# Patient Record
Sex: Female | Born: 1950 | Race: White | Hispanic: No | Smoking: Former smoker
Health system: Southern US, Community
[De-identification: ages and names within clinical notes are randomized; demographics above are authoritative.]

## PROBLEM LIST (undated history)

## (undated) DIAGNOSIS — IMO0001 Reserved for inherently not codable concepts without codable children: Secondary | ICD-10-CM

## (undated) DIAGNOSIS — N83201 Unspecified ovarian cyst, right side: Secondary | ICD-10-CM

## (undated) DIAGNOSIS — C7951 Secondary malignant neoplasm of bone: Secondary | ICD-10-CM

## (undated) DIAGNOSIS — S76019A Strain of muscle, fascia and tendon of unspecified hip, initial encounter: Secondary | ICD-10-CM

## (undated) DIAGNOSIS — K219 Gastro-esophageal reflux disease without esophagitis: Secondary | ICD-10-CM

## (undated) DIAGNOSIS — M1990 Unspecified osteoarthritis, unspecified site: Secondary | ICD-10-CM

## (undated) DIAGNOSIS — N83202 Unspecified ovarian cyst, left side: Secondary | ICD-10-CM

## (undated) DIAGNOSIS — C801 Malignant (primary) neoplasm, unspecified: Secondary | ICD-10-CM

## (undated) DIAGNOSIS — IMO0002 Reserved for concepts with insufficient information to code with codable children: Secondary | ICD-10-CM

## (undated) DIAGNOSIS — Z5111 Encounter for antineoplastic chemotherapy: Secondary | ICD-10-CM

## (undated) HISTORY — PX: TUBAL LIGATION: SHX77

## (undated) HISTORY — DX: Encounter for antineoplastic chemotherapy: Z51.11

## (undated) HISTORY — DX: Gastro-esophageal reflux disease without esophagitis: K21.9

## (undated) HISTORY — DX: Unspecified ovarian cyst, left side: N83.202

## (undated) HISTORY — DX: Unspecified ovarian cyst, right side: N83.201

## (undated) HISTORY — DX: Strain of muscle, fascia and tendon of unspecified hip, initial encounter: S76.019A

## (undated) HISTORY — DX: Unspecified osteoarthritis, unspecified site: M19.90

---

## 1998-11-27 ENCOUNTER — Other Ambulatory Visit: Admission: RE | Admit: 1998-11-27 | Discharge: 1998-11-27 | Payer: Self-pay | Admitting: Obstetrics & Gynecology

## 1999-09-19 ENCOUNTER — Other Ambulatory Visit: Admission: RE | Admit: 1999-09-19 | Discharge: 1999-09-19 | Payer: Self-pay | Admitting: Obstetrics & Gynecology

## 2000-09-24 ENCOUNTER — Other Ambulatory Visit: Admission: RE | Admit: 2000-09-24 | Discharge: 2000-09-24 | Payer: Self-pay | Admitting: Obstetrics & Gynecology

## 2001-09-29 ENCOUNTER — Other Ambulatory Visit: Admission: RE | Admit: 2001-09-29 | Discharge: 2001-09-29 | Payer: Self-pay | Admitting: Obstetrics & Gynecology

## 2002-03-23 ENCOUNTER — Encounter: Admission: RE | Admit: 2002-03-23 | Discharge: 2002-03-23 | Payer: Self-pay | Admitting: Rheumatology

## 2002-03-23 ENCOUNTER — Encounter: Payer: Self-pay | Admitting: Rheumatology

## 2002-04-15 HISTORY — PX: DILATION AND CURETTAGE OF UTERUS: SHX78

## 2002-11-02 ENCOUNTER — Other Ambulatory Visit: Admission: RE | Admit: 2002-11-02 | Discharge: 2002-11-02 | Payer: Self-pay | Admitting: Obstetrics and Gynecology

## 2002-11-09 ENCOUNTER — Encounter (INDEPENDENT_AMBULATORY_CARE_PROVIDER_SITE_OTHER): Payer: Self-pay | Admitting: *Deleted

## 2002-11-09 ENCOUNTER — Ambulatory Visit (HOSPITAL_COMMUNITY): Admission: RE | Admit: 2002-11-09 | Discharge: 2002-11-09 | Payer: Self-pay | Admitting: Obstetrics and Gynecology

## 2003-11-15 ENCOUNTER — Other Ambulatory Visit: Admission: RE | Admit: 2003-11-15 | Discharge: 2003-11-15 | Payer: Self-pay | Admitting: Obstetrics and Gynecology

## 2004-03-28 ENCOUNTER — Ambulatory Visit: Payer: Self-pay | Admitting: Gastroenterology

## 2004-04-05 ENCOUNTER — Ambulatory Visit: Payer: Self-pay | Admitting: Gastroenterology

## 2004-04-05 HISTORY — PX: COLONOSCOPY: SHX174

## 2005-02-19 ENCOUNTER — Other Ambulatory Visit: Admission: RE | Admit: 2005-02-19 | Discharge: 2005-02-19 | Payer: Self-pay | Admitting: Obstetrics and Gynecology

## 2006-03-18 ENCOUNTER — Other Ambulatory Visit: Admission: RE | Admit: 2006-03-18 | Discharge: 2006-03-18 | Payer: Self-pay | Admitting: Obstetrics & Gynecology

## 2007-03-27 ENCOUNTER — Other Ambulatory Visit: Admission: RE | Admit: 2007-03-27 | Discharge: 2007-03-27 | Payer: Self-pay | Admitting: Obstetrics and Gynecology

## 2007-06-09 ENCOUNTER — Encounter: Admission: RE | Admit: 2007-06-09 | Discharge: 2007-06-09 | Payer: Self-pay | Admitting: Family Medicine

## 2008-03-28 ENCOUNTER — Other Ambulatory Visit: Admission: RE | Admit: 2008-03-28 | Discharge: 2008-03-28 | Payer: Self-pay | Admitting: Obstetrics & Gynecology

## 2010-08-31 NOTE — Op Note (Signed)
   NAME:  Sherri Stafford, Sherri Stafford                             ACCOUNT NO.:  [id number]   MEDICAL RECORD NO.:  [id number]                   PATIENT TYPE:  AMB   LOCATION:  SDC                                  FACILITY:  [facility]   PHYSICIAN:  Cynthia P. Romine, M.D.             DATE OF BIRTH:  [birth year]   DATE OF PROCEDURE:  11/09/2002  DATE OF DISCHARGE:                                 OPERATIVE REPORT   PREOPERATIVE DIAGNOSIS:  Abnormal uterine bleeding with known endometrial  polyp.   POSTOPERATIVE DIAGNOSIS:  Abnormal uterine bleeding with known endometrial  polyp, pathology pending.   PROCEDURES:  1. Hysteroscopic resection of endometrial polyp.  2. Dilatation and curettage.   SURGEON:  Cynthia P. Romine, M.D.   ANESTHESIA:  General by mask.   ESTIMATED BLOOD LOSS:  Minimal.   SORBITOL DEFICIT:  90 mL.   COMPLICATIONS:  None.   PROCEDURE:  The patient was taken to the operating room and after the  induction of adequate general anesthesia by mask, she was placed in the  dorsal lithotomy position and was prepped and draped in the usual fashion.  The bladder was drained with a red rubber catheter.  The cervix was grasped  on the anterior lip with a single-tooth tenaculum.  A uterine sound was done  to 8 cm.  The cervix was dilated to a #25 Shawnie Pons.  The diagnostic scope was  introduced.  The endometrial polyp was seen and photographic documentation.  The diagnostic scope was removed, the cervix was dilated to a #31 Shawnie Pons, and  the operative scope was introduced.  Sorbitol was used as a distention  medium.  A single loop was used to remove the polyp with cautery.  It was  sent to pathology.  The scope was removed.  Sharp curettage was then carried  out and the specimen also sent to pathology.  The scope was reinserted.  The  endometrium appeared clean.  Photographic documentation was taken and the  procedure was terminated.  The instruments were removed from the vagina and  the patient was  taken in satisfactory condition to postanesthesia recovery.                                               Cynthia P. Romine, M.D.    CPR/MEDQ  D:  11/09/2002  T:  11/09/2002  Job:  [id number]

## 2010-09-11 ENCOUNTER — Encounter: Payer: Self-pay | Admitting: Gastroenterology

## 2010-10-08 ENCOUNTER — Telehealth: Payer: Self-pay | Admitting: Gastroenterology

## 2010-10-08 NOTE — Telephone Encounter (Signed)
Pt states that she had some abnormal liver functions on her labwork and that Dr. Tresa Res wanted her to be seen by Dr. Arlyce Dice. Have called Dr. Harlene Salts office and requested the labs. Waiting to hear back from their office.

## 2010-10-09 NOTE — Telephone Encounter (Signed)
Left message for pt to call back  °

## 2010-10-10 NOTE — Telephone Encounter (Signed)
Pt scheduled to see Dr. Arlyce Dice 11/05/10@10 :45am. Pt aware of appt date and time.

## 2010-11-05 ENCOUNTER — Encounter: Payer: Self-pay | Admitting: Gastroenterology

## 2010-11-05 ENCOUNTER — Ambulatory Visit (INDEPENDENT_AMBULATORY_CARE_PROVIDER_SITE_OTHER): Payer: BC Managed Care – PPO | Admitting: Gastroenterology

## 2010-11-05 DIAGNOSIS — R945 Abnormal results of liver function studies: Secondary | ICD-10-CM

## 2010-11-05 DIAGNOSIS — R109 Unspecified abdominal pain: Secondary | ICD-10-CM

## 2010-11-05 NOTE — Progress Notes (Signed)
History of Present Illness:  Sherri Stafford is a pleasant 60 year old white female referred at the request of Dr. Tresa Res for evaluation of abnormal liver tests. On routine testing in March and April, 2012 bilirubin was 1.5-2.0. In March the indirect was 1.2 with a total of 1.5. She has no history of liver disease including hepatitis or jaundice. She does not drink. Family history is negative for liver disease.    Review of Systems: Pertinent positive and negative review of systems were noted in the above HPI section. All other review of systems were otherwise negative.    Current Medications, Allergies, Past Medical History, Past Surgical History, Family History and Social History were reviewed in [employer] [facility] electronic medical record  Vital signs were reviewed in today's medical record. Physical Exam: General: Well developed , well nourished, no acute distress Head: Normocephalic and atraumatic Eyes:  sclerae anicteric, EOMI Ears: Normal auditory acuity Mouth: No deformity or lesions Lungs: Clear throughout to auscultation Heart: Regular rate and rhythm; no murmurs, rubs or bruits Abdomen: Soft, non tender and non distended. No masses, hepatosplenomegaly or hernias noted. Normal Bowel sounds Rectal:deferred Musculoskeletal: Symmetrical with no gross deformities  Pulses:  Normal pulses noted Extremities: No clubbing, cyanosis, edema or deformities noted Neurological: Alert oriented x 4, grossly nonfocal Psychological:  Alert and cooperative. Normal mood and affect

## 2010-11-05 NOTE — Assessment & Plan Note (Signed)
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

## 2010-11-05 NOTE — Patient Instructions (Signed)
Your Abdominal Ultrasound is scheduled on 11/07/2010 at 8:45am at [facility] Nothing to eat or drink 6 hours prior to test

## 2010-11-06 ENCOUNTER — Encounter: Payer: Self-pay | Admitting: Gastroenterology

## 2010-11-07 ENCOUNTER — Other Ambulatory Visit (HOSPITAL_COMMUNITY): Payer: BC Managed Care – PPO

## 2010-11-09 ENCOUNTER — Ambulatory Visit (HOSPITAL_COMMUNITY)
Admission: RE | Admit: 2010-11-09 | Discharge: 2010-11-09 | Disposition: A | Discharge status: Home / Self Care | Payer: BC Managed Care – PPO | Source: Ambulatory Visit | Attending: Gastroenterology | Admitting: Gastroenterology

## 2010-11-09 DIAGNOSIS — R109 Unspecified abdominal pain: Secondary | ICD-10-CM | POA: Insufficient documentation

## 2010-11-09 DIAGNOSIS — K824 Cholesterolosis of gallbladder: Secondary | ICD-10-CM | POA: Insufficient documentation

## 2010-11-12 ENCOUNTER — Telehealth: Payer: Self-pay | Admitting: Gastroenterology

## 2010-11-12 NOTE — Telephone Encounter (Signed)
Pt aware of results per Dr. Kaplan 

## 2010-11-12 NOTE — Telephone Encounter (Signed)
Message copied by Michele Mcalpine on Mon Nov 12, 2010  8:32 AM ------      Message from: Melvia Heaps MD D      Created: Sun Nov 11, 2010  5:50 PM       Inform pt that ultrasound does not show any significant findings.  No further w/u

## 2011-04-03 ENCOUNTER — Other Ambulatory Visit: Payer: Self-pay | Admitting: Otolaryngology

## 2011-04-03 DIAGNOSIS — R42 Dizziness and giddiness: Secondary | ICD-10-CM

## 2011-04-04 ENCOUNTER — Ambulatory Visit
Admission: RE | Admit: 2011-04-04 | Discharge: 2011-04-04 | Disposition: A | Discharge status: Home / Self Care | Payer: BC Managed Care – PPO | Source: Ambulatory Visit | Attending: Otolaryngology | Admitting: Otolaryngology

## 2011-04-04 DIAGNOSIS — R42 Dizziness and giddiness: Secondary | ICD-10-CM

## 2011-04-04 MED ORDER — GADOBENATE DIMEGLUMINE 529 MG/ML IV SOLN
12.0000 mL | Freq: Once | INTRAVENOUS | Status: AC | PRN
  Administered 2011-04-04: 12 mL via INTRAVENOUS

## 2011-10-07 ENCOUNTER — Telehealth: Payer: Self-pay | Admitting: Gastroenterology

## 2011-10-07 NOTE — Telephone Encounter (Signed)
Forward to Dr. Melvia Heaps for review on 10-07-11 ym

## 2013-05-25 ENCOUNTER — Telehealth: Payer: Self-pay | Admitting: Nurse Practitioner

## 2013-05-25 NOTE — Telephone Encounter (Signed)
LMTCB

## 2013-05-25 NOTE — Telephone Encounter (Addendum)
Dr. 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 What are your thoughts about having her come in today at 4 to see you? Her next Aex is 06-10-13. Paper chart on your desk.

## 2013-05-25 NOTE — Telephone Encounter (Signed)
Spoke with pt who is at work today. Pt noticed the first labial cyst about a week ago and was hoping "it would just go away." Pt noticed a second one last night. They appear to be "angry hard red knots." Pt has a history of sebaceous cysts on her face and trunk back in the 1980's that had to be cut out, and she thinks these are the same thing. No fever, no drainage so far, discomfort is a 4 or 5 out of 10. 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 Pt requesting appt on Friday if possible. Sched OV with TL 05-28-13 at 1 pm.

## 2013-05-25 NOTE — Telephone Encounter (Signed)
Pt states she has some sebaceous cysts on her labia that are hard and painful. She believes they may need to be cut out. 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

## 2013-05-28 ENCOUNTER — Ambulatory Visit (INDEPENDENT_AMBULATORY_CARE_PROVIDER_SITE_OTHER): Payer: BC Managed Care – PPO | Admitting: Gynecology

## 2013-05-28 ENCOUNTER — Encounter: Payer: Self-pay | Admitting: Gynecology

## 2013-05-28 VITALS — BP 149/88 | HR 90 | Resp 18 | Ht 65.0 in | Wt 159.0 lb

## 2013-05-28 DIAGNOSIS — N9489 Other specified conditions associated with female genital organs and menstrual cycle: Secondary | ICD-10-CM

## 2013-05-28 DIAGNOSIS — N907 Vulvar cyst: Secondary | ICD-10-CM

## 2013-05-28 MED ORDER — LIDOCAINE HCL 2 % EX GEL: 1.0000 "application " | CUTANEOUS | Status: DC | PRN

## 2013-05-28 NOTE — Patient Instructions (Addendum)
Sitz baths with cool water over the weekend TID Pat dry or blow dry Pelvic rest Lidocaine jelly as needed NSAIDS

## 2013-05-28 NOTE — Progress Notes (Signed)
Pt here reporting labial cysts that she first noticed 3w ago.  They are bothersome, get irritated when she wears pants.  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  Pt has a history of sebaceous cysts in other dermal areas and has had them removed from her face, forehead, chin, ear, and eye.  She is interested in having them removed.  ROS: per HPI BP 149/88  Pulse 90  Resp 18  Ht 5\' 5"  (1.651 m)  Wt 159 lb (72.122 kg)  BMI 26.46 kg/m2 General appearance: alert, cooperative and appears stated age   Procedure: After informed consent the labia minora on the right was cleansed with betadine x3, lidocaine jelly placed, then injected with 0.2cc 2% lidocaine.  Cyst stabilized medially and the skin incised, yellow sebaceous material extruded, the cyst wall was sharply removed and the skin closed with 4.0 vicryl interrupted.  The left labia was treated similarly.  The cyst was excised and skin closed. Pt tolerated well. She will do sitz baths at home, lidocaine jelly as needed, NSAIDS.   F/u 1w for suture removal No pathology

## 2013-06-01 ENCOUNTER — Encounter: Payer: Self-pay | Admitting: Gynecology

## 2013-06-01 ENCOUNTER — Ambulatory Visit (INDEPENDENT_AMBULATORY_CARE_PROVIDER_SITE_OTHER): Payer: BC Managed Care – PPO | Admitting: Gynecology

## 2013-06-01 VITALS — BP 126/84 | HR 88 | Resp 14 | Ht 65.0 in | Wt 158.0 lb

## 2013-06-01 DIAGNOSIS — N907 Vulvar cyst: Secondary | ICD-10-CM

## 2013-06-01 DIAGNOSIS — N9489 Other specified conditions associated with female genital organs and menstrual cycle: Secondary | ICD-10-CM

## 2013-06-01 DIAGNOSIS — Z4802 Encounter for removal of sutures: Secondary | ICD-10-CM

## 2013-06-01 NOTE — Progress Notes (Signed)
Pt here for suture removal.  Pt using sitz baths with relief, notes some itching at suture site.  No other complaints. ROS: per HPI PE: BP 126/84  Pulse 88  Resp 14  Ht 5\' 5"  (1.651 m)  Wt 158 lb (71.668 kg)  BMI 26.29 kg/m2 General appearance: alert, cooperative and appears stated age Pelvic: incision sites intact with suture, no induration or warmth, no residual cystic mass palpated  A/P: Sebaceous cysts removed Sutures removed, area treated with lidocaine jelly, sutres cut and removed intact, area nontender, no mass

## 2013-06-04 ENCOUNTER — Ambulatory Visit: Payer: BC Managed Care – PPO | Admitting: Gynecology

## 2013-06-09 ENCOUNTER — Telehealth: Payer: Self-pay | Admitting: Nurse Practitioner

## 2013-06-09 MED ORDER — ESTRADIOL-NORETHINDRONE ACET 1-0.5 MG PO TABS: ORAL_TABLET | ORAL | Status: DC

## 2013-06-09 NOTE — Telephone Encounter (Signed)
Due to inclement weather and AEX reschedule with Dr. Charlies Constable 06/10/13, patient requests a refill on her "hormone pills." (Patient does not remember the name of her RX.) Patient's AEX now scheduled for 07/12/13 and she needs refills to last until then.  "Use the same pharmacy as last time." Patient does not remember the name of her mail order pharmacy where she requests this refill be sent.

## 2013-06-09 NOTE — Telephone Encounter (Signed)
Last refilled: AEX 06/08/12 #90/3 refills by Ms.Patty AEX Scheduled: 07/12/13  Last Mammogram: 05/24/13 Bi-Rads 1  Activella [facility]90/[facility] to last patient until AEX  (she gets 90 days at a time)  LM on patient's VM that rx has been and if she has any issues she can give [place] a call.  Routed to provider, encounter closed.

## 2013-06-10 ENCOUNTER — Ambulatory Visit: Payer: Self-pay | Admitting: Nurse Practitioner

## 2013-06-11 MED ORDER — ESTRADIOL-NORETHINDRONE ACET 1-0.5 MG PO TABS: ORAL_TABLET | ORAL | Status: DC

## 2013-06-11 NOTE — Telephone Encounter (Signed)
Request received from [facility].  [facility] on 06/09/13. RX resent today to [facility].

## 2013-06-11 NOTE — Addendum Note (Signed)
Addended by: Graylon Good on: 06/11/2013 03:21 PM   Modules accepted: Orders

## 2013-06-13 DIAGNOSIS — S76019A Strain of muscle, fascia and tendon of unspecified hip, initial encounter: Secondary | ICD-10-CM

## 2013-06-13 HISTORY — DX: Strain of muscle, fascia and tendon of unspecified hip, initial encounter: S76.019A

## 2013-06-24 ENCOUNTER — Encounter: Payer: Self-pay | Admitting: Internal Medicine

## 2013-06-24 ENCOUNTER — Telehealth: Payer: Self-pay | Admitting: *Deleted

## 2013-06-24 ENCOUNTER — Ambulatory Visit (INDEPENDENT_AMBULATORY_CARE_PROVIDER_SITE_OTHER)
Admission: RE | Admit: 2013-06-24 | Discharge: 2013-06-24 | Disposition: A | Discharge status: Home / Self Care | Payer: BC Managed Care – PPO | Source: Ambulatory Visit | Attending: Internal Medicine | Admitting: Internal Medicine

## 2013-06-24 ENCOUNTER — Ambulatory Visit (INDEPENDENT_AMBULATORY_CARE_PROVIDER_SITE_OTHER): Payer: BC Managed Care – PPO | Admitting: Internal Medicine

## 2013-06-24 VITALS — BP 128/70 | HR 86 | Temp 98.4°F | Ht 63.5 in | Wt 160.6 lb

## 2013-06-24 DIAGNOSIS — R059 Cough, unspecified: Secondary | ICD-10-CM

## 2013-06-24 DIAGNOSIS — D869 Sarcoidosis, unspecified: Secondary | ICD-10-CM | POA: Insufficient documentation

## 2013-06-24 DIAGNOSIS — R05 Cough: Secondary | ICD-10-CM | POA: Insufficient documentation

## 2013-06-24 MED ORDER — ESOMEPRAZOLE MAGNESIUM 40 MG PO PACK: 40.0000 mg | PACK | Freq: Every day | ORAL | Status: DC

## 2013-06-24 MED ORDER — PREDNISONE 10 MG PO TABS: ORAL_TABLET | ORAL | Status: DC

## 2013-06-24 MED ORDER — PROMETHAZINE-CODEINE 6.25-10 MG/5ML PO SYRP: 5.0000 mL | ORAL_SOLUTION | ORAL | Status: DC | PRN

## 2013-06-24 NOTE — Telephone Encounter (Signed)
Aware, reviewed
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
## 2013-06-24 NOTE — Progress Notes (Signed)
   Subjective:    Patient ID: Sherri Stafford, female    DOB: 20-Jul-1950  MRN: [id number]  HPI   54 yowf retired Psychologist, prison and probation services quit smoking in 1986 dx of sarcoid clinically in 2004 assoc with arthritis no rash rx short course prednisone and no bx by Internist (doesn't remember name)  Referred  To pulmonary clinic for cough 06/24/2013 by Dr Redmond Baseman.    06/24/2013 1st [facility] office visit/ Wert cc "always get bronchitis" since childhood "they thought I had whooping coughs multiple times"  Typically required narcotic resolves p 3-4 weeks s prednisone or inhaler.  This episode  acute onset  X 5 weeks while sipping diet coke then became gradually worse and better p cough meds per Arelia Sneddon, seen by Dr Redmond Baseman 06/18/13 neg eval with dx of gerd  No obvious other patterns in day to day or daytime variabilty or assoc chronic cough or cp or chest tightness, subjective wheeze overt sinus or hb symptoms. No unusual exp hx or h/o childhood pna/ asthma or knowledge of premature birth.  Sleeping ok without nocturnal  or early am exacerbation  of respiratory  c/o's or need for noct saba. Also denies any obvious fluctuation of symptoms with weather or environmental changes or other aggravating or alleviating factors except as outlined above   Current Medications, Allergies, Complete Past Medical History, Past Surgical History, Family History, and Social History were reviewed in [employer] record.             Review of Systems  Constitutional: Negative for fever, chills and unexpected weight change.  HENT: Negative for congestion, dental problem, ear pain, nosebleeds, postnasal drip, rhinorrhea, sinus pressure, sneezing, sore throat, trouble swallowing and voice change.   Eyes: Negative for visual disturbance.  Respiratory: Positive for cough. Negative for choking and shortness of breath.   Cardiovascular: Negative for chest pain and leg swelling.  Gastrointestinal: Negative for  vomiting, abdominal pain and diarrhea.  Genitourinary: Negative for difficulty urinating.  Musculoskeletal: Negative for arthralgias.  Skin: Negative for rash.  Neurological: Negative for tremors, syncope and headaches.  Hematological: Does not bruise/bleed easily.       Objective:   Physical Exam  . Wt Readings from Last 3 Encounters:  06/24/13 160 lb 9.6 oz (72.848 kg)  06/01/13 158 lb (71.668 kg)  05/28/13 159 lb (72.122 kg)      HEENT: nl dentition, turbinates, and orophanx. Nl external ear canals without cough reflex   NECK :  without JVD/Nodes/TM/ nl carotid upstrokes bilaterally   LUNGS: no acc muscle use, clear to A and P bilaterally without cough on insp or exp maneuvers   CV:  RRR  no s3 or murmur or increase in P2, no edema   ABD:  soft and nontender with nl excursion in the supine position. No bruits or organomegaly, bowel sounds nl  MS:  warm without deformities, calf tenderness, cyanosis or clubbing  SKIN: warm and dry without lesions    NEURO:  alert, approp, no deficits    CXR  06/24/2013 :  1. Innumerable bilateral pulmonary parenchymal masses are present  worrisome for metastatic disease.  2. Mild hyperinflation is present consistent with COPD.        Assessment & Plan:

## 2013-06-24 NOTE — Assessment & Plan Note (Signed)
-   dx 2004 no bx/ assoc with arthritis, short course prednisone only   Most likely this is what we're seeing now on cxr but no baseline available so f/u in 6 weeks rec

## 2013-06-24 NOTE — Assessment & Plan Note (Signed)

## 2013-06-24 NOTE — Progress Notes (Signed)
Quick Note:  Spoke with the pt and notified of results/recs per MW She has questions about sarcoid and wants MW to call her tomorrow 05/28/13  Please call her, thanks ______

## 2013-06-24 NOTE — Patient Instructions (Addendum)
Please remember to go to the  x-ray department downstairs for your tests - we will call you with the results when they are available.  Prednisone 10 mg take  4 each am x 2 days,   2 each am x 2 days,  1 each am x 2 days and stop   The key to effective treatment for your cough is eliminating the non-stop cycle of cough you're stuck in long enough to let your airway heal completely and then see if there is anything still making you cough once you stop the cough suppression, but this should take no more than 5 days to figure out   Nexium  40 mg   Take 30-60 min before first meal of the day and Pepcid 20 mg one bedtime and chlortrimeton 4 mg at bedtime  until return to office - this is the best way to tell whether stomach acid is contributing to your problem.    GERD (REFLUX)  is an extremely common cause of respiratory symptoms, many times with no significant heartburn at all.    It can be treated with medication, but also with lifestyle changes including avoidance of late meals, excessive alcohol, smoking cessation, and avoid fatty foods, chocolate, peppermint, colas, red wine, and acidic juices such as orange juice.  NO MINT OR MENTHOL PRODUCTS SO NO COUGH DROPS  USE SUGARLESS CANDY INSTEAD (jolley ranchers or Stover's)  NO OIL BASED VITAMINS - use powdered substitutes.    Return in 2 weeks if not better

## 2013-06-28 ENCOUNTER — Telehealth: Payer: Self-pay | Admitting: Internal Medicine

## 2013-06-28 NOTE — Telephone Encounter (Signed)
Discussed with pt

## 2013-06-28 NOTE — Telephone Encounter (Signed)
Magda Paganini spoke with pt on 06/24/13 and advised of cxr results.  Pt requested to speak with Dr Melvyn Novas for further question.  Dr Melvyn Novas can you call pt?

## 2013-06-30 ENCOUNTER — Telehealth: Payer: Self-pay | Admitting: Gastroenterology

## 2013-06-30 ENCOUNTER — Telehealth: Payer: Self-pay | Admitting: Internal Medicine

## 2013-06-30 DIAGNOSIS — K219 Gastro-esophageal reflux disease without esophagitis: Secondary | ICD-10-CM

## 2013-06-30 MED ORDER — PROMETHAZINE-CODEINE 6.25-10 MG/5ML PO SYRP: 5.0000 mL | ORAL_SOLUTION | ORAL | Status: DC | PRN

## 2013-06-30 NOTE — Telephone Encounter (Signed)
She can try prn gaviscon but will need GI eval> ok to refre    - likely the hormone tablets are contributing to symptoms and may need to stop them.  - the phenergan with codeine should treat pain and cough both.

## 2013-06-30 NOTE — Telephone Encounter (Signed)
Spoke with pt - She states she is taking Nexium 30 minutes before breakfast and Pepcid at bedtime.  This was helping until this am and pt having increased reflux and feels like she is on fire.  Please advise

## 2013-06-30 NOTE — Telephone Encounter (Signed)
appt scheduled

## 2013-06-30 NOTE — Telephone Encounter (Signed)
Spoke with pt and advised of Dr Gustavus Bryant recommendations.  Pt will try Gaviscon otc.  Pt requests to see Dr Deatra Ina because she has seen him for other issues in the past.  Referral placed.  Pt also requests refill on Phenergan with Codeine .  Refill called in

## 2013-06-30 NOTE — Telephone Encounter (Signed)
Please schedule this pt with a midlevel next week.

## 2013-07-01 ENCOUNTER — Encounter: Payer: Self-pay | Admitting: Nurse Practitioner

## 2013-07-02 ENCOUNTER — Encounter: Payer: Self-pay | Admitting: *Deleted

## 2013-07-06 ENCOUNTER — Encounter: Payer: Self-pay | Admitting: Nurse Practitioner

## 2013-07-06 ENCOUNTER — Ambulatory Visit (INDEPENDENT_AMBULATORY_CARE_PROVIDER_SITE_OTHER): Payer: BC Managed Care – PPO | Admitting: Nurse Practitioner

## 2013-07-06 VITALS — BP 118/68 | HR 74 | Ht 63.5 in | Wt 156.4 lb

## 2013-07-06 DIAGNOSIS — R05 Cough: Secondary | ICD-10-CM

## 2013-07-06 DIAGNOSIS — R059 Cough, unspecified: Secondary | ICD-10-CM

## 2013-07-06 MED ORDER — PROMETHAZINE-CODEINE 6.25-10 MG/5ML PO SYRP: 5.0000 mL | ORAL_SOLUTION | Freq: Two times a day (BID) | ORAL | Status: DC

## 2013-07-06 MED ORDER — LORAZEPAM 0.5 MG PO TABS: 0.5000 mg | ORAL_TABLET | Freq: Two times a day (BID) | ORAL | Status: DC

## 2013-07-06 NOTE — Progress Notes (Signed)
HPI :   Patient is a 63 year old female known remotely to Dr. Deatra Ina. She is here for evaluation of cough felt to be related to GERD. In mid February patient" choked"  or a soda, since then she has had a dry cough. PCP referred her to ENT but according to the patient, the evaluation was unremarkable. It sounds like she had a laryngoscopy, no evidence for GERD or any other abnormalities found. The patient was instructed to take daily Nexium (she had taken a PPI only on an as-needed basis up until that point). She has also added in a Pepcid at bedtime. ENT referred patient to pulmonary care, she was seen by Dr. Melvyn Novas. Patient does have a history of sarcoidosis  Pulmonary gave her short course of prednisone. Patient has been taking cough syrup with codeine which significantly improves her dry cough. Patient sleeps with the head of her bed elevated. She is not really having much in the way of heartburn though some bone nodes to pulmonary a few days ago mention that Patient was having increased reflux and "on fire"     Past Medical History  Diagnosis Date  . Arthritis   . Bilateral ovarian cysts   . GERD (gastroesophageal reflux disease)     Family History  Problem Relation Age of Onset  . Brain cancer Father   . Pancreatic cancer Mother   . Emphysema Father     smoked   History  Substance Use Topics  . Smoking status: Former Smoker -- 1.00 packs/day for 14 years    Types: Cigarettes    Quit date: 04/15/1984  . Smokeless tobacco: Never Used  . Alcohol Use: No   Current Outpatient Prescriptions  Medication Sig Dispense Refill  . esomeprazole (NEXIUM) 40 MG packet Take 40 mg by mouth daily before breakfast.  30 each  12  . meloxicam (MOBIC) 15 MG tablet as needed.       . norethindrone-ethinyl estradiol (FEMHRT 1/5) 1-5 MG-MCG TABS Take 1 tablet by mouth daily.      . predniSONE (DELTASONE) 10 MG tablet Take  4 each am x 2 days,   2 each am x 2 days,  1 each am x 2 days and stop  14  tablet  0  . promethazine-codeine (PHENERGAN WITH CODEINE) 6.25-10 MG/5ML syrup Take 5 mLs by mouth 2 (two) times daily.       No current facility-administered medications for this visit.   Allergies  Allergen Reactions  . Ketoconazole   . Nystatin Hives  . Sporanox [Itraconazole] Hives   Review of Systems: All systems reviewed and negative except where noted in HPI.    Dg Chest 2 View  06/24/2013   CLINICAL DATA:  Five week history of cough.  EXAM: CHEST  2 VIEW  COMPARISON:  None.  FINDINGS: The lungs are well-expanded. There are multiple bilateral pulmonary parenchymal masses of varying sizes consistent with metastatic malignancy. The cardiac silhouette is normal in size. The pulmonary vascularity is not engorged. The mediastinum is normal in width. There is no pleural effusion. The observed portions of the bony thorax exhibit no acute abnormalities.  IMPRESSION: 1. Innumerable bilateral pulmonary parenchymal masses are present worrisome for metastatic disease. 2. Mild hyperinflation is present consistent with COPD. 3. There is no evidence of a pleural effusion or pneumothorax or CHF. 4. These results were called by telephone at the time of interpretation on 06/24/2013 at 11:07 AM to  Bing, RN, in Dr. Gustavus Bryant office, who verbally  acknowledged these results.   Electronically Signed   By: David  Martinique   On: 06/24/2013 11:09    Physical Exam: BP 118/68  Pulse 74  Ht 5' 3.5" (1.613 m)  Wt 156 lb 6.4 oz (70.943 kg)  BMI 27.27 kg/m2 Constitutional: Pleasant,well-developed, white female in no acute distress. HEENT: Normocephalic and atraumatic. Conjunctivae are normal. No scleral icterus. Neck supple.  Cardiovascular: Normal rate, regular rhythm.  Pulmonary/chest: Effort normal and breath sounds normal. No wheezing, rales or rhonchi. Abdominal: Soft, nondistended, nontender. Bowel sounds active throughout. There are no masses palpable. No hepatomegaly. Extremities: no  edema Lymphadenopathy: No cervical adenopathy noted. Neurological: Alert and oriented to person place and time. Skin: Skin is warm and dry. No rashes noted. Psychiatric: Normal mood and affect. Behavior is normal.   ASSESSMENT AND PLAN:   63 year old female with 6 week history of dry cough evaluated by ENT as well as pulmonary. Now here for workup of GERD as cause for cough. Patient PPI as well as Pepcid at bedtime.  She takes cough syrup with codeine which really helps cough. She has been under stress lately, wonders if dry cough may be a consequence of that . To help sort things out I have asked the patient to continue daily PPI and her H2 blocker but try and refrain from using the codeine cough syrup so cough will not be masked . If cough recurs then she will try a low-dose Ativan to see if there is an anxiety / stress component to the cough.  If cough persists despite these measures then may need pH study or possible Bravo. She will call with a condition update in a couple of weeks.

## 2013-07-06 NOTE — Patient Instructions (Signed)
Continue the Nexium 40 mg every morning. Continue the Pepcid at bedtime. We will fax the refill for the codeine cough syrup but try to hold it. If coughing returns take 1 tab of Ativen twice daily.   If coughing is better call us in 2-3 weeks with a condition update.

## 2013-07-07 ENCOUNTER — Encounter: Payer: Self-pay | Admitting: Nurse Practitioner

## 2013-07-12 ENCOUNTER — Ambulatory Visit (INDEPENDENT_AMBULATORY_CARE_PROVIDER_SITE_OTHER): Payer: BC Managed Care – PPO | Admitting: Nurse Practitioner

## 2013-07-12 ENCOUNTER — Encounter: Payer: Self-pay | Admitting: Nurse Practitioner

## 2013-07-12 VITALS — BP 100/64 | HR 88 | Ht 65.0 in | Wt 158.0 lb

## 2013-07-12 DIAGNOSIS — Z79890 Hormone replacement therapy (postmenopausal): Secondary | ICD-10-CM

## 2013-07-12 DIAGNOSIS — E559 Vitamin D deficiency, unspecified: Secondary | ICD-10-CM | POA: Insufficient documentation

## 2013-07-12 DIAGNOSIS — Z0000 Encounter for general adult medical examination without abnormal findings: Secondary | ICD-10-CM

## 2013-07-12 DIAGNOSIS — Z01419 Encounter for gynecological examination (general) (routine) without abnormal findings: Secondary | ICD-10-CM

## 2013-07-12 LAB — POCT URINALYSIS DIPSTICK
Bilirubin, UA: NEGATIVE
Blood, UA: NEGATIVE
GLUCOSE UA: NEGATIVE
KETONES UA: NEGATIVE
Leukocytes, UA: NEGATIVE
Nitrite, UA: NEGATIVE
Protein, UA: NEGATIVE
UROBILINOGEN UA: NEGATIVE
pH, UA: 5

## 2013-07-12 LAB — HEMOGLOBIN, FINGERSTICK: HEMOGLOBIN, FINGERSTICK: 14 g/dL (ref 12.0–16.0)

## 2013-07-12 MED ORDER — NORETHINDRONE-ETH ESTRADIOL 1-5 MG-MCG PO TABS: 1.0000 | ORAL_TABLET | Freq: Every day | ORAL | Status: DC

## 2013-07-12 NOTE — Patient Instructions (Signed)

## 2013-07-12 NOTE — Progress Notes (Signed)
Patient ID: Sherri Stafford, female   DOB: [birth year], 63 y.o.   MRN: [id number] 63 y.o. G0P0 Divorced Caucasian Fe here for annual exam.  Patient and ex husband are now back together.  They had divorced secondary to his ETOH abuse.  He has been sober for a year and they are trying to work things out.   She does have some vaginal dryness but able to use OTC lubrication.  She is on steroids currently for a chronic cough and is seeing a pulmonologist.  Patient's last menstrual period was 01/14/2008.          Sexually active: yes  The current method of family planning is post menopausal status.    Exercising: yes  cardio and weights Smoker:  no  Health Maintenance: Pap:  06/06/11, WNL, neg HR HPV MMG:  05/21/13, need additional images in 6 months Colonoscopy:  03/2004, repeat in 10 years BMD:  05/2011, -1.1/-1.8/-1.5 TDaP:  03/15/11 Labs:  HB: 14.0   Urine:  Negative     reports that she quit smoking about 29 years ago. Her smoking use included Cigarettes. She has a 14 pack-year smoking history. She has never used smokeless tobacco. She reports that she does not drink alcohol or use illicit drugs.  Past Medical History  Diagnosis Date  . Arthritis   . Bilateral ovarian cysts   . GERD (gastroesophageal reflux disease)   . Strain of hip flexor 06/2013    left side torn    Past Surgical History  Procedure Laterality Date  . Dilation and curettage of uterus  2004    x 3   . Tubal ligation    . Colonoscopy  04/05/2004    normal     Current Outpatient Prescriptions  Medication Sig Dispense Refill  . esomeprazole (NEXIUM) 40 MG packet Take 40 mg by mouth daily before breakfast.  30 each  12  . meloxicam (MOBIC) 15 MG tablet as needed.       . norethindrone-ethinyl estradiol (FEMHRT 1/5) 1-5 MG-MCG TABS Take 1 tablet by mouth daily.  90 tablet  3  . promethazine-codeine (PHENERGAN WITH CODEINE) 6.25-10 MG/5ML syrup Take 5 mLs by mouth 2 (two) times daily.  120 mL  0  . LORazepam (ATIVAN) 0.5 MG  tablet Take 1 tablet (0.5 mg total) by mouth 2 (two) times daily.  20 tablet  0   No current facility-administered medications for this visit.    Family History  Problem Relation Age of Onset  . Brain cancer Father   . Emphysema Father     smoked  . Pancreatic cancer Mother   . Colon cancer Neg Hx   . Breast cancer Paternal Aunt 56    ROS:  Pertinent items are noted in HPI.  Otherwise, a comprehensive ROS was negative.  Exam:   BP 100/64  Pulse 88  Ht 5\' 5"  (1.651 m)  Wt 158 lb (71.668 kg)  BMI 26.29 kg/m2  LMP 01/14/2008 Height: 5\' 5"  (165.1 cm) (with shoes, pt unable to take off due to hip injury)  Ht Readings from Last 3 Encounters:  07/12/13 5\' 5"  (1.651 m)  07/06/13 5' 3.5" (1.613 m)  06/24/13 5' 3.5" (1.613 m)    General appearance: alert, cooperative and appears stated age Head: Normocephalic, without obvious abnormality, atraumatic Neck: no adenopathy, supple, symmetrical, trachea midline and thyroid normal to inspection and palpation Lungs: clear to auscultation bilaterally Breasts: normal appearance, no masses or tenderness, no mass on right side, FCB changes Heart:  regular rate and rhythm Abdomen: soft, non-tender; no masses,  no organomegaly Extremities: extremities normal, atraumatic, no cyanosis or edema Skin: Skin color, texture, turgor normal. No rashes or lesions Lymph nodes: Cervical, supraclavicular, and axillary nodes normal. No abnormal inguinal nodes palpated Neurologic: Grossly normal   Pelvic: External genitalia:  no lesions              Urethra:  normal appearing urethra with no masses, tenderness or lesions              Bartholin's and Skene's: normal                 Vagina: normal appearing vagina with normal color and discharge, no lesions              Cervix: anteverted              Pap taken: yes Bimanual Exam:  Uterus:  normal size, contour, position, consistency, mobility, non-tender              Adnexa: no mass, fullness,  tenderness               Rectovaginal: Confirms               Anus:  normal sphincter tone, no lesions  A:  Well Woman with normal exam  Postmenopausal on HRT  History of chronic cough - now on steroids via Pulmonologist. Sarcoidosis  Abnormal Mammo most likely a benign node  P:   Pap smear as per guidelines   Mammogram due again in September with [place] on the right  Refill HRT FEMHRT for a year  Discussed risks of CVA, DVT, cancer, etc.  Also discussed results in relationship with this Mammo which is a node most likely benign-- she will continue monthly breast exams.  Recheck Vit D along with labs and follow  Counseled on breast self exam, mammography screening, adequate intake of calcium and vitamin D, diet and exercise return annually or prn  An After Visit Summary was printed and given to the patient.

## 2013-07-13 ENCOUNTER — Telehealth: Payer: Self-pay | Admitting: *Deleted

## 2013-07-13 LAB — COMPREHENSIVE METABOLIC PANEL
ALT: 8 U/L (ref 0–35)
AST: 10 U/L (ref 0–37)
Albumin: 3.7 g/dL (ref 3.5–5.2)
Alkaline Phosphatase: 51 U/L (ref 39–117)
BILIRUBIN TOTAL: 1.9 mg/dL — AB (ref 0.2–1.2)
BUN: 11 mg/dL (ref 6–23)
CO2: 25 mEq/L (ref 19–32)
CREATININE: 0.76 mg/dL (ref 0.50–1.10)
Calcium: 8.7 mg/dL (ref 8.4–10.5)
Chloride: 105 mEq/L (ref 96–112)
Glucose, Bld: 91 mg/dL (ref 70–99)
Potassium: 4.5 mEq/L (ref 3.5–5.3)
Sodium: 141 mEq/L (ref 135–145)
Total Protein: 6 g/dL (ref 6.0–8.3)

## 2013-07-13 LAB — TSH: TSH: 2.065 u[IU]/mL (ref 0.350–4.500)

## 2013-07-13 LAB — LIPID PANEL
CHOL/HDL RATIO: 3.1 ratio
CHOLESTEROL: 160 mg/dL (ref 0–200)
HDL: 52 mg/dL (ref >39)
LDL Cholesterol: 94 mg/dL (ref 0–99)
Triglycerides: 70 mg/dL (ref <150)
VLDL: 14 mg/dL (ref 0–40)

## 2013-07-13 LAB — VITAMIN D 25 HYDROXY (VIT D DEFICIENCY, FRACTURES): Vit D, 25-Hydroxy: 28 ng/mL — ABNORMAL LOW (ref 30–89)

## 2013-07-13 NOTE — Telephone Encounter (Signed)
Pt returning call

## 2013-07-13 NOTE — Progress Notes (Signed)
Encounter reviewed by Dr. Dartanyon Frankowski Silva.  

## 2013-07-13 NOTE — Telephone Encounter (Signed)
Message copied by Graylon Good on Tue Jul 13, 2013 11:55 AM ------      Message from: Kem Boroughs R      Created: Tue Jul 13, 2013  8:31 AM       Let patient know that lipid panel is normal, TSH normal.  Her VIt D this year is 7, last year at 58 - make sure that she takes about 1000 IU daily.  One of her liver test - Bilirubin is still up.  Last year ist was 1.7 and this year 1.9 (normal should be 0.2- 1.2)  She will need to follow with PCP. ------

## 2013-07-13 NOTE — Telephone Encounter (Signed)
I have attempted to contact this patient by phone with the following results: left message to return my call on answering machine (home/mobile per DPR).

## 2013-07-13 NOTE — Progress Notes (Signed)
Reviewed and agree with management. Mikeyla Music D. Shon Indelicato, M.D., FACG  

## 2013-07-14 LAB — IPS PAP TEST WITH HPV

## 2013-07-14 NOTE — Telephone Encounter (Signed)
Pt notified in result note.

## 2013-07-26 ENCOUNTER — Telehealth: Payer: Self-pay | Admitting: Nurse Practitioner

## 2013-07-26 ENCOUNTER — Telehealth: Payer: Self-pay | Admitting: *Deleted

## 2013-07-26 NOTE — Telephone Encounter (Signed)
Rob please see my office note on this patient. She has seen ENT and pulmonary without answer for her coughing. Do you want pH study / bravo?  Nothing helps cough but codeine which I refilled once for her but don't want to keep doing unless a GI evaluation is underway. Thanks, Nevin Bloodgood

## 2013-07-26 NOTE — Telephone Encounter (Signed)
Patient is calling again to see what she should do about her cough and if she needs a referral to some one else. Please, advise.

## 2013-07-26 NOTE — Telephone Encounter (Signed)
Patient wants to let Tye Savoy, NP know she is not any better. States the only thing that stops her cough is Codeine cough syrup. States she has seen her PCP also. She wants to know what to do next. Please, advise.

## 2013-07-27 ENCOUNTER — Ambulatory Visit (INDEPENDENT_AMBULATORY_CARE_PROVIDER_SITE_OTHER): Payer: BC Managed Care – PPO | Admitting: Internal Medicine

## 2013-07-27 ENCOUNTER — Telehealth: Payer: Self-pay | Admitting: Internal Medicine

## 2013-07-27 ENCOUNTER — Other Ambulatory Visit: Payer: Self-pay

## 2013-07-27 ENCOUNTER — Encounter: Payer: Self-pay | Admitting: Internal Medicine

## 2013-07-27 VITALS — BP 90/60 | HR 106 | Temp 98.4°F | Ht 63.5 in | Wt 159.0 lb

## 2013-07-27 DIAGNOSIS — R05 Cough: Secondary | ICD-10-CM

## 2013-07-27 DIAGNOSIS — D869 Sarcoidosis, unspecified: Secondary | ICD-10-CM

## 2013-07-27 DIAGNOSIS — K219 Gastro-esophageal reflux disease without esophagitis: Secondary | ICD-10-CM

## 2013-07-27 DIAGNOSIS — R059 Cough, unspecified: Secondary | ICD-10-CM

## 2013-07-27 MED ORDER — ESOMEPRAZOLE MAGNESIUM 40 MG PO CPDR: 40.0000 mg | DELAYED_RELEASE_CAPSULE | Freq: Every day | ORAL | Status: DC

## 2013-07-27 MED ORDER — PREDNISONE 10 MG PO TABS: ORAL_TABLET | ORAL | Status: DC

## 2013-07-27 MED ORDER — PROMETHAZINE-CODEINE 6.25-10 MG/5ML PO SYRP
5.0000 mL | ORAL_SOLUTION | ORAL | Status: DC | PRN
Start: 2013-07-27 — End: 2013-08-10

## 2013-07-27 NOTE — Progress Notes (Signed)
Subjective:    Patient ID: Sherri Stafford, female    DOB: 09/08/1950  MRN: [id number]    Brief patient profile:  29 yowf retired Psychologist, prison and probation services quit smoking in 1986 dx of sarcoid clinically in 2004 assoc with arthritis no rash rx short course prednisone and no bx by Internist (doesn't remember name)  Referred  To pulmonary clinic for cough 06/24/2013 by Dr Johnnette Gourd gyn = Sherri Stafford/ primary is Sherri Stafford    History of Present Illness  06/24/2013 1st [facility] office visit/ Sherri Stafford cc "always get bronchitis" since childhood "they thought I had whooping coughs multiple times"  Typically required narcotic resolves p 3-4 weeks s prednisone or inhaler.  This episode  acute onset  X 5 weeks while sipping diet coke then became gradually worse and better p cough meds per Arelia Sneddon, seen by Dr Redmond Baseman 06/18/13 neg eval with dx of gerd rec Prednisone 10 mg take  4 each am x 2 days,   2 each am x 2 days,  1 each am x 2 days and stop  Nexium  40 mg   Take 30-60 min before first meal of the day and Pepcid 20 mg one bedtime and chlortrimeton 4 mg at bedtime  until return to office - this is the best way to tell whether stomach acid is contributing to your problem.   GERD  Return in 2 weeks if not better    July 06 2013 GI eval > not likely gerd   07/27/2013 f/u ov/Kallan Bischoff re:  Chronic cough  Chief Complaint  Patient presents with  . Follow-up    Pt states that her cough is worse and has been "gasping for breath" for the past 4-5 days. Cough is still non prod.    does improve in all regards while on prednisone, worse off it. Sob mostly with coughing     No obvious day to day or daytime variabilty or assoc  cp or chest tightness, subjective wheeze overt sinus or hb symptoms. No unusual exp hx or h/o childhood pna/ asthma or knowledge of premature birth.  Sleeping ok without nocturnal  or early am exacerbation  of respiratory  c/o's or need for noct saba. Also denies any obvious fluctuation of  symptoms with weather or environmental changes or other aggravating or alleviating factors except as outlined above   Current Medications, Allergies, Complete Past Medical History, Past Surgical History, Family History, and Social History were reviewed in [employer] record.  ROS  The following are not active complaints unless bolded sore throat, dysphagia, dental problems, itching, sneezing,  nasal congestion or excess/ purulent secretions, ear ache,   fever, chills, sweats, unintended wt loss, pleuritic or exertional cp, hemoptysis,  orthopnea pnd or leg swelling, presyncope, palpitations, heartburn, abdominal pain, anorexia, nausea, vomiting, diarrhea  or change in bowel or urinary habits, change in stools or urine, dysuria,hematuria,  rash, arthralgias, visual complaints, headache, numbness weakness or ataxia or problems with walking or coordination,  change in mood/affect or memory.                       Objective:   Physical Exam  07/27/2013        159  Wt Readings from Last 3 Encounters:  06/24/13 160 lb 9.6 oz (72.848 kg)  06/01/13 158 lb (71.668 kg)  05/28/13 159 lb (72.122 kg)      HEENT: nl dentition, turbinates, and orophanx. Nl external ear canals without cough reflex  NECK :  without JVD/Nodes/TM/ nl carotid upstrokes bilaterally   LUNGS: no acc muscle use, clear to A and P bilaterally with some cough on insp but not consistently    CV:  RRR  no s3 or murmur or increase in P2, no edema   ABD:  soft and nontender with nl excursion in the supine position. No bruits or organomegaly, bowel sounds nl  MS:  warm without deformities, calf tenderness, cyanosis or clubbing  SKIN: warm and dry without lesions    NEURO:  alert, approp, no deficits    CXR  06/24/2013 :  1. Innumerable bilateral pulmonary parenchymal masses are present  worrisome for metastatic disease.  2. Mild hyperinflation is present consistent with COPD.         Assessment & Plan:

## 2013-07-27 NOTE — Patient Instructions (Addendum)
You will not improve until you eliminate the cough x 3 days minimal of no coughing at all  Prednisone 10 mg take 2 every day with breakfast until return 2 weeks  Nexium 40 mg Take 30-60 min before first meal of the day and Pepcid 20 mg at bedtime along with chlortrimeton 4 mg at bedtime   Take enough phenergan with codeine to stop the cough completely and completely rest your voice  GERD (REFLUX)  is an extremely common cause of respiratory symptoms, many times with no significant heartburn at all.    It can be treated with medication, but also with lifestyle changes including avoidance of late meals, excessive alcohol, smoking cessation, and avoid fatty foods, chocolate, peppermint, colas, red wine, and acidic juices such as orange juice.  NO MINT OR MENTHOL PRODUCTS SO NO COUGH DROPS  USE SUGARLESS CANDY INSTEAD (jolley ranchers or Stover's)  NO OIL BASED VITAMINS - use powdered substitutes.  Please schedule a follow up office visit in 2 weeks, sooner if needed with cxr on return

## 2013-07-27 NOTE — Telephone Encounter (Signed)
Spoke with patient and she states that the constant coughing is now causing her to have some SOB. Spoke with Tye Savoy, NP and she wants patient to call her pulmonology  MD for this. Patient given recommendation.

## 2013-07-27 NOTE — Telephone Encounter (Signed)
Dr. Deatra Ina I checked with [facility] and they can do this egd bravo on 08/26/13@12 :30pm. Is that ok with you? Please advise.

## 2013-07-27 NOTE — Telephone Encounter (Signed)
yes

## 2013-07-27 NOTE — Telephone Encounter (Signed)
Pt scheduled for EGD bravo at [facility] 08/26/13@12 :30pm. Pt to arrive there at 11:30am. Prep instructions will be mailed to pt.

## 2013-07-27 NOTE — Telephone Encounter (Signed)
Sherri Stafford, Dr. Deatra Ina agrees with pH / Bravo study for her. I will forward his message to you. Will you get this scheduled please. Thanks

## 2013-07-27 NOTE — Telephone Encounter (Signed)
Spoke with pt. She reports she is feeling very SOB and coughing. Pt saw Dr. Deatra Ina and was told her cough had nothing to do with her reflux. No openings w/ any provider today and pt wants to be seen today. Please advise MW thanks  Allergies  Allergen Reactions  . Ketoconazole   . Nystatin Hives  . Sporanox [Itraconazole] Hives

## 2013-07-27 NOTE — Telephone Encounter (Signed)
Let's proceed with 48 hr Bravo pH study while the patient continues her meds

## 2013-07-27 NOTE — Telephone Encounter (Signed)
Add on with all meds in hand at 115 if possible

## 2013-07-27 NOTE — Telephone Encounter (Signed)
Spoke with pt. She is coming in at 1:15 to see MW

## 2013-07-28 ENCOUNTER — Telehealth: Payer: Self-pay | Admitting: *Deleted

## 2013-07-28 NOTE — Assessment & Plan Note (Signed)
Reported response to steroids suggest there is sarcoid involvement vs cough variant asthma vs eos bronchitis vs rhinitis   I had an extended discussion with the patient today lasting 15 to 20 minutes of a 25 minute visit on the following issues:   Cyclical cough protocol reviewed line by line  See instructions for specific recommendations which were reviewed directly with the patient who was given a copy with highlighter outlining the key components.

## 2013-07-28 NOTE — Assessment & Plan Note (Signed)
-   dx 2004 no bx/ assoc with arthritis, short course prednisone only  - CXR last done 2006 and c/w pulmonary sarcoid per [facility]> requested repoort 07/27/13  - started back on daily pred 07/27/13   The goal with a chronic steroid dependent illness is always arriving at the lowest effective dose that controls the disease/symptoms and not accepting a set "formula" which is based on statistics or guidelines that don't always take into account patient  variability or the natural hx of the dz in every individual patient, which may well vary over time.  For now therefore I recommend the patient maintain  20 mg per day for 2 weeks then regroup with cxr

## 2013-07-28 NOTE — Telephone Encounter (Signed)
I do not see in her notes who the Rheumatologist is  [facility] for the pt so I can ask her

## 2013-07-28 NOTE — Telephone Encounter (Signed)
Left message for pt to call back  °

## 2013-07-28 NOTE — Telephone Encounter (Signed)
Message copied by Rosana Berger on Wed Jul 28, 2013  4:56 PM ------      Message from: Christinia Gully B      Created: Tue Jul 27, 2013  1:35 PM       Needs last rheum note from [facility] and last cxr report he has on file  ------

## 2013-07-29 NOTE — Telephone Encounter (Signed)
Left message for pt to call back.  Spoke with pt and she is aware of appt date and time. Prep instructions mailed to pt. Pt states she would like to see what happens with Dr. Gustavus Bryant plan before doing this. Instructed pt to call us back and let us know if we need to cancel this appt. Pt verbalized understanding.

## 2013-07-29 NOTE — Telephone Encounter (Signed)
Spoke with the pt  She states that it was Dr Charlestine Night that she was seen by in 2003  I have called his office at [phone] and spoke with [place]  She states that since it has been so long since pt was seen her chart is not there, it is in outside storage  She has upcoming appt there on 08/25/12 and so they will have to request the chart the wk before  I advised the pt when she is seen to have them fax her records here  Will forward to MW so he is aware

## 2013-08-02 ENCOUNTER — Telehealth: Payer: Self-pay | Admitting: Internal Medicine

## 2013-08-02 ENCOUNTER — Telehealth: Payer: Self-pay | Admitting: Gastroenterology

## 2013-08-02 DIAGNOSIS — R05 Cough: Secondary | ICD-10-CM

## 2013-08-02 DIAGNOSIS — R059 Cough, unspecified: Secondary | ICD-10-CM

## 2013-08-02 NOTE — Telephone Encounter (Signed)
Per OV 07/27/13: You will not improve until you eliminate the cough x 3 days minimal of no coughing at all Prednisone 10 mg take 2 every day with breakfast until return 2 weeks Nexium 40 mg Take 30-60 min before first meal of the day and Pepcid 20 mg at bedtime along with chlortrimeton 4 mg at bedtime  Take enough phenergan with codeine to stop the cough completely and completely rest your voice GERD (REFLUX)  is an extremely common cause of respiratory symptoms, many times with no significant heartburn at all.   It can be treated with medication, but also with lifestyle changes including avoidance of late meals, excessive alcohol, smoking cessation, and avoid fatty foods, chocolate, peppermint, colas, red wine, and acidic juices such as orange juice.   NO MINT OR MENTHOL PRODUCTS SO NO COUGH DROPS  USE SUGARLESS CANDY INSTEAD (jolley ranchers or Stover's)   NO OIL BASED VITAMINS - use powdered substitutes. Please schedule a follow up office visit in 2 weeks, sooner if needed with cxr on return  ----- Spoke with pt. She reports she started the cough regimen Thursday night.  She takes the phenergan cough syrup once every 3 hrs.  The cough is much better but still coughs throughout the day.  Takes prednisone, pepcid, nexium, chlorpheniramine as directed.  Perfumes will cause her cough. No specific thing/time during the day when cough happens. 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 Talking to me on the phone she was coughing some. She reports it was d/t being on voice rest.  Pt wants to know does she need to do something different or continue what she is doing now. Please advise MW thanks

## 2013-08-02 NOTE — Telephone Encounter (Signed)
I called and made pt aware of recs. She reports she is fine to work. Sinus CT order placed. Nothing further needed

## 2013-08-02 NOTE — Telephone Encounter (Signed)
Needs to go ahead with sinus CT Continue the cough med 2 tsp every 3 hours and avoid voice use Ok to put out ov work until next [employer]	[place]

## 2013-08-02 NOTE — Telephone Encounter (Signed)
Called and spoke with pt and she stated that she forgot to let MW know earlier that she started last week with increase in SOB while walking.  She went on a short walk today and was out of breath.  She wanted to call and let MW know about this since this is something new.  MW please advise. thanks

## 2013-08-02 NOTE — Telephone Encounter (Signed)
Spoke with patient and explained that the [facility] probe testing is to help [place] determine if her GERD is causing the cough and if her current GERD medications are controlling her reflux.

## 2013-08-02 NOTE — Telephone Encounter (Signed)
Called and made pt aware. Nothing further needed 

## 2013-08-02 NOTE — Telephone Encounter (Signed)
The main concern for now is that she give the airway a chance to heal and not aggravate it in any way so should not really be out much but re-evaluate the sob separate from the cough once she's completely eliminated the cough

## 2013-08-06 ENCOUNTER — Ambulatory Visit (INDEPENDENT_AMBULATORY_CARE_PROVIDER_SITE_OTHER)
Admission: RE | Admit: 2013-08-06 | Discharge: 2013-08-06 | Disposition: A | Discharge status: Home / Self Care | Payer: BC Managed Care – PPO | Source: Ambulatory Visit | Attending: Internal Medicine | Admitting: Internal Medicine

## 2013-08-06 ENCOUNTER — Encounter: Payer: Self-pay | Admitting: Internal Medicine

## 2013-08-06 DIAGNOSIS — R059 Cough, unspecified: Secondary | ICD-10-CM

## 2013-08-06 DIAGNOSIS — R05 Cough: Secondary | ICD-10-CM

## 2013-08-06 NOTE — Progress Notes (Signed)
Quick Note:  LMTCB ______ 

## 2013-08-09 ENCOUNTER — Encounter (HOSPITAL_COMMUNITY): Payer: Self-pay | Admitting: Pharmacy Technician

## 2013-08-09 NOTE — Progress Notes (Signed)
Quick Note:  Spoke with pt and notified of results per Dr. Wert. Pt verbalized understanding and denied any questions.  ______ 

## 2013-08-10 ENCOUNTER — Ambulatory Visit (INDEPENDENT_AMBULATORY_CARE_PROVIDER_SITE_OTHER)
Admission: RE | Admit: 2013-08-10 | Discharge: 2013-08-10 | Disposition: A | Discharge status: Home / Self Care | Payer: BC Managed Care – PPO | Source: Ambulatory Visit | Attending: Internal Medicine | Admitting: Internal Medicine

## 2013-08-10 ENCOUNTER — Encounter: Payer: Self-pay | Admitting: Internal Medicine

## 2013-08-10 ENCOUNTER — Other Ambulatory Visit (INDEPENDENT_AMBULATORY_CARE_PROVIDER_SITE_OTHER): Payer: BC Managed Care – PPO

## 2013-08-10 ENCOUNTER — Ambulatory Visit (INDEPENDENT_AMBULATORY_CARE_PROVIDER_SITE_OTHER): Payer: BC Managed Care – PPO | Admitting: Internal Medicine

## 2013-08-10 VITALS — BP 120/78 | HR 92 | Temp 98.4°F | Ht 65.0 in | Wt 160.6 lb

## 2013-08-10 DIAGNOSIS — D869 Sarcoidosis, unspecified: Secondary | ICD-10-CM

## 2013-08-10 DIAGNOSIS — R05 Cough: Secondary | ICD-10-CM

## 2013-08-10 DIAGNOSIS — R0989 Other specified symptoms and signs involving the circulatory and respiratory systems: Secondary | ICD-10-CM

## 2013-08-10 DIAGNOSIS — R0600 Dyspnea, unspecified: Secondary | ICD-10-CM

## 2013-08-10 DIAGNOSIS — R059 Cough, unspecified: Secondary | ICD-10-CM

## 2013-08-10 DIAGNOSIS — R0609 Other forms of dyspnea: Secondary | ICD-10-CM

## 2013-08-10 LAB — CBC WITH DIFFERENTIAL/PLATELET
Basophils Absolute: 0 10*3/uL (ref 0.0–0.1)
Basophils Relative: 0.1 % (ref 0.0–3.0)
EOS PCT: 0 % (ref 0.0–5.0)
Eosinophils Absolute: 0 10*3/uL (ref 0.0–0.7)
HCT: 41 % (ref 36.0–46.0)
Hemoglobin: 13.9 g/dL (ref 12.0–15.0)
Lymphocytes Relative: 8.2 % — ABNORMAL LOW (ref 12.0–46.0)
Lymphs Abs: 1.8 10*3/uL (ref 0.7–4.0)
MCHC: 33.8 g/dL (ref 30.0–36.0)
MCV: 83.7 fl (ref 78.0–100.0)
MONOS PCT: 2.5 % — AB (ref 3.0–12.0)
Monocytes Absolute: 0.6 10*3/uL (ref 0.1–1.0)
NEUTROS PCT: 89.2 % — AB (ref 43.0–77.0)
Neutro Abs: 19.9 10*3/uL — ABNORMAL HIGH (ref 1.4–7.7)
Platelets: 372 10*3/uL (ref 150.0–400.0)
RBC: 4.89 Mil/uL (ref 3.87–5.11)
RDW: 14 % (ref 11.5–14.6)
WBC: 22.3 10*3/uL — AB (ref 4.5–10.5)

## 2013-08-10 LAB — SEDIMENTATION RATE: Sed Rate: 31 mm/hr — ABNORMAL HIGH (ref 0–22)

## 2013-08-10 MED ORDER — PROMETHAZINE-CODEINE 6.25-10 MG/5ML PO SYRP: 5.0000 mL | ORAL_SOLUTION | ORAL | Status: DC | PRN

## 2013-08-10 NOTE — Progress Notes (Signed)
Subjective:    Patient ID: Sherri Stafford, female    DOB: May 01, 1950  MRN: [id number]    Brief patient profile:  71 yowf retired Psychologist, prison and probation services quit smoking in 1986 dx of sarcoid clinically in 2004 assoc with arthritis no rash rx short course prednisone and no bx by Internist (doesn't remember name)  Referred  To pulmonary clinic for cough 06/24/2013 by Dr Sherri Stafford gyn = Sherri Stafford/ primary is Sherri Stafford    History of Present Illness  06/24/2013 1st [facility] office visit/ Sherri Stafford cc "always get bronchitis" since childhood "they thought I had whooping cough multiple times"  Typically required narcotic resolves p 3-4 weeks s prednisone or inhaler.  This episode  acute onset  X 5 weeks while sipping diet coke then became gradually worse and better p cough meds per Sherri Stafford, seen by Dr Sherri Stafford 06/18/13 neg eval with dx of gerd rec Prednisone 10 mg take  4 each am x 2 days,   2 each am x 2 days,  1 each am x 2 days and stop  Nexium  40 mg   Take 30-60 min before first meal of the day and Pepcid 20 mg one bedtime and chlortrimeton 4 mg at bedtime  until return to office - this is the best way to tell whether stomach acid is contributing to your problem.   GERD  Return in 2 weeks if not better    July 06 2013 GI eval > not likely gerd   07/27/2013 f/u ov/Sherri Stafford re:  Chronic cough  Chief Complaint  Patient presents with  . Follow-up    Pt states that her cough is worse and has been "gasping for breath" for the past 4-5 days. Cough is still non prod.   does improve in all regards while on prednisone, worse off it. Sob mostly with coughing  rec you will not improve until you eliminate the cough x 3 days minimal of no coughing at all Prednisone 10 mg take 2 every day with breakfast until return 2 weeks Nexium 40 mg Take 30-60 min before first meal of the day and Pepcid 20 mg at bedtime along with chlortrimeton 4 mg at bedtime  Take enough phenergan with codeine to stop the cough completely  and completely rest your voice GERD (REFLUX)     08/10/2013 f/u ov/Sherri Stafford re: refractory cough / nodular changes on cxr ? Sarcoid vs met ca Chief Complaint  Patient presents with  . Follow-up    Pt had CXR today. C/o PND and sinus pressure x 1 week. Pt states her cough has imporved, cough still present. Pt c/o SOB with moderate activity. Denies CP.   marked overall improvement in cough severity/ intensity and need for codeine to control on 20 mg pred /day  No obvious day to day or daytime variabilty or assoc  cp or chest tightness, subjective wheeze overt sinus or hb symptoms. No unusual exp hx or h/o childhood pna/ asthma or knowledge of premature birth.  Sleeping ok without nocturnal  or early am exacerbation  of respiratory  c/o's or need for noct saba. Also denies any obvious fluctuation of symptoms with weather or environmental changes or other aggravating or alleviating factors except as outlined above   Current Medications, Allergies, Complete Past Medical History, Past Surgical History, Family History, and Social History were reviewed in [employer] record.  ROS  The following are not active complaints unless bolded sore throat, dysphagia, dental problems, itching, sneezing,  nasal  congestion or excess/ purulent secretions, ear ache,   fever, chills, sweats, unintended wt loss, pleuritic or exertional cp, hemoptysis,  orthopnea pnd or leg swelling, presyncope, palpitations, heartburn, abdominal pain, anorexia, nausea, vomiting, diarrhea  or change in bowel or urinary habits, change in stools or urine, dysuria,hematuria,  rash, arthralgias, visual complaints, headache, numbness weakness or ataxia or problems with walking or coordination,  change in mood/affect or memory.                       Objective:   Physical Exam  07/27/2013        159  > 08/10/2013  160  Wt Readings from Last 3 Encounters:  06/24/13 160 lb 9.6 oz (72.848 kg)  06/01/13 158 lb (71.668  kg)  05/28/13 159 lb (72.122 kg)      HEENT: nl dentition, turbinates, and orophanx. Nl external ear canals without cough reflex   NECK :  without JVD/ /TM/ nl carotid upstrokes bilaterally R supraclavilar node x large marble   LUNGS: no acc muscle use, clear to A and P bilaterally with some cough on insp but not consistently    CV:  RRR  no s3 or murmur or increase in P2, no edema   ABD:  soft and nontender with nl excursion in the supine position. No bruits or organomegaly, bowel sounds nl  MS:  warm without deformities, calf tenderness, cyanosis or clubbing  SKIN: warm and dry without lesions    NEURO:  alert, approp, no deficits    CXR  08/10/2013 : Bilateral pulmonary nodules/masses, measuring up to 4.1 cm in the  left lower lobe, progressed. This remains worrisome for primary or  metastatic malignancy.  CT chest with contrast is suggested for further evaluation         Assessment & Plan:

## 2013-08-10 NOTE — Progress Notes (Signed)
Quick Note:  LMTCB ______ 

## 2013-08-10 NOTE — Patient Instructions (Addendum)
For drainage as needed  take chlortrimeton (chlorpheniramine) 4 mg every 4 hours available over the counter (may cause drowsiness)   But automatically take 1 after supper and 2 at bedtime   No change on prednisone   Please see patient coordinator before you leave today  to schedule CTa chest  Please remember to go to the lab  department downstairs for your tests - we will call you with the results when they are available.

## 2013-08-11 LAB — ANGIOTENSIN CONVERTING ENZYME: ANGIOTENSIN-CONVERTING ENZYME: 30 U/L (ref 8–52)

## 2013-08-11 NOTE — Assessment & Plan Note (Signed)
-   dx 2004 no bx/ assoc with arthritis, short course prednisone only  - CXR last done 2006 and c/w pulmonary sarcoid per [facility]low> requested repoort 07/27/13  - started back on daily pred 07/27/13   cxr not better with one lesion in LLL slt larger and palp hard node R neck  rec CT Chest and fna next step

## 2013-08-11 NOTE — Assessment & Plan Note (Signed)
-   Sinus CT 08/06/2013 >Paranasal sinuses are clear. Slight leftward deviation of nasal septum.  Better on pred plus gerd rx but still requiring codeine > rec taper if possible and push h1 harder if tolerated  See instructions for specific recommendations which were reviewed directly with the patient who was given a copy with highlighter outlining the key components.

## 2013-08-11 NOTE — Assessment & Plan Note (Signed)
Nodules not enough to explain new doe > do CTa insteady of routine CT chest since if has met dz risk for occult PE

## 2013-08-11 NOTE — Progress Notes (Signed)
Quick Note:  Spoke with pt and notified of results per Dr. Wert. Pt verbalized understanding and denied any questions.  ______ 

## 2013-08-12 ENCOUNTER — Ambulatory Visit (INDEPENDENT_AMBULATORY_CARE_PROVIDER_SITE_OTHER)
Admission: RE | Admit: 2013-08-12 | Discharge: 2013-08-12 | Disposition: A | Discharge status: Home / Self Care | Payer: BC Managed Care – PPO | Source: Ambulatory Visit | Attending: Internal Medicine | Admitting: Internal Medicine

## 2013-08-12 DIAGNOSIS — R0989 Other specified symptoms and signs involving the circulatory and respiratory systems: Secondary | ICD-10-CM

## 2013-08-12 DIAGNOSIS — R0600 Dyspnea, unspecified: Secondary | ICD-10-CM

## 2013-08-12 DIAGNOSIS — R0609 Other forms of dyspnea: Secondary | ICD-10-CM

## 2013-08-12 MED ORDER — IOHEXOL 350 MG/ML SOLN
80.0000 mL | Freq: Once | INTRAVENOUS | Status: AC | PRN
  Administered 2013-08-12: 80 mL via INTRAVENOUS

## 2013-08-13 ENCOUNTER — Telehealth: Payer: Self-pay | Admitting: Internal Medicine

## 2013-08-13 ENCOUNTER — Encounter: Payer: Self-pay | Admitting: Internal Medicine

## 2013-08-13 ENCOUNTER — Telehealth: Payer: Self-pay | Admitting: Gastroenterology

## 2013-08-13 DIAGNOSIS — D869 Sarcoidosis, unspecified: Secondary | ICD-10-CM

## 2013-08-13 NOTE — Progress Notes (Signed)
Quick Note:  Spoke with pt and notified of results per Dr. Wert. Pt verbalized understanding and denied any questions.  ______ 

## 2013-08-13 NOTE — Telephone Encounter (Signed)
Pt returning call

## 2013-08-13 NOTE — Telephone Encounter (Signed)
Did she state why?

## 2013-08-13 NOTE — Telephone Encounter (Signed)
lmomtcb x1 

## 2013-08-13 NOTE — Telephone Encounter (Signed)
She did not.

## 2013-08-13 NOTE — Telephone Encounter (Signed)
Notes Recorded by Tanda Rockers, MD on 08/13/2013 at 8:50 AM Call patient : I told her results and needs to be set u/s guided R supraclaviular lymph node bx asap --  Pt is wanting to know when this will be set up or if you want [place] to set up. Please advise MW as I don't see an order in EPIC. thanks

## 2013-08-13 NOTE — Telephone Encounter (Signed)
MW called and spoke with pt and she is aware of results.  Nothing further is needed.

## 2013-08-13 NOTE — Telephone Encounter (Signed)
I just send order to [facility] with STAT priority  I spoke with the pt and notified that she will be contacted asap- if not today, then Monday She verbalized understanding

## 2013-08-13 NOTE — Telephone Encounter (Signed)
Pt wanted EGD with [facility] cancelled at the hospital. Pt does not want to reschedule. Appt cancelled at [facility] and Dr. Deatra Ina notified.

## 2013-08-16 ENCOUNTER — Telehealth: Payer: Self-pay | Admitting: Internal Medicine

## 2013-08-16 NOTE — Telephone Encounter (Signed)
Per Maryan Puls radiology is working on scheduling this, and pt will be called within the next 1 to 2 days  I spoke with the pt and notified of this and she verbalized understanding  Nothing further needed

## 2013-08-16 NOTE — Telephone Encounter (Signed)
Spoke with Suanne Marker regarding status of bx She is going to call [facility] and check and let me know Will hold in my basket

## 2013-08-19 ENCOUNTER — Telehealth: Payer: Self-pay | Admitting: Internal Medicine

## 2013-08-19 ENCOUNTER — Telehealth: Payer: Self-pay | Admitting: Gastroenterology

## 2013-08-19 ENCOUNTER — Encounter (HOSPITAL_COMMUNITY): Payer: Self-pay | Admitting: Pharmacy Technician

## 2013-08-19 NOTE — Telephone Encounter (Signed)
Spoke with Morey Hummingbird at [facility] and this biopsy has not been scheduled as of yet. I advised her that the patient is calling and usually it doesn't take this long for it to be scheduled. Morey Hummingbird stated that if the patient has any questions, to give them their phone number of [phone] and they can help answer any questions the patient might have. I called and spoke with the patient about the biopsy and advised her that it still has not been schedule yet. Gave patient the phone number as suggested by [facility]. She wrote the phone number down and I advised the patient that I would check on status of the appointment again tomorrow. Rhonda J Cobb

## 2013-08-19 NOTE — Telephone Encounter (Signed)
It's ok - we need to double the bottle size

## 2013-08-19 NOTE — Telephone Encounter (Signed)
Spoke with the pt  She is upset since she has not heard from [facility] about her biopsy  When Suanne Marker spoke with them on 08/16/13, they case was in review and she was to be contacted in 1 to 2 days  Will forward to [facility] to see what the problem is   Also, she will run out of promethazine/codeine syrup over the w/e and wants to go ahead and get this called in  I advised MW out of the office until tomorrow, and will ask him then  She verbalized understanding and states this is fine   Msg to MW and [facility]

## 2013-08-19 NOTE — Telephone Encounter (Signed)
I spoke with the pt  She states not using any more than 6 times per day  I called the pharmacist and tried to give VO to refill her med on Sat 5/9 since this is when she will run out  If we double to bottle size that would be 960 ml  Pharmacist states " that is unheard of" and that he does not think MW understands that she is abusing med, b/c even if she is using this 6 times per day, the 480 ml should have lasted her 16 days I do not know what else to do from here  We can discuss when MW returns to the office?

## 2013-08-19 NOTE — Telephone Encounter (Signed)
She's right she should have heard from them by now  [facility] to refill cough syrup

## 2013-08-19 NOTE — Telephone Encounter (Signed)
Pt is due for Colon recall in 03/2014. Recall entered in epic and pt aware.

## 2013-08-19 NOTE — Telephone Encounter (Signed)
US Biopsy has been scheduled for 08/23/13 at 10:00 at [facility] Rhonda J Cobb

## 2013-08-19 NOTE — Telephone Encounter (Signed)
I spoke with Suanne Marker and she is going to call [facility] and let the pt know  I tried calling in a refill and the pharmacist wanted to to check with you again She had # 480 ml (1 pint) filled on 07/29/13 and again on 08/11/13  She should not be out of med until 09/04/13  Please advise thanks

## 2013-08-20 ENCOUNTER — Telehealth: Payer: Self-pay | Admitting: Internal Medicine

## 2013-08-20 ENCOUNTER — Other Ambulatory Visit: Payer: Self-pay | Admitting: Radiology

## 2013-08-20 NOTE — Telephone Encounter (Signed)
No message needed °

## 2013-08-21 ENCOUNTER — Telehealth: Payer: Self-pay | Admitting: Pulmonary Disease

## 2013-08-21 NOTE — Telephone Encounter (Signed)
Called by pt wanting cough syrup called in.  Apparently the pharmacist denied the prescription on 5/7 because he was concerned the pt was abusing the narcotic.  MW was not available yesterday to clarify the issue.  I have told the pt she will need to call the office first thing Monday am to get clarified with MW.

## 2013-08-23 ENCOUNTER — Ambulatory Visit (HOSPITAL_COMMUNITY)
Admission: RE | Admit: 2013-08-23 | Discharge: 2013-08-23 | Disposition: A | Discharge status: Home / Self Care | Payer: BC Managed Care – PPO | Source: Ambulatory Visit | Attending: Internal Medicine | Admitting: Internal Medicine

## 2013-08-23 ENCOUNTER — Telehealth: Payer: Self-pay | Admitting: Internal Medicine

## 2013-08-23 ENCOUNTER — Other Ambulatory Visit: Payer: Self-pay | Admitting: Internal Medicine

## 2013-08-23 ENCOUNTER — Other Ambulatory Visit: Payer: Self-pay | Admitting: Critical Care Medicine

## 2013-08-23 ENCOUNTER — Encounter: Payer: Self-pay | Admitting: Internal Medicine

## 2013-08-23 ENCOUNTER — Encounter (HOSPITAL_COMMUNITY): Payer: Self-pay

## 2013-08-23 VITALS — BP 108/59 | HR 89 | Temp 97.6°F | Resp 20 | Ht 65.0 in | Wt 155.0 lb

## 2013-08-23 DIAGNOSIS — R059 Cough, unspecified: Secondary | ICD-10-CM | POA: Insufficient documentation

## 2013-08-23 DIAGNOSIS — D869 Sarcoidosis, unspecified: Secondary | ICD-10-CM

## 2013-08-23 DIAGNOSIS — D861 Sarcoidosis of lymph nodes: Secondary | ICD-10-CM

## 2013-08-23 DIAGNOSIS — R05 Cough: Secondary | ICD-10-CM | POA: Insufficient documentation

## 2013-08-23 DIAGNOSIS — R599 Enlarged lymph nodes, unspecified: Secondary | ICD-10-CM | POA: Insufficient documentation

## 2013-08-23 LAB — CBC
HCT: 43 % (ref 36.0–46.0)
HEMOGLOBIN: 14 g/dL (ref 12.0–15.0)
MCH: 28.2 pg (ref 26.0–34.0)
MCHC: 32.6 g/dL (ref 30.0–36.0)
MCV: 86.5 fL (ref 78.0–100.0)
Platelets: 234 10*3/uL (ref 150–400)
RBC: 4.97 MIL/uL (ref 3.87–5.11)
RDW: 14.7 % (ref 11.5–15.5)
WBC: 14.9 10*3/uL — ABNORMAL HIGH (ref 4.0–10.5)

## 2013-08-23 LAB — PROTIME-INR
INR: 1.12 (ref 0.00–1.49)
Prothrombin Time: 14.2 seconds (ref 11.6–15.2)

## 2013-08-23 LAB — APTT: aPTT: 26 seconds (ref 24–37)

## 2013-08-23 MED ORDER — MIDAZOLAM HCL 2 MG/2ML IJ SOLN
INTRAMUSCULAR | Status: AC
  Filled 2013-08-23: qty 2

## 2013-08-23 MED ORDER — MIDAZOLAM HCL 2 MG/2ML IJ SOLN
INTRAMUSCULAR | Status: AC | PRN
  Administered 2013-08-23: 1 mg via INTRAVENOUS

## 2013-08-23 MED ORDER — FENTANYL CITRATE 0.05 MG/ML IJ SOLN
INTRAMUSCULAR | Status: AC | PRN
  Administered 2013-08-23: 50 ug via INTRAVENOUS

## 2013-08-23 MED ORDER — SODIUM CHLORIDE 0.9 % IV SOLN: Freq: Once | INTRAVENOUS | Status: DC

## 2013-08-23 MED ORDER — FENTANYL CITRATE 0.05 MG/ML IJ SOLN
INTRAMUSCULAR | Status: AC
  Filled 2013-08-23: qty 2

## 2013-08-23 NOTE — Telephone Encounter (Signed)
Best option is Tylenol #4 one every 4 hours prn #60

## 2013-08-23 NOTE — Telephone Encounter (Signed)
Spoke with the pt and explained that we did not call her today  Nothing needed at this time

## 2013-08-23 NOTE — Telephone Encounter (Signed)
MW did you call this pt?  Didn't see anything in the computer.

## 2013-08-23 NOTE — Telephone Encounter (Signed)
I think it was about the cough med which I understand she was able to obtain so nothing further needed for now

## 2013-08-23 NOTE — Telephone Encounter (Signed)
lmomtcb for pt  Called [facility], spoke with pharm.  Was advised pt filled promethazine codiene cough syrup on 08/21/13 for #473 mL Dr. Melvyn Novas, once we speak with the pt regarding this, do you still want to proceed with the tylenol #4 given she picked up cough syrup on 5/9?

## 2013-08-23 NOTE — H&P (Signed)
Sherri Stafford is an 63 y.o. female.   Chief Complaint: Hx sarcoidosis Dry cough x weeks/months New Rt supraclavicular lymph node noticed just few weeks CT 5/1 reveals : cervical LAN; mediastinal and hilar LAN; B lung nodules Scheduled now for R [address] biopsy  HPI: GERD; sarcoidosis  Past Medical History  Diagnosis Date  . Arthritis   . Bilateral ovarian cysts   . GERD (gastroesophageal reflux disease)   . Strain of hip flexor 06/2013    left side torn    Past Surgical History  Procedure Laterality Date  . Dilation and curettage of uterus  2004    x 3   . Tubal ligation    . Colonoscopy  04/05/2004    normal     Family History  Problem Relation Age of Onset  . Brain cancer Father   . Emphysema Father     smoked  . Pancreatic cancer Mother   . Colon cancer Neg Hx   . Breast cancer Paternal Aunt 75   Social History:  reports that she quit smoking about 29 years ago. Her smoking use included Cigarettes. She has a 14 pack-year smoking history. She has never used smokeless tobacco. She reports that she does not drink alcohol or use illicit drugs.  Allergies:  Allergies  Allergen Reactions  . Ketoconazole Hives  . Nystatin Hives  . Sporanox [Itraconazole] Hives     (Not in a hospital admission)  Results for orders placed during the hospital encounter of 08/23/13 (from the past 48 hour(s))  APTT     Status: None   Collection Time    08/23/13  9:07 AM      Result Value Ref Range   aPTT 26  24 - 37 seconds  CBC     Status: Abnormal   Collection Time    08/23/13  9:07 AM      Result Value Ref Range   WBC 14.9 (*) 4.0 - 10.5 K/uL   RBC 4.97  3.87 - 5.11 MIL/uL   Hemoglobin 14.0  12.0 - 15.0 g/dL   HCT 43.0  36.0 - 46.0 %   MCV 86.5  78.0 - 100.0 fL   MCH 28.2  26.0 - 34.0 pg   MCHC 32.6  30.0 - 36.0 g/dL   RDW 14.7  11.5 - 15.5 %   Platelets 234  150 - 400 K/uL  PROTIME-INR     Status: None   Collection Time    08/23/13  9:07 AM      Result Value Ref Range   Prothrombin Time 14.2  11.6 - 15.2 seconds   INR 1.12  0.00 - 1.49   No results found.  Review of Systems  Constitutional: Negative for fever and weight loss.  Respiratory: Positive for cough and shortness of breath.   Cardiovascular: Negative for chest pain.  Gastrointestinal: Negative for nausea, vomiting and abdominal pain.  Musculoskeletal: Positive for neck pain.  Neurological: Negative for weakness.    Blood pressure 142/83, pulse 106, temperature 98.3 F (36.8 C), temperature source Oral, resp. rate 20, height 5\' 5"  (1.651 m), weight 70.308 kg (155 lb), last menstrual period 01/14/2008, SpO2 100.00%. Physical Exam  Constitutional: She is oriented to person, place, and time. She appears well-developed and well-nourished.  Neck:  Rt cervical/supraclavicular lymph node; +palpable  Cardiovascular: Normal rate, regular rhythm and normal heart sounds.   No murmur heard. Respiratory: Effort normal and breath sounds normal. She has no wheezes.  GI: Soft. Bowel sounds  are normal. There is no tenderness.  Musculoskeletal: Normal range of motion.  Neurological: She is alert and oriented to person, place, and time.  Skin: Skin is warm and dry.  Psychiatric: She has a normal mood and affect. Her behavior is normal. Judgment and thought content normal.     Assessment/Plan Sarcoidosis Persistent dry cough CT 5/1: cervical LAN; mediastinal and hilar LAN B lung nodules Scheduled now for Rt SCLN bx per Dr Melvyn Novas Pt aware of procedure benefits and risks and agreeable to proceed Consent signed and in chart  Lavonia Drafts 08/23/2013, 9:40 AM

## 2013-08-23 NOTE — H&P (Signed)
Agree.  Will try to biopsy right [place] node under US guidance.

## 2013-08-23 NOTE — Telephone Encounter (Signed)
No, I misunderstood, will wait until she finishes present rx

## 2013-08-23 NOTE — Discharge Instructions (Signed)
Needle Biopsy Care After These instructions give you information on caring for yourself after your procedure. Your doctor may also give you more specific instructions. Call your doctor if you have any problems or questions after your procedure. HOME CARE  Rest for 4 hours after your biopsy, except for getting up to go to the bathroom or as told.  Keep the places where the needles were put in clean and dry.  Do not put powder or lotion on the sites.  Do not shower until 24 hours after the test. Remove all bandages (dressings) before showering.  Remove all bandages at least once every day. Gently clean the sites with soap and water. Keep putting a new bandage on until the skin is closed. Finding out the results of your test Ask your doctor when your test results will be ready. Make sure you follow up and get the test results. GET HELP RIGHT AWAY IF:   You have shortness of breath or trouble breathing.  You have pain or cramping in your belly (abdomen).  You feel sick to your stomach (nauseous) or throw up (vomit).  Any of the places where the needles were put in:  Are puffy (swollen) or red.  Are sore or hot to the touch.  Are draining yellowish-white fluid (pus).  Are bleeding after 10 minutes of pressing down on the site. Have someone keep pressing on any place that is bleeding until you see a doctor.  You have any unusual pain that will not stop.  You have a fever. If you go to the emergency room, tell the nurse that you had a biopsy. Take this paper with you to show the nurse. MAKE SURE YOU:   Understand these instructions.  Will watch your condition.  Will get help right away if you are not doing well or get worse. Document Released: 03/14/2008 Document Revised: 06/24/2011 Document Reviewed: 03/14/2008 [facility] Patient Information 2014 [place].

## 2013-08-23 NOTE — Procedures (Signed)
Procedure:  Ultrasound guided core biopsy of right supraclavicular lymph node. Findings:  Four 18 G samples of right [place] node performed.  Samples sent in saline.

## 2013-08-24 ENCOUNTER — Telehealth: Payer: Self-pay | Admitting: Internal Medicine

## 2013-08-24 NOTE — Telephone Encounter (Signed)
Please advise MW thanks 

## 2013-08-24 NOTE — Telephone Encounter (Signed)
lmomtcb x1 

## 2013-08-24 NOTE — Telephone Encounter (Signed)
No back yet - I will keep checking and let her know when it is

## 2013-08-25 ENCOUNTER — Encounter: Payer: Self-pay | Admitting: Internal Medicine

## 2013-08-25 ENCOUNTER — Ambulatory Visit (INDEPENDENT_AMBULATORY_CARE_PROVIDER_SITE_OTHER): Payer: BC Managed Care – PPO | Admitting: Internal Medicine

## 2013-08-25 VITALS — BP 124/82 | HR 112 | Temp 99.2°F | Ht 65.0 in | Wt 161.0 lb

## 2013-08-25 DIAGNOSIS — D869 Sarcoidosis, unspecified: Secondary | ICD-10-CM

## 2013-08-25 DIAGNOSIS — R05 Cough: Secondary | ICD-10-CM

## 2013-08-25 DIAGNOSIS — R059 Cough, unspecified: Secondary | ICD-10-CM

## 2013-08-25 DIAGNOSIS — C3490 Malignant neoplasm of unspecified part of unspecified bronchus or lung: Secondary | ICD-10-CM

## 2013-08-25 MED ORDER — CODEINE SULFATE 30 MG PO TABS: 30.0000 mg | ORAL_TABLET | ORAL | Status: DC | PRN

## 2013-08-25 MED ORDER — PREDNISONE 10 MG PO TABS: 20.0000 mg | ORAL_TABLET | Freq: Every day | ORAL | Status: DC

## 2013-08-25 NOTE — Telephone Encounter (Signed)
Pt has appt at 11:15 this AM.  Pt is aware of MW repsonse

## 2013-08-25 NOTE — Progress Notes (Signed)
Subjective:    Patient ID: Sherri Stafford, female    DOB: Apr 19, 1950  MRN: [id number]    Brief patient profile:  58 yowf retired Psychologist, prison and probation services quit smoking in 1986 dx of sarcoid clinically in 2004 assoc with arthritis no rash rx short course prednisone and no bx by Internist (doesn't remember name)  Referred  To pulmonary clinic for cough 06/24/2013 by Sherri Sherri Stafford gyn = Sherri Stafford/ primary is Sherri Stafford    History of Present Illness  06/24/2013 1st [name] office visit/ Sherri Stafford cc "always get bronchitis" since childhood "they thought I had whooping cough multiple times"  Typically required narcotic resolves p 3-4 weeks s prednisone or inhaler.  This episode  acute onset  X 5 weeks while sipping diet coke then became gradually worse and better p cough meds per Sherri Stafford, seen by Sherri Stafford 06/18/13 neg eval with dx of gerd rec Prednisone 10 mg take  4 each am x 2 days,   2 each am x 2 days,  1 each am x 2 days and stop  Nexium  40 mg   Take 30-60 min before first meal of the day and Pepcid 20 mg one bedtime and chlortrimeton 4 mg at bedtime  until return to office - this is the best way to tell whether stomach acid is contributing to your problem.   GERD  Return in 2 weeks if not better    July 06 2013 GI eval > not likely gerd   07/27/2013 f/u ov/Sherri Stafford re:  Chronic cough  Chief Complaint  Patient presents with  . Follow-up    Pt states that her cough is worse and has been "gasping for breath" for the past 4-5 days. Cough is still non prod.   does improve in all regards while on prednisone, worse off it. Sob mostly with coughing  rec you will not improve until you eliminate the cough x 3 days minimal of no coughing at all Prednisone 10 mg take 2 every day with breakfast until return 2 weeks Nexium 40 mg Take 30-60 min before first meal of the day and Pepcid 20 mg at bedtime along with chlortrimeton 4 mg at bedtime  Take enough phenergan with codeine to stop the cough completely  and completely rest your voice GERD (REFLUX)     08/10/2013 f/u ov/Sherri Stafford re: refractory cough / nodular changes on cxr ? Sarcoid vs met ca Chief Complaint  Patient presents with  . Follow-up    Pt had CXR today. C/o PND and sinus pressure x 1 week. Pt states her cough has imporved, cough still present. Pt c/o SOB with moderate activity. Denies CP.   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 Follow-up    Discuss biopsy results. Pt states that her cough is worse since the last visit.      Cough controlled only on codeine, mild doe  No obvious day to day or daytime variabilty or assoc   cp or chest tightness, subjective wheeze overt sinus or hb symptoms. No  unusual exp hx or h/o childhood pna/ asthma or knowledge of premature birth.  Sleeping ok without nocturnal  or early am exacerbation  of respiratory  c/o's or need for noct saba. Also denies any obvious fluctuation of symptoms with weather or environmental changes or other aggravating or alleviating factors except as outlined above   Current Medications, Allergies, Complete Past Medical History, Past Surgical History, Family History, and Social History were reviewed in [employer] record.  ROS  The following are not active complaints unless bolded sore throat, dysphagia, dental problems, itching, sneezing,  nasal congestion or excess/ purulent secretions, ear ache,   fever, chills, sweats, unintended wt loss, pleuritic or exertional cp, hemoptysis,  orthopnea pnd or leg swelling,  presyncope, palpitations, heartburn, abdominal pain, anorexia, nausea, vomiting, diarrhea  or change in bowel or urinary habits, change in stools or urine, dysuria,hematuria,  rash, arthralgias, visual complaints, headache, numbness weakness or ataxia or problems with walking or coordination,  change in mood/affect or memory.                       Objective:   Physical Exam  07/27/2013        159  > 08/10/2013  160 > 08/25/2013  161 Wt Readings from Last 3 Encounters:  06/24/13 160 lb 9.6 oz (72.848 kg)  06/01/13 158 lb (71.668 kg)  05/28/13 159 lb (72.122 kg)      HEENT: nl dentition, turbinates, and orophanx. Nl external ear canals without cough reflex   NECK :  without JVD/ /TM/ nl carotid upstrokes bilaterally R supraclavilar Stafford x large marble   LUNGS: no acc muscle use, clear to A and P bilaterally with some cough on insp but not consistently    CV:  RRR  no s3 or murmur or increase in P2, no edema   ABD:  soft and nontender with nl excursion in the supine position. No bruits or organomegaly, bowel sounds nl  MS:  warm without deformities, calf tenderness, cyanosis or clubbing  SKIN: warm and dry without lesions    NEURO:  alert, approp, no deficits     CXR  08/10/2013 : Bilateral pulmonary nodules/masses, measuring up to 4.1 cm in the  left lower lobe, progressed. This remains worrisome for primary or  metastatic malignancy.  CT chest with contrast is suggested for further evaluation         Assessment & Plan:

## 2013-08-25 NOTE — Patient Instructions (Addendum)
nexim 20 mg x 2 take Take 30- 60 min before your first and last meals of the day and continue pepcid 20 mg daily   Prednsione 20 mg daily for now with bfast daily   I will call when report is available.

## 2013-08-26 ENCOUNTER — Ambulatory Visit (HOSPITAL_COMMUNITY)
Admission: RE | Admit: 2013-08-26 | Payer: BC Managed Care – PPO | Source: Ambulatory Visit | Admitting: Gastroenterology

## 2013-08-26 ENCOUNTER — Telehealth: Payer: Self-pay | Admitting: Internal Medicine

## 2013-08-26 ENCOUNTER — Encounter (HOSPITAL_COMMUNITY): Admission: RE | Payer: Self-pay | Source: Ambulatory Visit

## 2013-08-26 ENCOUNTER — Other Ambulatory Visit: Payer: Self-pay | Admitting: Internal Medicine

## 2013-08-26 DIAGNOSIS — C3490 Malignant neoplasm of unspecified part of unspecified bronchus or lung: Secondary | ICD-10-CM

## 2013-08-26 SURGERY — ESOPHAGOGASTRODUODENOSCOPY (EGD) WITH PROPOFOL
Anesthesia: Monitor Anesthesia Care

## 2013-08-26 NOTE — Telephone Encounter (Signed)
S/W PATIENT AND GAVE NP APPT FOR 05/20 @ 1:30 W/DR. MOHAMED.  REFERRING DR. Melvyn Novas DX- ADENOCARCINOMA OF LUNG

## 2013-08-26 NOTE — Telephone Encounter (Signed)
Called spoke with pt. She already has appt scheduled to see Dr. Julien Nordmann 5/20 and PET scan on 09/09/13. She needs nothing further

## 2013-08-27 ENCOUNTER — Telehealth: Payer: Self-pay | Admitting: Internal Medicine

## 2013-08-27 NOTE — Assessment & Plan Note (Signed)
-   Sinus CT 08/06/2013 >Paranasal sinuses are clear. Slight leftward deviation of nasal septum.  rx with codeine

## 2013-08-27 NOTE — Telephone Encounter (Signed)
Pt called back. She would like to speak with MW regarding her results personally. Please advise thanks

## 2013-08-27 NOTE — Assessment & Plan Note (Signed)
-   dx 2004 no bx/ assoc with arthritis, short course prednisone only  - CXR last done 2006 and c/w pulmonary sarcoid per Tr[facility]low> requested repoort 07/27/13  - started back on daily pred 07/27/13  - [place] bx R [place] node   08/23/2013 > Pos Adenoca lung primary > rec taper off pred x 2 weeks starting 08/25/2013

## 2013-08-27 NOTE — Telephone Encounter (Signed)
Discussed with pt

## 2013-08-27 NOTE — Telephone Encounter (Signed)
lmomtcb x1 

## 2013-08-27 NOTE — Assessment & Plan Note (Addendum)
Stage IV Dx 08/23/13 > referred to oncology  I had an extended discussion with the patient today lasting 15 to 20 minutes of a 25 minute visit on the following issues:   Clearly the cough and the tumor are related and can use codeine either in liquid or tablet form to control  Defer further staging/ rx to oncology - informed this is treatable but not curable

## 2013-08-31 ENCOUNTER — Other Ambulatory Visit: Payer: Self-pay | Admitting: *Deleted

## 2013-08-31 DIAGNOSIS — C3490 Malignant neoplasm of unspecified part of unspecified bronchus or lung: Secondary | ICD-10-CM

## 2013-09-01 ENCOUNTER — Ambulatory Visit: Payer: BC Managed Care – PPO

## 2013-09-01 ENCOUNTER — Ambulatory Visit (HOSPITAL_BASED_OUTPATIENT_CLINIC_OR_DEPARTMENT_OTHER): Payer: BC Managed Care – PPO | Admitting: Internal Medicine

## 2013-09-01 ENCOUNTER — Encounter: Payer: Self-pay | Admitting: Internal Medicine

## 2013-09-01 ENCOUNTER — Other Ambulatory Visit (HOSPITAL_BASED_OUTPATIENT_CLINIC_OR_DEPARTMENT_OTHER): Payer: BC Managed Care – PPO

## 2013-09-01 ENCOUNTER — Other Ambulatory Visit: Payer: Self-pay | Admitting: Internal Medicine

## 2013-09-01 ENCOUNTER — Telehealth: Payer: Self-pay | Admitting: Internal Medicine

## 2013-09-01 VITALS — BP 144/84 | HR 108 | Temp 98.2°F | Resp 18 | Ht 65.0 in | Wt 158.5 lb

## 2013-09-01 DIAGNOSIS — C3490 Malignant neoplasm of unspecified part of unspecified bronchus or lung: Secondary | ICD-10-CM

## 2013-09-01 DIAGNOSIS — Z808 Family history of malignant neoplasm of other organs or systems: Secondary | ICD-10-CM

## 2013-09-01 DIAGNOSIS — C3430 Malignant neoplasm of lower lobe, unspecified bronchus or lung: Secondary | ICD-10-CM

## 2013-09-01 DIAGNOSIS — C770 Secondary and unspecified malignant neoplasm of lymph nodes of head, face and neck: Secondary | ICD-10-CM

## 2013-09-01 DIAGNOSIS — Z800 Family history of malignant neoplasm of digestive organs: Secondary | ICD-10-CM

## 2013-09-01 LAB — CBC WITH DIFFERENTIAL/PLATELET
BASO%: 0.1 % (ref 0.0–2.0)
BASOS ABS: 0 10*3/uL (ref 0.0–0.1)
EOS%: 0 % (ref 0.0–7.0)
Eosinophils Absolute: 0 10*3/uL (ref 0.0–0.5)
HEMATOCRIT: 34.6 % — AB (ref 34.8–46.6)
HGB: 11.5 g/dL — ABNORMAL LOW (ref 11.6–15.9)
LYMPH%: 10.2 % — AB (ref 14.0–49.7)
MCH: 27.5 pg (ref 25.1–34.0)
MCHC: 33.2 g/dL (ref 31.5–36.0)
MCV: 82.8 fL (ref 79.5–101.0)
MONO#: 0.7 10*3/uL (ref 0.1–0.9)
MONO%: 4.7 % (ref 0.0–14.0)
NEUT#: 12 10*3/uL — ABNORMAL HIGH (ref 1.5–6.5)
NEUT%: 85 % — AB (ref 38.4–76.8)
Platelets: 347 10*3/uL (ref 145–400)
RBC: 4.18 10*6/uL (ref 3.70–5.45)
RDW: 14.5 % (ref 11.2–14.5)
lymph#: 1.4 10*3/uL (ref 0.9–3.3)
nRBC: 0 % (ref 0–0)

## 2013-09-01 NOTE — Telephone Encounter (Signed)
gv adn pritned appt sched to pt for May...will call pt with June appt when MD respods to email

## 2013-09-01 NOTE — Progress Notes (Signed)
Checked in new pt with no financial concerns. °

## 2013-09-01 NOTE — Progress Notes (Signed)
Sherri Stafford Telephone:([phone]) [phone]   Fax:([phone]) [phone]  CONSULT NOTE  REFERRING PHYSICIAN: Dr. Christinia Stafford  REASON FOR CONSULTATION:  63 years old white female recently diagnosed with lung cancer.  HPI Sherri Stafford is a 63 y.o. female with a past medical history significant for sarcoidosis, vitamin D deficiency, GERD as well as arthritis. The patient is a former smoker for only 14 years but quit in 1986. She mentions that since 06/15/2013 she has been complaining of dry cough with chest congestion. She was seen by her primary care physician Dr. Claris Stafford and treated with cough syrup. She was also referred to Dr. Redmond Stafford with ENT and no significant abnormalities were seen on her exam. The patient was referred to Dr. Melvyn Stafford and chest x-ray performed on 06/28/2013 showed multiple bilateral pulmonary parenchymal masses of varying size consistent with metastatic malignancy. The patient was treated for GERD because of her persistent cough. Repeat chest x-ray on 08/10/2013 showed bilateral pulmonary nodules/masses measuring up to 4.1 CM in the left lower lobe that progressed compared to the prior chest x-ray and this remained worrisome for primary or metastatic malignancy. CT angiogram of the chest was performed on 08/13/2013 and it showed innumerable bilateral lung masses. The majority of masses measure in the range of 1-2 cm but there are many that measure less  than a cm and there are also several that measure greater than 2 cm. The largest mass on the right is in the anterior medial middle lobe  and measures 38 x 23 mm. On the left, the largest mass is represented by a confluent area of soft tissue opacification in the lower lobe measuring 54 x 53 x 83 (largest dimension is craniocaudal) mm. Many of the masses are circumscribed, but many also show spiculated, irregular borders. There is right supraclavicular lymphadenopathy. This corresponds to the lymph node palpated by referring  physician. This lymph node measures 15 mm in short axis. Right supraclavicular adenopathy is present, measuring 8 mm. There is widespread and relatively confluent hilar and mediastinal adenopathy. There is excessive soft tissue throughout the mediastinum including in then pretracheal and precarinal regions as well as in the subcarinal region. Short axis diameter of subcarinal adenopathy is 2.5 cm. Right superior hilar adenopathy measures 12 mm in short axis. Two enlarged inferior right hilar lymph nodes measure 10 and 17 mm in short axis, respectively. On the left, there is extensive confluent soft tissue attenuation in the hilum extending inferiorly and posteriorly as part of the consolidative process that extends to the posterior and posterior lateral pleural surface of the left lower lobe.  On 08/23/2013 Dr. Melvyn Stafford referred the patient to interventional radiology for ultrasound-guided core biopsy of the right supraclavicular lymph node. The final pathology (Accession: [id number]) was consistent with metastatic adenocarcinoma. The carcinoma demonstrates the following immunophenotype: Cytokeratin AE1/AE3 - strong diffuse expression, Cytokeratin 7 - strong diffuse expression, Cytokeratin 20 - negative expression, Cytokeratin 5/6 - negative expression, CD68 - negative expression, S100 - negative expression, TTF-1 - patchy moderate strong expression, GCDFP - negative expression, Estrogen receptor - negative expression. Overall, the morphology and immunophenotype are that of metastatic primary pulmonary adenocarcinoma. The patient is scheduled for a PET scan on 09/09/2013. Dr. Melvyn Stafford kindly referred the patient to me today for evaluation and recommendation regarding her condition. When seen today she continues to have dry cough and she is currently on codeine tablets prescribed by Dr. Melvyn Stafford which makes her sleepy but she has less cough. She denied  having any significant chest pain but has shortness of breath with  exertion and especially after frequent episodes of cough. She denied having any significant hemoptysis. The patient denied having any significant weight loss or night sweats. She has no headache or visual changes. She has been on hormone replacement therapy for the last 5 years. She has erythema of her cheeks and started 2 months ago. She also has pain on the hip and thigh.  Family history significant for a mother who died from pancreatic cancer in 2001/06/01 and father died from brain cancer in 06-01-2002. The patient is single and has no children. She was accompanied by her ex-husband Sherri Stafford. She is currently retired and used to work as part Theatre manager for third grade. She has a history of smoking one pack per day for around 14 years but quit in 01-Jun-1984. She has been on nicotine gum since that time but now she is trying Wellbutrin to discontinue the nicotine gum. She has no history of alcohol or drug abuse.   HPI  Past Medical History  Diagnosis Date  . Arthritis   . Bilateral ovarian cysts   . GERD (gastroesophageal reflux disease)   . Strain of hip flexor 06/2013    left side torn    Past Surgical History  Procedure Laterality Date  . Dilation and curettage of uterus  2002/06/01    x 3   . Tubal ligation    . Colonoscopy  04/05/2004    normal     Family History  Problem Relation Age of Onset  . Brain cancer Father   . Emphysema Father     smoked  . Pancreatic cancer Mother   . Colon cancer Neg Hx   . Breast cancer Paternal Aunt 74    Social History History  Substance Use Topics  . Smoking status: Former Smoker -- 1.00 packs/day for 14 years    Types: Cigarettes    Quit date: 04/15/1984  . Smokeless tobacco: Never Used  . Alcohol Use: No    Allergies  Allergen Reactions  . Ketoconazole Hives  . Nystatin Hives  . Sporanox [Itraconazole] Hives    Current Outpatient Prescriptions  Medication Sig Dispense Refill  . buPROPion (WELLBUTRIN XL) 150 MG 24 hr tablet Take 150 mg by mouth  daily.       . codeine 30 MG tablet Take 1 tablet (30 mg total) by mouth every 4 (four) hours as needed (cough).  40 tablet  0  . esomeprazole (NEXIUM) 40 MG capsule Take 40 mg by mouth daily.       Marland Kitchen estradiol-norethindrone (ACTIVELLA) 1-0.5 MG per tablet Take 1 tablet by mouth daily.      . polyvinyl alcohol (LIQUIFILM TEARS) 1.4 % ophthalmic solution Place 1 drop into both eyes at bedtime.      Marland Kitchen UNABLE TO FIND Nicorette lozenges daily 6-8 lozengers      . chlorpheniramine (CHLOR-TRIMETON) 2 MG/5ML syrup Take 2 mg by mouth at bedtime.        No current facility-administered medications for this visit.    Review of Systems  Constitutional: positive for fatigue Eyes: negative Ears, nose, mouth, throat, and face: negative Respiratory: positive for cough and dyspnea on exertion Cardiovascular: negative Gastrointestinal: negative Genitourinary:negative Integument/breast: negative Hematologic/lymphatic: negative Musculoskeletal:negative Neurological: negative Behavioral/Psych: positive for anxiety Endocrine: negative Allergic/Immunologic: negative  Physical Exam  GEZ:MOQHU, healthy, no distress, well nourished, well developed and anxious SKIN: skin color, texture, turgor are normal, no rashes or significant lesions HEAD:  Normocephalic, No masses, lesions, tenderness or abnormalities EYES: normal, PERRLA EARS: External ears normal, Canals clear OROPHARYNX:no exudate, no erythema and lips, buccal mucosa, and tongue normal  NECK: supple, no adenopathy, no JVD LYMPH:  no palpable lymphadenopathy, no hepatosplenomegaly BREAST:not examined LUNGS: clear to auscultation , and palpation HEART: regular rate & rhythm, no murmurs and no gallops ABDOMEN:abdomen soft, non-tender, normal bowel sounds and no masses or organomegaly BACK: Back symmetric, no curvature., No CVA tenderness EXTREMITIES:no joint deformities, effusion, or inflammation, no edema, no skin discoloration, no clubbing    NEURO: alert & oriented x 3 with fluent speech, no focal motor/sensory deficits  PERFORMANCE STATUS: ECOG 1  LABORATORY DATA: Lab Results  Component Value Date   WBC 14.1 Repeated and Verified* 09/01/2013   HGB 11.5* 09/01/2013   HCT 34.6* 09/01/2013   MCV 82.8 09/01/2013   PLT 347 09/01/2013      Chemistry      Component Value Date/Time   NA 141 07/12/2013 1009   K 4.5 07/12/2013 1009   CL 105 07/12/2013 1009   CO2 25 07/12/2013 1009   BUN 11 07/12/2013 1009   CREATININE 0.76 07/12/2013 1009      Component Value Date/Time   CALCIUM 8.7 07/12/2013 1009   ALKPHOS 51 07/12/2013 1009   AST 10 07/12/2013 1009   ALT 8 07/12/2013 1009   BILITOT 1.9* 07/12/2013 1009       RADIOGRAPHIC STUDIES: Dg Chest 2 View  08/10/2013   CLINICAL DATA:  Cough, chest pain, sarcoidosis  EXAM: CHEST  2 VIEW  COMPARISON:  06/24/2013  FINDINGS: 4.1 cm left lower lobe mass, previously 3.6 cm, worrisome for primary or metastatic malignancy.  Additional bilateral pulmonary nodules, left greater than right, mildly increased.  No pleural effusion or pneumothorax.  The heart is normal in size.  Mild degenerative changes of the visualized thoracolumbar spine.  IMPRESSION: Bilateral pulmonary nodules/masses, measuring up to 4.1 cm in the left lower lobe, progressed. This remains worrisome for primary or metastatic malignancy.  CT chest with contrast is suggested for further evaluation.   Electronically Signed   By: Julian Hy M.D.   On: 08/10/2013 16:41   Ct Angio Chest Pe W/cm &/or Wo Cm  08/12/2013   CLINICAL DATA:  Persistent dry cough since mid January, enlarged lymph node in neck, marked with BB, history of sarcoidosis with mass seen on recent CT scan, also evaluate for possibility of pulmonary embolism.  EXAM: CT ANGIOGRAPHY CHEST WITH CONTRAST  TECHNIQUE: Multidetector CT imaging of the chest was performed using the standard protocol during bolus administration of intravenous contrast. Multiplanar CT image  reconstructions and MIPs were obtained to evaluate the vascular anatomy.  CONTRAST:  78m OMNIPAQUE IOHEXOL 350 MG/ML SOLN  COMPARISON:  DG CHEST 2 VIEW dated 08/10/2013; DG CHEST 2 VIEW dated 06/24/2013  FINDINGS: There are no pulmonary emboli. There is no thoracic aortic dissection or dilatation.  There are innumerable bilateral lung masses. The majority of masses measure in the range of 1-2 cm but there are many that measure less than a cm and there are also several that measure greater than 2 cm. The largest mass on the right is in the anterior medial middle lobe and measures 38 x 23 mm. On the left, the largest mass is represented by a confluent area of soft tissue opacification in the lower lobe measuring 54 x 53 x 83 (largest dimension is craniocaudal) mm. Many of the masses are circumscribed, but many also show spiculated, irregular  borders.  There is right supraclavicular lymphadenopathy. This corresponds to the lymph node palpated by referring physician. This lymph node measures 15 mm in short axis. Right supraclavicular adenopathy is present, measuring 8 mm.  There is widespread and relatively confluent hilar and mediastinal adenopathy. There is excessive soft tissue throughout the mediastinum including in the pretracheal and precarinal regions as well as in the subcarinal region. Short axis diameter of subcarinal adenopathy is 2.5 cm. Right superior hilar adenopathy measures 12 mm in short axis. Two enlarged inferior right hilar lymph nodes measure 10 and 17 mm in short axis, respectively. On the left, there is extensive confluent soft tissue attenuation in the hilum extending inferiorly and posteriorly as part of the consolidative process that extends to the posterior and posterior lateral pleural surface of the left lower lobe. There is not an appreciable interstitial component to the pulmonary parenchymal abnormality.  There is a small left pleural effusion. There is no significant pleural effusion on  the right. There is no pericardial effusion. Scans through the upper abdomen are unremarkable. There are no acute musculoskeletal findings.  Review of the MIP images confirms the above findings.  IMPRESSION: 1. Widespread cervical, mediastinal, and hilar adenopathy. This may be due to sarcoidosis. Lymphoma and metastasis are other considerations. 2. Innumerable bilateral lung masses. While sarcoidosis is a consideration, metastasis cannot be excluded. Tissue diagnosis would be suggested and many of the long masses would be readily amenable to percutaneous CT-guided biopsy. 3. More confluent extensive opacity left lower lobe. This likely represents a combination of pulmonary mass, adenopathy, and volume loss. 4. Very small left pleural effusion 5. No pulmonary embolism   Electronically Signed   By: Skipper Cliche M.D.   On: 08/12/2013 09:52   Ir US Guide Bx Asp/drain  08/23/2013   CLINICAL DATA:  Pulmonary lesions with cervical, supraclavicular and mediastinal adenopathy. History of sarcoidosis.  EXAM: ULTRASOUND GUIDED CORE BIOPSY OF RIGHT SUPRACLAVICULAR LYMPH NODE  MEDICATIONS: 1.0 mg IV Versed; 50 mcg IV Fentanyl  Total Moderate Sedation Time: 10 min.  PROCEDURE: The procedure, risks, benefits, and alternatives were explained to the patient. Questions regarding the procedure were encouraged and answered. The patient understands and consents to the procedure.  The right neck was prepped with Betadine in a sterile fashion, and a sterile drape was applied covering the operative [place]. A sterile gown and sterile gloves were used for the procedure. Local anesthesia was provided with 1% Lidocaine.  Ultrasound was used to localize a right-sided supraclavicular/ lower cervical lymph nodes. After choosing a note for biopsy, a total of 418 gauge core biopsy samples were obtained and submitted in saline.  COMPLICATIONS: None.  FINDINGS: An enlarged right-sided supraclavicular node measuring approximately 1.8 cm was  sampled. Solid tissue was obtained.  IMPRESSION: Ultrasound-guided core biopsy performed of right supraclavicular lymph node.   Electronically Signed   By: Aletta Edouard M.D.   On: 08/23/2013 13:12   [place] Wo Cm  08/06/2013   CLINICAL DATA:  Persistent cough and sinus drainage  EXAM: CT PARANASAL SINUS WITHOUT CONTRAST  TECHNIQUE: Multidetector CT images of the paranasal sinuses were obtained using the standard protocol without intravenous contrast.  COMPARISON:  None.  FINDINGS: Paranasal sinuses are clear. There is no air-fluid level. There is no demonstrable bony destruction or expansion. The ostiomeatal unit complexes are patent bilaterally.  No intraorbital lesions are identified. Visualized brain parenchyma appears unremarkable. No evidence of fracture or dislocation. Nasal septum is minimally deviated toward the left.  Nares are not obstructed.  IMPRESSION: Paranasal sinuses are clear. Slight leftward deviation of nasal septum.   Electronically Signed   By: Lowella Grip M.D.   On: 08/06/2013 13:03    ASSESSMENT: This is a very pleasant 63 years old white female recently diagnosed with stage IV (T2a, N3, M1b) non-small cell lung cancer, adenocarcinoma presented with large left lower lobe lung mass in addition to bilateral pulmonary nodules and mediastinal and supraclavicular lymphadenopathy diagnosed in May of 2015.   PLAN: I had a lengthy discussion with the patient and her partner today about her current disease is stage, prognosis and treatment options. The patient understands that she has incurable condition and all treatment options will be off palliative nature I will complete the staging workup by ordering an MRI of the brain in addition to the already scheduled PET scan. I will also send her tissue block [facility] one for molecular biomarkers testing. I discussed with the patient several options for her treatment including palliative care and hospice referral versus  consideration of systemic chemotherapy with standard carboplatin and Alimta versus enrollment in a clinical trial according to the ECoG protocol 5508 with the patient received induction chemotherapy with carboplatin, paclitaxel and Avastin followed by maintenance treatment versus treatment with target biologic agent if she has a driver mutation like EGFR or ALK. The patient is currently asymptomatic except for the cough which is currently controlled with codeine. She would like to wait for the molecular results before proceeding with treatment. She is also interested in a second opinion and I will arrange for the patient to see Dr. Linzie Collin at [facility]. I would see her back for followup visit in 2 weeks for reevaluation and discussion of her treatment options in detail is based on the final staging workup as well as the molecular studies. She was advised to call immediately if she has any concerning symptoms in the interval. I gave the patient the time to ask questions and I answered them completely to her satisfaction. The patient voices understanding of current disease status and treatment options and is in agreement with the current care plan.  All questions were answered. The patient knows to call the clinic with any problems, questions or concerns. We can certainly see the patient much sooner if necessary.  Thank you so much for allowing me to participate in the care of Sherri Stafford. I will continue to follow up the patient with you and assist in her care.  I spent 60 minutes counseling the patient face to face. The total time spent in the appointment was 80 minutes.  Disclaimer: This note was dictated with voice recognition software. Similar sounding words can inadvertently be transcribed and may not be corrected upon review.   Curt Bears 09/01/2013, 4:36 PM

## 2013-09-02 ENCOUNTER — Telehealth: Payer: Self-pay | Admitting: Internal Medicine

## 2013-09-02 ENCOUNTER — Telehealth: Payer: Self-pay | Admitting: *Deleted

## 2013-09-02 MED ORDER — CODEINE SULFATE 30 MG PO TABS: 30.0000 mg | ORAL_TABLET | ORAL | Status: DC | PRN

## 2013-09-02 NOTE — Telephone Encounter (Signed)
lvm for pt regarding to June appt...pt ok adn aware

## 2013-09-02 NOTE — Telephone Encounter (Signed)
Pt called regarding a codeine medication refill.  Informed pt that Dr Vista Mink is out of the office and that if she needs the rx before next Tuesday 5/26 when we returns she will need to contact Dr Melvyn Novas.  She also asked if leg pain is r/t her cancer.  Spoke with Dr Vista Mink, he states that it could be related but he is waiting for the rest of her diagnostic work-up.  Pt verbalized understanding.  SLJ

## 2013-09-02 NOTE — Telephone Encounter (Signed)
Per Dr Vista Mink, okay to fax order stating "okay for pt to get teeth cleaned" on June 4th.  faxed to Dr Magnus Sinning office Attn: Margarita Grizzle [phone].  SLJ

## 2013-09-02 NOTE — Telephone Encounter (Signed)
Yes that's fine and let us know if she needs anything else to get her through to that appt

## 2013-09-02 NOTE — Telephone Encounter (Signed)
Called and spoke with pt and she stated that she called and spoke with Dr. Lew Dawes nurse and the nurse told the pt that she will need to call and speak with MW nurse to get the refill of the codeine 30 mg..  Told her to ask and see if we can refill the rx for #80 so this will last until her appt with Dr. Earlie Server on 09-17-13.  Pt is aware that MW is out of the office until in the morning and we will ask if ok to refill med.  Last refilled on 08-25-13 for #40.  MW please advise. Thanks  Allergies  Allergen Reactions  . Ketoconazole Hives  . Nystatin Hives  . Sporanox [Itraconazole] Hives     Current Outpatient Prescriptions on File Prior to Visit  Medication Sig Dispense Refill  . buPROPion (WELLBUTRIN XL) 150 MG 24 hr tablet Take 150 mg by mouth daily.       . chlorpheniramine (CHLOR-TRIMETON) 2 MG/5ML syrup Take 2 mg by mouth at bedtime.       . codeine 30 MG tablet Take 1 tablet (30 mg total) by mouth every 4 (four) hours as needed (cough).  40 tablet  0  . esomeprazole (NEXIUM) 40 MG capsule Take 40 mg by mouth daily.       Marland Kitchen estradiol-norethindrone (ACTIVELLA) 1-0.5 MG per tablet Take 1 tablet by mouth daily.      . polyvinyl alcohol (LIQUIFILM TEARS) 1.4 % ophthalmic solution Place 1 drop into both eyes at bedtime.      Marland Kitchen UNABLE TO FIND Nicorette lozenges daily 6-8 lozengers       No current facility-administered medications on file prior to visit.

## 2013-09-02 NOTE — Telephone Encounter (Signed)
rx has been placed in [facility] cabinet to sign.  Will forward to leslie to let the pt know when this is ready to be picked up.

## 2013-09-03 ENCOUNTER — Telehealth: Payer: Self-pay | Admitting: Internal Medicine

## 2013-09-03 NOTE — Telephone Encounter (Signed)
Spoke with the pt and notified rx is ready for pick up  She verbalized understanding  Nothing further needed

## 2013-09-03 NOTE — Telephone Encounter (Signed)
Pt appt. To see Dr.Frank Oretha Caprice @ [facility] is 09/10/13@8 :50. Medical records faxed.  Slides and scans will be fedex'ed. Pt is aware.   Dr. Julien Nordmann requested pt to see Dr. Sharlet Salina.    Dr. Sharlet Salina  Is a month out for any open appts. Pt needed to be seen sooner.

## 2013-09-07 ENCOUNTER — Telehealth: Payer: Self-pay | Admitting: Medical Oncology

## 2013-09-07 ENCOUNTER — Other Ambulatory Visit (HOSPITAL_COMMUNITY)
Admission: RE | Admit: 2013-09-07 | Discharge: 2013-09-07 | Disposition: A | Discharge status: Home / Self Care | Payer: BC Managed Care – PPO | Source: Ambulatory Visit | Attending: Internal Medicine | Admitting: Internal Medicine

## 2013-09-07 ENCOUNTER — Other Ambulatory Visit: Payer: Self-pay | Admitting: Internal Medicine

## 2013-09-07 ENCOUNTER — Other Ambulatory Visit: Payer: Self-pay | Admitting: Medical Oncology

## 2013-09-07 DIAGNOSIS — C3490 Malignant neoplasm of unspecified part of unspecified bronchus or lung: Secondary | ICD-10-CM | POA: Insufficient documentation

## 2013-09-07 NOTE — Telephone Encounter (Signed)
[facility] faxed lab order to [facility]

## 2013-09-07 NOTE — Telephone Encounter (Signed)
She needed number for Dr Florian Buff at [facility] and found number.

## 2013-09-08 ENCOUNTER — Ambulatory Visit
Admission: RE | Admit: 2013-09-08 | Discharge: 2013-09-08 | Disposition: A | Discharge status: Home / Self Care | Payer: BC Managed Care – PPO | Source: Ambulatory Visit | Attending: Internal Medicine | Admitting: Internal Medicine

## 2013-09-08 ENCOUNTER — Other Ambulatory Visit: Payer: Self-pay | Admitting: Internal Medicine

## 2013-09-08 DIAGNOSIS — C3490 Malignant neoplasm of unspecified part of unspecified bronchus or lung: Secondary | ICD-10-CM

## 2013-09-08 LAB — CREATININE, SERUM: Creat: 0.83 mg/dL (ref 0.50–1.10)

## 2013-09-08 MED ORDER — GADOBENATE DIMEGLUMINE 529 MG/ML IV SOLN
14.0000 mL | Freq: Once | INTRAVENOUS | Status: AC | PRN
  Administered 2013-09-08: 14 mL via INTRAVENOUS

## 2013-09-09 ENCOUNTER — Ambulatory Visit (HOSPITAL_COMMUNITY)
Admission: RE | Admit: 2013-09-09 | Discharge: 2013-09-09 | Disposition: A | Discharge status: Home / Self Care | Payer: BC Managed Care – PPO | Source: Ambulatory Visit | Attending: Internal Medicine | Admitting: Internal Medicine

## 2013-09-09 DIAGNOSIS — C3490 Malignant neoplasm of unspecified part of unspecified bronchus or lung: Secondary | ICD-10-CM

## 2013-09-09 DIAGNOSIS — J90 Pleural effusion, not elsewhere classified: Secondary | ICD-10-CM | POA: Insufficient documentation

## 2013-09-09 DIAGNOSIS — C7952 Secondary malignant neoplasm of bone marrow: Principal | ICD-10-CM

## 2013-09-09 DIAGNOSIS — C801 Malignant (primary) neoplasm, unspecified: Secondary | ICD-10-CM | POA: Insufficient documentation

## 2013-09-09 DIAGNOSIS — C7951 Secondary malignant neoplasm of bone: Secondary | ICD-10-CM | POA: Insufficient documentation

## 2013-09-09 LAB — GLUCOSE, CAPILLARY: Glucose-Capillary: 105 mg/dL — ABNORMAL HIGH (ref 70–99)

## 2013-09-09 MED ORDER — FLUDEOXYGLUCOSE F - 18 (FDG) INJECTION
8.4000 | Freq: Once | INTRAVENOUS | Status: AC | PRN
  Administered 2013-09-09: 8.4 via INTRAVENOUS

## 2013-09-09 NOTE — Progress Notes (Signed)
Quick Note:  Pt notified of results per MW ______

## 2013-09-10 ENCOUNTER — Telehealth: Payer: Self-pay | Admitting: Internal Medicine

## 2013-09-10 NOTE — Telephone Encounter (Signed)
s.w. pt to advised on appt...pt just left [facility] and has decided to stay with [facility] and cx all appt here...done

## 2013-09-15 ENCOUNTER — Ambulatory Visit
Admission: RE | Admit: 2013-09-15 | Payer: BC Managed Care – PPO | Source: Ambulatory Visit | Admitting: Radiation Oncology

## 2013-09-15 ENCOUNTER — Ambulatory Visit: Payer: BC Managed Care – PPO

## 2013-09-16 ENCOUNTER — Telehealth: Payer: Self-pay | Admitting: Medical Oncology

## 2013-09-16 NOTE — Telephone Encounter (Signed)
Left message that [facility] one has not been received and when it is it will be sent to [facility].

## 2013-09-17 ENCOUNTER — Ambulatory Visit: Payer: BC Managed Care – PPO | Admitting: Internal Medicine

## 2013-09-17 ENCOUNTER — Telehealth: Payer: Self-pay | Admitting: Medical Oncology

## 2013-09-17 NOTE — Telephone Encounter (Signed)
Requested fax copy of result to [facility] and to please fax result to Dr Joya Martyr at [phone] will fax to both providers.

## 2013-09-27 ENCOUNTER — Encounter (HOSPITAL_COMMUNITY): Payer: Self-pay

## 2013-11-14 ENCOUNTER — Emergency Department (HOSPITAL_COMMUNITY)
Admission: EM | Admit: 2013-11-14 | Discharge: 2013-11-14 | Disposition: A | Discharge status: Home / Self Care | Payer: BC Managed Care – PPO | Attending: Emergency Medicine | Admitting: Emergency Medicine

## 2013-11-14 ENCOUNTER — Encounter (HOSPITAL_COMMUNITY): Payer: Self-pay | Admitting: Emergency Medicine

## 2013-11-14 ENCOUNTER — Emergency Department (HOSPITAL_COMMUNITY): Payer: BC Managed Care – PPO

## 2013-11-14 DIAGNOSIS — K921 Melena: Secondary | ICD-10-CM | POA: Insufficient documentation

## 2013-11-14 DIAGNOSIS — K5900 Constipation, unspecified: Secondary | ICD-10-CM

## 2013-11-14 DIAGNOSIS — Z79899 Other long term (current) drug therapy: Secondary | ICD-10-CM | POA: Insufficient documentation

## 2013-11-14 DIAGNOSIS — Z87891 Personal history of nicotine dependence: Secondary | ICD-10-CM | POA: Insufficient documentation

## 2013-11-14 DIAGNOSIS — D696 Thrombocytopenia, unspecified: Secondary | ICD-10-CM

## 2013-11-14 DIAGNOSIS — M129 Arthropathy, unspecified: Secondary | ICD-10-CM | POA: Insufficient documentation

## 2013-11-14 DIAGNOSIS — Z87828 Personal history of other (healed) physical injury and trauma: Secondary | ICD-10-CM | POA: Insufficient documentation

## 2013-11-14 DIAGNOSIS — K625 Hemorrhage of anus and rectum: Secondary | ICD-10-CM | POA: Insufficient documentation

## 2013-11-14 DIAGNOSIS — Z9851 Tubal ligation status: Secondary | ICD-10-CM | POA: Insufficient documentation

## 2013-11-14 DIAGNOSIS — Z8742 Personal history of other diseases of the female genital tract: Secondary | ICD-10-CM | POA: Insufficient documentation

## 2013-11-14 DIAGNOSIS — Z9889 Other specified postprocedural states: Secondary | ICD-10-CM | POA: Insufficient documentation

## 2013-11-14 DIAGNOSIS — K219 Gastro-esophageal reflux disease without esophagitis: Secondary | ICD-10-CM | POA: Insufficient documentation

## 2013-11-14 LAB — COMPREHENSIVE METABOLIC PANEL
ALBUMIN: 2.7 g/dL — AB (ref 3.5–5.2)
ALT: 15 U/L (ref 0–35)
AST: 12 U/L (ref 0–37)
Alkaline Phosphatase: 66 U/L (ref 39–117)
Anion gap: 15 (ref 5–15)
BUN: 14 mg/dL (ref 6–23)
CALCIUM: 9.4 mg/dL (ref 8.4–10.5)
CO2: 25 meq/L (ref 19–32)
Chloride: 96 mEq/L (ref 96–112)
Creatinine, Ser: 0.65 mg/dL (ref 0.50–1.10)
GFR calc Af Amer: >90 mL/min (ref >90)
Glucose, Bld: 114 mg/dL — ABNORMAL HIGH (ref 70–99)
Potassium: 4.3 mEq/L (ref 3.7–5.3)
SODIUM: 136 meq/L — AB (ref 137–147)
Total Bilirubin: 1.4 mg/dL — ABNORMAL HIGH (ref 0.3–1.2)
Total Protein: 7 g/dL (ref 6.0–8.3)

## 2013-11-14 LAB — TYPE AND SCREEN
ABO/RH(D): O POS
Antibody Screen: NEGATIVE

## 2013-11-14 LAB — POC OCCULT BLOOD, ED: Fecal Occult Bld: POSITIVE — AB

## 2013-11-14 LAB — CBC WITH DIFFERENTIAL/PLATELET
BASOS ABS: 0 10*3/uL (ref 0.0–0.1)
Basophils Relative: 0 % (ref 0–1)
EOS PCT: 3 % (ref 0–5)
Eosinophils Absolute: 0.2 10*3/uL (ref 0.0–0.7)
HCT: 29.5 % — ABNORMAL LOW (ref 36.0–46.0)
Hemoglobin: 9.6 g/dL — ABNORMAL LOW (ref 12.0–15.0)
Lymphocytes Relative: 7 % — ABNORMAL LOW (ref 12–46)
Lymphs Abs: 0.5 10*3/uL — ABNORMAL LOW (ref 0.7–4.0)
MCH: 27 pg (ref 26.0–34.0)
MCHC: 32.5 g/dL (ref 30.0–36.0)
MCV: 83.1 fL (ref 78.0–100.0)
MONO ABS: 0.5 10*3/uL (ref 0.1–1.0)
Monocytes Relative: 7 % (ref 3–12)
NEUTROS PCT: 83 % — AB (ref 43–77)
Neutro Abs: 5.8 10*3/uL (ref 1.7–7.7)
Platelets: 84 10*3/uL — ABNORMAL LOW (ref 150–400)
RBC: 3.55 MIL/uL — AB (ref 3.87–5.11)
RDW: 19.8 % — ABNORMAL HIGH (ref 11.5–15.5)
WBC: 7 10*3/uL (ref 4.0–10.5)

## 2013-11-14 LAB — ABO/RH: ABO/RH(D): O POS

## 2013-11-14 MED ORDER — SODIUM CHLORIDE 0.9 % IV BOLUS (SEPSIS)
1000.0000 mL | Freq: Once | INTRAVENOUS | Status: AC
  Administered 2013-11-14: 1000 mL via INTRAVENOUS

## 2013-11-14 MED ORDER — SORBITOL 70 % SOLN
45.0000 mL | Freq: Once | Status: AC
  Administered 2013-11-14: 45 mL via ORAL
  Filled 2013-11-14: qty 60

## 2013-11-14 MED ORDER — IOHEXOL 300 MG/ML  SOLN
100.0000 mL | Freq: Once | INTRAMUSCULAR | Status: AC | PRN
  Administered 2013-11-14: 100 mL via INTRAVENOUS

## 2013-11-14 MED ORDER — ONDANSETRON 4 MG PO TBDP
4.0000 mg | ORAL_TABLET | Freq: Once | ORAL | Status: AC
  Administered 2013-11-14: 4 mg via ORAL
  Filled 2013-11-14: qty 1

## 2013-11-14 MED ORDER — FLEET ENEMA 7-19 GM/118ML RE ENEM
1.0000 | ENEMA | Freq: Once | RECTAL | Status: AC
  Administered 2013-11-14: 1 via RECTAL
  Filled 2013-11-14: qty 1

## 2013-11-14 MED ORDER — GLYCERIN (LAXATIVE) 2 G RE SUPP
1.0000 | Freq: Once | RECTAL | Status: DC
Start: 2013-11-14 — End: 2014-01-14

## 2013-11-14 MED ORDER — IOHEXOL 300 MG/ML  SOLN
50.0000 mL | Freq: Once | INTRAMUSCULAR | Status: AC | PRN
  Administered 2013-11-14: 50 mL via ORAL

## 2013-11-14 MED ORDER — POLYETHYLENE GLYCOL 3350 17 G PO PACK: 17.0000 g | PACK | Freq: Two times a day (BID) | ORAL | Status: DC

## 2013-11-14 NOTE — ED Notes (Signed)
Bed: VO45 Expected date:  Expected time:  Means of arrival:  Comments: Triage

## 2013-11-14 NOTE — ED Notes (Signed)
Patient started to feel nauseated.

## 2013-11-14 NOTE — ED Notes (Signed)
Blood bank ID band #[id number]

## 2013-11-14 NOTE — ED Provider Notes (Signed)
Care assumed from Dr. Tawnya Crook.  63 yo female with hx of metastatic cancer and constipation.  Plan at time of checkout was to follow up on CT scan with likely discharge if CT negative.  CT shows some rectal edema (likely from straining and DRE/disimpaction performed by Dr. Tawnya Crook.) Otherwise, CT showed known metastases.  Pt had small amount of stool out after enema and sorbitol.  On my recheck, she complained of urinary incontinence. However, when she ambulated to the bathroom (as opposed to using bedpan) she was able to void successfully.  I reviewed her labs from [facility] via [facility]. Her Hg had improved from about two weeks ago.  Her platelets had dropped some, were 84,000 today.  I discussed admission vs outpatient management with pt and husband.  Ultimately we planned for outpatient management for treatment of her constipation.  She is to call her hem/onc doctor tomorrow to discuss her platelets and to set up a recheck.  She was given return precautions including worsening bleeding, abdominal pain, fevers, or other concerning symptoms.   Clinical Impression: 1. Constipation, unspecified constipation type   2. Bright red blood per rectum   3. Thrombocytopenia       Houston Siren III, MD 11/14/13 [phone]

## 2013-11-14 NOTE — ED Notes (Signed)
Patient says that her doctor said she should have a bowel movement every three days. Patient said they increased her oxycontin. She says that this is day four for her not having a bowel movement. Patient is pooping blood. Patient said a week ago she had 2 units of blood.

## 2013-11-14 NOTE — ED Notes (Signed)
She states she has chronic constipation issues r/t taking opioids for cancer pain.  She states she recently took Colace and Senokot in attempt to produce a b.m. (she has been advised by her [facility] oncologist to by any means have a b.m. At least q 3 days).  As she was on her commode this morning, she states "for an hour and a half"; she passed a quantity of blood.  She states this occurred before, and that at that time, enemas were required to resolve the issue.  She is in no distress.

## 2013-11-14 NOTE — Discharge Instructions (Signed)
Continue your docusate and your senna as previously prescribed.  Add Miralax twice daily and try a glycerin suppository.  Constipation Constipation is when a person has fewer than three bowel movements a week, has difficulty having a bowel movement, or has stools that are dry, hard, or larger than normal. As people grow older, constipation is more common. If you try to fix constipation with medicines that make you have a bowel movement (laxatives), the problem may get worse. Long-term laxative use may cause the muscles of the colon to become weak. A low-fiber diet, not taking in enough fluids, and taking certain medicines may make constipation worse.  CAUSES   Certain medicines, such as antidepressants, pain medicine, iron supplements, antacids, and water pills.   Certain diseases, such as diabetes, irritable bowel syndrome (IBS), thyroid disease, or depression.   Not drinking enough water.   Not eating enough fiber-rich foods.   Stress or travel.   Lack of physical activity or exercise.   Ignoring the urge to have a bowel movement.   Using laxatives too much.  SIGNS AND SYMPTOMS   Having fewer than three bowel movements a week.   Straining to have a bowel movement.   Having stools that are hard, dry, or larger than normal.   Feeling full or bloated.   Pain in the lower abdomen.   Not feeling relief after having a bowel movement.  DIAGNOSIS  Your health care provider will take a medical history and perform a physical exam. Further testing may be done for severe constipation. Some tests may include:  A barium enema X-ray to examine your rectum, colon, and, sometimes, your small intestine.   A sigmoidoscopy to examine your lower colon.   A colonoscopy to examine your entire colon. TREATMENT  Treatment will depend on the severity of your constipation and what is causing it. Some dietary treatments include drinking more fluids and eating more fiber-rich foods.  Lifestyle treatments may include regular exercise. If these diet and lifestyle recommendations do not help, your health care provider may recommend taking over-the-counter laxative medicines to help you have bowel movements. Prescription medicines may be prescribed if over-the-counter medicines do not work.  HOME CARE INSTRUCTIONS   Eat foods that have a lot of fiber, such as fruits, vegetables, whole grains, and beans.  Limit foods high in fat and processed sugars, such as french fries, hamburgers, cookies, candies, and soda.   A fiber supplement may be added to your diet if you cannot get enough fiber from foods.   Drink enough fluids to keep your urine clear or pale yellow.   Exercise regularly or as directed by your health care provider.   Go to the restroom when you have the urge to go. Do not hold it.   Only take over-the-counter or prescription medicines as directed by your health care provider. Do not take other medicines for constipation without talking to your health care provider first.  [place] IF:   You have bright red blood in your stool.   Your constipation lasts for more than 4 days or gets worse.   You have abdominal or rectal pain.   You have thin, pencil-like stools.   You have unexplained weight loss. MAKE SURE YOU:   Understand these instructions.  Will watch your condition.  Will get help right away if you are not doing well or get worse. Document Released: 12/29/2003 Document Revised: 04/06/2013 Document Reviewed: 01/11/2013 [facility] Patient Information 2015 [place], [place]. This information  is not intended to replace advice given to you by your health care provider. Make sure you discuss any questions you have with your health care provider.  Gastrointestinal Bleeding Gastrointestinal (GI) bleeding means there is bleeding somewhere along the digestive tract, between the mouth and anus. CAUSES  There are many different  problems that can cause GI bleeding. Possible causes include:  Esophagitis. This is inflammation, irritation, or swelling of the esophagus.  Hemorrhoids.These are veins that are full of blood (engorged) in the rectum. They cause pain, inflammation, and may bleed.  Anal fissures.These are areas of painful tearing which may bleed. They are often caused by passing hard stool.  Diverticulosis.These are pouches that form on the colon over time, with age, and may bleed significantly.  Diverticulitis.This is inflammation in areas with diverticulosis. It can cause pain, fever, and bloody stools, although bleeding is rare.  Polyps and cancer. Colon cancer often starts out as precancerous polyps.  Gastritis and ulcers.Bleeding from the upper gastrointestinal tract (near the stomach) may travel through the intestines and produce black, sometimes tarry, often bad smelling stools. In certain cases, if the bleeding is fast enough, the stools may not be black, but red. This condition may be life-threatening. SYMPTOMS   Vomiting bright red blood or material that looks like coffee grounds.  Bloody, black, or tarry stools. DIAGNOSIS  Your caregiver may diagnose your condition by taking your history and performing a physical exam. More tests may be needed, including:  X-rays and other imaging tests.  Esophagogastroduodenoscopy (EGD). This test uses a flexible, lighted tube to look at your esophagus, stomach, and small intestine.  Colonoscopy. This test uses a flexible, lighted tube to look at your colon. TREATMENT  Treatment depends on the cause of your bleeding.   For bleeding from the esophagus, stomach, small intestine, or colon, the caregiver doing your EGD or colonoscopy may be able to stop the bleeding as part of the procedure.  Inflammation or infection of the colon can be treated with medicines.  Many rectal problems can be treated with creams, suppositories, or warm baths.  Surgery is  sometimes needed.  Blood transfusions are sometimes needed if you have lost a lot of blood. If bleeding is slow, you may be allowed to go home. If there is a lot of bleeding, you will need to stay in the hospital for observation. HOME CARE INSTRUCTIONS   Take any medicines exactly as prescribed.  Keep your stools soft by eating foods that are high in fiber. These foods include whole grains, legumes, fruits, and vegetables. Prunes (1 to 3 a day) work well for many people.  Drink enough fluids to keep your urine clear or pale yellow. SEEK IMMEDIATE MEDICAL CARE IF:   Your bleeding increases.  You feel lightheaded, weak, or you faint.  You have severe cramps in your back or abdomen.  You pass large blood clots in your stool.  Your problems are getting worse. MAKE SURE YOU:   Understand these instructions.  Will watch your condition.  Will get help right away if you are not doing well or get worse. Document Released: 03/29/2000 Document Revised: 03/18/2012 Document Reviewed: 03/11/2011 [facility] Patient Information 2015 [place], [place]. This information is not intended to replace advice given to you by your health care provider. Make sure you discuss any questions you have with your health care provider.

## 2013-11-14 NOTE — ED Provider Notes (Signed)
CSN: [id number]     Arrival date & time 11/14/13  0957 History   First MD Initiated Contact with Patient 11/14/13 1001     Chief Complaint  Patient presents with  . Rectal Bleeding     (Consider location/radiation/quality/duration/timing/severity/associated sxs/prior Treatment) HPI Comments: She states she has chronic constipation issues r/t taking opioids for cancer pain.  She states she recently took Colace and Senokot in attempt to produce a b.m. (she has been advised by her [facility] oncologist to by any means have a b.m. At least q 3 days).  As she was on her commode this morning, she states "for an hour and a half"; she passed a quantity of blood.  She states this degree of constipation has occurred before, and that at that time, enemas were required to resolve the issue.   Patient is a 63 y.o. female presenting with hematochezia. The history is provided by the patient and the spouse. No language interpreter was used.  Rectal Bleeding Quality:  Bright red Amount:  Moderate Timing:  Rare Progression:  Resolved Chronicity:  New Context: constipation and defecation   Context comment:  She reports straining to have a BM on the toilet for 90 mins when she passes BRBPR, but no stool Similar prior episodes: no   Relieved by:  Nothing Worsened by:  Nothing tried Ineffective treatments:  None tried Associated symptoms: abdominal pain   Associated symptoms: no epistaxis, no fever, no hematemesis and no vomiting   Abdominal pain:    Quality:  Aching   Severity:  Mild   Onset quality:  Unable to specify   Timing:  Constant   Chronicity:  New Risk factors comment:  Hx of stage Iv lung cancer   Past Medical History  Diagnosis Date  . Arthritis   . Bilateral ovarian cysts   . GERD (gastroesophageal reflux disease)   . Strain of hip flexor 06/2013    left side torn   Past Surgical History  Procedure Laterality Date  . Dilation and curettage of uterus  2004    x 3   . Tubal ligation     . Colonoscopy  04/05/2004    normal    Family History  Problem Relation Age of Onset  . Brain cancer Father   . Emphysema Father     smoked  . Pancreatic cancer Mother   . Colon cancer Neg Hx   . Breast cancer Paternal Aunt 49   History  Substance Use Topics  . Smoking status: Former Smoker -- 1.00 packs/day for 14 years    Types: Cigarettes    Quit date: 04/15/1984  . Smokeless tobacco: Never Used  . Alcohol Use: No   OB History   Grav Para Term Preterm Abortions TAB SAB Ect Mult Living   0              Review of Systems  Constitutional: Negative for fever, chills, diaphoresis, activity change, appetite change and fatigue.  HENT: Negative for congestion, facial swelling, nosebleeds, rhinorrhea and sore throat.   Eyes: Negative for photophobia and discharge.  Respiratory: Negative for cough, chest tightness and shortness of breath.   Cardiovascular: Negative for chest pain, palpitations and leg swelling.  Gastrointestinal: Positive for abdominal pain, blood in stool and hematochezia. Negative for nausea, vomiting, diarrhea and hematemesis.  Endocrine: Negative for polydipsia and polyuria.  Genitourinary: Negative for dysuria, frequency, difficulty urinating and pelvic pain.  Musculoskeletal: Negative for arthralgias, back pain, neck pain and neck stiffness.  Skin:  Negative for color change and wound.  Allergic/Immunologic: Negative for immunocompromised state.  Neurological: Negative for facial asymmetry, weakness, numbness and headaches.  Hematological: Does not bruise/bleed easily.  Psychiatric/Behavioral: Negative for confusion and agitation.      Allergies  Ketoconazole; Nystatin; Sporanox; and Tylenol  Home Medications   Prior to Admission medications   Medication Sig Start Date End Date Taking? Authorizing Provider  codeine 30 MG tablet Take 1 tablet (30 mg total) by mouth every 4 (four) hours as needed (cough). 09/02/13  Yes Tanda Rockers, MD  docusate  sodium (COLACE) 100 MG capsule Take 100 mg by mouth 2 (two) times daily.   Yes Historical Provider, MD  esomeprazole (NEXIUM) 40 MG capsule Take 40 mg by mouth daily as needed (for acid reflex).    Yes Historical Provider, MD  estradiol-norethindrone (ACTIVELLA) 1-0.5 MG per tablet Take 1 tablet by mouth daily.   Yes Historical Provider, MD  gabapentin (NEURONTIN) 300 MG capsule Take 300 mg by mouth 3 (three) times daily.   Yes Historical Provider, MD  oxyCODONE (OXY IR/ROXICODONE) 5 MG immediate release tablet Take 5 mg by mouth 2 (two) times daily as needed for severe pain.   Yes Historical Provider, MD  OxyCODONE (OXYCONTIN) 10 mg T12A 12 hr tablet Take 10 mg by mouth 3 (three) times daily.   Yes Historical Provider, MD  polyvinyl alcohol (LIQUIFILM TEARS) 1.4 % ophthalmic solution Place 1 drop into both eyes at bedtime.   Yes Historical Provider, MD  senna (SENOKOT) 8.6 MG tablet Take 1 tablet by mouth 4 (four) times daily.   Yes Historical Provider, MD  glycerin adult (GLYCERIN ADULT) 2 G SUPP Place 1 suppository rectally once. 11/14/13   Houston Siren III, MD  polyethylene glycol [facility]) packet Take 17 g by mouth 2 (two) times daily. 11/14/13   Houston Siren III, MD   BP 146/68  Pulse 102  Temp(Src) 98.3 F (36.8 C) (Oral)  Resp 16  SpO2 97%  LMP 01/14/2008 Physical Exam  Constitutional: She is oriented to person, place, and time. She appears well-developed and well-nourished. No distress.  HENT:  Head: Normocephalic and atraumatic.  Mouth/Throat: No oropharyngeal exudate.  Eyes: Pupils are equal, round, and reactive to light.  Neck: Normal range of motion. Neck supple.  Cardiovascular: Normal rate, regular rhythm and normal heart sounds.  Exam reveals no gallop and no friction rub.   No murmur heard. Pulmonary/Chest: Effort normal and breath sounds normal. No respiratory distress. She has no wheezes. She has no rales.  Abdominal: Soft. Bowel sounds are normal. She exhibits  no distension and no mass. There is tenderness in the suprapubic area. There is no rigidity, no rebound and no guarding.  Genitourinary: Guaiac positive stool.  Large amt of hard stool on rectal exam, BRB on glove  Musculoskeletal: Normal range of motion. She exhibits no edema and no tenderness.  Neurological: She is alert and oriented to person, place, and time.  Skin: Skin is warm and dry.  Psychiatric: She has a normal mood and affect.    ED Course  Procedures (including critical care time) Labs Review Labs Reviewed  CBC WITH DIFFERENTIAL - Abnormal; Notable for the following:    RBC 3.55 (*)    Hemoglobin 9.6 (*)    HCT 29.5 (*)    RDW 19.8 (*)    Platelets 84 (*)    Neutrophils Relative % 83 (*)    Lymphocytes Relative 7 (*)    Lymphs Abs 0.5 (*)  All other components within normal limits  COMPREHENSIVE METABOLIC PANEL - Abnormal; Notable for the following:    Sodium 136 (*)    Glucose, Bld 114 (*)    Albumin 2.7 (*)    Total Bilirubin 1.4 (*)    All other components within normal limits  POC OCCULT BLOOD, ED - Abnormal; Notable for the following:    Fecal Occult Bld POSITIVE (*)    All other components within normal limits  TYPE AND SCREEN  ABO/RH    Imaging Review Ct Abdomen Pelvis W Contrast  11/14/2013   CLINICAL DATA:  Rectal bleeding. Constipation. Taking opioids for pain related to metastatic lung cancer.  EXAM: CT ABDOMEN AND PELVIS WITH CONTRAST  TECHNIQUE: Multidetector CT imaging of the abdomen and pelvis was performed using the standard protocol following bolus administration of intravenous contrast.  CONTRAST:  41mL OMNIPAQUE IOHEXOL 300 MG/ML SOLN, 152mL OMNIPAQUE IOHEXOL 300 MG/ML SOLN  COMPARISON:  PET-CT dated 09/09/2013.  FINDINGS: Previously demonstrated consolidated left lower lobe has exam more rounded, mass-like appearance today with at least 2 rounded, low-density masses within the consolidated lung. One of these masses measures 1.9 cm in maximum  diameter and other measures 2.2 cm in maximum diameter on image 1. There is also a small to moderate amount of the pleural fluid on the left. Additional lung nodules at both lung bases have not changed significantly.  There has been interval development of 3 rounded, low-density masses in the right lobe of the liver. One of these measures 1.4 cm on image number 10, another measures 0.9 cm on image number 12 and the third measures 0.4 cm on image 12.  A large amount of stool is demonstrated with an a dilated rectum with a smaller amount of stool in normal caliber sigmoid colon. There is also perirectal edema. No small bowel or gastric dilatation is seen. There is a small hiatal hernia with sliding and paraesophageal components.  There is an oval low density area in the mid to lower right kidney measuring 1.4 x 0.9 cm on image number 12 of series 3. On the initial postcontrast images, this appears smaller and less well-defined and measures 84 Hounsfield units. On the delayed postcontrast images, this measures 64 Hounsfield units.  The spleen, pancreas, gallbladder, adrenal glands, urinary bladder, uterus and ovaries are unremarkable. No enlarged lymph nodes. The S1 vertebra is partially lumbarized. There is a lytic lesion in the anterior aspect of the S2 vertebral body measuring 1.7 cm on sagittal image number 65. There is also a lytic lesion in the posterior, inferior aspect of the L3 vertebral body on the right, measuring 1.5 cm on image number 58 of the sagittal series. There is also a lytic lesion in the anterior aspect of the left acetabulum measuring 2.1 cm on sagittal image number 93. This demonstrates cortical breakthrough medially and laterally on the coronal images, best seen on coronal image number 34.  IMPRESSION: 1. Interval visualization of metastases in the consolidated left lower lobe. 2. Otherwise, stable metastases at the lung bases. 3. Interval liver metastases. 4. Multiple bone metastases. 5. Large  amount of stool in a significantly dilated rectum with perirectal edema. 6. Small sliding and paraesophageal hiatal hernia. 7. Small to moderate-sized left pleural effusion.   Electronically Signed   By: Enrique Sack M.D.   On: 11/14/2013 16:03   Dg Abd Acute W/chest  11/14/2013   CLINICAL DATA:  Rectal bleeding. Lower abdominal pain. Ex-smoker. Lung cancer. Sarcoidosis.  EXAM: ACUTE ABDOMEN  SERIES (ABDOMEN 2 VIEW & CHEST 1 VIEW)  COMPARISON:  08/10/2013 and PET-CT dated 09/09/2013.  FINDINGS: Multiple bilateral pulmonary nodules are again demonstrated with a mild increase in size of some of the nodules. The largest nodule remains in the left lower lobe. This currently measures 4.9 cm in maximum diameter and previously measured 4.1 cm in maximum diameter on 08/02/2013.  Dense left lower lobe atelectasis is again demonstrated with mild progression. Mild increase in size of a small left pleural effusion. Interval nondisplaced right ninth lateral rib fracture. No pneumothorax. Normal sized heart.  Normal bowel gas pattern without free peritoneal air. Small rounded calcification overlying the right iliac bone. No visible by the metastases elsewhere.  IMPRESSION: 1. Mild progression of extensive metastatic disease in the lung. 2. Mild increased in the left lower lobe atelectasis and left pleural fluid. 3. Interval displaced right lateral ninth rib fracture. 4. No acute abnormality in the abdomen or pelvis.   Electronically Signed   By: Enrique Sack M.D.   On: 11/14/2013 11:57     EKG Interpretation None      MDM   Final diagnoses:  Constipation, unspecified constipation type  Bright red blood per rectum  Thrombocytopenia    Pt is a 63 y.o. female with Pmhx as above who presents with 1 episode of BRBPR after straining to have BM. She has had chronic constipation since using narcotics for pain control for her metastatic lung cancer. She was straining for long perior od time on toilet today and passed what  she describes as a large amt of BRBPR, but no BM. She describes mild abdominal discomfort which she feels is from taking the colase & sennakot. Denies fever, chills, n/v, urinary symptoms. On rectal exam, she has large amt of hard stool in rectal vault, but only small amt of stool able to be removed during attempt at digital disimpaction.  Suspect rectal bleeding due to prolong straining and hemorrhoidal bleed. Will treat constipation w/ PO sorbitol and fleet enema and reexamine abdomen.   Pt has had some stool out after sortibol & fleet enema, but still tender is lower abdomen. Stool mixed with blood per nursing report. CT ab/pelivs will be ordered to r/o obstruction. Pt's Hb 9.6 she he states is improved from last lab work by Patent examiner as she recentl was transfused around 8.  I am unsure if drop in platelts is new. Spoke w/ husband about admission for obs vs close outpt f/u if there were no emergent CT findings.  As she lives close by, he would prefer d/c as pt would like to get some sleep. Pending, CT read, I think this is reasonable. Dr. Doy Mince will f/u on CT read.       Neta Ehlers, MD 11/14/13 2056

## 2013-11-16 ENCOUNTER — Telehealth: Payer: Self-pay | Admitting: *Deleted

## 2013-11-16 NOTE — Telephone Encounter (Signed)
Pt called and left a msg stating that she is interested in returning to [facility] with Dr Vista Mink.  She has been getting tx at [facility] but she could prefer to go somewhere closer.  Called pt back, no answer, left msg asking for her to call us back.  SLJ

## 2013-11-16 NOTE — Telephone Encounter (Signed)
Pt called requesting to re-establish care with Dr Vista Mink.  She has been getting treatments at [facility] and she states it is too far to travel.  Per Dr Vista Mink, okay to schedule appt in about 4 weeks.  Pt is aware of appts on 12/15/13.

## 2013-11-24 ENCOUNTER — Other Ambulatory Visit (HOSPITAL_COMMUNITY): Payer: Self-pay | Admitting: *Deleted

## 2013-11-25 ENCOUNTER — Encounter (HOSPITAL_COMMUNITY)
Admission: RE | Admit: 2013-11-25 | Discharge: 2013-11-25 | Disposition: A | Discharge status: Home / Self Care | Payer: BC Managed Care – PPO | Source: Ambulatory Visit | Attending: Internal Medicine | Admitting: Internal Medicine

## 2013-11-25 ENCOUNTER — Other Ambulatory Visit (HOSPITAL_COMMUNITY): Payer: Self-pay | Admitting: *Deleted

## 2013-11-25 DIAGNOSIS — C3490 Malignant neoplasm of unspecified part of unspecified bronchus or lung: Secondary | ICD-10-CM | POA: Insufficient documentation

## 2013-11-25 MED ORDER — HEPARIN SOD (PORK) LOCK FLUSH 100 UNIT/ML IV SOLN
INTRAVENOUS | Status: AC
  Administered 2013-11-25: 500 [IU]
  Filled 2013-11-25: qty 5

## 2013-11-25 MED ORDER — SODIUM CHLORIDE 0.9 % IV SOLN
Freq: Every day | INTRAVENOUS | Status: DC
  Administered 2013-11-25: 13:00:00 via INTRAVENOUS

## 2013-11-25 MED ORDER — HEPARIN SOD (PORK) LOCK FLUSH 100 UNIT/ML IV SOLN: 500.0000 [IU] | INTRAVENOUS | Status: DC | PRN

## 2013-11-26 ENCOUNTER — Encounter (HOSPITAL_COMMUNITY)
Admission: RE | Admit: 2013-11-26 | Discharge: 2013-11-26 | Disposition: A | Discharge status: Home / Self Care | Payer: BC Managed Care – PPO | Source: Ambulatory Visit | Attending: Internal Medicine | Admitting: Internal Medicine

## 2013-11-26 DIAGNOSIS — C3490 Malignant neoplasm of unspecified part of unspecified bronchus or lung: Secondary | ICD-10-CM | POA: Diagnosis not present

## 2013-11-26 MED ORDER — HEPARIN SOD (PORK) LOCK FLUSH 100 UNIT/ML IV SOLN: 500.0000 [IU] | INTRAVENOUS | Status: DC | PRN

## 2013-11-26 MED ORDER — SODIUM CHLORIDE 0.9 % IV SOLN
Freq: Every day | INTRAVENOUS | Status: DC
  Administered 2013-11-26: 13:00:00 via INTRAVENOUS

## 2013-11-26 MED ORDER — HEPARIN SOD (PORK) LOCK FLUSH 100 UNIT/ML IV SOLN
INTRAVENOUS | Status: AC
  Administered 2013-11-26: 500 [IU]
  Filled 2013-11-26: qty 5

## 2013-11-29 ENCOUNTER — Encounter (HOSPITAL_COMMUNITY)
Admission: RE | Admit: 2013-11-29 | Discharge: 2013-11-29 | Disposition: A | Discharge status: Home / Self Care | Payer: BC Managed Care – PPO | Source: Ambulatory Visit | Attending: Internal Medicine | Admitting: Internal Medicine

## 2013-11-29 DIAGNOSIS — C3490 Malignant neoplasm of unspecified part of unspecified bronchus or lung: Secondary | ICD-10-CM | POA: Diagnosis not present

## 2013-11-29 MED ORDER — HEPARIN SOD (PORK) LOCK FLUSH 100 UNIT/ML IV SOLN: 500.0000 [IU] | INTRAVENOUS | Status: DC | PRN

## 2013-11-29 MED ORDER — SODIUM CHLORIDE 0.9 % IV SOLN
Freq: Every day | INTRAVENOUS | Status: DC
  Administered 2013-11-29: 13:00:00 via INTRAVENOUS

## 2013-11-29 MED ORDER — HEPARIN SOD (PORK) LOCK FLUSH 100 UNIT/ML IV SOLN
INTRAVENOUS | Status: AC
  Administered 2013-11-29: 500 [IU]
  Filled 2013-11-29: qty 5

## 2013-12-13 ENCOUNTER — Telehealth: Payer: Self-pay | Admitting: *Deleted

## 2013-12-13 NOTE — Telephone Encounter (Signed)
Pt called stating that her port she had placed at [facility] is having issues.  The upper portion of the port is hurting and the pain is radiating to her right face and ear.  Per Dr Vista Mink, pt needs to be evaluated by the MD who placed the port at [facility].  Pt verbalized understanding.  SLJ

## 2013-12-15 ENCOUNTER — Encounter: Payer: Self-pay | Admitting: Internal Medicine

## 2013-12-15 ENCOUNTER — Telehealth: Payer: Self-pay | Admitting: Internal Medicine

## 2013-12-15 ENCOUNTER — Ambulatory Visit (HOSPITAL_BASED_OUTPATIENT_CLINIC_OR_DEPARTMENT_OTHER): Payer: BC Managed Care – PPO | Admitting: Internal Medicine

## 2013-12-15 ENCOUNTER — Ambulatory Visit (HOSPITAL_BASED_OUTPATIENT_CLINIC_OR_DEPARTMENT_OTHER): Payer: BC Managed Care – PPO

## 2013-12-15 ENCOUNTER — Other Ambulatory Visit: Payer: Self-pay | Admitting: Internal Medicine

## 2013-12-15 ENCOUNTER — Telehealth: Payer: Self-pay | Admitting: *Deleted

## 2013-12-15 ENCOUNTER — Other Ambulatory Visit (HOSPITAL_BASED_OUTPATIENT_CLINIC_OR_DEPARTMENT_OTHER): Payer: BC Managed Care – PPO

## 2013-12-15 ENCOUNTER — Telehealth: Payer: Self-pay | Admitting: Medical Oncology

## 2013-12-15 VITALS — BP 123/61 | HR 100 | Temp 98.4°F | Resp 18 | Ht 63.0 in | Wt 137.4 lb

## 2013-12-15 DIAGNOSIS — C3490 Malignant neoplasm of unspecified part of unspecified bronchus or lung: Secondary | ICD-10-CM

## 2013-12-15 DIAGNOSIS — C7952 Secondary malignant neoplasm of bone marrow: Secondary | ICD-10-CM

## 2013-12-15 DIAGNOSIS — K649 Unspecified hemorrhoids: Secondary | ICD-10-CM

## 2013-12-15 DIAGNOSIS — C7951 Secondary malignant neoplasm of bone: Secondary | ICD-10-CM

## 2013-12-15 DIAGNOSIS — C7949 Secondary malignant neoplasm of other parts of nervous system: Secondary | ICD-10-CM

## 2013-12-15 DIAGNOSIS — C787 Secondary malignant neoplasm of liver and intrahepatic bile duct: Secondary | ICD-10-CM

## 2013-12-15 DIAGNOSIS — C3430 Malignant neoplasm of lower lobe, unspecified bronchus or lung: Secondary | ICD-10-CM

## 2013-12-15 DIAGNOSIS — K5900 Constipation, unspecified: Secondary | ICD-10-CM

## 2013-12-15 DIAGNOSIS — C7931 Secondary malignant neoplasm of brain: Secondary | ICD-10-CM

## 2013-12-15 DIAGNOSIS — Z95828 Presence of other vascular implants and grafts: Secondary | ICD-10-CM

## 2013-12-15 LAB — COMPREHENSIVE METABOLIC PANEL (CC13)
ALK PHOS: 70 U/L (ref 40–150)
ALT: 15 U/L (ref 0–55)
AST: 13 U/L (ref 5–34)
Albumin: 2.9 g/dL — ABNORMAL LOW (ref 3.5–5.0)
Anion Gap: 11 mEq/L (ref 3–11)
BUN: 14.6 mg/dL (ref 7.0–26.0)
CO2: 25 mEq/L (ref 22–29)
Calcium: 9.7 mg/dL (ref 8.4–10.4)
Chloride: 104 mEq/L (ref 98–109)
Creatinine: 0.9 mg/dL (ref 0.6–1.1)
Glucose: 114 mg/dl (ref 70–140)
POTASSIUM: 4.4 meq/L (ref 3.5–5.1)
SODIUM: 140 meq/L (ref 136–145)
TOTAL PROTEIN: 6.7 g/dL (ref 6.4–8.3)
Total Bilirubin: 0.68 mg/dL (ref 0.20–1.20)

## 2013-12-15 LAB — CBC WITH DIFFERENTIAL/PLATELET
BASO%: 0.2 % (ref 0.0–2.0)
Basophils Absolute: 0 10*3/uL (ref 0.0–0.1)
EOS%: 2 % (ref 0.0–7.0)
Eosinophils Absolute: 0.2 10*3/uL (ref 0.0–0.5)
HCT: 25.6 % — ABNORMAL LOW (ref 34.8–46.6)
HGB: 8 g/dL — ABNORMAL LOW (ref 11.6–15.9)
LYMPH#: 1.5 10*3/uL (ref 0.9–3.3)
LYMPH%: 12.4 % — ABNORMAL LOW (ref 14.0–49.7)
MCH: 28.2 pg (ref 25.1–34.0)
MCHC: 31.3 g/dL — ABNORMAL LOW (ref 31.5–36.0)
MCV: 90.1 fL (ref 79.5–101.0)
MONO#: 0.6 10*3/uL (ref 0.1–0.9)
MONO%: 4.9 % (ref 0.0–14.0)
NEUT%: 80.5 % — ABNORMAL HIGH (ref 38.4–76.8)
NEUTROS ABS: 9.8 10*3/uL — AB (ref 1.5–6.5)
Platelets: 202 10*3/uL (ref 145–400)
RBC: 2.84 10*6/uL — AB (ref 3.70–5.45)
RDW: 23.7 % — AB (ref 11.2–14.5)
WBC: 12.1 10*3/uL — AB (ref 3.9–10.3)

## 2013-12-15 MED ORDER — PEGFILGRASTIM INJECTION 6 MG/0.6ML: 6.0000 mg | Freq: Once | SUBCUTANEOUS | Status: DC

## 2013-12-15 MED ORDER — TRAMADOL HCL 50 MG PO TABS: 50.0000 mg | ORAL_TABLET | Freq: Four times a day (QID) | ORAL | Status: DC | PRN

## 2013-12-15 MED ORDER — SODIUM CHLORIDE 0.9 % IV SOLN: INTRAVENOUS | Status: DC

## 2013-12-15 MED ORDER — HEPARIN SOD (PORK) LOCK FLUSH 100 UNIT/ML IV SOLN
500.0000 [IU] | Freq: Once | INTRAVENOUS | Status: AC
  Administered 2013-12-15: 500 [IU] via INTRAVENOUS
  Filled 2013-12-15: qty 5

## 2013-12-15 MED ORDER — SODIUM CHLORIDE 0.9 % IJ SOLN
10.0000 mL | INTRAMUSCULAR | Status: DC | PRN
  Administered 2013-12-15: 10 mL via INTRAVENOUS
  Filled 2013-12-15: qty 10

## 2013-12-15 MED ORDER — OLANZAPINE 10 MG PO TABS
10.0000 mg | ORAL_TABLET | Freq: Every day | ORAL | Status: DC
Start: 2013-12-15 — End: 2014-01-14

## 2013-12-15 NOTE — Progress Notes (Signed)
[place] [phone]   [phone]) [phone]  OFFICE PROGRESS NOTE  [name], MD [place] [address]  DIAGNOSIS: stage IV (T2a, N3, M1b) non-small cell lung cancer, adenocarcinoma presented with large left lower lobe lung mass in addition to bilateral pulmonary nodules and mediastinal and supraclavicular lymphadenopathy diagnosed in May of 2015.  Genomic Alterations Identified? ERBB3 amplification CDK4 amplification IDH2 R172S KRAS G12D MDM2 amplification RBM10 [id number] TERC amplification - equivocal? Additional Disease-relevant Genes with No Reportable Alterations Identified? RET ALK BRAF ERBB2 MET EGFR  PRIOR THERAPY:  1) status post palliative [facility] to the left [facility]  status post stereotactic radiotherapy to brain lesions under the care of Dr. KKatherine Roanat [facility] 11/17/2013  CURRENT THERAPY:  1) Systemic chemotherapy with carboplatin for AUC of 5, Alimta 500 mg/M2 and Avastin 15 mg/KG every 3 weeks, status post 4 cycles. [facility] 2) Xgeva 120 mcg subcutaneously every 2 months.  INTERVAL HISTORY: Sherri JIMERSON63y.o. female returns to the clinic today for followup visit accompanied by a friend as well as her female partner. The patient was seen for initial evaluation at the multidisciplinary thoracic oncology clinic on 09/01/2013 after she was diagnosed with metastatic non-small cell lung cancer. [facility] by Dr. DOretha Capriceand was started on treatment with carboplatin, Alimta and Avastin status post 4 cycles. Repeat CT scan of the chest, abdomen and pelvis after cycle #3 on 11/22/2013 showed multiple bilateral pulmonary nodules and  masses compatible with metastatic disease and compared to the previous scan on 08/12/2013 many of these nodule had slightly decreased in size. There was also mediastinal and bilateral hilar lymphadenopathy slightly decreased compared to the prior scan. The left hilar and mediastinal lymph nodes remain similar in size to the prior study. There is a new fracture of the right ninth rib suspicious for a pathologic fracture. CT scan of the abdomen and pelvis on the same day showed interval increase in size of 2 out of the 3 hypodense lesions within the liver concerning for worsening metastatic disease. The lesion within the right hepatic lobe measured 1.7 x 1.6 CM compared to 1.2 x 1.1 CM on the previous scan. An additional right hepatic lesion measured 1.2 x 0.9 CM compared to 0.8 x 0.7 CM and a lesion within the left hepatic lobe measured 0.9 x 0.6 CM compared to 0.9 x 0.5 CM previously. There was no evidence for new hepatic lesion. Is unchanged lytic lesions within the L3 vertebral body and pelvis consistent with osseous metastatic disease. Dr. DOretha Capricerecommended for the patient to continue her current systemic chemotherapy with carboplatin, Alimta and Avastin. She received cycle #4 on 11/22/2013. [facility] She is here today for reevaluation and discussion of her treatment options. She had several complaints today including chronic pain in the left hip and lower back. She does not like to take narcotics because of constipation. She takes Aleve and Advil as needed for pain.  She also has severe nausea with her chemotherapy and she was intolerant to treatment with Zofran. She is currently on treatment with Zyprexa prescribed by Dr. DOretha Caprice  She requested a refill of this medication. She also has hemorrhoids and takes glycerin and Dulcolax suppository. The patient continues to have shortness of breath with exertion and dry  cough. She has significant fatigue and weakness. Her performance status has been declining recently.   MEDICAL HISTORY: Past Medical History  Diagnosis Date  . Arthritis   . Bilateral ovarian cysts   . GERD (gastroesophageal reflux disease)   . Strain of hip flexor 06/2013    left side torn    ALLERGIES:  is allergic to ketoconazole; nystatin; sporanox; tylenol; and vitamin d.  MEDICATIONS:  Current Outpatient Prescriptions  Medication Sig Dispense Refill  . codeine 30 MG tablet Take 1 tablet (30 mg total) by mouth every 4 (four) hours as needed (cough).  80 tablet  0  . docusate sodium (COLACE) 100 MG capsule Take 100 mg by mouth 2 (two) times daily.      Marland Kitchen esomeprazole (NEXIUM) 40 MG capsule Take 40 mg by mouth daily as needed (for acid reflex).       Marland Kitchen estradiol-norethindrone (ACTIVELLA) 1-0.5 MG per tablet Take 1 tablet by mouth daily.      Marland Kitchen gabapentin (NEURONTIN) 300 MG capsule Take 300 mg by mouth 3 (three) times daily.      Marland Kitchen glycerin adult (GLYCERIN ADULT) 2 G SUPP Place 1 suppository rectally once.  2 suppository  0  . OLANZapine (ZYPREXA) 10 MG tablet Take 1 tablet (10 mg total) by mouth at bedtime.  30 tablet  0  . oxyCODONE (OXY IR/ROXICODONE) 5 MG immediate release tablet Take 5 mg by mouth 2 (two) times daily as needed for severe pain.      . OxyCODONE (OXYCONTIN) 10 mg T12A 12 hr tablet Take 10 mg by mouth 3 (three) times daily.      . polyethylene glycol (MIRALAX) packet Take 17 g by mouth 2 (two) times daily.  14 each  0  . polyvinyl alcohol (LIQUIFILM TEARS) 1.4 % ophthalmic solution Place 1 drop into both eyes at bedtime.      . senna (SENOKOT) 8.6 MG tablet Take 1 tablet by mouth 4 (four) times daily.      . traMADol (ULTRAM) 50 MG tablet Take 1 tablet (50 mg total) by mouth every 6 (six) hours as needed.  60 tablet  0   No current facility-administered medications for this visit.    SURGICAL HISTORY:  Past Surgical History  Procedure Laterality Date  .  Dilation and curettage of uterus  2004    x 3   . Tubal ligation    . Colonoscopy  04/05/2004    normal     REVIEW OF SYSTEMS:  Constitutional: positive for anorexia, fatigue and weight loss Eyes: negative Ears, nose, mouth, throat, and face: negative Respiratory: positive for cough and dyspnea on exertion Cardiovascular: negative Gastrointestinal: positive for constipation and nausea Genitourinary:negative Integument/breast: negative Hematologic/lymphatic: negative Musculoskeletal:positive for muscle weakness Neurological: negative Behavioral/Psych: negative Endocrine: negative Allergic/Immunologic: negative   PHYSICAL EXAMINATION: General appearance: alert, cooperative, fatigued and no distress Head: Normocephalic, without obvious abnormality, atraumatic Neck: no adenopathy, no JVD, supple, symmetrical, trachea midline and thyroid not enlarged, symmetric, no tenderness/mass/nodules Lymph nodes: Cervical, supraclavicular, and axillary nodes normal. Resp: clear to auscultation bilaterally Back: symmetric, no curvature. ROM normal. No CVA tenderness. Cardio: regular rate and rhythm, S1, S2 normal, no murmur, click, rub or gallop GI: soft, non-tender; bowel sounds normal; no masses,  no organomegaly Extremities: extremities normal, atraumatic, no cyanosis or edema Neurologic: Alert  and oriented X 3, normal strength and tone. Normal symmetric reflexes. Normal coordination and gait  ECOG PERFORMANCE STATUS: 2 - Symptomatic, <50% confined to bed  Blood pressure 123/61, pulse 100, temperature 98.4 F (36.9 C), temperature source Oral, resp. rate 18, height '5\' 3"'  (1.6 m), weight 137 lb 6.4 oz (62.324 kg), last menstrual period 01/14/2008.  LABORATORY DATA: Lab Results  Component Value Date   WBC 12.1* 12/15/2013   HGB 8.0* 12/15/2013   HCT 25.6* 12/15/2013   MCV 90.1 12/15/2013   PLT 202 12/15/2013      Chemistry      Component Value Date/Time   NA 140 12/15/2013 0837   NA 136*  11/14/2013 1057   K 4.4 12/15/2013 0837   K 4.3 11/14/2013 1057   CL 96 11/14/2013 1057   CO2 25 12/15/2013 0837   CO2 25 11/14/2013 1057   BUN 14.6 12/15/2013 0837   BUN 14 11/14/2013 1057   CREATININE 0.9 12/15/2013 0837   CREATININE 0.65 11/14/2013 1057   CREATININE 0.83 09/07/2013 1506      Component Value Date/Time   CALCIUM 9.7 12/15/2013 0837   CALCIUM 9.4 11/14/2013 1057   ALKPHOS 70 12/15/2013 0837   ALKPHOS 66 11/14/2013 1057   AST 13 12/15/2013 0837   AST 12 11/14/2013 1057   ALT 15 12/15/2013 0837   ALT 15 11/14/2013 1057   BILITOT 0.68 12/15/2013 0837   BILITOT 1.4* 11/14/2013 1057       RADIOGRAPHIC STUDIES:  ASSESSMENT AND PLAN: This is a very pleasant 63 years old white female with:   1) stage IV non-small cell lung cancer, adenocarcinoma with metastatic disease to the bone, brain and liver. She is currently undergoing systemic chemotherapy with carboplatin, Alimta and Avastin is status post 4 cycles. She has some evidence for mild disease progression after cycle #3 especially in the liver lesions but the patient was asked to continue her treatment with the same regimen by Dr. Oretha Caprice. Her last treatment was 3 weeks ago. I will arrange for the patient to receive cycle #5 tomorrow. I will arrange for her to have repeat CT scan of the chest, abdomen and pelvis in 3 weeks for reevaluation of her disease. If she has any evidence for disease progression on the upcoming scan, I would consider the patient for a second line therapy with either immunotherapy with Nivolumab or docetaxel plus/minus Cyramza. Her previous molecular studies showed positive ERBB3 amplification. One of the options to treat this abnormality this treatment with single agent oral Afatinib.  2) metastatic bone disease: The patient will continue treatment with Xgeva subcutaneously every 2 months. I would also referred her to Dr. Alvan Dame with [facility] for evaluation and close monitoring of the left hip lesion since the patient is  planning to transfer her care to [facility].  3) pain management: I advised the patient to discontinue her current treatment with Aleve and Advil because of the increased risk for renal insufficiency and higher toxicity with Alimta. I started the patient on tramadol 50 mg by mouth every 6 hours as needed for pain.  4) constipation and hemorrhoids: She will continue her current treatment with Dulcolax suppository in addition to MiraLAX on an as-needed basis. I spent a long time with the patient and her family today reviewing her previous records as well as consultation regarding her current condition and future treatment plan. The patient has almost 3 pages of questions and I answered them completely to her satisfaction. She would come back  for followup visit in 3 weeks with the next cycle of her treatment. The patient voices understanding of current disease status and treatment options and is in agreement with the current care plan.  All questions were answered. The patient knows to call the clinic with any problems, questions or concerns. We can certainly see the patient much sooner if necessary.  I spent 40 minutes counseling the patient face to face. The total time spent in the appointment was 60 minutes.  Disclaimer: This note was dictated with voice recognition software. Similar sounding words can inadvertently be transcribed and may not be corrected upon review.

## 2013-12-15 NOTE — Telephone Encounter (Signed)
Asking for med clearance to repair a cavity and give anesthetic, EPI. I called back and reviewed allergies . Per Dr Julien Nordmann it is okay for  pt to get cavity repaired .

## 2013-12-15 NOTE — Telephone Encounter (Signed)
gv and printed appt sched and avs for pt for Sept...sed added tx.  pt sched to see Dr. Alvan Dame 11.11 @ 9:30pm

## 2013-12-15 NOTE — Patient Instructions (Signed)

## 2013-12-15 NOTE — Telephone Encounter (Signed)
Per staff message and POF I have scheduled appts. Advised scheduler of appts. JMW  

## 2013-12-16 ENCOUNTER — Other Ambulatory Visit: Payer: Self-pay | Admitting: Medical Oncology

## 2013-12-16 ENCOUNTER — Ambulatory Visit
Admission: RE | Admit: 2013-12-16 | Discharge: 2013-12-16 | Disposition: A | Discharge status: Home / Self Care | Payer: Self-pay | Source: Ambulatory Visit | Attending: Internal Medicine | Admitting: Internal Medicine

## 2013-12-16 ENCOUNTER — Other Ambulatory Visit (HOSPITAL_BASED_OUTPATIENT_CLINIC_OR_DEPARTMENT_OTHER): Payer: BC Managed Care – PPO | Admitting: Internal Medicine

## 2013-12-16 ENCOUNTER — Other Ambulatory Visit: Payer: BC Managed Care – PPO

## 2013-12-16 ENCOUNTER — Telehealth: Payer: Self-pay | Admitting: Internal Medicine

## 2013-12-16 ENCOUNTER — Other Ambulatory Visit: Payer: Self-pay | Admitting: Internal Medicine

## 2013-12-16 ENCOUNTER — Ambulatory Visit (HOSPITAL_BASED_OUTPATIENT_CLINIC_OR_DEPARTMENT_OTHER): Payer: BC Managed Care – PPO

## 2013-12-16 VITALS — BP 125/75 | HR 92 | Temp 98.1°F | Resp 18

## 2013-12-16 DIAGNOSIS — N83202 Unspecified ovarian cyst, left side: Principal | ICD-10-CM

## 2013-12-16 DIAGNOSIS — C3490 Malignant neoplasm of unspecified part of unspecified bronchus or lung: Secondary | ICD-10-CM

## 2013-12-16 DIAGNOSIS — N83201 Unspecified ovarian cyst, right side: Secondary | ICD-10-CM

## 2013-12-16 DIAGNOSIS — Z5112 Encounter for antineoplastic immunotherapy: Secondary | ICD-10-CM

## 2013-12-16 DIAGNOSIS — C3430 Malignant neoplasm of lower lobe, unspecified bronchus or lung: Secondary | ICD-10-CM

## 2013-12-16 DIAGNOSIS — C7951 Secondary malignant neoplasm of bone: Secondary | ICD-10-CM

## 2013-12-16 DIAGNOSIS — C7952 Secondary malignant neoplasm of bone marrow: Secondary | ICD-10-CM

## 2013-12-16 DIAGNOSIS — Z5111 Encounter for antineoplastic chemotherapy: Secondary | ICD-10-CM

## 2013-12-16 DIAGNOSIS — C7949 Secondary malignant neoplasm of other parts of nervous system: Secondary | ICD-10-CM

## 2013-12-16 DIAGNOSIS — C7931 Secondary malignant neoplasm of brain: Secondary | ICD-10-CM

## 2013-12-16 DIAGNOSIS — C787 Secondary malignant neoplasm of liver and intrahepatic bile duct: Secondary | ICD-10-CM

## 2013-12-16 LAB — UA PROTEIN, DIPSTICK - CHCC: Protein, ur: NEGATIVE mg/dL

## 2013-12-16 MED ORDER — ONDANSETRON 16 MG/50ML IVPB (CHCC)
INTRAVENOUS | Status: AC
  Filled 2013-12-16: qty 16

## 2013-12-16 MED ORDER — ONDANSETRON 16 MG/50ML IVPB (CHCC)
16.0000 mg | Freq: Once | INTRAVENOUS | Status: AC
Start: 2013-12-16 — End: 2013-12-16
  Administered 2013-12-16: 16 mg via INTRAVENOUS

## 2013-12-16 MED ORDER — SODIUM CHLORIDE 0.9 % IV SOLN
452.0000 mg | Freq: Once | INTRAVENOUS | Status: AC
  Administered 2013-12-16: 450 mg via INTRAVENOUS
  Filled 2013-12-16: qty 45

## 2013-12-16 MED ORDER — DEXAMETHASONE SODIUM PHOSPHATE 20 MG/5ML IJ SOLN
20.0000 mg | Freq: Once | INTRAMUSCULAR | Status: AC
  Administered 2013-12-16: 20 mg via INTRAVENOUS

## 2013-12-16 MED ORDER — SODIUM CHLORIDE 0.9 % IJ SOLN
10.0000 mL | INTRAMUSCULAR | Status: DC | PRN
  Administered 2013-12-16: 10 mL
  Filled 2013-12-16: qty 10

## 2013-12-16 MED ORDER — SODIUM CHLORIDE 0.9 % IV SOLN
500.0000 mg/m2 | Freq: Once | INTRAVENOUS | Status: AC
  Administered 2013-12-16: 825 mg via INTRAVENOUS
  Filled 2013-12-16: qty 33

## 2013-12-16 MED ORDER — HEPARIN SOD (PORK) LOCK FLUSH 100 UNIT/ML IV SOLN
500.0000 [IU] | Freq: Once | INTRAVENOUS | Status: AC | PRN
  Administered 2013-12-16: 500 [IU]
  Filled 2013-12-16: qty 5

## 2013-12-16 MED ORDER — SODIUM CHLORIDE 0.9 % IV SOLN
Freq: Once | INTRAVENOUS | Status: AC
  Administered 2013-12-16: 12:00:00 via INTRAVENOUS

## 2013-12-16 MED ORDER — SODIUM CHLORIDE 0.9 % IV SOLN
15.0000 mg/kg | Freq: Once | INTRAVENOUS | Status: AC
  Administered 2013-12-16: 925 mg via INTRAVENOUS
  Filled 2013-12-16: qty 37

## 2013-12-16 MED ORDER — DEXAMETHASONE SODIUM PHOSPHATE 20 MG/5ML IJ SOLN
INTRAMUSCULAR | Status: AC
  Filled 2013-12-16: qty 5

## 2013-12-16 NOTE — Patient Instructions (Signed)
[place] Discharge Instructions for Patients Receiving Chemotherapy  Today you received the following chemotherapy agents Alimta, Carbo, and Avastin To help prevent nausea and vomiting after your treatment, we encourage you to take your nausea medication Olanzapine 10 mg at bedtime.  If you develop nausea and vomiting that is not controlled by your nausea medication, call the clinic.   BELOW ARE SYMPTOMS THAT SHOULD BE REPORTED IMMEDIATELY:  *FEVER GREATER THAN 100.5 F  *CHILLS WITH OR WITHOUT FEVER  NAUSEA AND VOMITING THAT IS NOT CONTROLLED WITH YOUR NAUSEA MEDICATION  *UNUSUAL SHORTNESS OF BREATH  *UNUSUAL BRUISING OR BLEEDING  TENDERNESS IN MOUTH AND THROAT WITH OR WITHOUT PRESENCE OF ULCERS  *URINARY PROBLEMS  *BOWEL PROBLEMS  UNUSUAL RASH Items with * indicate a potential emergency and should be followed up as soon as possible.  Feel free to call the clinic you have any questions or concerns. The clinic phone number is [phone].

## 2013-12-16 NOTE — Telephone Encounter (Signed)
gv adn printed appt sched and av sfo rpt for SEpt...sed added tx.

## 2013-12-17 ENCOUNTER — Other Ambulatory Visit: Payer: Self-pay | Admitting: Physician Assistant

## 2013-12-17 ENCOUNTER — Telehealth: Payer: Self-pay | Admitting: *Deleted

## 2013-12-17 ENCOUNTER — Other Ambulatory Visit: Payer: Self-pay | Admitting: *Deleted

## 2013-12-17 ENCOUNTER — Ambulatory Visit: Payer: BC Managed Care – PPO

## 2013-12-17 ENCOUNTER — Ambulatory Visit (HOSPITAL_BASED_OUTPATIENT_CLINIC_OR_DEPARTMENT_OTHER): Payer: BC Managed Care – PPO

## 2013-12-17 VITALS — BP 146/72 | HR 95 | Temp 98.1°F | Resp 18

## 2013-12-17 DIAGNOSIS — Z5189 Encounter for other specified aftercare: Secondary | ICD-10-CM

## 2013-12-17 DIAGNOSIS — C7951 Secondary malignant neoplasm of bone: Secondary | ICD-10-CM

## 2013-12-17 DIAGNOSIS — C7952 Secondary malignant neoplasm of bone marrow: Secondary | ICD-10-CM

## 2013-12-17 DIAGNOSIS — C3490 Malignant neoplasm of unspecified part of unspecified bronchus or lung: Secondary | ICD-10-CM

## 2013-12-17 DIAGNOSIS — C3430 Malignant neoplasm of lower lobe, unspecified bronchus or lung: Secondary | ICD-10-CM

## 2013-12-17 MED ORDER — PEGFILGRASTIM INJECTION 6 MG/0.6ML
6.0000 mg | Freq: Once | SUBCUTANEOUS | Status: AC
  Administered 2013-12-17: 6 mg via SUBCUTANEOUS
  Filled 2013-12-17: qty 0.6

## 2013-12-17 MED ORDER — SODIUM CHLORIDE 0.9 % IV SOLN
1000.0000 mL | INTRAVENOUS | Status: DC
  Administered 2013-12-17: 1000 mL via INTRAVENOUS

## 2013-12-17 MED ORDER — SODIUM CHLORIDE 0.9 % IJ SOLN
10.0000 mL | Freq: Once | INTRAMUSCULAR | Status: AC
  Administered 2013-12-17: 10 mL
  Filled 2013-12-17: qty 10

## 2013-12-17 MED ORDER — DENOSUMAB 120 MG/1.7ML ~~LOC~~ SOLN
120.0000 mg | Freq: Once | SUBCUTANEOUS | Status: AC
  Administered 2013-12-17: 120 mg via SUBCUTANEOUS
  Filled 2013-12-17: qty 1.7

## 2013-12-17 MED ORDER — HEPARIN SOD (PORK) LOCK FLUSH 100 UNIT/ML IV SOLN
500.0000 [IU] | Freq: Once | INTRAVENOUS | Status: AC
  Administered 2013-12-17: 500 [IU]
  Filled 2013-12-17: qty 5

## 2013-12-17 NOTE — Telephone Encounter (Signed)
Pt here for IVF, Neulasta, and  Xgeva post chemo 12/16/13.   Stated eating well, and drinking lots of fluids as tolerated.  Denied nausea/vomiting; denied mouth sores.  Denied pain.  Bowel and bladder function fine.   Pt aware of next returned appts.

## 2013-12-17 NOTE — Patient Instructions (Signed)
Dehydration, Adult Dehydration is when you lose more fluids from the body than you take in. Vital organs like the kidneys, brain, and heart cannot function without a proper amount of fluids and salt. Any loss of fluids from the body can cause dehydration.  CAUSES   Vomiting.  Diarrhea.  Excessive sweating.  Excessive urine output.  Fever. SYMPTOMS  Mild dehydration  Thirst.  Dry lips.  Slightly dry mouth. Moderate dehydration  Very dry mouth.  Sunken eyes.  Skin does not bounce back quickly when lightly pinched and released.  Dark urine and decreased urine production.  Decreased tear production.  Headache. Severe dehydration  Very dry mouth.  Extreme thirst.  Rapid, weak pulse (more than 100 beats per minute at rest).  Cold hands and feet.  Not able to sweat in spite of heat and temperature.  Rapid breathing.  Blue lips.  Confusion and lethargy.  Difficulty being awakened.  Minimal urine production.  No tears. DIAGNOSIS  Your caregiver will diagnose dehydration based on your symptoms and your exam. Blood and urine tests will help confirm the diagnosis. The diagnostic evaluation should also identify the cause of dehydration. TREATMENT  Treatment of mild or moderate dehydration can often be done at home by increasing the amount of fluids that you drink. It is best to drink small amounts of fluid more often. Drinking too much at one time can make vomiting worse. Refer to the home care instructions below. Severe dehydration needs to be treated at the hospital where you will probably be given intravenous (IV) fluids that contain water and electrolytes. HOME CARE INSTRUCTIONS   Ask your caregiver about specific rehydration instructions.  Drink enough fluids to keep your urine clear or pale yellow.  Drink small amounts frequently if you have nausea and vomiting.  Eat as you normally do.  Avoid:  Foods or drinks high in sugar.  Carbonated  drinks.  Juice.  Extremely hot or cold fluids.  Drinks with caffeine.  Fatty, greasy foods.  Alcohol.  Tobacco.  Overeating.  Gelatin desserts.  Wash your hands well to avoid spreading bacteria and viruses.  Only take over-the-counter or prescription medicines for pain, discomfort, or fever as directed by your caregiver.  Ask your caregiver if you should continue all prescribed and over-the-counter medicines.  Keep all follow-up appointments with your caregiver. SEEK MEDICAL CARE IF:  You have abdominal pain and it increases or stays in one area (localizes).  You have a rash, stiff neck, or severe headache.  You are irritable, sleepy, or difficult to awaken.  You are weak, dizzy, or extremely thirsty. SEEK IMMEDIATE MEDICAL CARE IF:   You are unable to keep fluids down or you get worse despite treatment.  You have frequent episodes of vomiting or diarrhea.  You have blood or green matter (bile) in your vomit.  You have blood in your stool or your stool looks black and tarry.  You have not urinated in 6 to 8 hours, or you have only urinated a small amount of very dark urine.  You have a fever.  You faint. MAKE SURE YOU:   Understand these instructions.  Will watch your condition.  Will get help right away if you are not doing well or get worse. Document Released: 04/01/2005 Document Revised: 06/24/2011 Document Reviewed: 11/19/2010 ExitCare Patient Information 2015 [employer]. This information is not intended to replace advice given to you by your health care provider. Make sure you discuss any questions you have with your health care   provider.  Denosumab injection What is this medicine? DENOSUMAB (den oh Citlally mab) slows bone breakdown. Prolia is used to treat osteoporosis in women after menopause and in men. Delton See is used to prevent bone fractures and other bone problems caused by cancer bone metastases. Delton See is also used to treat giant cell tumor of  the bone. This medicine may be used for other purposes; ask your health care provider or pharmacist if you have questions. COMMON BRAND NAME(S): Prolia, XGEVA What should I tell my health care provider before I take this medicine? They need to know if you have any of these conditions: -dental disease -eczema -infection or history of infections -kidney disease or on dialysis -low blood calcium or vitamin D -malabsorption syndrome -scheduled to have surgery or tooth extraction -taking medicine that contains denosumab -thyroid or parathyroid disease -an unusual reaction to denosumab, other medicines, foods, dyes, or preservatives -pregnant or trying to get pregnant -breast-feeding How should I use this medicine? This medicine is for injection under the skin. It is given by a health care professional in a hospital or clinic setting. If you are getting Prolia, a special MedGuide will be given to you by the pharmacist with each prescription and refill. Be sure to read this information carefully each time. For Prolia, talk to your pediatrician regarding the use of this medicine in children. Special care may be needed. For Delton See, talk to your pediatrician regarding the use of this medicine in children. While this drug may be prescribed for children as young as 13 years for selected conditions, precautions do apply. Overdosage: If you think you've taken too much of this medicine contact a poison control center or emergency room at once. Overdosage: If you think you have taken too much of this medicine contact a poison control center or emergency room at once. NOTE: This medicine is only for you. Do not share this medicine with others. What if I miss a dose? It is important not to miss your dose. Call your doctor or health care professional if you are unable to keep an appointment. What may interact with this medicine? Do not take this medicine with any of the following medications: -other  medicines containing denosumab This medicine may also interact with the following medications: -medicines that suppress the immune system -medicines that treat cancer -steroid medicines like prednisone or cortisone This list may not describe all possible interactions. Give your health care provider a list of all the medicines, herbs, non-prescription drugs, or dietary supplements you use. Also tell them if you smoke, drink alcohol, or use illegal drugs. Some items may interact with your medicine. What should I watch for while using this medicine? Visit your doctor or health care professional for regular checks on your progress. Your doctor or health care professional may order blood tests and other tests to see how you are doing. Call your doctor or health care professional if you get a cold or other infection while receiving this medicine. Do not treat yourself. This medicine may decrease your body's ability to fight infection. You should make sure you get enough calcium and vitamin D while you are taking this medicine, unless your doctor tells you not to. Discuss the foods you eat and the vitamins you take with your health care professional. See your dentist regularly. Brush and floss your teeth as directed. Before you have any dental work done, tell your dentist you are receiving this medicine. Do not become pregnant while taking this medicine or for 5  months after stopping it. Women should inform their doctor if they wish to become pregnant or think they might be pregnant. There is a potential for serious side effects to an unborn child. Talk to your health care professional or pharmacist for more information. What side effects may I notice from receiving this medicine? Side effects that you should report to your doctor or health care professional as soon as possible: -allergic reactions like skin rash, itching or hives, swelling of the face, lips, or tongue -breathing problems -chest  pain -fast, irregular heartbeat -feeling faint or lightheaded, falls -fever, chills, or any other sign of infection -muscle spasms, tightening, or twitches -numbness or tingling -skin blisters or bumps, or is dry, peels, or red -slow healing or unexplained pain in the mouth or jaw -unusual bleeding or bruising Side effects that usually do not require medical attention (Report these to your doctor or health care professional if they continue or are bothersome.): -muscle pain -stomach upset, gas This list may not describe all possible side effects. Call your doctor for medical advice about side effects. You may report side effects to FDA at 1-800-FDA-1088. Where should I keep my medicine? This medicine is only given in a clinic, doctor's office, or other health care setting and will not be stored at home. NOTE: This sheet is a summary. It may not cover all possible information. If you have questions about this medicine, talk to your doctor, pharmacist, or health care provider.  2015, Elsevier/Gold Standard. (2011-09-30 12:37:47)  Pegfilgrastim injection What is this medicine? PEGFILGRASTIM (peg fil GRA stim) is a long-acting granulocyte colony-stimulating factor that stimulates the growth of neutrophils, a type of white blood cell important in the body's fight against infection. It is used to reduce the incidence of fever and infection in patients with certain types of cancer who are receiving chemotherapy that affects the bone marrow. This medicine may be used for other purposes; ask your health care provider or pharmacist if you have questions. COMMON BRAND NAME(S): Neulasta What should I tell my health care provider before I take this medicine? They need to know if you have any of these conditions: -latex allergy -ongoing radiation therapy -sickle cell disease -skin reactions to acrylic adhesives (On-Body Injector only) -an unusual or allergic reaction to pegfilgrastim, filgrastim,  other medicines, foods, dyes, or preservatives -pregnant or trying to get pregnant -breast-feeding How should I use this medicine? This medicine is for injection under the skin. If you get this medicine at home, you will be taught how to prepare and give the pre-filled syringe or how to use the On-body Injector. Refer to the patient Instructions for Use for detailed instructions. Use exactly as directed. Take your medicine at regular intervals. Do not take your medicine more often than directed. It is important that you put your used needles and syringes in a special sharps container. Do not put them in a trash can. If you do not have a sharps container, call your pharmacist or healthcare provider to get one. Talk to your pediatrician regarding the use of this medicine in children. Special care may be needed. Overdosage: If you think you have taken too much of this medicine contact a poison control center or emergency room at once. NOTE: This medicine is only for you. Do not share this medicine with others. What if I miss a dose? It is important not to miss your dose. Call your doctor or health care professional if you miss your dose. If you miss a  dose due to an On-body Injector failure or leakage, a new dose should be administered as soon as possible using a single prefilled syringe for manual use. What may interact with this medicine? Interactions have not been studied. Give your health care provider a list of all the medicines, herbs, non-prescription drugs, or dietary supplements you use. Also tell them if you smoke, drink alcohol, or use illegal drugs. Some items may interact with your medicine. This list may not describe all possible interactions. Give your health care provider a list of all the medicines, herbs, non-prescription drugs, or dietary supplements you use. Also tell them if you smoke, drink alcohol, or use illegal drugs. Some items may interact with your medicine. What should I  watch for while using this medicine? You may need blood work done while you are taking this medicine. If you are going to need a MRI, CT scan, or other procedure, tell your doctor that you are using this medicine (On-Body Injector only). What side effects may I notice from receiving this medicine? Side effects that you should report to your doctor or health care professional as soon as possible: -allergic reactions like skin rash, itching or hives, swelling of the face, lips, or tongue -dizziness -fever -pain, redness, or irritation at site where injected -pinpoint red spots on the skin -shortness of breath or breathing problems -stomach or side pain, or pain at the shoulder -swelling -tiredness -trouble passing urine Side effects that usually do not require medical attention (report to your doctor or health care professional if they continue or are bothersome): -bone pain -muscle pain This list may not describe all possible side effects. Call your doctor for medical advice about side effects. You may report side effects to FDA at 1-800-FDA-1088. Where should I keep my medicine? Keep out of the reach of children. Store pre-filled syringes in a refrigerator between 2 and 8 degrees C (36 and 46 degrees F). Do not freeze. Keep in carton to protect from light. Throw away this medicine if it is left out of the refrigerator for more than 48 hours. Throw away any unused medicine after the expiration date. NOTE: This sheet is a summary. It may not cover all possible information. If you have questions about this medicine, talk to your doctor, pharmacist, or health care provider.  2015, [employer]vier/[employer] Standard. (2013-07-01 16:14:05)

## 2013-12-17 NOTE — Telephone Encounter (Signed)
Patient here for IV infusion with injection.  Treatment Nurse Elray Buba RN informed this nurse she will assess for chemotherapy f/u.

## 2013-12-17 NOTE — Telephone Encounter (Signed)
Message copied by Cherylynn Ridges on Fri Dec 17, 2013  3:05 PM ------      Message from: [place], [place] P      Created: Thu Dec 16, 2013  2:58 PM      Regarding: chemo follow-up       1st Alimta, Carbo, Avastin.  Dr. Julien Nordmann.  Coming in 9/4 for IVF, Inj ------

## 2013-12-18 ENCOUNTER — Ambulatory Visit (HOSPITAL_BASED_OUTPATIENT_CLINIC_OR_DEPARTMENT_OTHER): Payer: BC Managed Care – PPO

## 2013-12-18 VITALS — BP 140/49 | HR 94 | Temp 97.9°F | Resp 20

## 2013-12-18 DIAGNOSIS — C7951 Secondary malignant neoplasm of bone: Secondary | ICD-10-CM

## 2013-12-18 DIAGNOSIS — R110 Nausea: Secondary | ICD-10-CM

## 2013-12-18 DIAGNOSIS — C3490 Malignant neoplasm of unspecified part of unspecified bronchus or lung: Secondary | ICD-10-CM

## 2013-12-18 DIAGNOSIS — C3430 Malignant neoplasm of lower lobe, unspecified bronchus or lung: Secondary | ICD-10-CM

## 2013-12-18 DIAGNOSIS — C7952 Secondary malignant neoplasm of bone marrow: Secondary | ICD-10-CM

## 2013-12-18 MED ORDER — HEPARIN SOD (PORK) LOCK FLUSH 100 UNIT/ML IV SOLN
500.0000 [IU] | Freq: Once | INTRAVENOUS | Status: AC
  Administered 2013-12-18: 500 [IU] via INTRAVENOUS
  Filled 2013-12-18: qty 5

## 2013-12-18 MED ORDER — SODIUM CHLORIDE 0.9 % IV SOLN
Freq: Once | INTRAVENOUS | Status: AC
  Administered 2013-12-18: 09:00:00 via INTRAVENOUS

## 2013-12-18 MED ORDER — SODIUM CHLORIDE 0.9 % IJ SOLN
10.0000 mL | INTRAMUSCULAR | Status: DC | PRN
  Administered 2013-12-18: 10 mL via INTRAVENOUS
  Filled 2013-12-18: qty 10

## 2013-12-18 NOTE — Progress Notes (Signed)
Pt c/o nausea, took a nausea pill this morning.  Pt also c/o pain in right jaw, 6/10, took a home prn tramadol to help with pain.  SLJ

## 2013-12-18 NOTE — Patient Instructions (Signed)
Dehydration, Adult Dehydration is when you lose more fluids from the body than you take in. Vital organs like the kidneys, brain, and heart cannot function without a proper amount of fluids and salt. Any loss of fluids from the body can cause dehydration.  CAUSES   Vomiting.  Diarrhea.  Excessive sweating.  Excessive urine output.  Fever. SYMPTOMS  Mild dehydration  Thirst.  Dry lips.  Slightly dry mouth. Moderate dehydration  Very dry mouth.  Sunken eyes.  Skin does not bounce back quickly when lightly pinched and released.  Dark urine and decreased urine production.  Decreased tear production.  Headache. Severe dehydration  Very dry mouth.  Extreme thirst.  Rapid, weak pulse (more than 100 beats per minute at rest).  Cold hands and feet.  Not able to sweat in spite of heat and temperature.  Rapid breathing.  Blue lips.  Confusion and lethargy.  Difficulty being awakened.  Minimal urine production.  No tears. DIAGNOSIS  Your caregiver will diagnose dehydration based on your symptoms and your exam. Blood and urine tests will help confirm the diagnosis. The diagnostic evaluation should also identify the cause of dehydration. TREATMENT  Treatment of mild or moderate dehydration can often be done at home by increasing the amount of fluids that you drink. It is best to drink small amounts of fluid more often. Drinking too much at one time can make vomiting worse. Refer to the home care instructions below. Severe dehydration needs to be treated at the hospital where you will probably be given intravenous (IV) fluids that contain water and electrolytes. HOME CARE INSTRUCTIONS   Ask your caregiver about specific rehydration instructions.  Drink enough fluids to keep your urine clear or pale yellow.  Drink small amounts frequently if you have nausea and vomiting.  Eat as you normally do.  Avoid:  Foods or drinks high in sugar.  Carbonated  drinks.  Juice.  Extremely hot or cold fluids.  Drinks with caffeine.  Fatty, greasy foods.  Alcohol.  Tobacco.  Overeating.  Gelatin desserts.  Wash your hands well to avoid spreading bacteria and viruses.  Only take over-the-counter or prescription medicines for pain, discomfort, or fever as directed by your caregiver.  Ask your caregiver if you should continue all prescribed and over-the-counter medicines.  Keep all follow-up appointments with your caregiver. SEEK MEDICAL CARE IF:  You have abdominal pain and it increases or stays in one area (localizes).  You have a rash, stiff neck, or severe headache.  You are irritable, sleepy, or difficult to awaken.  You are weak, dizzy, or extremely thirsty. SEEK IMMEDIATE MEDICAL CARE IF:   You are unable to keep fluids down or you get worse despite treatment.  You have frequent episodes of vomiting or diarrhea.  You have blood or green matter (bile) in your vomit.  You have blood in your stool or your stool looks black and tarry.  You have not urinated in 6 to 8 hours, or you have only urinated a small amount of very dark urine.  You have a fever.  You faint. MAKE SURE YOU:   Understand these instructions.  Will watch your condition.  Will get help right away if you are not doing well or get worse. Document Released: 04/01/2005 Document Revised: 06/24/2011 Document Reviewed: 11/19/2010 ExitCare Patient Information 2015 [employer]. This information is not intended to replace advice given to you by your health care provider. Make sure you discuss any questions you have with your health care   provider.  

## 2013-12-21 ENCOUNTER — Encounter: Payer: Self-pay | Admitting: Obstetrics & Gynecology

## 2013-12-21 ENCOUNTER — Telehealth: Payer: Self-pay | Admitting: Medical Oncology

## 2013-12-21 ENCOUNTER — Ambulatory Visit (HOSPITAL_BASED_OUTPATIENT_CLINIC_OR_DEPARTMENT_OTHER): Payer: BC Managed Care – PPO

## 2013-12-21 ENCOUNTER — Ambulatory Visit: Payer: BC Managed Care – PPO

## 2013-12-21 VITALS — BP 132/64 | HR 100 | Temp 98.4°F | Resp 20

## 2013-12-21 DIAGNOSIS — Z5189 Encounter for other specified aftercare: Secondary | ICD-10-CM

## 2013-12-21 DIAGNOSIS — C3490 Malignant neoplasm of unspecified part of unspecified bronchus or lung: Secondary | ICD-10-CM

## 2013-12-21 MED ORDER — SODIUM CHLORIDE 0.9 % IV SOLN
Freq: Once | INTRAVENOUS | Status: AC
  Administered 2013-12-21: 13:00:00 via INTRAVENOUS

## 2013-12-21 NOTE — Telephone Encounter (Signed)
question about port -Sherri Stafford talked to [facility] about plan.

## 2013-12-21 NOTE — Patient Instructions (Signed)
Dehydration, Adult Dehydration is when you lose more fluids from the body than you take in. Vital organs like the kidneys, brain, and heart cannot function without a proper amount of fluids and salt. Any loss of fluids from the body can cause dehydration.  CAUSES   Vomiting.  Diarrhea.  Excessive sweating.  Excessive urine output.  Fever. SYMPTOMS  Mild dehydration  Thirst.  Dry lips.  Slightly dry mouth. Moderate dehydration  Very dry mouth.  Sunken eyes.  Skin does not bounce back quickly when lightly pinched and released.  Dark urine and decreased urine production.  Decreased tear production.  Headache. Severe dehydration  Very dry mouth.  Extreme thirst.  Rapid, weak pulse (more than 100 beats per minute at rest).  Cold hands and feet.  Not able to sweat in spite of heat and temperature.  Rapid breathing.  Blue lips.  Confusion and lethargy.  Difficulty being awakened.  Minimal urine production.  No tears. DIAGNOSIS  Your caregiver will diagnose dehydration based on your symptoms and your exam. Blood and urine tests will help confirm the diagnosis. The diagnostic evaluation should also identify the cause of dehydration. TREATMENT  Treatment of mild or moderate dehydration can often be done at home by increasing the amount of fluids that you drink. It is best to drink small amounts of fluid more often. Drinking too much at one time can make vomiting worse. Refer to the home care instructions below. Severe dehydration needs to be treated at the hospital where you will probably be given intravenous (IV) fluids that contain water and electrolytes. HOME CARE INSTRUCTIONS   Ask your caregiver about specific rehydration instructions.  Drink enough fluids to keep your urine clear or pale yellow.  Drink small amounts frequently if you have nausea and vomiting.  Eat as you normally do.  Avoid:  Foods or drinks high in sugar.  Carbonated  drinks.  Juice.  Extremely hot or cold fluids.  Drinks with caffeine.  Fatty, greasy foods.  Alcohol.  Tobacco.  Overeating.  Gelatin desserts.  Wash your hands well to avoid spreading bacteria and viruses.  Only take over-the-counter or prescription medicines for pain, discomfort, or fever as directed by your caregiver.  Ask your caregiver if you should continue all prescribed and over-the-counter medicines.  Keep all follow-up appointments with your caregiver. SEEK MEDICAL CARE IF:  You have abdominal pain and it increases or stays in one area (localizes).  You have a rash, stiff neck, or severe headache.  You are irritable, sleepy, or difficult to awaken.  You are weak, dizzy, or extremely thirsty. SEEK IMMEDIATE MEDICAL CARE IF:   You are unable to keep fluids down or you get worse despite treatment.  You have frequent episodes of vomiting or diarrhea.  You have blood or green matter (bile) in your vomit.  You have blood in your stool or your stool looks black and tarry.  You have not urinated in 6 to 8 hours, or you have only urinated a small amount of very dark urine.  You have a fever.  You faint. MAKE SURE YOU:   Understand these instructions.  Will watch your condition.  Will get help right away if you are not doing well or get worse. Document Released: 04/01/2005 Document Revised: 06/24/2011 Document Reviewed: 11/19/2010 ExitCare Patient Information 2015 [employer]. This information is not intended to replace advice given to you by your health care provider. Make sure you discuss any questions you have with your health care   provider.  

## 2013-12-23 ENCOUNTER — Ambulatory Visit (HOSPITAL_COMMUNITY)
Admission: RE | Admit: 2013-12-23 | Discharge: 2013-12-23 | Disposition: A | Discharge status: Home / Self Care | Payer: BC Managed Care – PPO | Source: Ambulatory Visit | Attending: Internal Medicine | Admitting: Internal Medicine

## 2013-12-23 ENCOUNTER — Other Ambulatory Visit (HOSPITAL_BASED_OUTPATIENT_CLINIC_OR_DEPARTMENT_OTHER): Payer: BC Managed Care – PPO

## 2013-12-23 ENCOUNTER — Ambulatory Visit (HOSPITAL_BASED_OUTPATIENT_CLINIC_OR_DEPARTMENT_OTHER): Payer: BC Managed Care – PPO

## 2013-12-23 ENCOUNTER — Other Ambulatory Visit: Payer: Self-pay | Admitting: *Deleted

## 2013-12-23 ENCOUNTER — Telehealth: Payer: Self-pay | Admitting: *Deleted

## 2013-12-23 DIAGNOSIS — T451X5A Adverse effect of antineoplastic and immunosuppressive drugs, initial encounter: Secondary | ICD-10-CM | POA: Insufficient documentation

## 2013-12-23 DIAGNOSIS — Z452 Encounter for adjustment and management of vascular access device: Secondary | ICD-10-CM

## 2013-12-23 DIAGNOSIS — D6481 Anemia due to antineoplastic chemotherapy: Secondary | ICD-10-CM

## 2013-12-23 DIAGNOSIS — C3430 Malignant neoplasm of lower lobe, unspecified bronchus or lung: Secondary | ICD-10-CM

## 2013-12-23 DIAGNOSIS — Z95828 Presence of other vascular implants and grafts: Secondary | ICD-10-CM

## 2013-12-23 DIAGNOSIS — C3490 Malignant neoplasm of unspecified part of unspecified bronchus or lung: Secondary | ICD-10-CM

## 2013-12-23 LAB — COMPREHENSIVE METABOLIC PANEL (CC13)
ALT: 26 U/L (ref 0–55)
AST: 23 U/L (ref 5–34)
Albumin: 2.9 g/dL — ABNORMAL LOW (ref 3.5–5.0)
Alkaline Phosphatase: 88 U/L (ref 40–150)
Anion Gap: 10 mEq/L (ref 3–11)
BILIRUBIN TOTAL: 0.87 mg/dL (ref 0.20–1.20)
BUN: 14.2 mg/dL (ref 7.0–26.0)
CO2: 23 meq/L (ref 22–29)
Calcium: 8.3 mg/dL — ABNORMAL LOW (ref 8.4–10.4)
Chloride: 106 mEq/L (ref 98–109)
Creatinine: 0.8 mg/dL (ref 0.6–1.1)
GLUCOSE: 119 mg/dL (ref 70–140)
Potassium: 3.6 mEq/L (ref 3.5–5.1)
Sodium: 140 mEq/L (ref 136–145)
TOTAL PROTEIN: 6.4 g/dL (ref 6.4–8.3)

## 2013-12-23 LAB — CBC WITH DIFFERENTIAL/PLATELET
BASO%: 0.1 % (ref 0.0–2.0)
BASOS ABS: 0 10*3/uL (ref 0.0–0.1)
EOS%: 1.5 % (ref 0.0–7.0)
Eosinophils Absolute: 0.2 10*3/uL (ref 0.0–0.5)
HEMATOCRIT: 20.9 % — AB (ref 34.8–46.6)
HGB: 6.7 g/dL — CL (ref 11.6–15.9)
LYMPH%: 7.2 % — ABNORMAL LOW (ref 14.0–49.7)
MCH: 28.6 pg (ref 25.1–34.0)
MCHC: 32.1 g/dL (ref 31.5–36.0)
MCV: 89.3 fL (ref 79.5–101.0)
MONO#: 0.6 10*3/uL (ref 0.1–0.9)
MONO%: 4.8 % (ref 0.0–14.0)
NEUT%: 86.4 % — AB (ref 38.4–76.8)
NEUTROS ABS: 11.6 10*3/uL — AB (ref 1.5–6.5)
Platelets: 84 10*3/uL — ABNORMAL LOW (ref 145–400)
RBC: 2.34 10*6/uL — ABNORMAL LOW (ref 3.70–5.45)
RDW: 22.3 % — ABNORMAL HIGH (ref 11.2–14.5)
WBC: 13.4 10*3/uL — AB (ref 3.9–10.3)
lymph#: 1 10*3/uL (ref 0.9–3.3)

## 2013-12-23 MED ORDER — HEPARIN SOD (PORK) LOCK FLUSH 100 UNIT/ML IV SOLN
500.0000 [IU] | Freq: Once | INTRAVENOUS | Status: AC
  Administered 2013-12-23: 500 [IU] via INTRAVENOUS
  Filled 2013-12-23: qty 5

## 2013-12-23 MED ORDER — SODIUM CHLORIDE 0.9 % IJ SOLN
10.0000 mL | INTRAMUSCULAR | Status: DC | PRN
  Administered 2013-12-23: 10 mL via INTRAVENOUS
  Filled 2013-12-23: qty 10

## 2013-12-23 NOTE — Progress Notes (Signed)
Hbg 6.7.  Per dr Vista Mink pt needs 2 units of blood. Called and spoke to pt, she states she is fatigued.  She verbalized understanding regarding appts tomorrow for transfusion.  SLJ

## 2013-12-23 NOTE — Telephone Encounter (Signed)
Per staff message and POF I have scheduled appts.desk RN notified . JMW

## 2013-12-23 NOTE — Patient Instructions (Signed)

## 2013-12-24 ENCOUNTER — Ambulatory Visit (HOSPITAL_BASED_OUTPATIENT_CLINIC_OR_DEPARTMENT_OTHER): Payer: BC Managed Care – PPO

## 2013-12-24 ENCOUNTER — Encounter: Payer: Self-pay | Admitting: *Deleted

## 2013-12-24 ENCOUNTER — Ambulatory Visit: Payer: BC Managed Care – PPO

## 2013-12-24 ENCOUNTER — Other Ambulatory Visit: Payer: BC Managed Care – PPO

## 2013-12-24 VITALS — BP 140/73 | HR 89 | Temp 98.5°F | Resp 20

## 2013-12-24 DIAGNOSIS — D6481 Anemia due to antineoplastic chemotherapy: Secondary | ICD-10-CM

## 2013-12-24 DIAGNOSIS — C3490 Malignant neoplasm of unspecified part of unspecified bronchus or lung: Secondary | ICD-10-CM

## 2013-12-24 DIAGNOSIS — Z95828 Presence of other vascular implants and grafts: Secondary | ICD-10-CM

## 2013-12-24 DIAGNOSIS — T451X5A Adverse effect of antineoplastic and immunosuppressive drugs, initial encounter: Secondary | ICD-10-CM | POA: Diagnosis not present

## 2013-12-24 LAB — PREPARE RBC (CROSSMATCH)

## 2013-12-24 MED ORDER — DIPHENHYDRAMINE HCL 25 MG PO CAPS
ORAL_CAPSULE | ORAL | Status: AC
  Filled 2013-12-24: qty 1

## 2013-12-24 MED ORDER — SODIUM CHLORIDE 0.9 % IJ SOLN
10.0000 mL | INTRAMUSCULAR | Status: DC | PRN
  Administered 2013-12-24: 10 mL via INTRAVENOUS
  Filled 2013-12-24: qty 10

## 2013-12-24 MED ORDER — DIPHENHYDRAMINE HCL 25 MG PO CAPS
25.0000 mg | ORAL_CAPSULE | Freq: Once | ORAL | Status: AC
  Administered 2013-12-24: 25 mg via ORAL

## 2013-12-24 MED ORDER — SODIUM CHLORIDE 0.9 % IV SOLN
250.0000 mL | Freq: Once | INTRAVENOUS | Status: AC
  Administered 2013-12-24: 250 mL via INTRAVENOUS

## 2013-12-24 MED ORDER — HEPARIN SOD (PORK) LOCK FLUSH 100 UNIT/ML IV SOLN
500.0000 [IU] | Freq: Every day | INTRAVENOUS | Status: AC | PRN
  Administered 2013-12-24: 500 [IU]
  Filled 2013-12-24: qty 5

## 2013-12-24 MED ORDER — SODIUM CHLORIDE 0.9 % IJ SOLN
10.0000 mL | INTRAMUSCULAR | Status: AC | PRN
  Administered 2013-12-24: 10 mL
  Filled 2013-12-24: qty 10

## 2013-12-24 NOTE — Patient Instructions (Signed)

## 2013-12-24 NOTE — Progress Notes (Signed)
Patient complaining of nausea.   Discussed use of essential oils/aromatherapy with patient and she is willing to try.   Offered patient ginger-orange, lavender and peppermint.  She has a clear preference for peppermint.  After just a few minutes of smelling the peppermint, patient states her nausea is much better.

## 2013-12-24 NOTE — Patient Instructions (Signed)

## 2013-12-25 LAB — TYPE AND SCREEN
ABO/RH(D): O POS
Antibody Screen: NEGATIVE
UNIT DIVISION: 0
Unit division: 0

## 2013-12-30 ENCOUNTER — Other Ambulatory Visit: Payer: BC Managed Care – PPO

## 2013-12-31 ENCOUNTER — Other Ambulatory Visit (HOSPITAL_BASED_OUTPATIENT_CLINIC_OR_DEPARTMENT_OTHER): Payer: BC Managed Care – PPO

## 2013-12-31 ENCOUNTER — Ambulatory Visit (HOSPITAL_BASED_OUTPATIENT_CLINIC_OR_DEPARTMENT_OTHER): Payer: BC Managed Care – PPO

## 2013-12-31 ENCOUNTER — Ambulatory Visit (HOSPITAL_COMMUNITY)
Admission: RE | Admit: 2013-12-31 | Discharge: 2013-12-31 | Disposition: A | Discharge status: Home / Self Care | Payer: BC Managed Care – PPO | Source: Ambulatory Visit | Attending: Internal Medicine | Admitting: Internal Medicine

## 2013-12-31 ENCOUNTER — Encounter (HOSPITAL_COMMUNITY): Payer: Self-pay

## 2013-12-31 DIAGNOSIS — C787 Secondary malignant neoplasm of liver and intrahepatic bile duct: Secondary | ICD-10-CM | POA: Insufficient documentation

## 2013-12-31 DIAGNOSIS — C3490 Malignant neoplasm of unspecified part of unspecified bronchus or lung: Secondary | ICD-10-CM | POA: Diagnosis present

## 2013-12-31 DIAGNOSIS — R599 Enlarged lymph nodes, unspecified: Secondary | ICD-10-CM | POA: Insufficient documentation

## 2013-12-31 DIAGNOSIS — C7952 Secondary malignant neoplasm of bone marrow: Secondary | ICD-10-CM

## 2013-12-31 DIAGNOSIS — C7951 Secondary malignant neoplasm of bone: Secondary | ICD-10-CM

## 2013-12-31 DIAGNOSIS — C3430 Malignant neoplasm of lower lobe, unspecified bronchus or lung: Secondary | ICD-10-CM

## 2013-12-31 DIAGNOSIS — Z95828 Presence of other vascular implants and grafts: Secondary | ICD-10-CM

## 2013-12-31 DIAGNOSIS — K5900 Constipation, unspecified: Secondary | ICD-10-CM | POA: Diagnosis not present

## 2013-12-31 DIAGNOSIS — R1031 Right lower quadrant pain: Secondary | ICD-10-CM | POA: Diagnosis not present

## 2013-12-31 DIAGNOSIS — R0602 Shortness of breath: Secondary | ICD-10-CM | POA: Diagnosis present

## 2013-12-31 HISTORY — DX: Malignant (primary) neoplasm, unspecified: C80.1

## 2013-12-31 LAB — CBC WITH DIFFERENTIAL/PLATELET
BASO%: 0.2 % (ref 0.0–2.0)
BASOS ABS: 0 10*3/uL (ref 0.0–0.1)
EOS%: 0.5 % (ref 0.0–7.0)
Eosinophils Absolute: 0.1 10*3/uL (ref 0.0–0.5)
HCT: 28.6 % — ABNORMAL LOW (ref 34.8–46.6)
HGB: 9.4 g/dL — ABNORMAL LOW (ref 11.6–15.9)
LYMPH%: 3.6 % — AB (ref 14.0–49.7)
MCH: 29.2 pg (ref 25.1–34.0)
MCHC: 32.8 g/dL (ref 31.5–36.0)
MCV: 89 fL (ref 79.5–101.0)
MONO#: 1 10*3/uL — ABNORMAL HIGH (ref 0.1–0.9)
MONO%: 6.2 % (ref 0.0–14.0)
NEUT#: 14.1 10*3/uL — ABNORMAL HIGH (ref 1.5–6.5)
NEUT%: 89.5 % — ABNORMAL HIGH (ref 38.4–76.8)
PLATELETS: 146 10*3/uL (ref 145–400)
RBC: 3.21 10*6/uL — ABNORMAL LOW (ref 3.70–5.45)
RDW: 18.5 % — AB (ref 11.2–14.5)
WBC: 15.8 10*3/uL — ABNORMAL HIGH (ref 3.9–10.3)
lymph#: 0.6 10*3/uL — ABNORMAL LOW (ref 0.9–3.3)

## 2013-12-31 LAB — COMPREHENSIVE METABOLIC PANEL (CC13)
ALBUMIN: 2.9 g/dL — AB (ref 3.5–5.0)
ALK PHOS: 74 U/L (ref 40–150)
ALT: 14 U/L (ref 0–55)
AST: 11 U/L (ref 5–34)
Anion Gap: 11 mEq/L (ref 3–11)
BILIRUBIN TOTAL: 0.87 mg/dL (ref 0.20–1.20)
BUN: 13.2 mg/dL (ref 7.0–26.0)
CO2: 25 mEq/L (ref 22–29)
CREATININE: 0.7 mg/dL (ref 0.6–1.1)
Calcium: 9.8 mg/dL (ref 8.4–10.4)
Chloride: 97 mEq/L — ABNORMAL LOW (ref 98–109)
GLUCOSE: 123 mg/dL (ref 70–140)
POTASSIUM: 5.2 meq/L — AB (ref 3.5–5.1)
Sodium: 132 mEq/L — ABNORMAL LOW (ref 136–145)
Total Protein: 7.1 g/dL (ref 6.4–8.3)

## 2013-12-31 MED ORDER — HEPARIN SOD (PORK) LOCK FLUSH 100 UNIT/ML IV SOLN
500.0000 [IU] | Freq: Once | INTRAVENOUS | Status: AC
  Administered 2013-12-31: 500 [IU] via INTRAVENOUS
  Filled 2013-12-31: qty 5

## 2013-12-31 MED ORDER — SODIUM CHLORIDE 0.9 % IJ SOLN
10.0000 mL | INTRAMUSCULAR | Status: DC | PRN
  Administered 2013-12-31: 10 mL via INTRAVENOUS
  Filled 2013-12-31: qty 10

## 2013-12-31 MED ORDER — IOHEXOL 300 MG/ML  SOLN
100.0000 mL | Freq: Once | INTRAMUSCULAR | Status: AC | PRN
  Administered 2013-12-31: 100 mL via INTRAVENOUS

## 2013-12-31 NOTE — Patient Instructions (Signed)

## 2014-01-04 ENCOUNTER — Telehealth: Payer: Self-pay | Admitting: *Deleted

## 2014-01-04 NOTE — Telephone Encounter (Signed)
Pt called left vm stating she wants to see if she can take 2 of her tramadol tablets at a time for her back pain.  Called pt back to discuss pain medications further.  No answer. Left a msg to call back

## 2014-01-05 ENCOUNTER — Other Ambulatory Visit: Payer: Self-pay | Admitting: *Deleted

## 2014-01-05 ENCOUNTER — Telehealth: Payer: Self-pay | Admitting: Physician Assistant

## 2014-01-05 ENCOUNTER — Telehealth: Payer: Self-pay | Admitting: *Deleted

## 2014-01-05 NOTE — Telephone Encounter (Signed)
Pt called wanting to know if she can take 2 tramadol tabs for her back pain.  She does not take any form of oxycodone at this time.  Per Dr Vista Mink okay to take 2 tabs, and will evaluate her at f/u appt tomorrow.  Pt verbalized understanding

## 2014-01-05 NOTE — Telephone Encounter (Signed)
m, °

## 2014-01-06 ENCOUNTER — Encounter: Payer: Self-pay | Admitting: Physician Assistant

## 2014-01-06 ENCOUNTER — Telehealth: Payer: Self-pay | Admitting: *Deleted

## 2014-01-06 ENCOUNTER — Ambulatory Visit (HOSPITAL_BASED_OUTPATIENT_CLINIC_OR_DEPARTMENT_OTHER): Payer: BC Managed Care – PPO

## 2014-01-06 ENCOUNTER — Ambulatory Visit: Payer: BC Managed Care – PPO

## 2014-01-06 ENCOUNTER — Other Ambulatory Visit: Payer: Self-pay | Admitting: *Deleted

## 2014-01-06 ENCOUNTER — Ambulatory Visit (HOSPITAL_BASED_OUTPATIENT_CLINIC_OR_DEPARTMENT_OTHER): Payer: BC Managed Care – PPO | Admitting: Physician Assistant

## 2014-01-06 ENCOUNTER — Telehealth: Payer: Self-pay | Admitting: Internal Medicine

## 2014-01-06 ENCOUNTER — Other Ambulatory Visit (HOSPITAL_BASED_OUTPATIENT_CLINIC_OR_DEPARTMENT_OTHER): Payer: BC Managed Care – PPO

## 2014-01-06 VITALS — BP 128/82 | HR 109 | Temp 97.8°F | Resp 18 | Ht 63.0 in | Wt 130.1 lb

## 2014-01-06 DIAGNOSIS — C787 Secondary malignant neoplasm of liver and intrahepatic bile duct: Secondary | ICD-10-CM

## 2014-01-06 DIAGNOSIS — C3430 Malignant neoplasm of lower lobe, unspecified bronchus or lung: Secondary | ICD-10-CM

## 2014-01-06 DIAGNOSIS — E860 Dehydration: Secondary | ICD-10-CM

## 2014-01-06 DIAGNOSIS — R110 Nausea: Secondary | ICD-10-CM

## 2014-01-06 DIAGNOSIS — R5381 Other malaise: Secondary | ICD-10-CM

## 2014-01-06 DIAGNOSIS — Z95828 Presence of other vascular implants and grafts: Secondary | ICD-10-CM

## 2014-01-06 DIAGNOSIS — C7951 Secondary malignant neoplasm of bone: Secondary | ICD-10-CM

## 2014-01-06 DIAGNOSIS — C3490 Malignant neoplasm of unspecified part of unspecified bronchus or lung: Secondary | ICD-10-CM

## 2014-01-06 DIAGNOSIS — Z79899 Other long term (current) drug therapy: Secondary | ICD-10-CM

## 2014-01-06 DIAGNOSIS — C7949 Secondary malignant neoplasm of other parts of nervous system: Secondary | ICD-10-CM

## 2014-01-06 DIAGNOSIS — C7931 Secondary malignant neoplasm of brain: Secondary | ICD-10-CM

## 2014-01-06 DIAGNOSIS — K5900 Constipation, unspecified: Secondary | ICD-10-CM

## 2014-01-06 DIAGNOSIS — C7952 Secondary malignant neoplasm of bone marrow: Secondary | ICD-10-CM

## 2014-01-06 DIAGNOSIS — K649 Unspecified hemorrhoids: Secondary | ICD-10-CM

## 2014-01-06 DIAGNOSIS — R5383 Other fatigue: Secondary | ICD-10-CM

## 2014-01-06 DIAGNOSIS — R197 Diarrhea, unspecified: Secondary | ICD-10-CM

## 2014-01-06 LAB — COMPREHENSIVE METABOLIC PANEL (CC13)
ALT: 12 U/L (ref 0–55)
AST: 12 U/L (ref 5–34)
Albumin: 2.7 g/dL — ABNORMAL LOW (ref 3.5–5.0)
Alkaline Phosphatase: 79 U/L (ref 40–150)
Anion Gap: 12 mEq/L — ABNORMAL HIGH (ref 3–11)
BILIRUBIN TOTAL: 0.86 mg/dL (ref 0.20–1.20)
BUN: 23.8 mg/dL (ref 7.0–26.0)
CO2: 23 meq/L (ref 22–29)
Calcium: 9.2 mg/dL (ref 8.4–10.4)
Chloride: 101 mEq/L (ref 98–109)
Creatinine: 0.9 mg/dL (ref 0.6–1.1)
Glucose: 166 mg/dl — ABNORMAL HIGH (ref 70–140)
Potassium: 4.1 mEq/L (ref 3.5–5.1)
SODIUM: 136 meq/L (ref 136–145)
TOTAL PROTEIN: 7.3 g/dL (ref 6.4–8.3)

## 2014-01-06 LAB — CBC WITH DIFFERENTIAL/PLATELET
BASO%: 1.1 % (ref 0.0–2.0)
Basophils Absolute: 0.2 10*3/uL — ABNORMAL HIGH (ref 0.0–0.1)
EOS%: 0.1 % (ref 0.0–7.0)
Eosinophils Absolute: 0 10*3/uL (ref 0.0–0.5)
HCT: 29.6 % — ABNORMAL LOW (ref 34.8–46.6)
HGB: 9.8 g/dL — ABNORMAL LOW (ref 11.6–15.9)
LYMPH%: 6.5 % — ABNORMAL LOW (ref 14.0–49.7)
MCH: 29.2 pg (ref 25.1–34.0)
MCHC: 33 g/dL (ref 31.5–36.0)
MCV: 88.5 fL (ref 79.5–101.0)
MONO#: 0.7 10*3/uL (ref 0.1–0.9)
MONO%: 3.7 % (ref 0.0–14.0)
NEUT#: 17.1 10*3/uL — ABNORMAL HIGH (ref 1.5–6.5)
NEUT%: 88.6 % — ABNORMAL HIGH (ref 38.4–76.8)
Platelets: 271 10*3/uL (ref 145–400)
RBC: 3.35 10*6/uL — AB (ref 3.70–5.45)
RDW: 18.5 % — ABNORMAL HIGH (ref 11.2–14.5)
WBC: 19.3 10*3/uL — AB (ref 3.9–10.3)
lymph#: 1.3 10*3/uL (ref 0.9–3.3)

## 2014-01-06 MED ORDER — HEPARIN SOD (PORK) LOCK FLUSH 100 UNIT/ML IV SOLN
500.0000 [IU] | Freq: Once | INTRAVENOUS | Status: AC
  Administered 2014-01-06: 500 [IU] via INTRAVENOUS
  Filled 2014-01-06: qty 5

## 2014-01-06 MED ORDER — SODIUM CHLORIDE 0.9 % IJ SOLN
10.0000 mL | INTRAMUSCULAR | Status: DC | PRN
  Administered 2014-01-06: 10 mL via INTRAVENOUS
  Filled 2014-01-06: qty 10

## 2014-01-06 MED ORDER — OLANZAPINE 10 MG PO TABS: 10.0000 mg | ORAL_TABLET | Freq: Every day | ORAL | Status: DC

## 2014-01-06 MED ORDER — SODIUM CHLORIDE 0.9 % IV SOLN
Freq: Once | INTRAVENOUS | Status: AC
  Administered 2014-01-06: 13:00:00 via INTRAVENOUS

## 2014-01-06 MED ORDER — METHYLPREDNISOLONE (PAK) 4 MG PO TABS: ORAL_TABLET | ORAL | Status: DC

## 2014-01-06 NOTE — Patient Instructions (Addendum)
Dehydration, Adult Dehydration is when you lose more fluids from the body than you take in. Vital organs like the kidneys, brain, and heart cannot function without a proper amount of fluids and salt. Any loss of fluids from the body can cause dehydration.  CAUSES   Vomiting.  Diarrhea.  Excessive sweating.  Excessive urine output.  Fever. SYMPTOMS  Mild dehydration  Thirst.  Dry lips.  Slightly dry mouth. Moderate dehydration  Very dry mouth.  Sunken eyes.  Skin does not bounce back quickly when lightly pinched and released.  Dark urine and decreased urine production.  Decreased tear production.  Headache. Severe dehydration  Very dry mouth.  Extreme thirst.  Rapid, weak pulse (more than 100 beats per minute at rest).  Cold hands and feet.  Not able to sweat in spite of heat and temperature.  Rapid breathing.  Blue lips.  Confusion and lethargy.  Difficulty being awakened.  Minimal urine production.  No tears. DIAGNOSIS  Your caregiver will diagnose dehydration based on your symptoms and your exam. Blood and urine tests will help confirm the diagnosis. The diagnostic evaluation should also identify the cause of dehydration. TREATMENT  Treatment of mild or moderate dehydration can often be done at home by increasing the amount of fluids that you drink. It is best to drink small amounts of fluid more often. Drinking too much at one time can make vomiting worse. Refer to the home care instructions below. Severe dehydration needs to be treated at the hospital where you will probably be given intravenous (IV) fluids that contain water and electrolytes. HOME CARE INSTRUCTIONS   Ask your caregiver about specific rehydration instructions.  Drink enough fluids to keep your urine clear or pale yellow.  Drink small amounts frequently if you have nausea and vomiting.  Eat as you normally do.  Avoid:  Foods or drinks high in sugar.  Carbonated  drinks.  Juice.  Extremely hot or cold fluids.  Drinks with caffeine.  Fatty, greasy foods.  Alcohol.  Tobacco.  Overeating.  Gelatin desserts.  Wash your hands well to avoid spreading bacteria and viruses.  Only take over-the-counter or prescription medicines for pain, discomfort, or fever as directed by your caregiver.  Ask your caregiver if you should continue all prescribed and over-the-counter medicines.  Keep all follow-up appointments with your caregiver. SEEK MEDICAL CARE IF:  You have abdominal pain and it increases or stays in one area (localizes).  You have a rash, stiff neck, or severe headache.  You are irritable, sleepy, or difficult to awaken.  You are weak, dizzy, or extremely thirsty. SEEK IMMEDIATE MEDICAL CARE IF:   You are unable to keep fluids down or you get worse despite treatment.  You have frequent episodes of vomiting or diarrhea.  You have blood or green matter (bile) in your vomit.  You have blood in your stool or your stool looks black and tarry.  You have not urinated in 6 to 8 hours, or you have only urinated a small amount of very dark urine.  You have a fever.  You faint. MAKE SURE YOU:   Understand these instructions.  Will watch your condition.  Will get help right away if you are not doing well or get worse. Document Released: 04/01/2005 Document Revised: 06/24/2011 Document Reviewed: 11/19/2010 [employer] Patient Information 2015 [employer]. This information is not intended to replace advice given to you by your health care provider. Make sure you discuss any questions you have with your health care   provider.  Nivolumab injection What is this medicine? NIVOLUMAB (nye VOL ue mab) is used to treat certain types of melanoma and lung cancer. This medicine may be used for other purposes; ask your health care provider or pharmacist if you have questions. COMMON BRAND NAME(S): Opdivo What should I tell my health care  provider before I take this medicine? They need to know if you have any of these conditions: -eye disease, vision problems -history of pancreatitis -immune system problems -inflammatory bowel disease -kidney disease -liver disease -lung disease -lupus -myasthenia gravis -multiple sclerosis -organ transplant -stomach or intestine problems -thyroid disease -tingling of the fingers or toes, or other nerve disorder -an unusual or allergic reaction to nivolumab, other medicines, foods, dyes, or preservatives -pregnant or trying to get pregnant -breast-feeding How should I use this medicine? This medicine is for infusion into a vein. It is given by a health care professional in a hospital or clinic setting. A special MedGuide will be given to you before each treatment. Be sure to read this information carefully each time. Talk to your pediatrician regarding the use of this medicine in children. Special care may be needed. Overdosage: If you think you've taken too much of this medicine contact a poison control center or emergency room at once. Overdosage: If you think you have taken too much of this medicine contact a poison control center or emergency room at once. NOTE: This medicine is only for you. Do not share this medicine with others. What if I miss a dose? It is important not to miss your dose. Call your doctor or health care professional if you are unable to keep an appointment. What may interact with this medicine? Interactions have not been studied. This list may not describe all possible interactions. Give your health care provider a list of all the medicines, herbs, non-prescription drugs, or dietary supplements you use. Also tell them if you smoke, drink alcohol, or use illegal drugs. Some items may interact with your medicine. What should I watch for while using this medicine? Tell your doctor or healthcare professional if your symptoms do not start to get better or if they  get worse. Your condition will be monitored carefully while you are receiving this medicine. You may need blood work done while you are taking this medicine. What side effects may I notice from receiving this medicine? Side effects that you should report to your doctor or health care professional as soon as possible: -allergic reactions like skin rash, itching or hives, swelling of the face, lips, or tongue -black, tarry stools -bloody or watery diarrhea -changes in vision -chills -cough -depressed mood -eye pain -feeling anxious -fever -general ill feeling or flu-like symptoms -hair loss -loss of appetite -low blood counts - this medicine may decrease the number of white blood cells, red blood cells and platelets. You may be at increased risk for infections and bleeding -pain, tingling, numbness in the hands or feet -redness, blistering, peeling or loosening of the skin, including inside the mouth -red pinpoint spots on skin -signs of decreased platelets or bleeding - bruising, pinpoint red spots on the skin, black, tarry stools, blood in the urine -signs of decreased red blood cells - unusually weak or tired, feeling faint or lightheaded, falls -signs of infection - fever or chills, cough, sore throat, pain or trouble passing urine -signs and symptoms of a dangerous change in heartbeat or heart rhythm like chest pain; dizziness; fast or irregular heartbeat; palpitations; feeling faint or lightheaded, falls;  breathing problems -signs and symptoms of high blood sugar such as dizziness; dry mouth; dry skin; fruity breath; nausea; stomach pain; increased hunger or thirst; increased urination -signs and symptoms of kidney injury like trouble passing urine or change in the amount of urine -signs and symptoms of liver injury like dark yellow or brown urine; general ill feeling or flu-like symptoms; light-colored stools; loss of appetite; nausea; right upper belly pain; unusually weak or tired;  yellowing of the eyes or skin -signs and symptoms of increased potassium like muscle weakness; chest pain; or fast, irregular heartbeat -signs and symptoms of low potassium like muscle cramps or muscle pain; chest pain; dizziness; feeling faint or lightheaded, falls; palpitations; breathing problems; or fast, irregular heartbeat -swelling of the ankles, feet, hands -weight gainSide effects that usually do not require medical attention (report to your doctor or health care professional if they continue or are bothersome): -constipation -general ill feeling or flu-like symptoms -hair loss -loss of appetite -nausea, vomiting This list may not describe all possible side effects. Call your doctor for medical advice about side effects. You may report side effects to FDA at 1-800-FDA-1088. Where should I keep my medicine? This drug is given in a hospital or clinic and will not be stored at home. NOTE: This sheet is a summary. It may not cover all possible information. If you have questions about this medicine, talk to your doctor, pharmacist, or health care provider.  2015, [employer]. (2013-06-21 13:18:19)

## 2014-01-06 NOTE — Patient Instructions (Signed)

## 2014-01-06 NOTE — Progress Notes (Signed)
Sherri Stafford [phone]   [phone]  SHARED VISIT PROGRESS NOTE  [name], MD [place] [address]  DIAGNOSIS: stage IV (T2a, N3, M1b) non-small cell lung cancer, adenocarcinoma presented with large left lower lobe lung mass in addition to bilateral pulmonary nodules and mediastinal and supraclavicular lymphadenopathy diagnosed in May of 2015.  Genomic Alterations Identified? ERBB3 amplification CDK4 amplification IDH2 R172S KRAS G12D MDM2 amplification RBM10 [id number] TERC amplification - equivocal? Additional Disease-relevant Genes with No Reportable Alterations Identified? RET ALK BRAF ERBB2 MET EGFR  PRIOR THERAPY:  1) status post palliative [facility] the left [facility]  status post stereotactic radiotherapy to brain lesions under the care of Dr. [place]atherine Ro[place]at [place] 11/17/2013  CURRENT THERAPY:  1) Systemic chemotherapy with carboplatin for AUC of 5, Alimta 500 mg/M2 and Avastin 15 mg/KG every 3 weeks, status post 5 cycles. [facility] 2) Xgeva 120 mcg subcutaneously every 2 months.  INTERVAL HISTORY: Sherri BEAL89y.o. female returns to the clinic today for followup visit accompanied by her female partner. The patient was seen for initial evaluation at the multidisciplinary thoracic oncology clinic on 09/01/2013 after she was diagnosed with metastatic non-small cell lung cancer. [facility] She was seen by Dr. DOretha Capriceand was started on treatment with carboplatin, Alimta and Avastin status post 4 cycles. Repeat CT scan of the chest, abdomen and pelvis after cycle #3 on 11/22/2013 showed multiple bilateral pulmonary nodules and masses  compatible with metastatic disease and compared to the previous scan on 08/12/2013 many of these nodule had slightly decreased in size. There was also mediastinal and bilateral hilar lymphadenopathy slightly decreased compared to the prior scan. The left hilar and mediastinal lymph nodes remain similar in size to the prior study. There is a new fracture of the right ninth rib suspicious for a pathologic fracture. CT scan of the abdomen and pelvis on the same day showed interval increase in size of 2 out of the 3 hypodense lesions within the liver concerning for worsening metastatic disease. The lesion within the right hepatic lobe measured 1.7 x 1.6 CM compared to 1.2 x 1.1 CM on the previous scan. An additional right hepatic lesion measured 1.2 x 0.9 CM compared to 0.8 x 0.7 CM and a lesion within the left hepatic lobe measured 0.9 x 0.6 CM compared to 0.9 x 0.5 CM previously. There was no evidence for new hepatic lesion. Is unchanged lytic lesions within the L3 vertebral body and pelvis consistent with osseous metastatic disease. Dr. DOretha Capricerecommended for the patient to continue her current systemic chemotherapy with carboplatin, Alimta and Avastin. She received cycle #4 on 11/22/2013.  [facility]. She received cycle #5 on 12/16/2013 [place] She recently had a restaging CT scan of the chest, abdomen and pelvis and presents to discuss the results. She complains of persistent nausea with no relief from the Compazine. She previously had good results with Zyprexa and requests a refill for that medication. She continues to have issues with constipation. She is to suppository on Sunday and again on Tuesday and finally had a bowel movement yesterday.  She's had some episodes of diarrhea today. She currently takes a 2 Colace tablets and for Senokot tablets daily.  The patient continues to have shortness of breath with  exertion and dry cough. She has significant fatigue and weakness. Her by mouth intake of food and fluids has decreased. Her performance status has continued to decline recently.   MEDICAL HISTORY: Past Medical History  Diagnosis Date  . Arthritis   . Bilateral ovarian cysts   . GERD (gastroesophageal reflux disease)   . Strain of hip flexor 06/2013    left side torn  . Cancer     lung ca    ALLERGIES:  is allergic to ketoconazole; lorazepam; nystatin; sporanox; tylenol; vitamin d; and zofran.  MEDICATIONS:  Current Outpatient Prescriptions  Medication Sig Dispense Refill  . bisacodyl (DULCOLAX) 10 MG suppository Place 10 mg rectally daily as needed for moderate constipation.      . calcium carbonate (OS-CAL) 1250 MG chewable tablet Chew 4 tablets by mouth daily.      . codeine 30 MG tablet Take 1 tablet (30 mg total) by mouth every 4 (four) hours as needed (cough).  80 tablet  0  . dexamethasone (DECADRON) 4 MG tablet Take 4 mg by mouth 2 (two) times daily with a meal. Take 1 tablet bid the day before , the day of and the day after.      Marland Kitchen dexamethasone (DECADRON) 4 MG tablet Take 4 mg by mouth daily.      Marland Kitchen docusate sodium (COLACE) 100 MG capsule Take 100 mg by mouth 2 (two) times daily.      Marland Kitchen esomeprazole (NEXIUM) 40 MG capsule Take 20 mg by mouth daily as needed (for acid reflex).       Marland Kitchen estradiol-norethindrone (ACTIVELLA) 1-0.5 MG per tablet Take 1 tablet by mouth daily.      Marland Kitchen gabapentin (NEURONTIN) 300 MG capsule Take 800 mg by mouth daily.       Marland Kitchen glycerin adult (GLYCERIN ADULT) 2 G SUPP Place 1 suppository rectally once.  2 suppository  0  . naproxen sodium (ANAPROX) 220 MG tablet Take 220 mg by mouth daily as needed.      Marland Kitchen OLANZapine (ZYPREXA) 10 MG tablet Take 1 tablet (10 mg total) by mouth at bedtime.  30 tablet  0  . polyethylene glycol (MIRALAX) packet Take 17 g by mouth 2 (two) times daily.  14 each  0  . polyvinyl alcohol (LIQUIFILM TEARS) 1.4 % ophthalmic solution  Place 1 drop into both eyes at bedtime.      . prochlorperazine (COMPAZINE) 10 MG tablet Take 10 mg by mouth every 6 (six) hours as needed for nausea or vomiting.      . senna (SENOKOT) 8.6 MG tablet Take 4 tablets by mouth 2 (two) times daily.       . methylPREDNIsolone (MEDROL DOSPACK) 4 MG tablet follow package directions  21 tablet  0  . OLANZapine (ZYPREXA) 10 MG tablet Take 1 tablet (10 mg total) by mouth at bedtime.  30 tablet  0  . oxyCODONE (OXY IR/ROXICODONE) 5 MG immediate release tablet Take 5 mg by mouth 2 (two) times daily as needed for severe pain.      . OxyCODONE (OXYCONTIN) 10 mg T12A 12 hr tablet Take 10 mg by mouth 3 (three) times daily.       No current facility-administered medications for this visit.   Facility-Administered Medications Ordered in Other Visits  Medication Dose Route Frequency Provider Last  Rate Last Dose  . sodium chloride 0.9 % injection 10 mL  10 mL Intravenous PRN Curt Bears, MD   10 mL at 01/06/14 1517    SURGICAL HISTORY:  Past Surgical History  Procedure Laterality Date  . Dilation and curettage of uterus  2004    x 3   . Tubal ligation    . Colonoscopy  04/05/2004    normal     REVIEW OF SYSTEMS:  Constitutional: positive for anorexia, fatigue and weight loss Eyes: negative Ears, nose, mouth, throat, and face: negative Respiratory: positive for cough and dyspnea on exertion Cardiovascular: negative Gastrointestinal: positive for constipation and nausea Genitourinary:negative Integument/breast: negative Hematologic/lymphatic: negative Musculoskeletal:positive for muscle weakness Neurological: negative Behavioral/Psych: negative Endocrine: negative Allergic/Immunologic: negative   PHYSICAL EXAMINATION: General appearance: alert, cooperative, fatigued and no distress Head: Normocephalic, without obvious abnormality, atraumatic Neck: no adenopathy, no JVD, supple, symmetrical, trachea midline and thyroid not enlarged, symmetric,  no tenderness/mass/nodules Lymph nodes: Cervical, supraclavicular, and axillary nodes normal. Resp: clear to auscultation bilaterally Back: symmetric, no curvature. ROM normal. No CVA tenderness. Cardio: regular rate and rhythm, S1, S2 normal, no murmur, click, rub or gallop GI: soft, non-tender; bowel sounds normal; no masses,  no organomegaly Extremities: extremities normal, atraumatic, no cyanosis or edema Neurologic: Alert and oriented X 3, normal strength and tone. Normal symmetric reflexes. Normal coordination and gait  ECOG PERFORMANCE STATUS: 2 - Symptomatic, <50% confined to bed  Blood pressure 128/82, pulse 109, temperature 97.8 F (36.6 C), temperature source Oral, resp. rate 18, height _0  (1.6 m), weight 130 lb 1.6 oz (59.013 kg), last menstrual period 01/14/2008.  LABORATORY DATA: Lab Results  Component Value Date   WBC 19.3* 01/06/2014   HGB 9.8* 01/06/2014   HCT 29.6* 01/06/2014   MCV 88.5 01/06/2014   PLT 271 01/06/2014      Chemistry      Component Value Date/Time   NA 136 01/06/2014 1115   NA 136* 11/14/2013 1057   K 4.1 01/06/2014 1115   K 4.3 11/14/2013 1057   CL 96 11/14/2013 1057   CO2 23 01/06/2014 1115   CO2 25 11/14/2013 1057   BUN 23.8 01/06/2014 1115   BUN 14 11/14/2013 1057   CREATININE 0.9 01/06/2014 1115   CREATININE 0.65 11/14/2013 1057   CREATININE 0.83 09/07/2013 1506      Component Value Date/Time   CALCIUM 9.2 01/06/2014 1115   CALCIUM 9.4 11/14/2013 1057   ALKPHOS 79 01/06/2014 1115   ALKPHOS 66 11/14/2013 1057   AST 12 01/06/2014 1115   AST 12 11/14/2013 1057   ALT 12 01/06/2014 1115   ALT 15 11/14/2013 1057   BILITOT 0.86 01/06/2014 1115   BILITOT 1.4* 11/14/2013 1057       RADIOGRAPHIC STUDIES: Ct Chest W Contrast  12/31/2013   CLINICAL DATA:  History of lung cancer diagnosed in May 2015. Chemotherapy in progress. Radiation therapy complete. Shortness of breath. Right lower quadrant pain. Constipation.  EXAM: CT CHEST WITH CONTRAST  TECHNIQUE:  Multidetector CT imaging of the chest was performed during intravenous contrast administration.  CONTRAST:  137m OMNIPAQUE IOHEXOL 300 MG/ML  SOLN  COMPARISON:  CT of the chest, abdomen and pelvis 11/22/2013.  FINDINGS: CT CHEST FINDINGS  Mediastinum: Bulky right supraclavicular lymphadenopathy has significantly increased compared to the prior examination. Image 6 of series 2 demonstrates an enlarging conglomeration of lymph nodes measuring 5.6 x 2.4 cm in the right supraclavicular region. Inferior aspect of the right internal jugular vein appears  thrombosed, new compared to the prior examination. A right internal jugular double-lumen Port-A-Cath is present with tip terminating in the right atrium. The superior vena cava remains patent at this time. Compared to the prior examinations, there is significantly worsened mediastinal and left hilar lymphadenopathy. In the superior mediastinum markedly enlarged lymph nodes completely encase multiple vessels, including the innominate artery, best appreciated on image 13 of series 2. Markedly enlarged right paratracheal lymph node measuring 2.4 cm is significantly increased compared to the prior examination at which point it measured 17 mm. Subcarinal node is also significantly increased compared to the prior examination, measuring up to 3.9 x 2.4 cm, as is extensive bulky left hilar lymphadenopathy. Heart size is normal. There is no significant pericardial fluid, thickening or pericardial calcification. Esophagus is unremarkable in appearance.  Lungs/Pleura: Multiple large pulmonary nodules and masses are noted in the left lower lobe, the largest mass or conglomeration of nodules measures 3.6 x 4.3 cm (image 30 of series 5). Numerous other smaller pulmonary nodules are seen throughout the lungs bilaterally, compatible with widespread metastatic disease. These have increased in size compared to the prior examination, with a specific example of this a 2.2 x 1.4 cm nodule in  the medial aspect of the left upper lobe (image 16 of series 5), which previously measured 1.9 x 1.2 cm on prior study 11/22/2013. Some other nodules are stable in size. No definite new nodules are noted. Trace left pleural effusion layering dependently.  Musculoskeletal: There are no aggressive appearing lytic or blastic lesions noted in the visualized portions of the skeleton.  CT ABDOMEN AND PELVIS FINDINGS  Abdomen/Pelvis: Interval enlargement of 2 hypovascular liver lesions in the right lobe of the liver, largest of which measures 1.9 x 1.4 cm in segment 7 (image 46 of series 2), presumably enlarging hepatic metastases. No new hepatic lesions are noted. The appearance of the gallbladder, pancreas, bilateral adrenal glands and bilateral kidneys is unremarkable. The spleen appears borderline enlarged measuring up to 11.4 cm.  No significant volume of ascites. No pneumoperitoneum. No pathologic distention of small bowel. No lymphadenopathy noted in the abdomen or pelvis. Mild atherosclerosis throughout the abdominal and pelvic vasculature, without evidence of aneurysm. Uterus and ovaries are unremarkable in appearance. Urinary bladder is normal in appearance.  Musculoskeletal: Large lytic lesion measuring approximately 2.6 x 2.1 cm in S1 again noted.  IMPRESSION: 1. Today's study demonstrates progression of disease, predominantly manifest by marked enlargement of right supraclavicular, mediastinal and left hilar lymphadenopathy, as well as several left lower lobe pulmonary nodules. Several other pulmonary nodules in the lungs bilaterally have also enlarged, while many others appear stable. No definite new pulmonary nodules are noted. 2. In addition, there has been some interval enlargement of 2 metastatic lesions in segment 7 of the liver. 3. Large lytic lesion in S1 again noted, presumably a metastatic lesion. 4. Additional incidental findings, as above.   Electronically Signed   By: Vinnie Langton M.D.   On:  12/31/2013 16:53   Ct Abdomen Pelvis W Contrast  12/31/2013   CLINICAL DATA:  History of lung cancer diagnosed in May 2015. Chemotherapy in progress. Radiation therapy complete. Shortness of breath. Right lower quadrant pain. Constipation.  EXAM: CT CHEST WITH CONTRAST  TECHNIQUE: Multidetector CT imaging of the chest was performed during intravenous contrast administration.  CONTRAST:  126m OMNIPAQUE IOHEXOL 300 MG/ML  SOLN  COMPARISON:  CT of the chest, abdomen and pelvis 11/22/2013.  FINDINGS: CT CHEST FINDINGS  Mediastinum: Bulky right supraclavicular  lymphadenopathy has significantly increased compared to the prior examination. Image 6 of series 2 demonstrates an enlarging conglomeration of lymph nodes measuring 5.6 x 2.4 cm in the right supraclavicular region. Inferior aspect of the right internal jugular vein appears thrombosed, new compared to the prior examination. A right internal jugular double-lumen Port-A-Cath is present with tip terminating in the right atrium. The superior vena cava remains patent at this time. Compared to the prior examinations, there is significantly worsened mediastinal and left hilar lymphadenopathy. In the superior mediastinum markedly enlarged lymph nodes completely encase multiple vessels, including the innominate artery, best appreciated on image 13 of series 2. Markedly enlarged right paratracheal lymph node measuring 2.4 cm is significantly increased compared to the prior examination at which point it measured 17 mm. Subcarinal node is also significantly increased compared to the prior examination, measuring up to 3.9 x 2.4 cm, as is extensive bulky left hilar lymphadenopathy. Heart size is normal. There is no significant pericardial fluid, thickening or pericardial calcification. Esophagus is unremarkable in appearance.  Lungs/Pleura: Multiple large pulmonary nodules and masses are noted in the left lower lobe, the largest mass or conglomeration of nodules measures 3.6  x 4.3 cm (image 30 of series 5). Numerous other smaller pulmonary nodules are seen throughout the lungs bilaterally, compatible with widespread metastatic disease. These have increased in size compared to the prior examination, with a specific example of this a 2.2 x 1.4 cm nodule in the medial aspect of the left upper lobe (image 16 of series 5), which previously measured 1.9 x 1.2 cm on prior study 11/22/2013. Some other nodules are stable in size. No definite new nodules are noted. Trace left pleural effusion layering dependently.  Musculoskeletal: There are no aggressive appearing lytic or blastic lesions noted in the visualized portions of the skeleton.  CT ABDOMEN AND PELVIS FINDINGS  Abdomen/Pelvis: Interval enlargement of 2 hypovascular liver lesions in the right lobe of the liver, largest of which measures 1.9 x 1.4 cm in segment 7 (image 46 of series 2), presumably enlarging hepatic metastases. No new hepatic lesions are noted. The appearance of the gallbladder, pancreas, bilateral adrenal glands and bilateral kidneys is unremarkable. The spleen appears borderline enlarged measuring up to 11.4 cm.  No significant volume of ascites. No pneumoperitoneum. No pathologic distention of small bowel. No lymphadenopathy noted in the abdomen or pelvis. Mild atherosclerosis throughout the abdominal and pelvic vasculature, without evidence of aneurysm. Uterus and ovaries are unremarkable in appearance. Urinary bladder is normal in appearance.  Musculoskeletal: Large lytic lesion measuring approximately 2.6 x 2.1 cm in S1 again noted.  IMPRESSION: 1. Today's study demonstrates progression of disease, predominantly manifest by marked enlargement of right supraclavicular, mediastinal and left hilar lymphadenopathy, as well as several left lower lobe pulmonary nodules. Several other pulmonary nodules in the lungs bilaterally have also enlarged, while many others appear stable. No definite new pulmonary nodules are noted.  2. In addition, there has been some interval enlargement of 2 metastatic lesions in segment 7 of the liver. 3. Large lytic lesion in S1 again noted, presumably a metastatic lesion. 4. Additional incidental findings, as above.   Electronically Signed   By: Vinnie Langton M.D.   On: 12/31/2013 16:53   ASSESSMENT AND PLAN: This is a very pleasant 63 years old white female with:   1) stage IV non-small cell lung cancer, adenocarcinoma with metastatic disease to the bone, brain and liver. She is currently undergoing systemic chemotherapy with carboplatin, Alimta and Avastin is  status post 5 cycles. She had some evidence for mild disease progression after cycle #3 especially in the liver lesions but the patient was asked to continue her treatment with the same regimen by Dr. Oretha Caprice. Her recent CT scan reveals evidence for disease progression within the lungs and liver as well as extensive bone metastasis. Patient was discussed with and also seen by Dr. Julien Nordmann. These results were reviewed with the patient and her significant other. We will discontinue her current chemotherapy secondary to disease progression. Patient was given the option of second line chemotherapy with docetaxel plus/minus Cyramza versus initiation of immunotherapy with Nivolumab versus a referral to hospice/palliative care. She would like to proceed with immunotherapy with Nivolumab. Will check with managed care regarding her insurance coverage for Nivolumab. We will plan to start of cycle 1 of immunotherapy with Nivolumab on 01/13/2014.  Her previous molecular studies showed positive ERBB3 amplification. One of the options to treat this abnormality this treatment with single agent oral Afatinib.  2) metastatic bone disease: The patient will continue treatment with Xgeva subcutaneously every 2 months. She was referred  to Dr. Alvan Dame with [facility] orthopedic for evaluation and close monitoring of the left hip lesion since the patient has  transferred her care to [facility].  3) pain management: She will continue on tramadol 50 mg by mouth every 6 hours as needed for pain.  4) constipation and hemorrhoids: She will continue her current treatment with Dulcolax suppository in addition to MiraLAX on an as-needed basis.  5) nausea: Patient will be continued on Zyprexa 10 mg by mouth at bedtime total of 30 tablets was sent to her pharmacy of record via E. Scribe  6) appetite stimulation: Patient will be placed on a Medrol Dosepak to them stimulate her appetite and improve her by mouth intake of food and fluids. This prescription also sent her pharmacy of record via E. scribe.  All questions were answered. The patient knows to call the clinic with any problems, questions or concerns. We can certainly see the patient much sooner if necessary.  Carlton Adam, PA-C   Disclaimer: This note was dictated with voice recognition software. Similar sounding words can inadvertently be transcribed and may not be corrected upon review.

## 2014-01-06 NOTE — Telephone Encounter (Signed)
Per staff message and POF I have scheduled appts. Advised scheduler of appts. JMW  

## 2014-01-06 NOTE — Telephone Encounter (Signed)
gv and printed appt sched adn avs for pt for Sept and OCT...per desk nurse pt to have the fluids on sat not mon or tue

## 2014-01-07 ENCOUNTER — Ambulatory Visit: Payer: BC Managed Care – PPO

## 2014-01-07 ENCOUNTER — Telehealth: Payer: Self-pay | Admitting: Medical Oncology

## 2014-01-07 ENCOUNTER — Other Ambulatory Visit: Payer: Self-pay | Admitting: *Deleted

## 2014-01-07 NOTE — Telephone Encounter (Signed)
I returned her call about decadron and told her she only needs medrol dose pack. She is no longer on alimta.

## 2014-01-07 NOTE — Patient Instructions (Signed)
Your recent restaging CT scan revealed evidence of disease progression. Discontinue the dexamethasone the date for the day of and day after chemotherapy and see her current chemotherapy with carboplatin and Alimta has been discontinued. Return in one week to begin immunotherapy with Nivolumab Follow up in 3 weeks

## 2014-01-08 ENCOUNTER — Ambulatory Visit (HOSPITAL_BASED_OUTPATIENT_CLINIC_OR_DEPARTMENT_OTHER): Payer: BC Managed Care – PPO

## 2014-01-08 VITALS — BP 135/63 | HR 85 | Temp 98.1°F | Resp 18

## 2014-01-08 DIAGNOSIS — C7952 Secondary malignant neoplasm of bone marrow: Secondary | ICD-10-CM

## 2014-01-08 DIAGNOSIS — C3490 Malignant neoplasm of unspecified part of unspecified bronchus or lung: Secondary | ICD-10-CM

## 2014-01-08 DIAGNOSIS — R197 Diarrhea, unspecified: Secondary | ICD-10-CM

## 2014-01-08 DIAGNOSIS — C3430 Malignant neoplasm of lower lobe, unspecified bronchus or lung: Secondary | ICD-10-CM

## 2014-01-08 DIAGNOSIS — R5383 Other fatigue: Secondary | ICD-10-CM

## 2014-01-08 DIAGNOSIS — C7951 Secondary malignant neoplasm of bone: Secondary | ICD-10-CM

## 2014-01-08 DIAGNOSIS — R5381 Other malaise: Secondary | ICD-10-CM

## 2014-01-08 MED ORDER — SODIUM CHLORIDE 0.9 % IV SOLN
INTRAVENOUS | Status: AC
  Administered 2014-01-08: 09:00:00 via INTRAVENOUS

## 2014-01-08 NOTE — Patient Instructions (Signed)
Dehydration, Adult Dehydration is when you lose more fluids from the body than you take in. Vital organs like the kidneys, brain, and heart cannot function without a proper amount of fluids and salt. Any loss of fluids from the body can cause dehydration.  CAUSES   Vomiting.  Diarrhea.  Excessive sweating.  Excessive urine output.  Fever. SYMPTOMS  Mild dehydration  Thirst.  Dry lips.  Slightly dry mouth. Moderate dehydration  Very dry mouth.  Sunken eyes.  Skin does not bounce back quickly when lightly pinched and released.  Dark urine and decreased urine production.  Decreased tear production.  Headache. Severe dehydration  Very dry mouth.  Extreme thirst.  Rapid, weak pulse (more than 100 beats per minute at rest).  Cold hands and feet.  Not able to sweat in spite of heat and temperature.  Rapid breathing.  Blue lips.  Confusion and lethargy.  Difficulty being awakened.  Minimal urine production.  No tears. DIAGNOSIS  Your caregiver will diagnose dehydration based on your symptoms and your exam. Blood and urine tests will help confirm the diagnosis. The diagnostic evaluation should also identify the cause of dehydration. TREATMENT  Treatment of mild or moderate dehydration can often be done at home by increasing the amount of fluids that you drink. It is best to drink small amounts of fluid more often. Drinking too much at one time can make vomiting worse. Refer to the home care instructions below. Severe dehydration needs to be treated at the hospital where you will probably be given intravenous (IV) fluids that contain water and electrolytes. HOME CARE INSTRUCTIONS   Ask your caregiver about specific rehydration instructions.  Drink enough fluids to keep your urine clear or pale yellow.  Drink small amounts frequently if you have nausea and vomiting.  Eat as you normally do.  Avoid:  Foods or drinks high in sugar.  Carbonated  drinks.  Juice.  Extremely hot or cold fluids.  Drinks with caffeine.  Fatty, greasy foods.  Alcohol.  Tobacco.  Overeating.  Gelatin desserts.  Wash your hands well to avoid spreading bacteria and viruses.  Only take over-the-counter or prescription medicines for pain, discomfort, or fever as directed by your caregiver.  Ask your caregiver if you should continue all prescribed and over-the-counter medicines.  Keep all follow-up appointments with your caregiver. SEEK MEDICAL CARE IF:  You have abdominal pain and it increases or stays in one area (localizes).  You have a rash, stiff neck, or severe headache.  You are irritable, sleepy, or difficult to awaken.  You are weak, dizzy, or extremely thirsty. SEEK IMMEDIATE MEDICAL CARE IF:   You are unable to keep fluids down or you get worse despite treatment.  You have frequent episodes of vomiting or diarrhea.  You have blood or green matter (bile) in your vomit.  You have blood in your stool or your stool looks black and tarry.  You have not urinated in 6 to 8 hours, or you have only urinated a small amount of very dark urine.  You have a fever.  You faint. MAKE SURE YOU:   Understand these instructions.  Will watch your condition.  Will get help right away if you are not doing well or get worse. Document Released: 04/01/2005 Document Revised: 06/24/2011 Document Reviewed: 11/19/2010 ExitCare Patient Information 2015 [employer]. This information is not intended to replace advice given to you by your health care provider. Make sure you discuss any questions you have with your health care   provider.  

## 2014-01-10 ENCOUNTER — Telehealth: Payer: Self-pay | Admitting: Dietician

## 2014-01-10 NOTE — Telephone Encounter (Signed)
Brief Outpatient Oncology Nutrition Note  Patient has been identified to be at risk on malnutrition screen.  Wt Readings from Last 10 Encounters:  01/06/14 130 lb 1.6 oz (59.013 kg)  12/15/13 137 lb 6.4 oz (62.324 kg)  11/29/13 138 lb (62.596 kg)  11/26/13 138 lb (62.596 kg)  11/25/13 138 lb (62.596 kg)  09/01/13 158 lb 8 oz (71.895 kg)  08/25/13 161 lb (73.029 kg)  08/23/13 155 lb (70.308 kg)  08/10/13 160 lb 9.6 oz (72.848 kg)  07/27/13 159 lb (72.122 kg)      Dx:  Metastatic Lung Cancer  Called patient due to weight loss. Patient has recently transferred treatment from [facility] to her to be closer to home.  Problems with increased nausea and poor appetite noted per chart.  Patient was not available.  Contact information for the [place] [facility] provided.  Antonieta Iba, RD, LDN

## 2014-01-13 ENCOUNTER — Ambulatory Visit (HOSPITAL_BASED_OUTPATIENT_CLINIC_OR_DEPARTMENT_OTHER): Payer: BC Managed Care – PPO

## 2014-01-13 ENCOUNTER — Other Ambulatory Visit: Payer: Self-pay | Admitting: Medical Oncology

## 2014-01-13 ENCOUNTER — Other Ambulatory Visit (HOSPITAL_BASED_OUTPATIENT_CLINIC_OR_DEPARTMENT_OTHER): Payer: BC Managed Care – PPO

## 2014-01-13 ENCOUNTER — Ambulatory Visit: Payer: BC Managed Care – PPO

## 2014-01-13 VITALS — BP 125/59 | HR 106 | Temp 97.6°F | Resp 17

## 2014-01-13 DIAGNOSIS — C3432 Malignant neoplasm of lower lobe, left bronchus or lung: Secondary | ICD-10-CM

## 2014-01-13 DIAGNOSIS — Z79899 Other long term (current) drug therapy: Secondary | ICD-10-CM

## 2014-01-13 DIAGNOSIS — Z5112 Encounter for antineoplastic immunotherapy: Secondary | ICD-10-CM

## 2014-01-13 DIAGNOSIS — R110 Nausea: Secondary | ICD-10-CM

## 2014-01-13 DIAGNOSIS — Z95828 Presence of other vascular implants and grafts: Secondary | ICD-10-CM

## 2014-01-13 DIAGNOSIS — C3490 Malignant neoplasm of unspecified part of unspecified bronchus or lung: Secondary | ICD-10-CM

## 2014-01-13 LAB — CBC WITH DIFFERENTIAL/PLATELET
BASO%: 0.6 % (ref 0.0–2.0)
BASOS ABS: 0.1 10*3/uL (ref 0.0–0.1)
EOS%: 2.1 % (ref 0.0–7.0)
Eosinophils Absolute: 0.3 10*3/uL (ref 0.0–0.5)
HEMATOCRIT: 29.2 % — AB (ref 34.8–46.6)
HEMOGLOBIN: 9.6 g/dL — AB (ref 11.6–15.9)
LYMPH#: 1.2 10*3/uL (ref 0.9–3.3)
LYMPH%: 8.7 % — ABNORMAL LOW (ref 14.0–49.7)
MCH: 30.1 pg (ref 25.1–34.0)
MCHC: 32.9 g/dL (ref 31.5–36.0)
MCV: 91.7 fL (ref 79.5–101.0)
MONO#: 0.9 10*3/uL (ref 0.1–0.9)
MONO%: 6.6 % (ref 0.0–14.0)
NEUT#: 11.2 10*3/uL — ABNORMAL HIGH (ref 1.5–6.5)
NEUT%: 82 % — AB (ref 38.4–76.8)
Platelets: 282 10*3/uL (ref 145–400)
RBC: 3.19 10*6/uL — ABNORMAL LOW (ref 3.70–5.45)
RDW: 20.1 % — ABNORMAL HIGH (ref 11.2–14.5)
WBC: 13.6 10*3/uL — ABNORMAL HIGH (ref 3.9–10.3)

## 2014-01-13 LAB — COMPREHENSIVE METABOLIC PANEL (CC13)
ALT: 10 U/L (ref 0–55)
ANION GAP: 7 meq/L (ref 3–11)
AST: 11 U/L (ref 5–34)
Albumin: 3 g/dL — ABNORMAL LOW (ref 3.5–5.0)
Alkaline Phosphatase: 70 U/L (ref 40–150)
BUN: 19.6 mg/dL (ref 7.0–26.0)
CALCIUM: 9.5 mg/dL (ref 8.4–10.4)
CHLORIDE: 107 meq/L (ref 98–109)
CO2: 25 mEq/L (ref 22–29)
CREATININE: 0.8 mg/dL (ref 0.6–1.1)
GLUCOSE: 114 mg/dL (ref 70–140)
Potassium: 4.1 mEq/L (ref 3.5–5.1)
Sodium: 139 mEq/L (ref 136–145)
Total Bilirubin: 0.87 mg/dL (ref 0.20–1.20)
Total Protein: 6.7 g/dL (ref 6.4–8.3)

## 2014-01-13 LAB — TSH CHCC: TSH: 2.738 m(IU)/L (ref 0.308–3.960)

## 2014-01-13 MED ORDER — HEPARIN SOD (PORK) LOCK FLUSH 100 UNIT/ML IV SOLN
500.0000 [IU] | Freq: Once | INTRAVENOUS | Status: AC | PRN
  Administered 2014-01-13: 500 [IU]
  Filled 2014-01-13: qty 5

## 2014-01-13 MED ORDER — SODIUM CHLORIDE 0.9 % IJ SOLN
10.0000 mL | INTRAMUSCULAR | Status: DC | PRN
  Administered 2014-01-13: 10 mL via INTRAVENOUS
  Filled 2014-01-13: qty 10

## 2014-01-13 MED ORDER — NIVOLUMAB CHEMO INJECTION 100 MG/10ML
3.0000 mg/kg | Freq: Once | INTRAVENOUS | Status: AC
  Administered 2014-01-13: 180 mg via INTRAVENOUS
  Filled 2014-01-13: qty 18

## 2014-01-13 MED ORDER — PROCHLORPERAZINE MALEATE 10 MG PO TABS: 10.0000 mg | ORAL_TABLET | Freq: Four times a day (QID) | ORAL | Status: DC | PRN

## 2014-01-13 MED ORDER — SODIUM CHLORIDE 0.9 % IJ SOLN
10.0000 mL | INTRAMUSCULAR | Status: DC | PRN
  Administered 2014-01-13: 10 mL
  Filled 2014-01-13: qty 10

## 2014-01-13 MED ORDER — SODIUM CHLORIDE 0.9 % IV SOLN
Freq: Once | INTRAVENOUS | Status: AC
  Administered 2014-01-13: 11:00:00 via INTRAVENOUS

## 2014-01-13 MED ORDER — TRAMADOL HCL 50 MG PO TABS
50.0000 mg | ORAL_TABLET | Freq: Once | ORAL | Status: AC
Start: 2014-01-13 — End: 2014-01-13
  Administered 2014-01-13: 50 mg via ORAL

## 2014-01-13 MED ORDER — TRAMADOL HCL 50 MG PO TABS
ORAL_TABLET | ORAL | Status: AC
  Filled 2014-01-13: qty 1

## 2014-01-13 NOTE — Patient Instructions (Signed)

## 2014-01-13 NOTE — Progress Notes (Signed)
Patient completed 1st dose of Nivolumab without difficulty.

## 2014-01-13 NOTE — Progress Notes (Signed)
Patient requesting something for back pain, reports taking Tramadol at home. Per Dr. Julien Nordmann, okay to give Tramadol 50 mg po x 1 dose only today.

## 2014-01-13 NOTE — Patient Instructions (Addendum)
[facility] Discharge Instructions for Patients Receiving Chemotherapy  Today you received the following chemotherapy agents: Nivolumab  To help prevent nausea and vomiting after your treatment, we encourage you to take your nausea medication as prescribed by your physician.    If you develop nausea and vomiting that is not controlled by your nausea medication, call the clinic.   BELOW ARE SYMPTOMS THAT SHOULD BE REPORTED IMMEDIATELY:  *FEVER GREATER THAN 100.5 F  *CHILLS WITH OR WITHOUT FEVER  NAUSEA AND VOMITING THAT IS NOT CONTROLLED WITH YOUR NAUSEA MEDICATION  *UNUSUAL SHORTNESS OF BREATH  *UNUSUAL BRUISING OR BLEEDING  TENDERNESS IN MOUTH AND THROAT WITH OR WITHOUT PRESENCE OF ULCERS  *URINARY PROBLEMS  *BOWEL PROBLEMS  UNUSUAL RASH Items with * indicate a potential emergency and should be followed up as soon as possible.  Feel free to call the clinic you have any questions or concerns. The clinic phone number is [phone].  Nivolumab injection What is this medicine? NIVOLUMAB (nye VOL ue mab) is used to treat certain types of melanoma and lung cancer. This medicine may be used for other purposes; ask your health care provider or pharmacist if you have questions. COMMON BRAND NAME(S): Opdivo What should I tell my health care provider before I take this medicine? They need to know if you have any of these conditions: -eye disease, vision problems -history of pancreatitis -immune system problems -inflammatory bowel disease -kidney disease -liver disease -lung disease -lupus -myasthenia gravis -multiple sclerosis -organ transplant -stomach or intestine problems -thyroid disease -tingling of the fingers or toes, or other nerve disorder -an unusual or allergic reaction to nivolumab, other medicines, foods, dyes, or preservatives -pregnant or trying to get pregnant -breast-feeding How should I use this medicine? This medicine is for infusion  into a vein. It is given by a health care professional in a hospital or clinic setting. A special MedGuide will be given to you before each treatment. Be sure to read this information carefully each time. Talk to your pediatrician regarding the use of this medicine in children. Special care may be needed. Overdosage: If you think you've taken too much of this medicine contact a poison control center or emergency room at once. Overdosage: If you think you have taken too much of this medicine contact a poison control center or emergency room at once. NOTE: This medicine is only for you. Do not share this medicine with others. What if I miss a dose? It is important not to miss your dose. Call your doctor or health care professional if you are unable to keep an appointment. What may interact with this medicine? Interactions have not been studied. This list may not describe all possible interactions. Give your health care provider a list of all the medicines, herbs, non-prescription drugs, or dietary supplements you use. Also tell them if you smoke, drink alcohol, or use illegal drugs. Some items may interact with your medicine. What should I watch for while using this medicine? Tell your doctor or healthcare professional if your symptoms do not start to get better or if they get worse. Your condition will be monitored carefully while you are receiving this medicine. You may need blood work done while you are taking this medicine. What side effects may I notice from receiving this medicine? Side effects that you should report to your doctor or health care professional as soon as possible: -allergic reactions like skin rash, itching or hives, swelling of the face, lips, or tongue -black,  tarry stools -bloody or watery diarrhea -changes in vision -chills -cough -depressed mood -eye pain -feeling anxious -fever -general ill feeling or flu-like symptoms -hair loss -loss of appetite -low blood  counts - this medicine may decrease the number of white blood cells, red blood cells and platelets. You may be at increased risk for infections and bleeding -pain, tingling, numbness in the hands or feet -redness, blistering, peeling or loosening of the skin, including inside the mouth -red pinpoint spots on skin -signs of decreased platelets or bleeding - bruising, pinpoint red spots on the skin, black, tarry stools, blood in the urine -signs of decreased red blood cells - unusually weak or tired, feeling faint or lightheaded, falls -signs of infection - fever or chills, cough, sore throat, pain or trouble passing urine -signs and symptoms of a dangerous change in heartbeat or heart rhythm like chest pain; dizziness; fast or irregular heartbeat; palpitations; feeling faint or lightheaded, falls; breathing problems -signs and symptoms of high blood sugar such as dizziness; dry mouth; dry skin; fruity breath; nausea; stomach pain; increased hunger or thirst; increased urination -signs and symptoms of kidney injury like trouble passing urine or change in the amount of urine -signs and symptoms of liver injury like dark yellow or brown urine; general ill feeling or flu-like symptoms; light-colored stools; loss of appetite; nausea; right upper belly pain; unusually weak or tired; yellowing of the eyes or skin -signs and symptoms of increased potassium like muscle weakness; chest pain; or fast, irregular heartbeat -signs and symptoms of low potassium like muscle cramps or muscle pain; chest pain; dizziness; feeling faint or lightheaded, falls; palpitations; breathing problems; or fast, irregular heartbeat -swelling of the ankles, feet, hands -weight gainSide effects that usually do not require medical attention (report to your doctor or health care professional if they continue or are bothersome): -constipation -general ill feeling or flu-like symptoms -hair loss -loss of appetite -nausea,  vomiting This list may not describe all possible side effects. Call your doctor for medical advice about side effects. You may report side effects to FDA at 1-800-FDA-1088. Where should I keep my medicine? This drug is given in a hospital or clinic and will not be stored at home. NOTE: This sheet is a summary. It may not cover all possible information. If you have questions about this medicine, talk to your doctor, pharmacist, or health care provider.  2015, Elsevier/Gold Standard. (2013-06-21 13:18:19)

## 2014-01-14 ENCOUNTER — Encounter: Payer: Self-pay | Admitting: Gastroenterology

## 2014-01-14 ENCOUNTER — Encounter (HOSPITAL_COMMUNITY): Payer: Self-pay | Admitting: Emergency Medicine

## 2014-01-14 ENCOUNTER — Telehealth: Payer: Self-pay | Admitting: *Deleted

## 2014-01-14 ENCOUNTER — Other Ambulatory Visit: Payer: Self-pay

## 2014-01-14 ENCOUNTER — Emergency Department (HOSPITAL_COMMUNITY): Payer: BC Managed Care – PPO

## 2014-01-14 ENCOUNTER — Inpatient Hospital Stay (HOSPITAL_COMMUNITY)
Admission: EM | Admit: 2014-01-14 | Discharge: 2014-01-16 | DRG: 176 | Disposition: A | Discharge status: Home / Self Care | Payer: BC Managed Care – PPO | Attending: Internal Medicine | Admitting: Internal Medicine

## 2014-01-14 ENCOUNTER — Other Ambulatory Visit: Payer: Self-pay | Admitting: Medical Oncology

## 2014-01-14 ENCOUNTER — Ambulatory Visit (HOSPITAL_BASED_OUTPATIENT_CLINIC_OR_DEPARTMENT_OTHER): Payer: BC Managed Care – PPO | Admitting: Nurse Practitioner

## 2014-01-14 ENCOUNTER — Telehealth: Payer: Self-pay | Admitting: Medical Oncology

## 2014-01-14 ENCOUNTER — Other Ambulatory Visit: Payer: Self-pay | Admitting: *Deleted

## 2014-01-14 VITALS — BP 110/67 | HR 122 | Temp 98.0°F | Resp 18 | Ht 63.0 in | Wt 131.3 lb

## 2014-01-14 DIAGNOSIS — R54 Age-related physical debility: Secondary | ICD-10-CM | POA: Diagnosis present

## 2014-01-14 DIAGNOSIS — C3490 Malignant neoplasm of unspecified part of unspecified bronchus or lung: Secondary | ICD-10-CM | POA: Diagnosis present

## 2014-01-14 DIAGNOSIS — Z79899 Other long term (current) drug therapy: Secondary | ICD-10-CM

## 2014-01-14 DIAGNOSIS — J209 Acute bronchitis, unspecified: Secondary | ICD-10-CM | POA: Diagnosis present

## 2014-01-14 DIAGNOSIS — M1990 Unspecified osteoarthritis, unspecified site: Secondary | ICD-10-CM | POA: Diagnosis present

## 2014-01-14 DIAGNOSIS — Z923 Personal history of irradiation: Secondary | ICD-10-CM | POA: Diagnosis not present

## 2014-01-14 DIAGNOSIS — Z885 Allergy status to narcotic agent status: Secondary | ICD-10-CM | POA: Diagnosis not present

## 2014-01-14 DIAGNOSIS — C3432 Malignant neoplasm of lower lobe, left bronchus or lung: Secondary | ICD-10-CM | POA: Diagnosis present

## 2014-01-14 DIAGNOSIS — Z886 Allergy status to analgesic agent status: Secondary | ICD-10-CM

## 2014-01-14 DIAGNOSIS — Z888 Allergy status to other drugs, medicaments and biological substances status: Secondary | ICD-10-CM | POA: Diagnosis not present

## 2014-01-14 DIAGNOSIS — K219 Gastro-esophageal reflux disease without esophagitis: Secondary | ICD-10-CM | POA: Diagnosis present

## 2014-01-14 DIAGNOSIS — D649 Anemia, unspecified: Secondary | ICD-10-CM

## 2014-01-14 DIAGNOSIS — Z808 Family history of malignant neoplasm of other organs or systems: Secondary | ICD-10-CM

## 2014-01-14 DIAGNOSIS — C787 Secondary malignant neoplasm of liver and intrahepatic bile duct: Secondary | ICD-10-CM | POA: Diagnosis present

## 2014-01-14 DIAGNOSIS — I2699 Other pulmonary embolism without acute cor pulmonale: Secondary | ICD-10-CM | POA: Diagnosis not present

## 2014-01-14 DIAGNOSIS — S39013D Strain of muscle, fascia and tendon of pelvis, subsequent encounter: Secondary | ICD-10-CM | POA: Diagnosis not present

## 2014-01-14 DIAGNOSIS — C7951 Secondary malignant neoplasm of bone: Secondary | ICD-10-CM

## 2014-01-14 DIAGNOSIS — R945 Abnormal results of liver function studies: Secondary | ICD-10-CM

## 2014-01-14 DIAGNOSIS — R0789 Other chest pain: Secondary | ICD-10-CM

## 2014-01-14 DIAGNOSIS — R059 Cough, unspecified: Secondary | ICD-10-CM

## 2014-01-14 DIAGNOSIS — R079 Chest pain, unspecified: Secondary | ICD-10-CM | POA: Diagnosis present

## 2014-01-14 DIAGNOSIS — D869 Sarcoidosis, unspecified: Secondary | ICD-10-CM

## 2014-01-14 DIAGNOSIS — C7931 Secondary malignant neoplasm of brain: Secondary | ICD-10-CM | POA: Diagnosis present

## 2014-01-14 DIAGNOSIS — R05 Cough: Secondary | ICD-10-CM

## 2014-01-14 DIAGNOSIS — Z87891 Personal history of nicotine dependence: Secondary | ICD-10-CM

## 2014-01-14 DIAGNOSIS — R0600 Dyspnea, unspecified: Secondary | ICD-10-CM

## 2014-01-14 LAB — CBC WITH DIFFERENTIAL/PLATELET
BASOS ABS: 0 10*3/uL (ref 0.0–0.1)
Basophils Relative: 0 % (ref 0–1)
Eosinophils Absolute: 0.2 10*3/uL (ref 0.0–0.7)
Eosinophils Relative: 2 % (ref 0–5)
HEMATOCRIT: 28.3 % — AB (ref 36.0–46.0)
Hemoglobin: 9.3 g/dL — ABNORMAL LOW (ref 12.0–15.0)
LYMPHS ABS: 0.7 10*3/uL (ref 0.7–4.0)
LYMPHS PCT: 6 % — AB (ref 12–46)
MCH: 30.1 pg (ref 26.0–34.0)
MCHC: 32.9 g/dL (ref 30.0–36.0)
MCV: 91.6 fL (ref 78.0–100.0)
MONO ABS: 0.8 10*3/uL (ref 0.1–1.0)
Monocytes Relative: 7 % (ref 3–12)
Neutro Abs: 9.5 10*3/uL — ABNORMAL HIGH (ref 1.7–7.7)
Neutrophils Relative %: 85 % — ABNORMAL HIGH (ref 43–77)
Platelets: 232 10*3/uL (ref 150–400)
RBC: 3.09 MIL/uL — AB (ref 3.87–5.11)
RDW: 19.3 % — ABNORMAL HIGH (ref 11.5–15.5)
WBC: 11.1 10*3/uL — AB (ref 4.0–10.5)

## 2014-01-14 LAB — BASIC METABOLIC PANEL
ANION GAP: 14 (ref 5–15)
BUN: 18 mg/dL (ref 6–23)
CO2: 22 meq/L (ref 19–32)
Calcium: 9 mg/dL (ref 8.4–10.5)
Chloride: 99 mEq/L (ref 96–112)
Creatinine, Ser: 0.7 mg/dL (ref 0.50–1.10)
GFR calc Af Amer: >90 mL/min (ref >90)
GFR calc non Af Amer: >90 mL/min (ref >90)
GLUCOSE: 131 mg/dL — AB (ref 70–99)
Potassium: 3.9 mEq/L (ref 3.7–5.3)
Sodium: 135 mEq/L — ABNORMAL LOW (ref 137–147)

## 2014-01-14 LAB — APTT: aPTT: 36 seconds (ref 24–37)

## 2014-01-14 LAB — PROTIME-INR
INR: 1.25 (ref 0.00–1.49)
Prothrombin Time: 15.8 seconds — ABNORMAL HIGH (ref 11.6–15.2)

## 2014-01-14 LAB — TROPONIN I: Troponin I: <0.3 ng/mL (ref <0.30)

## 2014-01-14 LAB — MRSA PCR SCREENING: MRSA by PCR: NEGATIVE

## 2014-01-14 MED ORDER — LEVOFLOXACIN IN D5W 500 MG/100ML IV SOLN
500.0000 mg | INTRAVENOUS | Status: DC
  Administered 2014-01-14 – 2014-01-15 (×2): 500 mg via INTRAVENOUS
  Filled 2014-01-14 (×3): qty 100

## 2014-01-14 MED ORDER — ALBUTEROL SULFATE (2.5 MG/3ML) 0.083% IN NEBU
2.5000 mg | INHALATION_SOLUTION | Freq: Four times a day (QID) | RESPIRATORY_TRACT | Status: DC
  Filled 2014-01-14 (×2): qty 3

## 2014-01-14 MED ORDER — PROMETHAZINE HCL 25 MG PO TABS: 12.5000 mg | ORAL_TABLET | Freq: Four times a day (QID) | ORAL | Status: DC | PRN

## 2014-01-14 MED ORDER — GABAPENTIN 300 MG PO CAPS
600.0000 mg | ORAL_CAPSULE | Freq: Three times a day (TID) | ORAL | Status: DC
  Administered 2014-01-14 – 2014-01-16 (×3): 600 mg via ORAL
  Filled 2014-01-14 (×4): qty 2

## 2014-01-14 MED ORDER — SORBITOL 70 % SOLN
30.0000 mL | Freq: Every day | Status: DC | PRN
  Filled 2014-01-14: qty 30

## 2014-01-14 MED ORDER — IOHEXOL 300 MG/ML  SOLN
100.0000 mL | Freq: Once | INTRAMUSCULAR | Status: AC | PRN
  Administered 2014-01-14: 100 mL via INTRAVENOUS

## 2014-01-14 MED ORDER — SENNA 8.6 MG PO TABS
2.0000 | ORAL_TABLET | Freq: Two times a day (BID) | ORAL | Status: DC
  Administered 2014-01-14 – 2014-01-16 (×3): 17.2 mg via ORAL
  Filled 2014-01-14 (×3): qty 2

## 2014-01-14 MED ORDER — GLYCERIN (LAXATIVE) 2 G RE SUPP
1.0000 | Freq: Once | RECTAL | Status: AC | PRN
  Filled 2014-01-14: qty 1

## 2014-01-14 MED ORDER — SODIUM CHLORIDE 0.9 % IV BOLUS (SEPSIS)
500.0000 mL | Freq: Once | INTRAVENOUS | Status: AC
  Administered 2014-01-14: 500 mL via INTRAVENOUS

## 2014-01-14 MED ORDER — HEPARIN (PORCINE) IN NACL 100-0.45 UNIT/ML-% IJ SOLN
1000.0000 [IU]/h | INTRAMUSCULAR | Status: DC
Start: 2014-01-14 — End: 2014-01-15
  Administered 2014-01-14: 1000 [IU]/h via INTRAVENOUS
  Filled 2014-01-14 (×3): qty 250

## 2014-01-14 MED ORDER — SENNOSIDES 8.6 MG PO TABS: 2.0000 | ORAL_TABLET | Freq: Two times a day (BID) | ORAL | Status: DC

## 2014-01-14 MED ORDER — POLYVINYL ALCOHOL 1.4 % OP SOLN
1.0000 [drp] | OPHTHALMIC | Status: DC | PRN
  Filled 2014-01-14: qty 15

## 2014-01-14 MED ORDER — BISACODYL 10 MG RE SUPP: 10.0000 mg | Freq: Every day | RECTAL | Status: DC | PRN

## 2014-01-14 MED ORDER — DOCUSATE SODIUM 100 MG PO CAPS
100.0000 mg | ORAL_CAPSULE | Freq: Two times a day (BID) | ORAL | Status: DC
  Administered 2014-01-14 – 2014-01-16 (×3): 100 mg via ORAL
  Filled 2014-01-14 (×3): qty 1

## 2014-01-14 MED ORDER — POLYETHYLENE GLYCOL 3350 17 G PO PACK: 17.0000 g | PACK | Freq: Every day | ORAL | Status: DC | PRN

## 2014-01-14 MED ORDER — PROCHLORPERAZINE MALEATE 10 MG PO TABS
10.0000 mg | ORAL_TABLET | Freq: Four times a day (QID) | ORAL | Status: DC | PRN
  Filled 2014-01-14: qty 1

## 2014-01-14 MED ORDER — SODIUM CHLORIDE 0.9 % IV SOLN
INTRAVENOUS | Status: DC
  Administered 2014-01-14 – 2014-01-15 (×2): via INTRAVENOUS

## 2014-01-14 MED ORDER — GABAPENTIN 600 MG PO TABS
600.0000 mg | ORAL_TABLET | Freq: Three times a day (TID) | ORAL | Status: DC
  Filled 2014-01-14 (×2): qty 1

## 2014-01-14 MED ORDER — GUAIFENESIN ER 600 MG PO TB12
600.0000 mg | ORAL_TABLET | Freq: Two times a day (BID) | ORAL | Status: DC
  Filled 2014-01-14 (×2): qty 1

## 2014-01-14 MED ORDER — HEPARIN BOLUS VIA INFUSION
3000.0000 [IU] | INTRAVENOUS | Status: AC
  Administered 2014-01-14: 3000 [IU] via INTRAVENOUS
  Filled 2014-01-14: qty 3000

## 2014-01-14 MED ORDER — HEPARIN (PORCINE) IN NACL 100-0.45 UNIT/ML-% IJ SOLN
1000.0000 [IU]/h | INTRAMUSCULAR | Status: DC
  Filled 2014-01-14: qty 250

## 2014-01-14 MED ORDER — PANTOPRAZOLE SODIUM 40 MG PO TBEC
40.0000 mg | DELAYED_RELEASE_TABLET | Freq: Every day | ORAL | Status: DC
  Administered 2014-01-14 – 2014-01-16 (×3): 40 mg via ORAL
  Filled 2014-01-14 (×3): qty 1

## 2014-01-14 MED ORDER — CODEINE SULFATE 30 MG PO TABS: 30.0000 mg | ORAL_TABLET | ORAL | Status: DC | PRN

## 2014-01-14 MED ORDER — HEPARIN BOLUS VIA INFUSION
3000.0000 [IU] | INTRAVENOUS | Status: DC
Start: 2014-01-14 — End: 2014-01-14
  Filled 2014-01-14: qty 3000

## 2014-01-14 NOTE — H&P (Signed)
Patient Demographics  Sherri Stafford, is a 63 y.o. female  MRN: [id number]   DOB - May 10, 1950  Admit Date - 01/14/2014  Outpatient Primary MD for the patient is [name], MD   With History of -  Past Medical History  Diagnosis Date  . Arthritis   . Bilateral ovarian cysts   . GERD (gastroesophageal reflux disease)   . Strain of hip flexor 06/2013    left side torn  . Cancer     lung ca      Past Surgical History  Procedure Laterality Date  . Dilation and curettage of uterus  2004    x 3   . Tubal ligation    . Colonoscopy  04/05/2004    normal     in for   Chief Complaint  Patient presents with  . Chest Pain     HPI  Sherri Stafford  is a 63 y.o. female, with known history of metastatic stage IV lung adenocarcinoma,status post palliative radiotherapy to the left hip between 06/12 to 09/30/2013 at [facility]. status post stereotactic radiotherapy to brain lesions under the care of Dr. Katherine Roan at [facility] on 11/17/2013, as well most recent chemotherapy was yesterday by Dr. Earlie Server, patient presents with complaints of chest pain and shortness of breath, patient reports chest pain started yesterday evening midsternal pressure like quality worsened by deep breath, cough, and ambulation, not have any reproducible chest pain on exam, as well reports shortness of breath, patient first troponin was negative, given her cancer history she had CT chest angiogram which can significant for large PE in the right lower lobe pulmonary artery. Patient denies any hemoptysis, syncope, fever, chills, patella service requested to admit for further treatment and management.     Review of Systems    In addition to the HPI above,  No Fever-chills, No Headache, No changes with Vision or hearing, No problems swallowing food or Liquids, Complaints of Chest pain, Cough or Shortness of Breath, No Abdominal pain, No Nausea or Vommitting, Bowel  movements are regular, No Blood in stool or Urine, No dysuria, No new skin rashes or bruises, No new joints pains-aches,  No new weakness, tingling, numbness in any extremity, No recent weight gain or loss, No polyuria, polydypsia or polyphagia, No significant Mental Stressors.  A full 10 point Review of Systems was done, except as stated above, all other Review of Systems were negative.   Social History History  Substance Use Topics  . Smoking status: Former Smoker -- 1.00 packs/day for 14 years    Types: Cigarettes    Quit date: 04/15/1984  . Smokeless tobacco: Never Used  . Alcohol Use: No     Family History Family History  Problem Relation Age of Onset  . Brain cancer Father   . Emphysema Father     smoked  . Pancreatic cancer Mother   . Colon cancer Neg Hx   . Breast cancer Paternal Aunt 71     Prior to Admission medications   Medication Sig Start Date End Date Taking? Authorizing Provider  bisacodyl (DULCOLAX) 10 MG suppository Place 10 mg rectally daily as needed for moderate constipation.   Yes Historical Provider, MD  calcium carbonate (OS-CAL) 1250 MG chewable tablet Chew 4 tablets by mouth daily.   Yes Historical Provider, MD  codeine 30 MG tablet Take 1 tablet (30 mg total) by mouth every 4 (four) hours as needed (cough). 09/02/13  Yes Christena Deem  Wert, MD  docusate sodium (COLACE) 100 MG capsule Take 100 mg by mouth 2 (two) times daily.   Yes Historical Provider, MD  esomeprazole (NEXIUM) 40 MG capsule Take 20 mg by mouth daily as needed (for acid reflex).    Yes Historical Provider, MD  gabapentin (NEURONTIN) 600 MG tablet Take 600 mg by mouth 3 (three) times daily.   Yes Historical Provider, MD  meloxicam (MOBIC) 15 MG tablet Take 15 mg by mouth daily.   Yes Historical Provider, MD  polyethylene glycol (MIRALAX / GLYCOLAX) packet Take 17 g by mouth daily as needed for moderate constipation. 11/14/13  Yes Artis Delay, MD  polyvinyl alcohol (LIQUIFILM TEARS) 1.4  % ophthalmic solution Place 1 drop into both eyes as needed for dry eyes.    Yes Historical Provider, MD  prochlorperazine (COMPAZINE) 10 MG tablet Take 1 tablet (10 mg total) by mouth every 6 (six) hours as needed for nausea or vomiting. 01/13/14  Yes Curt Bears, MD  senna (SENOKOT) 8.6 MG tablet Take 2 tablets by mouth 2 (two) times daily.    Yes Historical Provider, MD  glycerin adult 2 G SUPP Place 1 suppository rectally once as needed for moderate constipation. 11/14/13   Artis Delay, MD  methylPREDNIsolone (MEDROL DOSPACK) 4 MG tablet follow package directions 01/06/14   Carlton Adam, PA-C    Allergies  Allergen Reactions  . Other     Opiates cause severe constipation. (to the point of being hospitalized)  . Ketoconazole Hives  . Lorazepam Hives    Broke out in hives at [facility]   . Morphine And Related Other (See Comments)    "Constipation to point of being hospitalized"  . Nystatin Hives  . Sporanox [Itraconazole] Hives  . Tylenol [Acetaminophen] Other (See Comments)    Rosanna Randy Syndrome   . Vitamin D Other (See Comments)    Gilbert's syndrome, elevated liver enzymes  . Zofran [Ondansetron Hcl] Hives    Hives at [facility]     Physical Exam  Vitals  Blood pressure 101/77, pulse 103, temperature 98.1 F (36.7 C), temperature source Oral, resp. rate 17, last menstrual period 01/14/2008, SpO2 100.00%.   1. General frail elderly female lying in bed in NAD,    2. Normal affect and insight, Not Suicidal or Homicidal, Awake Alert, Oriented X 3.  3. No F.N deficits, ALL C.Nerves Intact, Strength 5/5 all 4 extremities, Sensation intact all 4 extremities, Plantars down going.  4. Ears and Eyes appear Normal, Conjunctivae clear, PERRLA. Moist Oral Mucosa.  5. Supple Neck, No JVD, No cervical lymphadenopathy appriciated, No Carotid Bruits.  6. Symmetrical Chest wall movement, Good air movement bilaterally, scattered frail, no use of accessory muscle.  7. RRR, No Gallops, Rubs or  Murmurs, No Parasternal Heave.  8. Positive Bowel Sounds, Abdomen Soft, No tenderness, No organomegaly appriciated,No rebound -guarding or rigidity.  9.  No Cyanosis, Normal Skin Turgor, No Skin Rash or Bruise.  10. Good muscle tone,  joints appear normal , no effusions, Normal ROM.      Data Review  CBC  Recent Labs Lab 01/13/14 1011 01/14/14 1443  WBC 13.6* 11.1*  HGB 9.6* 9.3*  HCT 29.2* 28.3*  PLT 282 232  MCV 91.7 91.6  MCH 30.1 30.1  MCHC 32.9 32.9  RDW 20.1* 19.3*  LYMPHSABS 1.2 0.7  MONOABS 0.9 0.8  EOSABS 0.3 0.2  BASOSABS 0.1 0.0   ------------------------------------------------------------------------------------------------------------------  Chemistries   Recent Labs Lab 01/13/14 1011 01/14/14 1443  NA 139 135*  K 4.1 3.9  CL  --  99  CO2 25 22  GLUCOSE 114 131*  BUN 19.6 18  CREATININE 0.8 0.70  CALCIUM 9.5 9.0  AST 11  --   ALT 10  --   ALKPHOS 70  --   BILITOT 0.87  --    ------------------------------------------------------------------------------------------------------------------ CrCl is unknown because both a height and weight (above a minimum accepted value) are required for this calculation. ------------------------------------------------------------------------------------------------------------------  Recent Labs  01/13/14 1011  TSH 2.738     Coagulation profile No results found for this basename: INR, PROTIME,  in the last 168 hours ------------------------------------------------------------------------------------------------------------------- No results found for this basename: DDIMER,  in the last 72 hours -------------------------------------------------------------------------------------------------------------------  Cardiac Enzymes  Recent Labs Lab 01/14/14 1443  TROPONINI <0.30   ------------------------------------------------------------------------------------------------------------------ No  components found with this basename: POCBNP,    ---------------------------------------------------------------------------------------------------------------  Urinalysis    Component Value Date/Time   BILIRUBINUR neg 07/12/2013 0948   PROTEINUR Negative 12/16/2013 1235   PROTEINUR neg 07/12/2013 0948   UROBILINOGEN negative 07/12/2013 0948   NITRITE neg 07/12/2013 0948   LEUKOCYTESUR Negative 07/12/2013 0948    ----------------------------------------------------------------------------------------------------------------  Imaging results:   Dg Chest 2 View  01/14/2014   CLINICAL DATA:  63 year old female with chest pain. Status post initiation of chemotherapy.  EXAM: CHEST - 2 VIEW  COMPARISON:  Multiple prior. Most recent CT chest 11/22/2013. Most recent x-ray 11/14/2013.  FINDINGS: Cardiomediastinal silhouette within normal limits in size and contour.  Similar size number and distribution of bilateral pulmonary nodules/masses. Again dominant mass within the left lower lobe.  No confluent airspace disease. No pleural effusion. No pneumothorax.  A interval placement of double-lumen right-sided port catheter in the right internal jugular vein with the tip appearing to terminate in the right atrium.  Re- demonstration of fracture of the right ninth rib.  IMPRESSION: No radiographic evidence of acute cardiopulmonary disease.  Similar size, number, and distribution of multiple bilateral pulmonary nodules/ masses as compared to prior plain film and cross-sectional imaging.  Re- demonstration of right ninth rib fracture without significant callus formation.  Interval placement of double-lumen port catheter on the right chest wall, with the tip appearing to terminate in the right atrium.  Signed,  Dulcy Fanny. Earleen Newport, DO  [facility]  [facility]   Electronically Signed   By: Corrie Mckusick D.O.   On: 01/14/2014 15:22   Ct Angio Chest Pe W/cm &/or Wo  Cm  01/14/2014   CLINICAL DATA:  Chest pain; history of lung malignancy currently undergoing chemotherapy ; history of sarcoidosis and remote history of tobacco use ; history of right ninth rib fracture  EXAM: CT ANGIOGRAPHY CHEST WITH CONTRAST  TECHNIQUE: Multidetector CT imaging of the chest was performed using the standard protocol during bolus administration of intravenous contrast. Multiplanar CT image reconstructions and MIPs were obtained to evaluate the vascular anatomy.  CONTRAST:  125mL OMNIPAQUE IOHEXOL 300 MG/ML  SOLN intravenously  COMPARISON:  PA and lateral chest x-ray of today's date  FINDINGS: There is bulky diffuse mediastinal lymphadenopathy. There are enlarged hilar lymph nodes greater on the left than on the right. There are large filling defects within branches of the right lower lobe pulmonary artery. Definite filling defects elsewhere in the pulmonary artery tree are not demonstrated. The RV -LV ratio is 1.35. The heart overall is not enlarged. There is no pericardial effusion. There is a right internal jugular power port catheter in place. The thoracic aorta is unremarkable. The thyroid  gland is unremarkable. The thoracic esophagus exhibits no acute abnormalities.  There are multiple bilateral soft tissue density parenchymal masses of varying sizes. The largest measures 3.3 cm in diameter. There is no alveolar pneumonia. There is no evidence of pulmonary infarction.  Within the upper abdomen there are abnormal hypodensities posteriorly in the right hepatic lobe. The partially imaged adrenal glands are normal. The spleen exhibits no acute abnormality.  Review of the MIP images confirms the above findings.  IMPRESSION: 1. There are large central filling defects in branches of the right lower lobe pulmonary artery consistent with acute pulmonary embolism. There is evidence of right ventricular strain. Positive for acute PE with CT evidence of right heartstrain (RV/LV Ratio = 1.35) consistent  with at least submassive (intermediate risk)PE. The presence of right heart strain has been associated with anincreased risk of morbidity and mortality. Consultation with [facility] is recommended. 2. There is bulky mediastinal and left hilar lymphadenopathy. There are numerous soft tissue density masses throughout both lungs consistent with intrapulmonary metastatic disease. 3. There is are hypodense masses in the liver worrisome for metastatic disease. 4. Critical Value/emergent results were called by telephone at the time of interpretation on 01/14/2014 at 4:35 pm to Dr. Colin Rhein, who verbally acknowledged these results.   Electronically Signed   By: David  Martinique   On: 01/14/2014 16:43        Assessment & Plan  Principal Problem:   Pulmonary embolism Active Problems:   Adenocarcinoma of lung   Chest pain   Acute bronchitis      Pulmonary embolism: Patient is high risk for pulmonary embolism given her cancer status, CT chest and UA showing large right lower lobe pulmonary artery PE with evidence of right ventricular strain, anticoagulation was discussed with pulmonary on call Dr. Lake Bells, oncology on call Dr. Alen Blew, given her name history of brain metastasis, and they both recommend anticoagulation given her significant PE, and the patient had stereotactic brain lesion treatment, will start with IV heparin drip for anticoagulation at this point, and if she remains stable and 24 hour, she be transitioned to subcutaneous Lovenox when make. Take every 12 hours, on discharge, the patient is not a candidate for intracavitary pulmonary artery TPA giving her brain metastasis.  Chest pain: This is most likely related to her PE, but will continue to cycle her cardiac enzymes and follow the trend, she does not have any significant EKGs abnormalities.  Acute bronchitis: Patient will be started on levofloxacin.  Adenocarcinoma of the lung: Patient is followed by Dr.  Earlie Server, if there is any need for this consultation we'll contact him, meanwhile he was added to her treatment .  DVT Prophylaxis Heparin GTT  AM Labs Ordered, also please review Full Orders  Family Communication: Admission, patients condition and plan of care including tests being ordered have been discussed with the patient and husband who indicate understanding and agree with the plan and Code Status.  Code Status patient has a living well, husband is her health care power of attorney, patient is a full code.  Likely DC to  home in stable  Condition GUARDED  /critical  Time spent in minutes : 60 minutes    Denyla Cortese M.D on 01/14/2014 at 5:15 PM  Between 7am to 7pm - Pager - [phone]  After 7pm go to [email or web address] - password [email or web address]  And look for the night coverage person covering me after hours  [facility] Office  [phone]   **Disclaimer: This  note may have been dictated with voice recognition software. Similar sounding words can inadvertently be transcribed and this note may contain transcription errors which may not have been corrected upon publication of note.**

## 2014-01-14 NOTE — ED Provider Notes (Signed)
CSN: [id number]     Arrival date & time 01/14/14  1416 History   First MD Initiated Contact with Patient 01/14/14 1419     Chief Complaint  Patient presents with  . 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 Patient has had mild exertional shortness of breath since lung cancer diagnosis were to feel this is overall similar or improved. Blood clot history, recent surgeries, unilateral leg swelling or leg pain. Patient has had a mild productive cough and symptoms bilateral nonradiating. Patient to start a new chemotherapy treatment. Patient sent over by [employer] staff for possible EKG changes. No heart attack or blood clot history. No high blood pressure high cholesterol history  Patient is a 63 y.o. female presenting with chest pain. The history is provided by the patient.  Chest Pain Associated symptoms: cough, nausea and shortness of breath   Associated symptoms: no abdominal pain, no back pain, no fever, no headache and not vomiting     Past Medical History  Diagnosis Date  . Arthritis   . Bilateral ovarian cysts   . GERD (gastroesophageal reflux disease)   . Strain of hip flexor 06/2013    left side torn  . Cancer     lung ca   Past Surgical History  Procedure Laterality Date  . Dilation and curettage of uterus  2004    x 3   . Tubal ligation    . Colonoscopy  04/05/2004    normal    Family History  Problem Relation Age of Onset  . Brain cancer Father   . Emphysema Father     smoked  . Pancreatic cancer Mother   . Colon cancer Neg Hx   . Breast cancer Paternal Aunt 53   History  Substance Use Topics  . Smoking status: Former Smoker -- 1.00 packs/day for 14 years     Types: Cigarettes    Quit date: 04/15/1984  . Smokeless tobacco: Never Used  . Alcohol Use: No   OB History   Grav Para Term Preterm Abortions TAB SAB Ect Mult Living   0              Review of Systems  Constitutional: Negative for fever and chills.  HENT: Negative for congestion.   Eyes: Negative for visual disturbance.  Respiratory: Positive for cough and shortness of breath.   Cardiovascular: Positive for chest pain.  Gastrointestinal: Positive for nausea. Negative for vomiting and abdominal pain.  Genitourinary: Negative for dysuria and flank pain.  Musculoskeletal: Negative for back pain, neck pain and neck stiffness.  Skin: Negative for rash.  Neurological: Negative for light-headedness and headaches.      Allergies  Ketoconazole; Lorazepam; Morphine and related; Nystatin; Sporanox; Tylenol; Vitamin d; and Zofran  Home Medications   Prior to Admission medications   Medication Sig Start Date End Date Taking? Authorizing Provider  bisacodyl (DULCOLAX) 10 MG suppository Place 10 mg rectally daily as needed for moderate constipation.    Historical Provider, MD  calcium carbonate (OS-CAL) 1250 MG chewable tablet Chew 4 tablets by mouth daily.    Historical Provider, MD  codeine 30 MG tablet Take 1 tablet (30 mg total) by mouth every 4 (four) hours as needed (cough). 09/02/13   Tanda Rockers, MD  docusate  sodium (COLACE) 100 MG capsule Take 100 mg by mouth 2 (two) times daily.    Historical Provider, MD  esomeprazole (NEXIUM) 40 MG capsule Take 20 mg by mouth daily as needed (for acid reflex).     Historical Provider, MD  estradiol-norethindrone (ACTIVELLA) 1-0.5 MG per tablet Take 1 tablet by mouth daily.    Historical Provider, MD  gabapentin (NEURONTIN) 300 MG capsule Take 800 mg by mouth daily.     Historical Provider, MD  glycerin adult (GLYCERIN ADULT) 2 G SUPP Place 1 suppository rectally once. 11/14/13   Artis Delay, MD  methylPREDNIsolone (MEDROL DOSPACK) 4 MG  tablet follow package directions 01/06/14   Carlton Adam, PA-C  naproxen sodium (ANAPROX) 220 MG tablet Take 220 mg by mouth daily as needed.    Historical Provider, MD  OLANZapine (ZYPREXA) 10 MG tablet Take 1 tablet (10 mg total) by mouth at bedtime. 12/15/13   Curt Bears, MD  OLANZapine (ZYPREXA) 10 MG tablet Take 1 tablet (10 mg total) by mouth at bedtime. 01/06/14   Carlton Adam, PA-C  oxyCODONE (OXY IR/ROXICODONE) 5 MG immediate release tablet Take 5 mg by mouth 2 (two) times daily as needed for severe pain.    Historical Provider, MD  OxyCODONE (OXYCONTIN) 10 mg T12A 12 hr tablet Take 10 mg by mouth 3 (three) times daily.    Historical Provider, MD  polyethylene glycol (MIRALAX) packet Take 17 g by mouth 2 (two) times daily. 11/14/13   Artis Delay, MD  polyvinyl alcohol (LIQUIFILM TEARS) 1.4 % ophthalmic solution Place 1 drop into both eyes at bedtime.    Historical Provider, MD  prochlorperazine (COMPAZINE) 10 MG tablet Take 1 tablet (10 mg total) by mouth every 6 (six) hours as needed for nausea or vomiting. 01/13/14   Curt Bears, MD  senna (SENOKOT) 8.6 MG tablet Take 4 tablets by mouth 2 (two) times daily.     Historical Provider, MD   BP 127/72  Pulse 109  Temp(Src) 98.1 F (36.7 C) (Oral)  Resp 15  SpO2 100%  LMP 01/14/2008 Physical Exam  Nursing note and vitals reviewed. Constitutional: She is oriented to person, place, and time. She appears well-developed and well-nourished.  HENT:  Head: Normocephalic and atraumatic.  Mild dry mucous membranes  Eyes: Conjunctivae are normal. Right eye exhibits no discharge. Left eye exhibits no discharge.  Neck: Normal range of motion. Neck supple. No tracheal deviation present.  Cardiovascular: Regular rhythm and intact distal pulses.  Tachycardia present.   Pulmonary/Chest: Effort normal and breath sounds normal.  Abdominal: Soft. She exhibits no distension. There is no tenderness. There is no guarding.  Musculoskeletal: She  exhibits no edema and no tenderness.  Neurological: She is alert and oriented to person, place, and time.  Skin: Skin is warm. No rash noted.  Psychiatric: She has a normal mood and affect.    ED Course  Procedures (including critical care time) Labs Review Labs Reviewed  CBC WITH DIFFERENTIAL - Abnormal; Notable for the following:    WBC 11.1 (*)    RBC 3.09 (*)    Hemoglobin 9.3 (*)    HCT 28.3 (*)    RDW 19.3 (*)    Neutrophils Relative % 85 (*)    Neutro Abs 9.5 (*)    Lymphocytes Relative 6 (*)    All other components within normal limits  BASIC METABOLIC PANEL - Abnormal; Notable for the following:    Sodium 135 (*)    Glucose, Bld 131 (*)  All other components within normal limits  TROPONIN I  TROPONIN I    Imaging Review Dg Chest 2 View  01/14/2014   CLINICAL DATA:  63 year old female with chest pain. Status post initiation of chemotherapy.  EXAM: CHEST - 2 VIEW  COMPARISON:  Multiple prior. Most recent CT chest 11/22/2013. Most recent x-ray 11/14/2013.  FINDINGS: Cardiomediastinal silhouette within normal limits in size and contour.  Similar size number and distribution of bilateral pulmonary nodules/masses. Again dominant mass within the left lower lobe.  No confluent airspace disease. No pleural effusion. No pneumothorax.  A interval placement of double-lumen right-sided port catheter in the right internal jugular vein with the tip appearing to terminate in the right atrium.  Re- demonstration of fracture of the right ninth rib.  IMPRESSION: No radiographic evidence of acute cardiopulmonary disease.  Similar size, number, and distribution of multiple bilateral pulmonary nodules/ masses as compared to prior plain film and cross-sectional imaging.  Re- demonstration of right ninth rib fracture without significant callus formation.  Interval placement of double-lumen port catheter on the right chest wall, with the tip appearing to terminate in the right atrium.  Signed,  Dulcy Fanny. Earleen Newport, DO  [facility]  [facility]   Electronically Signed   By: Corrie Mckusick D.O.   On: 01/14/2014 15:22     EKG Interpretation None     EKG reviewed, normal axis, sinus tachycardia, no acute ST elevation, incomplete right bundle branch block. MDM   Final diagnoses:  None  Chest pain Dyspnea Lung CA Anemia  Patient presents with chest tightness and shortness of breath discussed differential with patient including pneumonia, cardiac, blood clot, other. Plan for basic blood work, chest x-ray look for signs of pneumonia and likely CT angina chest to look for blood clot.  Chest x-ray reviewed lung cancer seen with no obvious infiltrate. CT angina the chest performed to look for blood clot or a cold pneumonia. Patient's care signed out to ED physician to followup second troponin, CT angina results and likely plan for telemetry observation for chest pain. The patients results and plan were reviewed and discussed.   Any x-rays performed were personally reviewed by myself.   Differential diagnosis were considered with the presenting HPI.  Medications  sodium chloride 0.9 % bolus 500 mL (500 mLs Intravenous Bolus from Bag 01/14/14 1535)    Filed Vitals:   01/14/14 1445 01/14/14 1530 01/14/14 1545 01/14/14 1600  BP: 127/72 128/78 115/81 101/77  Pulse: 109 109 113 112  Temp:      TempSrc:      Resp:  0 28 15  SpO2: 100% 99% 100% 99%    Final diagnoses:  None     Filed Vitals:   01/14/14 1445 01/14/14 1530 01/14/14 1545 01/14/14 1600  BP: 127/72 128/78 115/81 101/77  Pulse: 109 109 113 112  Temp:      TempSrc:      Resp:  0 28 15  SpO2: 100% 99% 100% 99%       Mariea Clonts, MD 01/14/14 1603

## 2014-01-14 NOTE — Progress Notes (Addendum)
ANTICOAGULATION CONSULT NOTE - Initial Consult  Pharmacy Consult for Heparin Indication: pulmonary embolus  Allergies  Allergen Reactions  . Other     Opiates cause severe constipation. (to the point of being hospitalized)  . Ketoconazole Hives  . Lorazepam Hives    Broke out in hives at [facility]   . Morphine And Related Other (See Comments)    "Constipation to point of being hospitalized"  . Nystatin Hives  . Sporanox [Itraconazole] Hives  . Tylenol [Acetaminophen] Other (See Comments)    Rosanna Randy Syndrome   . Vitamin D Other (See Comments)    Gilbert's syndrome, elevated liver enzymes  . Zofran [Ondansetron Hcl] Hives    Hives at [facility]     Patient Measurements:   Heparin Dosing Weight: 59.6 kg (actual body weight)  Vital Signs: Temp: 98.1 F (36.7 C) (10/02 1427) Temp Source: Oral (10/02 1427) BP: 101/77 mmHg (10/02 1600) Pulse Rate: 104 (10/02 1623)  Labs:  Recent Labs  01/13/14 1011 01/14/14 1443  HGB 9.6* 9.3*  HCT 29.2* 28.3*  PLT 282 232  CREATININE 0.8 0.70  TROPONINI  --  <0.30    The CrCl is unknown because both a height and weight (above a minimum accepted value) are required for this calculation.   Medical History: Past Medical History  Diagnosis Date  . Arthritis   . Bilateral ovarian cysts   . GERD (gastroesophageal reflux disease)   . Strain of hip flexor 06/2013    left side torn  . Cancer     lung ca      Assessment: 30 yoF with history of lung cancer presents with chest pain and dyspnea.  CT scan resulted with signs of PE and right heart strain.  Pharmacy consulted to manage IV heparin.  CBC: Hgb 9.3 (appears near baseline from last month), platelets 232 Renal: SCr 0.70, CrCl~79 ml/min  Goal of Therapy:  Heparin level 0.3-0.7 units/ml Monitor platelets by anticoagulation protocol: Yes   Plan:  1.  Baseline PTT, PT/INR ordered STAT. 2.  Heparin 3000 unit IV bolus then start infusion at 1000 units/hr. 3.  Check heparin level 6  hours after start of infusion. 4.  Daily heparin level and CBC while on heparin.  Hershal Coria 01/14/2014,4:45 PM   ADDENDUM: 01/14/2014 5:02 PM Protocol for heparin per pharmacy has been discontinued by MD.  Confirmed with EDP, Dr Colin Rhein, that orders for heparin should be discontinued.  Patient will be admitted and [facility] will take over care.  Pharmacy has discontinued above orders for heparin.  Will f/u plan for treatment of PE.  Hershal Coria, PharmD, BCPS Pager: [phone] 01/14/2014 5:03 PM   ADDENDUM: 01/14/2014 5:13 PM Admitting MD called pharmacy and ordered for pharmacy to start heparin drip STAT.   Plan:  Heparin 3000 unit IV bolus then start infusion at 1000 units/hr as above.  Hershal Coria, PharmD, BCPS Pager: [phone] 01/14/2014 5:14 PM

## 2014-01-14 NOTE — ED Notes (Signed)
Bed: RESA Expected date:  Expected time:  Means of arrival:  Comments: Cancer center-chest pressure

## 2014-01-14 NOTE — ED Notes (Signed)
Patient back from radiology Patient in NAD

## 2014-01-14 NOTE — ED Notes (Signed)
Patient from [facility] Patient states that she has been experiencing central chest pain which she describes has a "heaviness and tightness"--non-radiating Patient also reports SOB with activity which started yesterday as well Patient started new chemo tx and believed that symptoms were related to this Per Cancer center staff, an EKG was obtained and they believed an abnormality was present, so patient was brought here to ED Patient arrives alert and oriented x 4 from Cancer center

## 2014-01-14 NOTE — Telephone Encounter (Signed)
Reports "chest heaviness and increased coughing" since yesterday starting 3  hours after immunotherapy with Nivolumab .She denies chest pain , n/v any other symptoms . Appt made to see Selena Lesser today. Chronic low back pain - asked if she can take Mobic for her back pain . She has tramadol and it helped but she is afraid of constipation effect. I told her it is okay per [facility] to take Mobic and that tramadol not as bad for constipation like opiates. She said the opiates constipate her to the point she ended up in hospital and she is afraid to get constipated again.I instructed her to stay on her bowel regimen meds while on pain med.

## 2014-01-14 NOTE — ED Notes (Signed)
Patient continues to report 4/10 chest pain Will make EDP aware

## 2014-01-14 NOTE — ED Notes (Signed)
Patient transported to CT 

## 2014-01-14 NOTE — Telephone Encounter (Signed)
Pt had problems related to first chemo yesterday 01/13/14.  Pt to see Retta Mac, NP for symptom management today 01/14/14.

## 2014-01-14 NOTE — ED Provider Notes (Signed)
Assumed care of pt from Dr. Reather Converse.  Briefly, pt is a 63 y.o. female with a ho cancer presenting with chest pain which is new from her baseline ho dyspnea 2/2 pulm ca.  On assumption of care awaiting CTA of chest to ro PE and 2nd trop.    4:44 PM CT scan returned with signs of pulmonary embolism and right heart strain. Patient informed and hospitalist present. Patient started on heparin. Admitted in stable condition.  Debby Freiberg, MD 01/14/14 [phone]

## 2014-01-15 DIAGNOSIS — I369 Nonrheumatic tricuspid valve disorder, unspecified: Secondary | ICD-10-CM

## 2014-01-15 DIAGNOSIS — R05 Cough: Secondary | ICD-10-CM

## 2014-01-15 LAB — CBC
HCT: 25.8 % — ABNORMAL LOW (ref 36.0–46.0)
Hemoglobin: 8.4 g/dL — ABNORMAL LOW (ref 12.0–15.0)
MCH: 29.8 pg (ref 26.0–34.0)
MCHC: 32.6 g/dL (ref 30.0–36.0)
MCV: 91.5 fL (ref 78.0–100.0)
PLATELETS: 193 10*3/uL (ref 150–400)
RBC: 2.82 MIL/uL — ABNORMAL LOW (ref 3.87–5.11)
RDW: 19.4 % — AB (ref 11.5–15.5)
WBC: 9.9 10*3/uL (ref 4.0–10.5)

## 2014-01-15 LAB — BASIC METABOLIC PANEL
ANION GAP: 12 (ref 5–15)
BUN: 13 mg/dL (ref 6–23)
CALCIUM: 8.6 mg/dL (ref 8.4–10.5)
CO2: 24 mEq/L (ref 19–32)
CREATININE: 0.82 mg/dL (ref 0.50–1.10)
Chloride: 102 mEq/L (ref 96–112)
GFR calc non Af Amer: 76 mL/min — ABNORMAL LOW (ref >90)
GFR, EST AFRICAN AMERICAN: 88 mL/min — AB (ref >90)
Glucose, Bld: 118 mg/dL — ABNORMAL HIGH (ref 70–99)
Potassium: 3.9 mEq/L (ref 3.7–5.3)
Sodium: 138 mEq/L (ref 137–147)

## 2014-01-15 LAB — HEPARIN LEVEL (UNFRACTIONATED)
Heparin Unfractionated: 0.41 IU/mL (ref 0.30–0.70)
Heparin Unfractionated: 0.55 IU/mL (ref 0.30–0.70)

## 2014-01-15 LAB — TROPONIN I
Troponin I: <0.3 ng/mL (ref <0.30)
Troponin I: <0.3 ng/mL (ref <0.30)

## 2014-01-15 MED ORDER — ENOXAPARIN SODIUM 60 MG/0.6ML ~~LOC~~ SOLN
60.0000 mg | Freq: Two times a day (BID) | SUBCUTANEOUS | Status: AC
  Administered 2014-01-15 – 2014-01-16 (×2): 60 mg via SUBCUTANEOUS
  Filled 2014-01-15 (×2): qty 0.6

## 2014-01-15 MED ORDER — ALBUTEROL SULFATE (2.5 MG/3ML) 0.083% IN NEBU: 2.5000 mg | INHALATION_SOLUTION | Freq: Four times a day (QID) | RESPIRATORY_TRACT | Status: DC | PRN

## 2014-01-15 MED ORDER — ENOXAPARIN SODIUM 60 MG/0.6ML ~~LOC~~ SOLN
60.0000 mg | Freq: Two times a day (BID) | SUBCUTANEOUS | Status: DC
  Administered 2014-01-16: 60 mg via SUBCUTANEOUS
  Filled 2014-01-15 (×2): qty 0.6

## 2014-01-15 NOTE — Progress Notes (Signed)
Patient Demographics  Sherri Stafford, is a 63 y.o. female, DOB - February 17, 1951, HYI:[id number]  Admit date - 01/14/2014   Admitting Physician Phillips Climes, MD  Outpatient Primary MD for the patient is [name], MD  LOS - 1   Chief Complaint  Patient presents with  . Chest Pain     Brief narrative:  Sherri Stafford is a 63 y.o. female, with known history of metastatic stage IV lung adenocarcinoma,status post palliative radiotherapy to the left hip between 06/12 to 09/30/2013 at [facility]. status post stereotactic radiotherapy to brain lesions under the care of Dr. Katherine Roan at [facility] on 11/17/2013, as well most recent chemotherapy was yesterday by Dr. Earlie Server, patient presents with complaints of chest pain and shortness of breath, patient reports chest pain started yesterday evening midsternal pressure like quality worsened by deep breath, cough, and ambulation, not have any reproducible chest pain on exam, as well reports shortness of breath, patient first troponin was negative, given her cancer history she had CT chest angiogram which can significant for large PE in the right lower lobe pulmonary artery. Discussed with pulmonary on call Dr. Lake Bells, and oncology on call Dr. Alen Blew, regarding anticoagulation, recommendation was for heparin drip, and to transition to subcutaneous Lovenox upon discharge.    Subjective:   Sherri Stafford today has, No headache, No chest pain, No abdominal pain - No Nausea, No new weakness tingling or numbness, No  SOB. Complains of mild cough.  Assessment & Plan    Principal Problem:   Pulmonary embolism Active Problems:   Adenocarcinoma of lung   Chest pain   Acute bronchitis  Pulmonary embolism:  -Patient was on heparin drip overnight, no tachycardia, no arrhythmias, shortness of breath  much improved, she'll be transitioned to subcutaneous Lovenox treatment dose, in anticipation of discharge if remains stable. Husband will be taught how to inject Lovenox. -2-D echo still pending. Chest pain:  This is most likely related to her PE, resolved, has negative 3 sets of cardiac enzymes. Acute bronchitis:  -on levofloxacin.  Adenocarcinoma of the lung:  Patient is followed by Dr. Earlie Server, if there is any need for this consultation we'll contact him, meanwhile he was added to her treatment .  Code Status: patient has a living well, husband is her health care power of attorney, patient is a full code   Family Communication: Husband is at bedside.  Disposition Plan: Home when stable.   Procedures  None   Consults   Oncology over the phone Pulmonary/critical-care over the phone   Medications  Scheduled Meds: . docusate sodium  100 mg Oral BID  . enoxaparin (LOVENOX) injection  60 mg Subcutaneous Q12H  . [START ON 01/16/2014] enoxaparin (LOVENOX) injection  60 mg Subcutaneous Q12H  . gabapentin  600 mg Oral TID  . guaiFENesin  600 mg Oral BID  . levofloxacin (LEVAQUIN) IV  500 mg Intravenous Q24H  . pantoprazole  40 mg Oral Daily  . senna  2 tablet Oral BID   Continuous Infusions: . sodium chloride 50 mL/hr at 01/15/14 1021   PRN Meds:.albuterol, bisacodyl, codeine, polyethylene glycol, polyvinyl alcohol, prochlorperazine, promethazine, sorbitol  DVT Prophylaxis  Lovenox treatment dose.  Lab Results  Component Value Date  PLT 193 01/15/2014    Antibiotics    Anti-infectives   Start     Dose/Rate Route Frequency Ordered Stop   01/14/14 1800  levofloxacin (LEVAQUIN) IVPB 500 mg     500 mg 100 mL/hr over 60 Minutes Intravenous Every 24 hours 01/14/14 1747            Objective:   Filed Vitals:   01/15/14 0756 01/15/14 0800 01/15/14 0900 01/15/14 1000  BP:  105/62    Pulse:  99 102 102  Temp: 98.1 F (36.7 C)     TempSrc: Oral     Resp:  18 22  20   Height:      Weight:      SpO2:  95% 100% 96%    Wt Readings from Last 3 Encounters:  01/15/14 59.8 kg (131 lb 13.4 oz)  01/14/14 59.557 kg (131 lb 4.8 oz)  01/06/14 59.013 kg (130 lb 1.6 oz)     Intake/Output Summary (Last 24 hours) at 01/15/14 1129 Last data filed at 01/15/14 1000  Gross per 24 hour  Intake 1110.66 ml  Output   1550 ml  Net -439.34 ml     Physical Exam  Awake Alert, Oriented X 3, No new F.N deficits, Normal affect [place].AT,PERRAL Supple Neck,No JVD, No cervical lymphadenopathy appriciated.  Symmetrical Chest wall movement, Good air movement bilaterally, scattered rails. RRR,No Gallops,Rubs or new Murmurs, No Parasternal Heave +ve B.Sounds, Abd Soft, No tenderness, No organomegaly appriciated, No rebound - guarding or rigidity. No Cyanosis, Clubbing or edema, No new Rash or bruise     Data Review   Micro Results Recent Results (from the past 240 hour(s))  MRSA PCR SCREENING     Status: None   Collection Time    01/14/14  5:48 PM      Result Value Ref Range Status   MRSA by PCR NEGATIVE  NEGATIVE Final   Comment:            The GeneXpert MRSA Assay (FDA     approved for NASAL specimens     only), is one component of a     comprehensive MRSA colonization     surveillance program. It is not     intended to diagnose MRSA     infection nor to guide or     monitor treatment for     MRSA infections.    Radiology Reports Dg Chest 2 View  01/14/2014   CLINICAL DATA:  63 year old female with chest pain. Status post initiation of chemotherapy.  EXAM: CHEST - 2 VIEW  COMPARISON:  Multiple prior. Most recent CT chest 11/22/2013. Most recent x-ray 11/14/2013.  FINDINGS: Cardiomediastinal silhouette within normal limits in size and contour.  Similar size number and distribution of bilateral pulmonary nodules/masses. Again dominant mass within the left lower lobe.  No confluent airspace disease. No pleural effusion. No pneumothorax.  A interval placement of  double-lumen right-sided port catheter in the right internal jugular vein with the tip appearing to terminate in the right atrium.  Re- demonstration of fracture of the right ninth rib.  IMPRESSION: No radiographic evidence of acute cardiopulmonary disease.  Similar size, number, and distribution of multiple bilateral pulmonary nodules/ masses as compared to prior plain film and cross-sectional imaging.  Re- demonstration of right ninth rib fracture without significant callus formation.  Interval placement of double-lumen port catheter on the right chest wall, with the tip appearing to terminate in the right atrium.  Signed,  Dulcy Fanny. Earleen Newport, DO  [facility]  [facility]   Electronically Signed   By: Corrie Mckusick D.O.   On: 01/14/2014 15:22   Ct Angio Chest Pe W/cm &/or Wo Cm  01/14/2014   CLINICAL DATA:  Chest pain; history of lung malignancy currently undergoing chemotherapy ; history of sarcoidosis and remote history of tobacco use ; history of right ninth rib fracture  EXAM: CT ANGIOGRAPHY CHEST WITH CONTRAST  TECHNIQUE: Multidetector CT imaging of the chest was performed using the standard protocol during bolus administration of intravenous contrast. Multiplanar CT image reconstructions and MIPs were obtained to evaluate the vascular anatomy.  CONTRAST:  165mL OMNIPAQUE IOHEXOL 300 MG/ML  SOLN intravenously  COMPARISON:  PA and lateral chest x-ray of today's date  FINDINGS: There is bulky diffuse mediastinal lymphadenopathy. There are enlarged hilar lymph nodes greater on the left than on the right. There are large filling defects within branches of the right lower lobe pulmonary artery. Definite filling defects elsewhere in the pulmonary artery tree are not demonstrated. The RV -LV ratio is 1.35. The heart overall is not enlarged. There is no pericardial effusion. There is a right internal jugular power port catheter in place. The thoracic aorta is  unremarkable. The thyroid gland is unremarkable. The thoracic esophagus exhibits no acute abnormalities.  There are multiple bilateral soft tissue density parenchymal masses of varying sizes. The largest measures 3.3 cm in diameter. There is no alveolar pneumonia. There is no evidence of pulmonary infarction.  Within the upper abdomen there are abnormal hypodensities posteriorly in the right hepatic lobe. The partially imaged adrenal glands are normal. The spleen exhibits no acute abnormality.  Review of the MIP images confirms the above findings.  IMPRESSION: 1. There are large central filling defects in branches of the right lower lobe pulmonary artery consistent with acute pulmonary embolism. There is evidence of right ventricular strain. Positive for acute PE with CT evidence of right heartstrain (RV/LV Ratio = 1.35) consistent with at least submassive (intermediate risk)PE. The presence of right heart strain has been associated with anincreased risk of morbidity and mortality. Consultation with [facility] is recommended. 2. There is bulky mediastinal and left hilar lymphadenopathy. There are numerous soft tissue density masses throughout both lungs consistent with intrapulmonary metastatic disease. 3. There is are hypodense masses in the liver worrisome for metastatic disease. 4. Critical Value/emergent results were called by telephone at the time of interpretation on 01/14/2014 at 4:35 pm to Dr. Colin Rhein, who verbally acknowledged these results.   Electronically Signed   By: David  Martinique   On: 01/14/2014 16:43    CBC  Recent Labs Lab 01/13/14 1011 01/14/14 1443 01/15/14 0540  WBC 13.6* 11.1* 9.9  HGB 9.6* 9.3* 8.4*  HCT 29.2* 28.3* 25.8*  PLT 282 232 193  MCV 91.7 91.6 91.5  MCH 30.1 30.1 29.8  MCHC 32.9 32.9 32.6  RDW 20.1* 19.3* 19.4*  LYMPHSABS 1.2 0.7  --   MONOABS 0.9 0.8  --   EOSABS 0.3 0.2  --   BASOSABS 0.1 0.0  --     Chemistries   Recent Labs Lab  01/13/14 1011 01/14/14 1443 01/15/14 0540  NA 139 135* 138  K 4.1 3.9 3.9  CL  --  99 102  CO2 25 22 24   GLUCOSE 114 131* 118*  BUN 19.6 18 13   CREATININE 0.8 0.70 0.82  CALCIUM 9.5 9.0 8.6  AST 11  --   --   ALT 10  --   --  ALKPHOS 70  --   --   BILITOT 0.87  --   --    ------------------------------------------------------------------------------------------------------------------ estimated creatinine clearance is 59.6 ml/min (by C-G formula based on Cr of 0.82). ------------------------------------------------------------------------------------------------------------------ No results found for this basename: HGBA1C,  in the last 72 hours ------------------------------------------------------------------------------------------------------------------ No results found for this basename: CHOL, HDL, LDLCALC, TRIG, CHOLHDL, LDLDIRECT,  in the last 72 hours ------------------------------------------------------------------------------------------------------------------  Recent Labs  01/13/14 1011  TSH 2.738   ------------------------------------------------------------------------------------------------------------------ No results found for this basename: VITAMINB12, FOLATE, FERRITIN, TIBC, IRON, RETICCTPCT,  in the last 72 hours  Coagulation profile  Recent Labs Lab 01/14/14 1700  INR 1.25    No results found for this basename: DDIMER,  in the last 72 hours  Cardiac Enzymes  Recent Labs Lab 01/14/14 1748 01/15/14 0001 01/15/14 0540  TROPONINI <0.30 <0.30 <0.30   ------------------------------------------------------------------------------------------------------------------ No components found with this basename: POCBNP,      Time Spent in minutes   30 minutes.   [facility], Sherri Stafford M.D on 01/15/2014 at 11:29 AM  Between 7am to 7pm - Pager - [phone]  After 7pm go to [email or web address] - password [email or web address]  And look for the night coverage person  covering for me after hours  [facility] Office  [phone]   **Disclaimer: This note may have been dictated with voice recognition software. Similar sounding words can inadvertently be transcribed and this note may contain transcription errors which may not have been corrected upon publication of note.**

## 2014-01-15 NOTE — Progress Notes (Signed)
ANTICOAGULATION CONSULT NOTE - Follow Up Consult  Pharmacy Consult for Heparin Indication: pulmonary embolus  Allergies  Allergen Reactions  . Other     Opiates cause severe constipation. (to the point of being hospitalized)  . Ketoconazole Hives  . Lorazepam Hives    Broke out in hives at [facility]   . Morphine And Related Other (See Comments)    "Constipation to point of being hospitalized"  . Nystatin Hives  . Sporanox [Itraconazole] Hives  . Tylenol [Acetaminophen] Other (See Comments)    Rosanna Randy Syndrome   . Vitamin D Other (See Comments)    Gilbert's syndrome, elevated liver enzymes  . Zofran [Ondansetron Hcl] Hives    Hives at [facility]     Patient Measurements: Height: 5\' 3"  (160 cm) Weight: 131 lb 4.8 oz (59.557 kg) IBW/kg (Calculated) : 52.4 Heparin Dosing Weight:   Vital Signs: Temp: 98 F (36.7 C) (10/03 0000) Temp Source: Oral (10/02 2352) BP: 114/49 mmHg (10/03 0400) Pulse Rate: 103 (10/03 0400)  Labs:  Recent Labs  01/13/14 1011 01/14/14 1443 01/14/14 1700 01/14/14 1748 01/15/14 0001  HGB 9.6* 9.3*  --   --   --   HCT 29.2* 28.3*  --   --   --   PLT 282 232  --   --   --   APTT  --   --  36  --   --   LABPROT  --   --  15.8*  --   --   INR  --   --  1.25  --   --   HEPARINUNFRC  --   --   --   --  0.55  CREATININE 0.8 0.70  --   --   --   TROPONINI  --  <0.30  --  <0.30 <0.30    Estimated Creatinine Clearance: 61.1 ml/min (by C-G formula based on Cr of 0.7).   Medications:  Infusions:  . sodium chloride 50 mL/hr at 01/14/14 1756  . heparin 1,000 Units/hr (01/14/14 1816)    Assessment: Patient with heparin level at goal.  No issues noted.  Goal of Therapy:  Heparin level 0.3-0.7 units/ml Monitor platelets by anticoagulation protocol: Yes   Plan:  Continue heparin drip at current rate Recheck level at 0800  Tyler Deis, Shea Stakes Crowford 01/15/2014,5:16 AM

## 2014-01-15 NOTE — Progress Notes (Signed)
ANTICOAGULATION CONSULT NOTE - Initial Consult  Pharmacy Consult for Lovenox Indication: pulmonary embolus  Allergies  Allergen Reactions  . Other     Opiates cause severe constipation. (to the point of being hospitalized)  . Ketoconazole Hives  . Lorazepam Hives    Broke out in hives at [facility]   . Morphine And Related Other (See Comments)    "Constipation to point of being hospitalized"  . Nystatin Hives  . Sporanox [Itraconazole] Hives  . Tylenol [Acetaminophen] Other (See Comments)    Rosanna Randy Syndrome   . Vitamin D Other (See Comments)    Gilbert's syndrome, elevated liver enzymes  . Zofran [Ondansetron Hcl] Hives    Hives at [facility]    Patient Measurements: Height: 5\' 3"  (160 cm) Weight: 131 lb 13.4 oz (59.8 kg) IBW/kg (Calculated) : 52.4  Vital Signs: Temp: 98.1 F (36.7 C) (10/03 0756) Temp Source: Oral (10/03 0756) BP: 105/62 mmHg (10/03 0800) Pulse Rate: 102 (10/03 1000)  Labs:  Recent Labs  01/13/14 1011  01/14/14 1443 01/14/14 1700 01/14/14 1748 01/15/14 0001 01/15/14 0540 01/15/14 0808  HGB 9.6*  --  9.3*  --   --   --  8.4*  --   HCT 29.2*  --  28.3*  --   --   --  25.8*  --   PLT 282  --  232  --   --   --  193  --   APTT  --   --   --  36  --   --   --   --   LABPROT  --   --   --  15.8*  --   --   --   --   INR  --   --   --  1.25  --   --   --   --   HEPARINUNFRC  --   --   --   --   --  0.55  --  0.41  CREATININE 0.8  --  0.70  --   --   --  0.82  --   TROPONINI  --   < > <0.30  --  <0.30 <0.30 <0.30  --   < > = values in this interval not displayed.  Estimated Creatinine Clearance: 59.6 ml/min (by C-G formula based on Cr of 0.82).  Medical History: Past Medical History  Diagnosis Date  . Arthritis   . Bilateral ovarian cysts   . GERD (gastroesophageal reflux disease)   . Strain of hip flexor 06/2013    left side torn  . Cancer     lung ca   Assessment: 75 yoF with history of lung cancer presents with chest pain and dyspnea.  CT scan  resulted with signs of PE and right heart strain.  Pharmacy consulted to manage IV heparin. CBC: Hgb 9.6-> 8.4 (appears near baseline from last month), platelets 282-> 193 Renal: SCr wnl  Significant events: 10/2: Heparin bolus 3000 units, infusion started at 1000 units/hr at 1800 10/3: first heparin level in range (0.55 units/ml), this am heparin level 0.41 on 1000 units/hr. Orders received to change to Lovenox per Pharmacy protocol.  Goal of Therapy:  Anti-Xa levels 0.6-1 units/ml 4hr after dose Monitor platelets by anticoagulation protocol: Yes   Plan:   One hour after heparin stops, begin Lovenox 60mg  sq q12h  Monitor weight/renal function, adjust dose as needed  Monitor for signs/symptoms of bleeding  Peggyann Juba, PharmD, BCPS Pager: [phone] 01/15/2014, 11:26 AM

## 2014-01-15 NOTE — Progress Notes (Signed)
  Echocardiogram 2D Echocardiogram has been performed.  Sherri Stafford 01/15/2014, 11:42 AM

## 2014-01-15 NOTE — Plan of Care (Signed)
Problem: ICU Phase Progression Outcomes Goal: O2 sats trending toward baseline Outcome: Completed/Met Date Met:  01/15/14 Pt is on room air and not requiring any oxygen Goal: Flu/PneumoVaccines if indicated Outcome: Not Met (add Reason) Pt had Influenza 01/13/13, No Pneumococcal vaccine. Pt wants to check with her Oncologist before taking any vaccine at this time.

## 2014-01-15 NOTE — Progress Notes (Signed)
ANTICOAGULATION CONSULT NOTE -   Pharmacy Consult for Heparin Indication: pulmonary embolus  Allergies  Allergen Reactions  . Other     Opiates cause severe constipation. (to the point of being hospitalized)  . Ketoconazole Hives  . Lorazepam Hives    Broke out in hives at [facility]   . Morphine And Related Other (See Comments)    "Constipation to point of being hospitalized"  . Nystatin Hives  . Sporanox [Itraconazole] Hives  . Tylenol [Acetaminophen] Other (See Comments)    Rosanna Randy Syndrome   . Vitamin D Other (See Comments)    Gilbert's syndrome, elevated liver enzymes  . Zofran [Ondansetron Hcl] Hives    Hives at [facility]    Patient Measurements: Height: 5\' 3"  (160 cm) Weight: 131 lb 13.4 oz (59.8 kg) IBW/kg (Calculated) : 52.4 Heparin Dosing Weight: 59.6 kg (actual body weight)  Vital Signs: Temp: 98.1 F (36.7 C) (10/03 0756) Temp Source: Oral (10/03 0756) BP: 105/62 mmHg (10/03 0800) Pulse Rate: 99 (10/03 0800)  Labs:  Recent Labs  01/13/14 1011  01/14/14 1443 01/14/14 1700 01/14/14 1748 01/15/14 0001 01/15/14 0540 01/15/14 0808  HGB 9.6*  --  9.3*  --   --   --  8.4*  --   HCT 29.2*  --  28.3*  --   --   --  25.8*  --   PLT 282  --  232  --   --   --  193  --   APTT  --   --   --  36  --   --   --   --   LABPROT  --   --   --  15.8*  --   --   --   --   INR  --   --   --  1.25  --   --   --   --   HEPARINUNFRC  --   --   --   --   --  0.55  --  0.41  CREATININE 0.8  --  0.70  --   --   --  0.82  --   TROPONINI  --   < > <0.30  --  <0.30 <0.30 <0.30  --   < > = values in this interval not displayed.  Estimated Creatinine Clearance: 59.6 ml/min (by C-G formula based on Cr of 0.82).  Medical History: Past Medical History  Diagnosis Date  . Arthritis   . Bilateral ovarian cysts   . GERD (gastroesophageal reflux disease)   . Strain of hip flexor 06/2013    left side torn  . Cancer     lung ca   Assessment: 68 yoF with history of lung cancer presents  with chest pain and dyspnea.  CT scan resulted with signs of PE and right heart strain.  Pharmacy consulted to manage IV heparin. CBC: Hgb 9.6-> 8.4 (appears near baseline from last month), platelets 282-> 193 Renal: SCr wnl  Significant events: 10/2: Heparin bolus 3000 units, infusion started at 1000 units/hr at 1800 10/3: first heparin level in range (0.55 units/ml), this am heparin level 0.41 on 1000 units/hr  Goal of Therapy:  Heparin level 0.3-0.7 units/ml Monitor platelets by anticoagulation protocol: Yes   Plan:   Continue Heparin at 1000 units/hr  Repeat Hep level at 8pm, ensure levels remain therapeutic after bolus effect  Daily Hep level, CBC  With current chemotherapy: treatment dose Lovenox is preferred long-term therapy for PE  Nyoka Cowden, Eban Weick  L PharmD Pager [phone] 01/15/2014, 9:22 AM

## 2014-01-16 LAB — BASIC METABOLIC PANEL
ANION GAP: 11 (ref 5–15)
BUN: 7 mg/dL (ref 6–23)
CALCIUM: 8 mg/dL — AB (ref 8.4–10.5)
CHLORIDE: 106 meq/L (ref 96–112)
CO2: 24 mEq/L (ref 19–32)
CREATININE: 0.63 mg/dL (ref 0.50–1.10)
GFR calc Af Amer: >90 mL/min (ref >90)
GFR calc non Af Amer: >90 mL/min (ref >90)
GLUCOSE: 100 mg/dL — AB (ref 70–99)
Potassium: 3.5 mEq/L — ABNORMAL LOW (ref 3.7–5.3)
Sodium: 141 mEq/L (ref 137–147)

## 2014-01-16 LAB — CBC
HEMATOCRIT: 24.4 % — AB (ref 36.0–46.0)
HEMOGLOBIN: 8.1 g/dL — AB (ref 12.0–15.0)
MCH: 30.2 pg (ref 26.0–34.0)
MCHC: 33.2 g/dL (ref 30.0–36.0)
MCV: 91 fL (ref 78.0–100.0)
Platelets: 171 10*3/uL (ref 150–400)
RBC: 2.68 MIL/uL — AB (ref 3.87–5.11)
RDW: 19.3 % — ABNORMAL HIGH (ref 11.5–15.5)
WBC: 6.2 10*3/uL (ref 4.0–10.5)

## 2014-01-16 MED ORDER — HEPARIN SOD (PORK) LOCK FLUSH 100 UNIT/ML IV SOLN
500.0000 [IU] | INTRAVENOUS | Status: AC | PRN
  Administered 2014-01-16: 500 [IU]

## 2014-01-16 MED ORDER — LEVOFLOXACIN 500 MG PO TABS: 500.0000 mg | ORAL_TABLET | Freq: Every day | ORAL | Status: AC

## 2014-01-16 MED ORDER — ESOMEPRAZOLE MAGNESIUM 40 MG PO CPDR: 40.0000 mg | DELAYED_RELEASE_CAPSULE | Freq: Two times a day (BID) | ORAL | Status: DC

## 2014-01-16 MED ORDER — LORATADINE 10 MG PO TABS: 10.0000 mg | ORAL_TABLET | Freq: Every day | ORAL | Status: DC

## 2014-01-16 MED ORDER — ENOXAPARIN SODIUM 100 MG/ML ~~LOC~~ SOLN: 1.5000 mg/kg | SUBCUTANEOUS | Status: DC

## 2014-01-16 MED ORDER — LEVOFLOXACIN 500 MG PO TABS: 500.0000 mg | ORAL_TABLET | Freq: Every day | ORAL | Status: DC

## 2014-01-16 MED ORDER — ENOXAPARIN SODIUM 30 MG/0.3ML ~~LOC~~ SOLN
30.0000 mg | Freq: Once | SUBCUTANEOUS | Status: AC
  Administered 2014-01-16: 30 mg via SUBCUTANEOUS
  Filled 2014-01-16: qty 0.3

## 2014-01-16 MED ORDER — LORATADINE 10 MG PO TABS
10.0000 mg | ORAL_TABLET | Freq: Every day | ORAL | Status: DC
Start: 2014-01-16 — End: 2014-05-05

## 2014-01-16 MED ORDER — ESOMEPRAZOLE MAGNESIUM 40 MG PO CPDR: 20.0000 mg | DELAYED_RELEASE_CAPSULE | Freq: Two times a day (BID) | ORAL | Status: DC

## 2014-01-16 MED ORDER — LEVOFLOXACIN 500 MG PO TABS
500.0000 mg | ORAL_TABLET | Freq: Every day | ORAL | Status: DC
Start: 2014-01-16 — End: 2014-01-16

## 2014-01-16 NOTE — Discharge Summary (Signed)
Sherri Stafford, 63 y.o., DOB 1951/03/20, MRN [id number]. Admission date: 01/14/2014 Discharge Date 01/16/2014 Primary MD [name], MD Admitting Physician Phillips Climes, MD  Admission Diagnosis  Other chest pain [R07.89] Chest tightness [R07.89] Pulmonary embolism [I26.99] Dyspnea [R06.00] Adenocarcinoma of lung, unspecified laterality [C34.90] Acute bronchitis, unspecified organism [J20.9] Anemia, unspecified anemia type [D64.9]  Discharge Diagnosis   Principal Problem:   Pulmonary embolism Active Problems:   Adenocarcinoma of lung   Chest pain   Acute bronchitis      Past Medical History  Diagnosis Date  . Arthritis   . Bilateral ovarian cysts   . GERD (gastroesophageal reflux disease)   . Strain of hip flexor 06/2013    left side torn  . Cancer     lung ca    Past Surgical History  Procedure Laterality Date  . Dilation and curettage of uterus  2004    x 3   . Tubal ligation    . Colonoscopy  04/05/2004    normal    Brief narrative:  Sherri Stafford is a 63 y.o. female, with known history of metastatic stage IV lung adenocarcinoma,status post palliative radiotherapy to the left hip between 06/12 to 09/30/2013 at [facility]. status post stereotactic radiotherapy to brain lesions under the care of Dr. Katherine Roan at [facility] on 11/17/2013, as well most recent chemotherapy was yesterday by Dr. Earlie Server, patient presents with complaints of chest pain and shortness of breath, patient reports chest pain started yesterday evening midsternal pressure like quality worsened by deep breath, cough, and ambulation, not have any reproducible chest pain on exam, as well reports shortness of breath, patient first troponin was negative, given her cancer history she had CT chest angiogram which can significant for large PE in the right lower lobe pulmonary artery.  Discussed with pulmonary on call Dr. Lake Bells, and oncology on call Dr. Alen Blew,  regarding anticoagulation, recommendation was for heparin drip, and to transition to subcutaneous Lovenox upon discharge.   Hospital Course See H&P, Labs, Consult and Test reports for all details in brief, patient was admitted for **  Principal Problem:   Pulmonary embolism Active Problems:   Adenocarcinoma of lung   Chest pain   Acute bronchitis Pulmonary embolism:  -Patient was on heparin drip initially,  no tachycardia, no arrhythmias, shortness of breath much improved,  transitioned to subcutaneous Lovenox treatment dose, husband wastaught how to inject Lovenox. The patient ambulated well on the hallway a few times, with no hypoxia, no shortness of breath, mild tachycardia. 2-D echo was done which did not show any evidence of right ventricular strain, normal ejection fraction, a grade 1 diastolic dysfunction.  Chest pain:  This is most likely related to her PE, resolved, has negative 3 sets of cardiac enzymes.   Acute bronchitis:  -on levofloxacin. He finished extra 3 days after discharge, as well was prescribed Claritin as she does describe symptoms of postnasal drip, most likely contributing to her morning cough, as well her Nexium was increased to 2 times a day as GERD can contribute to her at a morning cough.  Adenocarcinoma of the lung:  Patient is followed by Dr. Earlie Server, she was seen by him during this hospital stay.  Consults   Oncology consult  Procedures CT chest angiogram 10/2 2-D echo 10/30  Discussed with patient and husband at bedside, all their questions were answered to their satisfaction.  Significant Tests:  See full reports for all details    Dg Chest 2  View  01/14/2014   CLINICAL DATA:  63 year old female with chest pain. Status post initiation of chemotherapy.  EXAM: CHEST - 2 VIEW  COMPARISON:  Multiple prior. Most recent CT chest 11/22/2013. Most recent x-ray 11/14/2013.  FINDINGS: Cardiomediastinal silhouette within normal limits in size and contour.   Similar size number and distribution of bilateral pulmonary nodules/masses. Again dominant mass within the left lower lobe.  No confluent airspace disease. No pleural effusion. No pneumothorax.  A interval placement of double-lumen right-sided port catheter in the right internal jugular vein with the tip appearing to terminate in the right atrium.  Re- demonstration of fracture of the right ninth rib.  IMPRESSION: No radiographic evidence of acute cardiopulmonary disease.  Similar size, number, and distribution of multiple bilateral pulmonary nodules/ masses as compared to prior plain film and cross-sectional imaging.  Re- demonstration of right ninth rib fracture without significant callus formation.  Interval placement of double-lumen port catheter on the right chest wall, with the tip appearing to terminate in the right atrium.  Signed,  Dulcy Fanny. Earleen Newport, DO  [facility]  [facility]   Electronically Signed   By: Corrie Mckusick D.O.   On: 01/14/2014 15:22   Ct Chest W Contrast  12/31/2013   CLINICAL DATA:  History of lung cancer diagnosed in May 2015. Chemotherapy in progress. Radiation therapy complete. Shortness of breath. Right lower quadrant pain. Constipation.  EXAM: CT CHEST WITH CONTRAST  TECHNIQUE: Multidetector CT imaging of the chest was performed during intravenous contrast administration.  CONTRAST:  162mL OMNIPAQUE IOHEXOL 300 MG/ML  SOLN  COMPARISON:  CT of the chest, abdomen and pelvis 11/22/2013.  FINDINGS: CT CHEST FINDINGS  Mediastinum: Bulky right supraclavicular lymphadenopathy has significantly increased compared to the prior examination. Image 6 of series 2 demonstrates an enlarging conglomeration of lymph nodes measuring 5.6 x 2.4 cm in the right supraclavicular region. Inferior aspect of the right internal jugular vein appears thrombosed, new compared to the prior examination. A right internal jugular double-lumen Port-A-Cath is present with  tip terminating in the right atrium. The superior vena cava remains patent at this time. Compared to the prior examinations, there is significantly worsened mediastinal and left hilar lymphadenopathy. In the superior mediastinum markedly enlarged lymph nodes completely encase multiple vessels, including the innominate artery, best appreciated on image 13 of series 2. Markedly enlarged right paratracheal lymph node measuring 2.4 cm is significantly increased compared to the prior examination at which point it measured 17 mm. Subcarinal node is also significantly increased compared to the prior examination, measuring up to 3.9 x 2.4 cm, as is extensive bulky left hilar lymphadenopathy. Heart size is normal. There is no significant pericardial fluid, thickening or pericardial calcification. Esophagus is unremarkable in appearance.  Lungs/Pleura: Multiple large pulmonary nodules and masses are noted in the left lower lobe, the largest mass or conglomeration of nodules measures 3.6 x 4.3 cm (image 30 of series 5). Numerous other smaller pulmonary nodules are seen throughout the lungs bilaterally, compatible with widespread metastatic disease. These have increased in size compared to the prior examination, with a specific example of this a 2.2 x 1.4 cm nodule in the medial aspect of the left upper lobe (image 16 of series 5), which previously measured 1.9 x 1.2 cm on prior study 11/22/2013. Some other nodules are stable in size. No definite new nodules are noted. Trace left pleural effusion layering dependently.  Musculoskeletal: There are no aggressive appearing lytic or blastic lesions noted in  the visualized portions of the skeleton.  CT ABDOMEN AND PELVIS FINDINGS  Abdomen/Pelvis: Interval enlargement of 2 hypovascular liver lesions in the right lobe of the liver, largest of which measures 1.9 x 1.4 cm in segment 7 (image 46 of series 2), presumably enlarging hepatic metastases. No new hepatic lesions are noted. The  appearance of the gallbladder, pancreas, bilateral adrenal glands and bilateral kidneys is unremarkable. The spleen appears borderline enlarged measuring up to 11.4 cm.  No significant volume of ascites. No pneumoperitoneum. No pathologic distention of small bowel. No lymphadenopathy noted in the abdomen or pelvis. Mild atherosclerosis throughout the abdominal and pelvic vasculature, without evidence of aneurysm. Uterus and ovaries are unremarkable in appearance. Urinary bladder is normal in appearance.  Musculoskeletal: Large lytic lesion measuring approximately 2.6 x 2.1 cm in S1 again noted.  IMPRESSION: 1. Today's study demonstrates progression of disease, predominantly manifest by marked enlargement of right supraclavicular, mediastinal and left hilar lymphadenopathy, as well as several left lower lobe pulmonary nodules. Several other pulmonary nodules in the lungs bilaterally have also enlarged, while many others appear stable. No definite new pulmonary nodules are noted. 2. In addition, there has been some interval enlargement of 2 metastatic lesions in segment 7 of the liver. 3. Large lytic lesion in S1 again noted, presumably a metastatic lesion. 4. Additional incidental findings, as above.   Electronically Signed   By: Vinnie Langton M.D.   On: 12/31/2013 16:53   Ct Angio Chest Pe W/cm &/or Wo Cm  01/14/2014   CLINICAL DATA:  Chest pain; history of lung malignancy currently undergoing chemotherapy ; history of sarcoidosis and remote history of tobacco use ; history of right ninth rib fracture  EXAM: CT ANGIOGRAPHY CHEST WITH CONTRAST  TECHNIQUE: Multidetector CT imaging of the chest was performed using the standard protocol during bolus administration of intravenous contrast. Multiplanar CT image reconstructions and MIPs were obtained to evaluate the vascular anatomy.  CONTRAST:  164mL OMNIPAQUE IOHEXOL 300 MG/ML  SOLN intravenously  COMPARISON:  PA and lateral chest x-ray of today's date  FINDINGS:  There is bulky diffuse mediastinal lymphadenopathy. There are enlarged hilar lymph nodes greater on the left than on the right. There are large filling defects within branches of the right lower lobe pulmonary artery. Definite filling defects elsewhere in the pulmonary artery tree are not demonstrated. The RV -LV ratio is 1.35. The heart overall is not enlarged. There is no pericardial effusion. There is a right internal jugular power port catheter in place. The thoracic aorta is unremarkable. The thyroid gland is unremarkable. The thoracic esophagus exhibits no acute abnormalities.  There are multiple bilateral soft tissue density parenchymal masses of varying sizes. The largest measures 3.3 cm in diameter. There is no alveolar pneumonia. There is no evidence of pulmonary infarction.  Within the upper abdomen there are abnormal hypodensities posteriorly in the right hepatic lobe. The partially imaged adrenal glands are normal. The spleen exhibits no acute abnormality.  Review of the MIP images confirms the above findings.  IMPRESSION: 1. There are large central filling defects in branches of the right lower lobe pulmonary artery consistent with acute pulmonary embolism. There is evidence of right ventricular strain. Positive for acute PE with CT evidence of right heartstrain (RV/LV Ratio = 1.35) consistent with at least submassive (intermediate risk)PE. The presence of right heart strain has been associated with anincreased risk of morbidity and mortality. Consultation with [place] is recommended. 2. There is bulky mediastinal and left hilar  lymphadenopathy. There are numerous soft tissue density masses throughout both lungs consistent with intrapulmonary metastatic disease. 3. There is are hypodense masses in the liver worrisome for metastatic disease. 4. Critical Value/emergent results were called by telephone at the time of interpretation on 01/14/2014 at 4:35 pm to Dr. Colin Rhein, who  verbally acknowledged these results.   Electronically Signed   By: David  Martinique   On: 01/14/2014 16:43   Ct Abdomen Pelvis W Contrast  12/31/2013   CLINICAL DATA:  History of lung cancer diagnosed in May 2015. Chemotherapy in progress. Radiation therapy complete. Shortness of breath. Right lower quadrant pain. Constipation.  EXAM: CT CHEST WITH CONTRAST  TECHNIQUE: Multidetector CT imaging of the chest was performed during intravenous contrast administration.  CONTRAST:  121mL OMNIPAQUE IOHEXOL 300 MG/ML  SOLN  COMPARISON:  CT of the chest, abdomen and pelvis 11/22/2013.  FINDINGS: CT CHEST FINDINGS  Mediastinum: Bulky right supraclavicular lymphadenopathy has significantly increased compared to the prior examination. Image 6 of series 2 demonstrates an enlarging conglomeration of lymph nodes measuring 5.6 x 2.4 cm in the right supraclavicular region. Inferior aspect of the right internal jugular vein appears thrombosed, new compared to the prior examination. A right internal jugular double-lumen Port-A-Cath is present with tip terminating in the right atrium. The superior vena cava remains patent at this time. Compared to the prior examinations, there is significantly worsened mediastinal and left hilar lymphadenopathy. In the superior mediastinum markedly enlarged lymph nodes completely encase multiple vessels, including the innominate artery, best appreciated on image 13 of series 2. Markedly enlarged right paratracheal lymph node measuring 2.4 cm is significantly increased compared to the prior examination at which point it measured 17 mm. Subcarinal node is also significantly increased compared to the prior examination, measuring up to 3.9 x 2.4 cm, as is extensive bulky left hilar lymphadenopathy. Heart size is normal. There is no significant pericardial fluid, thickening or pericardial calcification. Esophagus is unremarkable in appearance.  Lungs/Pleura: Multiple large pulmonary nodules and masses are  noted in the left lower lobe, the largest mass or conglomeration of nodules measures 3.6 x 4.3 cm (image 30 of series 5). Numerous other smaller pulmonary nodules are seen throughout the lungs bilaterally, compatible with widespread metastatic disease. These have increased in size compared to the prior examination, with a specific example of this a 2.2 x 1.4 cm nodule in the medial aspect of the left upper lobe (image 16 of series 5), which previously measured 1.9 x 1.2 cm on prior study 11/22/2013. Some other nodules are stable in size. No definite new nodules are noted. Trace left pleural effusion layering dependently.  Musculoskeletal: There are no aggressive appearing lytic or blastic lesions noted in the visualized portions of the skeleton.  CT ABDOMEN AND PELVIS FINDINGS  Abdomen/Pelvis: Interval enlargement of 2 hypovascular liver lesions in the right lobe of the liver, largest of which measures 1.9 x 1.4 cm in segment 7 (image 46 of series 2), presumably enlarging hepatic metastases. No new hepatic lesions are noted. The appearance of the gallbladder, pancreas, bilateral adrenal glands and bilateral kidneys is unremarkable. The spleen appears borderline enlarged measuring up to 11.4 cm.  No significant volume of ascites. No pneumoperitoneum. No pathologic distention of small bowel. No lymphadenopathy noted in the abdomen or pelvis. Mild atherosclerosis throughout the abdominal and pelvic vasculature, without evidence of aneurysm. Uterus and ovaries are unremarkable in appearance. Urinary bladder is normal in appearance.  Musculoskeletal: Large lytic lesion measuring approximately 2.6 x 2.1 cm in  S1 again noted.  IMPRESSION: 1. Today's study demonstrates progression of disease, predominantly manifest by marked enlargement of right supraclavicular, mediastinal and left hilar lymphadenopathy, as well as several left lower lobe pulmonary nodules. Several other pulmonary nodules in the lungs bilaterally have also  enlarged, while many others appear stable. No definite new pulmonary nodules are noted. 2. In addition, there has been some interval enlargement of 2 metastatic lesions in segment 7 of the liver. 3. Large lytic lesion in S1 again noted, presumably a metastatic lesion. 4. Additional incidental findings, as above.   Electronically Signed   By: Vinnie Langton M.D.   On: 12/31/2013 16:53     Today   Subjective:   Sherri Stafford today has no headache,no chest abdominal pain,no new weakness tingling or numbness, feels much better wants to go home today. Still complains of mild cough.  Objective:   Blood pressure 121/60, pulse 94, temperature 98.6 F (37 C), temperature source Oral, resp. rate 18, height 5\' 3"  (1.6 m), weight 59.8 kg (131 lb 13.4 oz), last menstrual period 01/14/2008, SpO2 95.00%.  Intake/Output Summary (Last 24 hours) at 01/16/14 1244 Last data filed at 01/16/14 1200  Gross per 24 hour  Intake   1540 ml  Output   1575 ml  Net    -35 ml    Exam Awake Alert, Oriented *3, No new F.N deficits, Normal affect [place].AT,PERRAL Supple Neck,No JVD, No cervical lymphadenopathy appriciated.  Symmetrical Chest wall movement, Good air movement bilaterally, CTAB RRR,No Gallops,Rubs or new Murmurs, No Parasternal Heave +ve B.Sounds, Abd Soft, Non tender, No organomegaly appriciated, No rebound -guarding or rigidity. No Cyanosis, Clubbing or edema, No new Rash or bruise  Data Review     CBC w Diff: Lab Results  Component Value Date   WBC 6.2 01/16/2014   WBC 13.6* 01/13/2014   HGB 8.1* 01/16/2014   HGB 9.6* 01/13/2014   HCT 24.4* 01/16/2014   HCT 29.2* 01/13/2014   PLT 171 01/16/2014   PLT 282 01/13/2014   LYMPHOPCT 6* 01/14/2014   LYMPHOPCT 8.7* 01/13/2014   MONOPCT 7 01/14/2014   MONOPCT 6.6 01/13/2014   EOSPCT 2 01/14/2014   EOSPCT 2.1 01/13/2014   BASOPCT 0 01/14/2014   BASOPCT 0.6 01/13/2014   CMP: Lab Results  Component Value Date   NA 141 01/16/2014   NA 139 01/13/2014   K 3.5*  01/16/2014   K 4.1 01/13/2014   CL 106 01/16/2014   CO2 24 01/16/2014   CO2 25 01/13/2014   BUN 7 01/16/2014   BUN 19.6 01/13/2014   CREATININE 0.63 01/16/2014   CREATININE 0.8 01/13/2014   CREATININE 0.83 09/07/2013   PROT 6.7 01/13/2014   PROT 7.0 11/14/2013   ALBUMIN 3.0* 01/13/2014   ALBUMIN 2.7* 11/14/2013   BILITOT 0.87 01/13/2014   BILITOT 1.4* 11/14/2013   ALKPHOS 70 01/13/2014   ALKPHOS 66 11/14/2013   AST 11 01/13/2014   AST 12 11/14/2013   ALT 10 01/13/2014   ALT 15 11/14/2013  .  Micro Results Recent Results (from the past 240 hour(s))  MRSA PCR SCREENING     Status: None   Collection Time    01/14/14  5:48 PM      Result Value Ref Range Status   MRSA by PCR NEGATIVE  NEGATIVE Final   Comment:            The GeneXpert MRSA Assay (FDA     approved for NASAL specimens     only), is  one component of a     comprehensive MRSA colonization     surveillance program. It is not     intended to diagnose MRSA     infection nor to guide or     monitor treatment for     MRSA infections.     Discharge Instructions     Please follow up with your PCP in 5 days from discharge Follow-up Information   Follow up with [name], MD In 5 days.   Specialty:  Family Medicine   Contact information:   [place] [address] [phone]       Discharge Medications     Medication List    STOP taking these medications       meloxicam 15 MG tablet  Commonly known as:  MOBIC      TAKE these medications       bisacodyl 10 MG suppository  Commonly known as:  DULCOLAX  Place 10 mg rectally daily as needed for moderate constipation.     calcium carbonate 1250 MG chewable tablet  Commonly known as:  OS-CAL  Chew 4 tablets by mouth daily.     codeine 30 MG tablet  Take 1 tablet (30 mg total) by mouth every 4 (four) hours as needed (cough).     docusate sodium 100 MG capsule  Commonly known as:  COLACE  Take 100 mg by mouth 2 (two) times daily.      enoxaparin 100 MG/ML injection  Commonly known as:  LOVENOX  Inject 0.9 mLs (90 mg total) into the skin daily.  Start taking on:  01/17/2014     esomeprazole 40 MG capsule  Commonly known as:  NEXIUM  Take 1 capsule (40 mg total) by mouth 2 (two) times daily before a meal.     gabapentin 600 MG tablet  Commonly known as:  NEURONTIN  Take 600 mg by mouth 3 (three) times daily.     glycerin adult 2 G Supp  Place 1 suppository rectally once as needed for moderate constipation.     levofloxacin 500 MG tablet  Commonly known as:  LEVAQUIN  Take 1 tablet (500 mg total) by mouth daily.     loratadine 10 MG tablet  Commonly known as:  CLARITIN  Take 1 tablet (10 mg total) by mouth daily.     methylPREDNIsolone 4 MG tablet  Commonly known as:  MEDROL DOSPACK  follow package directions     polyethylene glycol packet  Commonly known as:  MIRALAX / GLYCOLAX  Take 17 g by mouth daily as needed for moderate constipation.     polyvinyl alcohol 1.4 % ophthalmic solution  Commonly known as:  LIQUIFILM TEARS  Place 1 drop into both eyes as needed for dry eyes.     prochlorperazine 10 MG tablet  Commonly known as:  COMPAZINE  Take 1 tablet (10 mg total) by mouth every 6 (six) hours as needed for nausea or vomiting.     senna 8.6 MG tablet  Commonly known as:  SENOKOT  Take 2 tablets by mouth 2 (two) times daily.         Total Time in preparing paper work, data evaluation and todays exam - 35 minutes  Janique Hoefer M.D on 01/16/2014 at 12:44 PM  [place]  [phone]

## 2014-01-16 NOTE — Progress Notes (Signed)
DIAGNOSIS: stage IV (T2a, N3, M1b) non-small cell lung cancer, adenocarcinoma presented with large left lower lobe lung mass in addition to bilateral pulmonary nodules and mediastinal and supraclavicular lymphadenopathy diagnosed in May of 2015.   Genomic Alterations Identified?  ERBB3 amplification  CDK4 amplification  IDH2 R172S  KRAS G12D  MDM2 amplification  RBM10 [id number]*36  TERC amplification - equivocal?  Additional Disease-relevant Genes with No Reportable  Alterations Identified?  RET  ALK  BRAF  ERBB2  MET  EGFR   PRIOR THERAPY:  1) status post palliative radiotherapy to the left hip between 06/12 to 09/30/2013 at [facility].  status post stereotactic radiotherapy to brain lesions under the care of Dr. Kilpatrick at [facility] on 11/17/2013. 2) Systemic chemotherapy with carboplatin for AUC of 5, Alimta 500 mg/M2 and Avastin 15 mg/KG every 3 weeks, status post 5 cycles, discontinued secondary to disease progression. The first 4 cycles of her treatments were given at [facility] under the care of Dr. Dunphy.   CURRENT THERAPY:  1) immunotherapy with Nivolumab 3 MG/KG every 2 weeks. Status post 1 cycle. 2) Xgeva 120 mcg subcutaneously every 2 months.   Subjective: The patient is seen and examined today. Her husband was at the bedside. She has metastatic non-small cell lung cancer, adenocarcinoma status post 5 cycles of systemic chemotherapy was carboplatin and Alimta discontinued secondary to disease progression. The patient started last week the first dose of immunotherapy with Nivolumab status post 1 cycle. Interval after her treatment she noticed substernal chest pain with worsening dyspnea. She presented to the emergency department and CT angiogram of the chest showed a large central filling defects in the branches of the right lower lobe pulmonary artery consistent with acute pulmonary embolism. There was evidence of  right ventricular strain. Her boost scan of the chest 2 weeks ago was negative for pulmonary embolism. The patient was started on IV heparin and she is feeling much better today. She continues to have the baseline shortness of breath but improved compared to yesterday. She denied having any fever or chills, no nausea or vomiting.  Objective: Vital signs in last 24 hours: Temp:  [98.1 F (36.7 C)-99.1 F (37.3 C)] 98.1 F (36.7 C) (10/04 0000) Pulse Rate:  [85-105] 94 (10/04 0800) Resp:  [13-28] 25 (10/04 0800) BP: (115-130)/(41-67) 115/54 mmHg (10/04 0800) SpO2:  [94 %-98 %] 95 % (10/04 0800)  Intake/Output from previous day: 10/03 0701 - 10/04 0700 In: 1342.5 [I.V.:1242.5; IV Piggyback:100] Out: 1625 [Urine:1625] Intake/Output this shift: Total I/O In: 170 [P.O.:120; I.V.:50] Out: 500 [Urine:500]  General appearance: alert, cooperative, fatigued and no distress Resp: diminished breath sounds bilaterally Cardio: regular rate and rhythm, S1, S2 normal, no murmur, click, rub or gallop GI: soft, non-tender; bowel sounds normal; no masses,  no organomegaly Extremities: extremities normal, atraumatic, no cyanosis or edema  Lab Results:   Recent Labs  01/15/14 0540 01/16/14 0530  WBC 9.9 6.2  HGB 8.4* 8.1*  HCT 25.8* 24.4*  PLT 193 171   BMET  Recent Labs  01/15/14 0540 01/16/14 0530  NA 138 141  K 3.9 3.5*  CL 102 106  CO2 24 24  GLUCOSE 118* 100*  BUN 13 7  CREATININE 0.82 0.63  CALCIUM 8.6 8.0*    Studies/Results: Dg Chest 2 View  01/14/2014   CLINICAL DATA:  62-year-old female with chest pain. Status post initiation of chemotherapy.  EXAM: CHEST - 2 VIEW  COMPARISON:  Multiple prior.   Most recent CT chest 11/22/2013. Most recent x-ray 11/14/2013.  FINDINGS: Cardiomediastinal silhouette within normal limits in size and contour.  Similar size number and distribution of bilateral pulmonary nodules/masses. Again dominant mass within the left lower lobe.  No confluent  airspace disease. No pleural effusion. No pneumothorax.  A interval placement of double-lumen right-sided port catheter in the right internal jugular vein with the tip appearing to terminate in the right atrium.  Re- demonstration of fracture of the right ninth rib.  IMPRESSION: No radiographic evidence of acute cardiopulmonary disease.  Similar size, number, and distribution of multiple bilateral pulmonary nodules/ masses as compared to prior plain film and cross-sectional imaging.  Re- demonstration of right ninth rib fracture without significant callus formation.  Interval placement of double-lumen port catheter on the right chest wall, with the tip appearing to terminate in the right atrium.  Signed,  Jaime S. Wagner, DO  [facility]  [facility]   Electronically Signed   By: Jaime  Wagner D.O.   On: 01/14/2014 15:22   Ct Angio Chest Pe W/cm &/or Wo Cm  01/14/2014   CLINICAL DATA:  Chest pain; history of lung malignancy currently undergoing chemotherapy ; history of sarcoidosis and remote history of tobacco use ; history of right ninth rib fracture  EXAM: CT ANGIOGRAPHY CHEST WITH CONTRAST  TECHNIQUE: Multidetector CT imaging of the chest was performed using the standard protocol during bolus administration of intravenous contrast. Multiplanar CT image reconstructions and MIPs were obtained to evaluate the vascular anatomy.  CONTRAST:  100mL OMNIPAQUE IOHEXOL 300 MG/ML  SOLN intravenously  COMPARISON:  PA and lateral chest x-ray of today's date  FINDINGS: There is bulky diffuse mediastinal lymphadenopathy. There are enlarged hilar lymph nodes greater on the left than on the right. There are large filling defects within branches of the right lower lobe pulmonary artery. Definite filling defects elsewhere in the pulmonary artery tree are not demonstrated. The RV -LV ratio is 1.35. The heart overall is not enlarged. There is no pericardial effusion. There is a right  internal jugular power port catheter in place. The thoracic aorta is unremarkable. The thyroid gland is unremarkable. The thoracic esophagus exhibits no acute abnormalities.  There are multiple bilateral soft tissue density parenchymal masses of varying sizes. The largest measures 3.3 cm in diameter. There is no alveolar pneumonia. There is no evidence of pulmonary infarction.  Within the upper abdomen there are abnormal hypodensities posteriorly in the right hepatic lobe. The partially imaged adrenal glands are normal. The spleen exhibits no acute abnormality.  Review of the MIP images confirms the above findings.  IMPRESSION: 1. There are large central filling defects in branches of the right lower lobe pulmonary artery consistent with acute pulmonary embolism. There is evidence of right ventricular strain. Positive for acute PE with CT evidence of right heartstrain (RV/LV Ratio = 1.35) consistent with at least submassive (intermediate risk)PE. The presence of right heart strain has been associated with anincreased risk of morbidity and mortality. Consultation with Pulmonaryand Critical Care Medicine is recommended. 2. There is bulky mediastinal and left hilar lymphadenopathy. There are numerous soft tissue density masses throughout both lungs consistent with intrapulmonary metastatic disease. 3. There is are hypodense masses in the liver worrisome for metastatic disease. 4. Critical Value/emergent results were called by telephone at the time of interpretation on 01/14/2014 at 4:35 pm to Dr. Gentry, who verbally acknowledged these results.   Electronically Signed   By: David  Jordan   On:   01/14/2014 16:43    Medications: I have reviewed the patient's current medications.  Assessment/Plan: 1) metastatic non-small cell lung cancer, adenocarcinoma status post induction chemotherapy was carboplatin and Alimta discontinued secondary to disease progression. The patient started second line immunotherapy with  Nivolumab status post 1 cycle. I doubt the recent diagnosis of pulmonary embolism has any thing to do with her recent treatment with Nivolumab. She will continue her treatment with Nivolumab an outpatient basis as scheduled. 2) acute pulmonary embolism: Currently on IV heparin infusion. I discussed with the patient the nature treatment options for the pulmonary embolism including subcutaneous Lovenox which would be the preferred option in addition with malignancy versus treatment with oral Xarelto this is an insurance issues with Lovenox coverage or if it becomes inconvenient for the patient. She is willing to try Lovenox. She can be treated with a once daily dose 1.5 mg/KG.   Thank you for taking good care of Ms. fields, I will continue to follow up the patient with you and assist in her management an as-needed basis.  LOS: 2 days    , K. 01/16/2014    

## 2014-01-16 NOTE — Progress Notes (Signed)
ANTICOAGULATION CONSULT NOTE - Initial Consult  Pharmacy Consult for Lovenox Indication: pulmonary embolus  Allergies  Allergen Reactions  . Other     Opiates cause severe constipation. (to the point of being hospitalized)  . Ketoconazole Hives  . Lorazepam Hives    Broke out in hives at [facility]   . Morphine And Related Other (See Comments)    "Constipation to point of being hospitalized"  . Nystatin Hives  . Sporanox [Itraconazole] Hives  . Tylenol [Acetaminophen] Other (See Comments)    Rosanna Randy Syndrome   . Vitamin D Other (See Comments)    Gilbert's syndrome, elevated liver enzymes  . Zofran [Ondansetron Hcl] Hives    Hives at [facility]    Patient Measurements: Height: 5\' 3"  (160 cm) Weight: 131 lb 13.4 oz (59.8 kg) IBW/kg (Calculated) : 52.4  Vital Signs: Temp: 98.1 F (36.7 C) (10/04 0000) Temp Source: Oral (10/04 0000) BP: 115/54 mmHg (10/04 0800) Pulse Rate: 94 (10/04 0800)  Labs:  Recent Labs  01/14/14 1443 01/14/14 1700 01/14/14 1748 01/15/14 0001 01/15/14 0540 01/15/14 0808 01/16/14 0530  HGB 9.3*  --   --   --  8.4*  --  8.1*  HCT 28.3*  --   --   --  25.8*  --  24.4*  PLT 232  --   --   --  193  --  171  APTT  --  36  --   --   --   --   --   LABPROT  --  15.8*  --   --   --   --   --   INR  --  1.25  --   --   --   --   --   HEPARINUNFRC  --   --   --  0.55  --  0.41  --   CREATININE 0.70  --   --   --  0.82  --  0.63  TROPONINI <0.30  --  <0.30 <0.30 <0.30  --   --    Estimated Creatinine Clearance: 61.1 ml/min (by C-G formula based on Cr of 0.63).  Medical History: Past Medical History  Diagnosis Date  . Arthritis   . Bilateral ovarian cysts   . GERD (gastroesophageal reflux disease)   . Strain of hip flexor 06/2013    left side torn  . Cancer     lung ca   Assessment: 49 yoF with history of lung cancer presents with chest pain and dyspnea.  CT scan resulted with signs of PE and right heart strain. 2D echo: EF 60-65%.  Pharmacy consulted to  manage IV heparin. CBC: Hgb 9.6-> 8.1 (appears near baseline from last month), platelets 282-> 171 Renal: SCr wnl  Significant events: 10/2: Heparin bolus 3000 units, infusion started at 1000 units/hr at 1800 10/3: first heparin level in range (0.55 units/ml), this am heparin level 0.41 on 1000 units/hr. Orders received to change to Lovenox per Pharmacy protocol. 10/4: Lovenox changed to dose of 1.5mg /kg q24 hr  Goal of Therapy:  Anti-Xa levels 0.6-1 units/ml 4hr after dose if necessary Monitor platelets by anticoagulation protocol: Yes   Plan:   Change Lovenox to 90mg  SQ q24 hr today  Daily CBC  Minda Ditto PharmD Pager [phone] 01/16/2014, 10:25 AM

## 2014-01-16 NOTE — Discharge Instructions (Signed)
Follow with Primary MD [name], MD in 5 days   Get CBC, CMP, 2 view Chest X ray checked  by Primary MD next visit.    Activity: As tolerated with Full fall precautions use walker/cane & assistance as needed   Disposition Home    Diet: Heart Healthy  , with feeding assistance and aspiration precautions as needed.  For Heart failure patients - Check your Weight same time everyday, if you gain over 2 pounds, or you develop in leg swelling, experience more shortness of breath or chest pain, call your Primary MD immediately. Follow Cardiac Low Salt Diet and 1.8 lit/day fluid restriction.   On your next visit with her primary care physician please Get Medicines reviewed and adjusted.  Please request your Prim.MD to go over all Hospital Tests and Procedure/Radiological results at the follow up, please get all Hospital records sent to your Prim MD by signing hospital release before you go home.   If you experience worsening of your admission symptoms, develop shortness of breath, life threatening emergency, suicidal or homicidal thoughts you must seek medical attention immediately by calling 911 or calling your MD immediately  if symptoms less severe.  You Must read complete instructions/literature along with all the possible adverse reactions/side effects for all the Medicines you take and that have been prescribed to you. Take any new Medicines after you have completely understood and accpet all the possible adverse reactions/side effects.   Do not drive, operating heavy machinery, perform activities at heights, swimming or participation in water activities or provide baby sitting services if your were admitted for syncope or siezures until you have seen by Primary MD or a Neurologist and advised to do so again.  Do not drive when taking Pain medications.    Do not take more than prescribed Pain, Sleep and Anxiety Medications  Special Instructions: If you have smoked or chewed  Tobacco  in the last 2 yrs please stop smoking, stop any regular Alcohol  and or any Recreational drug use.  Wear Seat belts while driving.   Please note  You were cared for by a hospitalist during your hospital stay. If you have any questions about your discharge medications or the care you received while you were in the hospital after you are discharged, you can call the unit and asked to speak with the hospitalist on call if the hospitalist that took care of you is not available. Once you are discharged, your primary care physician will handle any further medical issues. Please note that NO REFILLS for any discharge medications will be authorized once you are discharged, as it is imperative that you return to your primary care physician (or establish a relationship with a primary care physician if you do not have one) for your aftercare needs so that they can reassess your need for medications and monitor your lab values.

## 2014-01-17 ENCOUNTER — Telehealth: Payer: Self-pay | Admitting: Medical Oncology

## 2014-01-17 ENCOUNTER — Encounter: Payer: Self-pay | Admitting: Nurse Practitioner

## 2014-01-17 DIAGNOSIS — C7951 Secondary malignant neoplasm of bone: Secondary | ICD-10-CM | POA: Insufficient documentation

## 2014-01-17 NOTE — Assessment & Plan Note (Signed)
Patient was switched from carboplatin/Alimta/Avastin chemotherapy regimen to Nivolumab immunotherapy due to progression.  She received cycle one of the Nivolumab on 01/13/2014.  This will continue to be cycled on an every 14 day basis.

## 2014-01-17 NOTE — Telephone Encounter (Signed)
Need Mohamed approval for Dr Jacelyn Grip to do a facet injection with a steroid for her pain. Note to [place].

## 2014-01-17 NOTE — Assessment & Plan Note (Signed)
Patient continues to receive Xgeva for her previously diagnosed bone metastasis.  She last received Xgeva on 12/17/2013.  She will continue the [facility] on an every two-month basis.  Next Delton See will be due on 02/16/2014.

## 2014-01-17 NOTE — Progress Notes (Signed)
[place]   Chief Complaint  Patient presents with  . Chest Pain    HPI: Sherri Stafford 63 y.o. female diagnosed with lung cancer.  Patient is status post carboplatin/Alimta/Avastin chemotherapy regimen.  Currently undergoing Nivolumab immunotherapy.  Patient was received Xgeva for her previously diagnosed bone metastasis on an every two-month basis.  Patient called the cancer Center today requested urgent care visit.  She states she has been experiencing intermittent chest pain/heaviness; and occasional shortness of breath of for this past 1-2 days.  She denies any pain with inspiration.  She denies any nausea, vomiting, or diarrhea.  She denies any recent fevers or chills.   Chest Pain     CURRENT THERAPY: No active treatment plan for patient.   Review of Systems  Cardiovascular: Positive for chest pain.    Past Medical History  Diagnosis Date  . Arthritis   . Bilateral ovarian cysts   . GERD (gastroesophageal reflux disease)   . Strain of hip flexor 06/2013    left side torn  . Cancer     lung ca    Past Surgical History  Procedure Laterality Date  . Dilation and curettage of uterus  2004    x 3   . Tubal ligation    . Colonoscopy  04/05/2004    normal     has Nonspecific abnormal results of liver function study; Cough; Sarcoidosis; Unspecified vitamin D deficiency; Dyspnea; Adenocarcinoma of lung; Chest pain; Pulmonary embolism; Acute bronchitis; and Bone metastases on her problem list.     is allergic to other; ketoconazole; lorazepam; morphine and related; nystatin; sporanox; tylenol; vitamin d; and zofran.    Medication List       This list is accurate as of: 01/14/14  2:17 PM.  Always use your most recent med list.               bisacodyl 10 MG suppository  Commonly known as:  DULCOLAX  Place 10 mg rectally daily as needed for moderate constipation.     calcium carbonate 1250 MG chewable tablet  Commonly known as:  OS-CAL  Chew 4  tablets by mouth daily.     codeine 30 MG tablet  Take 1 tablet (30 mg total) by mouth every 4 (four) hours as needed (cough).     docusate sodium 100 MG capsule  Commonly known as:  COLACE  Take 100 mg by mouth 2 (two) times daily.     enoxaparin 100 MG/ML injection  Commonly known as:  LOVENOX  Inject 0.9 mLs (90 mg total) into the skin daily.     esomeprazole 40 MG capsule  Commonly known as:  NEXIUM  Take 20 mg by mouth daily as needed (for acid reflex).     esomeprazole 40 MG capsule  Commonly known as:  NEXIUM  Take 1 capsule (40 mg total) by mouth 2 (two) times daily before a meal.     estradiol-norethindrone 1-0.5 MG per tablet  Commonly known as:  ACTIVELLA  Take 1 tablet by mouth daily.     gabapentin 300 MG capsule  Commonly known as:  NEURONTIN  Take 600 mg by mouth daily.     glycerin adult 2 G Supp  Commonly known as:  glycerin adult  Place 1 suppository rectally once.     glycerin adult 2 G Supp  Place 1 suppository rectally once as needed for moderate constipation.     levofloxacin 500 MG tablet  Commonly known as:  LEVAQUIN  Take 1 tablet (500 mg total) by mouth daily.     loratadine 10 MG tablet  Commonly known as:  CLARITIN  Take 1 tablet (10 mg total) by mouth daily.     methylPREDNIsolone 4 MG tablet  Commonly known as:  MEDROL DOSPACK  follow package directions     naproxen sodium 220 MG tablet  Commonly known as:  ANAPROX  Take 220 mg by mouth daily as needed.     OLANZapine 10 MG tablet  Commonly known as:  ZYPREXA  Take 1 tablet (10 mg total) by mouth at bedtime.     OLANZapine 10 MG tablet  Commonly known as:  ZYPREXA  Take 1 tablet (10 mg total) by mouth at bedtime.     OXYCONTIN 10 mg T12a 12 hr tablet  Generic drug:  OxyCODONE  Take 10 mg by mouth 3 (three) times daily.     oxyCODONE 5 MG immediate release tablet  Commonly known as:  Oxy IR/ROXICODONE  Take 5 mg by mouth 2 (two) times daily as needed for severe pain.      polyethylene glycol packet  Commonly known as:  MIRALAX  Take 17 g by mouth 2 (two) times daily.     polyethylene glycol packet  Commonly known as:  MIRALAX / GLYCOLAX  Take 17 g by mouth daily as needed for moderate constipation.     polyvinyl alcohol 1.4 % ophthalmic solution  Commonly known as:  LIQUIFILM TEARS  Place 1 drop into both eyes as needed for dry eyes.     prochlorperazine 10 MG tablet  Commonly known as:  COMPAZINE  Take 1 tablet (10 mg total) by mouth every 6 (six) hours as needed for nausea or vomiting.     senna 8.6 MG tablet  Commonly known as:  SENOKOT  Take 2 tablets by mouth 2 (two) times daily.         PHYSICAL EXAMINATION  Blood pressure 110/67, pulse 122, temperature 98 F (36.7 C), temperature source Oral, resp. rate 18, height 5\' 3"  (1.6 m), weight 131 lb 4.8 oz (59.557 kg), last menstrual period 01/14/2008, SpO2 99.00%.  Physical Exam  Nursing note and vitals reviewed. Constitutional: She is oriented to person, place, and time and well-developed, well-nourished, and in no distress.  HENT:  Head: Normocephalic and atraumatic.  Mouth/Throat: Oropharynx is clear and moist.  Eyes: Conjunctivae and EOM are normal. Pupils are equal, round, and reactive to light. No scleral icterus.  Neck: Normal range of motion. Neck supple. No JVD present. No tracheal deviation present. No thyromegaly present.  Cardiovascular: Regular rhythm, normal heart sounds and intact distal pulses.  Exam reveals no friction rub.   No murmur heard. Tachycardic rate  Pulmonary/Chest: Effort normal and breath sounds normal. No respiratory distress. She has no wheezes.  Abdominal: Soft. Bowel sounds are normal. She exhibits no distension. There is no tenderness. There is no rebound.  Musculoskeletal: Normal range of motion. She exhibits no edema and no tenderness.  Lymphadenopathy:    She has no cervical adenopathy.  Neurological: She is alert and oriented to person, place, and  time. Gait normal.  Skin: Skin is warm and dry. No rash noted. No erythema.  Psychiatric: Affect normal.   EKG: V6 V1 Sherri Stafford, Sherri Stafford JM:[id number] 14-Jan-2014 14:22:45 [facility] ED ROUTINE RECORD Sinus tachycardia RSR' in V1 or V2, right VCD or RVH Confirmed by ZAVITZ MD, JOSHUA ([id number]) on 01/14/2014 2:34:34 PM 31mm/s 94mm/mV 150Hz  8.0 SP2 CID: [id number]  Referred by: Confirmed By: Elnora Morrison MD Vent. rate 106 BPM PR interval 119 ms QRS duration 84 ms QT/QTc 303/402 ms P-R-T axes 69 53 73 Jul 10, 1950 (62 yr) Female Caucasian 0in 131lb Room:[facility] Loc:501 Technician:AMANDA RHONEY Test ind: Page 1 of 1 [id number] EDT: 14:34 02-O  ASSESSMENT/PLAN:    Adenocarcinoma of lung  Assessment & Plan Patient was switched from carboplatin/Alimta/Avastin chemotherapy regimen to Nivolumab immunotherapy due to progression.  She received cycle one of the Nivolumab on 01/13/2014.  This will continue to be cycled on an every 14 day basis.   Chest pain  Assessment & Plan Patient is experiencing some symptomatic chest pain with some occasional shortness of breath.  She denies any specific pain with inspiration.  EKG obtained while at the cancer center reveals a sinus tachycardia with a rate of 111.  Probable right atrial enlargement noted.  QTC is 408.  Questionable bundle branch block noted on EKG; and also questionable ST elevation.  Patient appeared in no acute distress.  Patient was transported to the emergency department for further evaluation.  This provider did call a brief history to the emergency department charge nurse as well.   Bone metastases  Assessment & Plan Patient continues to receive Xgeva for her previously diagnosed bone metastasis.  She last received Xgeva on 12/17/2013.  She will continue the [facility] on an every two-month basis.  Next Delton See will be due on 02/16/2014.   Patient stated understanding of all instructions; and was in agreement with this plan of care.  The patient knows to call the clinic with any problems, questions or concerns.   Review/collaboration with Dr. Julien Nordmann regarding all aspects of patient's visit today.   Total time spent with patient was 25 minutes;  with greater than 80 percent of that time spent in face to face counseling regarding her symptoms, communication with the emergency department, and coordination of care and follow up.  Disclaimer: This note was dictated with voice recognition software. Similar sounding words can inadvertently be transcribed and may not be corrected upon review.   Drue Second, NP 01/17/2014

## 2014-01-17 NOTE — Assessment & Plan Note (Signed)
Patient is experiencing some symptomatic chest pain with some occasional shortness of breath.  She denies any specific pain with inspiration.  EKG obtained while at the cancer center reveals a sinus tachycardia with a rate of 111.  Probable right atrial enlargement noted.  QTC is 408.  Questionable bundle branch block noted on EKG; and also questionable ST elevation.  Patient appeared in no acute distress.  Patient was transported to the emergency department for further evaluation.  This provider did call a brief history to the emergency department charge nurse as well.

## 2014-01-18 ENCOUNTER — Telehealth: Payer: Self-pay | Admitting: Medical Oncology

## 2014-01-18 NOTE — Telephone Encounter (Signed)
Message copied by Ardeen Garland on Tue Jan 18, 2014 12:05 PM ------      Message from: Curt Bears      Created: Mon Jan 17, 2014 10:32 PM       OK.      ----- Message -----         From: Ardeen Garland, RN         Sent: 01/17/2014   3:11 PM           To: Curt Bears, MD            Dr Morey Hummingbird wants your approval  to do a facet injection with a steroid for her pain             ------

## 2014-01-18 NOTE — Telephone Encounter (Signed)
Note faxed to [facility] orthopedics

## 2014-01-20 ENCOUNTER — Telehealth: Payer: Self-pay | Admitting: *Deleted

## 2014-01-20 DIAGNOSIS — C3490 Malignant neoplasm of unspecified part of unspecified bronchus or lung: Secondary | ICD-10-CM

## 2014-01-20 NOTE — Telephone Encounter (Signed)
Pt called stating she has been very lethargic and wants to know if she needs to have a blood transfusion.  Hbg 8.1 as of labs 10/4.  Per Dr Vista Mink, no blood at this time, will continue to monitor and it should start to recover on its own.  Encouraged iron rich foods.  Pt verbalized understanding.   Pt also asked that we call [facility] to have her PT continue to come to her home, she states it has really been beneficial.  Called and left a msg with Lurlean Leyden with [facility] to ask if we need to send it more orders for PT to continue to come to her home.

## 2014-01-20 NOTE — Telephone Encounter (Signed)
Pt called stating that on her right thigh she is experiencing intermittent pains that change locations frequently.  They do not occur in one location specifically.  No swelling, tenderness to the touch or redness.  Advised she continue to monitor closely and if anything changes to let us know.  Otherwise just continue to monitor.  She also states she is still coughing.  Dr Vista Mink aware, no further orders at this time.

## 2014-01-20 NOTE — Addendum Note (Signed)
Addended by: Simon Rhein L on: 01/20/2014 04:07 PM   Modules accepted: Orders

## 2014-01-20 NOTE — Telephone Encounter (Signed)
Heard back from [facility], order for PT needs to be put in [facility]. Order placed.

## 2014-01-26 ENCOUNTER — Telehealth: Payer: Self-pay | Admitting: Nurse Practitioner

## 2014-01-26 ENCOUNTER — Ambulatory Visit (HOSPITAL_COMMUNITY)
Admission: RE | Admit: 2014-01-26 | Discharge: 2014-01-26 | Disposition: A | Discharge status: Home / Self Care | Payer: BC Managed Care – PPO | Source: Ambulatory Visit | Attending: Nurse Practitioner | Admitting: Nurse Practitioner

## 2014-01-26 ENCOUNTER — Ambulatory Visit: Payer: BC Managed Care – PPO

## 2014-01-26 ENCOUNTER — Other Ambulatory Visit: Payer: Self-pay | Admitting: Nurse Practitioner

## 2014-01-26 ENCOUNTER — Ambulatory Visit (HOSPITAL_BASED_OUTPATIENT_CLINIC_OR_DEPARTMENT_OTHER): Payer: BC Managed Care – PPO | Admitting: Nurse Practitioner

## 2014-01-26 ENCOUNTER — Other Ambulatory Visit: Payer: Self-pay | Admitting: Medical Oncology

## 2014-01-26 ENCOUNTER — Ambulatory Visit (HOSPITAL_BASED_OUTPATIENT_CLINIC_OR_DEPARTMENT_OTHER): Payer: BC Managed Care – PPO

## 2014-01-26 VITALS — BP 136/60 | HR 106 | Temp 97.5°F | Resp 18 | Ht 63.0 in | Wt 131.1 lb

## 2014-01-26 DIAGNOSIS — E860 Dehydration: Secondary | ICD-10-CM

## 2014-01-26 DIAGNOSIS — R109 Unspecified abdominal pain: Secondary | ICD-10-CM | POA: Diagnosis not present

## 2014-01-26 DIAGNOSIS — G893 Neoplasm related pain (acute) (chronic): Secondary | ICD-10-CM

## 2014-01-26 DIAGNOSIS — K5900 Constipation, unspecified: Secondary | ICD-10-CM | POA: Insufficient documentation

## 2014-01-26 DIAGNOSIS — C3490 Malignant neoplasm of unspecified part of unspecified bronchus or lung: Secondary | ICD-10-CM

## 2014-01-26 DIAGNOSIS — R112 Nausea with vomiting, unspecified: Secondary | ICD-10-CM | POA: Diagnosis not present

## 2014-01-26 DIAGNOSIS — R945 Abnormal results of liver function studies: Secondary | ICD-10-CM

## 2014-01-26 DIAGNOSIS — C3432 Malignant neoplasm of lower lobe, left bronchus or lung: Secondary | ICD-10-CM

## 2014-01-26 DIAGNOSIS — E8809 Other disorders of plasma-protein metabolism, not elsewhere classified: Secondary | ICD-10-CM

## 2014-01-26 DIAGNOSIS — R110 Nausea: Secondary | ICD-10-CM

## 2014-01-26 DIAGNOSIS — Z7901 Long term (current) use of anticoagulants: Secondary | ICD-10-CM

## 2014-01-26 DIAGNOSIS — C7951 Secondary malignant neoplasm of bone: Secondary | ICD-10-CM

## 2014-01-26 DIAGNOSIS — Z95828 Presence of other vascular implants and grafts: Secondary | ICD-10-CM

## 2014-01-26 DIAGNOSIS — R300 Dysuria: Secondary | ICD-10-CM

## 2014-01-26 LAB — URINALYSIS, MICROSCOPIC - CHCC
BLOOD: NEGATIVE
Bilirubin (Urine): NEGATIVE
GLUCOSE UR CHCC: NEGATIVE mg/dL
Ketones: 15 mg/dL
Leukocyte Esterase: NEGATIVE
NITRITE: NEGATIVE
Protein: 100 mg/dL
RBC / HPF: NEGATIVE (ref 0–2)
SPECIFIC GRAVITY, URINE: 1.02 (ref 1.003–1.035)
UROBILINOGEN UR: 0.2 mg/dL (ref 0.2–1)
pH: 6.5 (ref 4.6–8.0)

## 2014-01-26 LAB — COMPREHENSIVE METABOLIC PANEL (CC13)
ALBUMIN: 3 g/dL — AB (ref 3.5–5.0)
ALT: 12 U/L (ref 0–55)
ANION GAP: 13 meq/L — AB (ref 3–11)
AST: 12 U/L (ref 5–34)
Alkaline Phosphatase: 68 U/L (ref 40–150)
BUN: 12.1 mg/dL (ref 7.0–26.0)
CHLORIDE: 103 meq/L (ref 98–109)
CO2: 23 mEq/L (ref 22–29)
CREATININE: 0.9 mg/dL (ref 0.6–1.1)
Calcium: 9.8 mg/dL (ref 8.4–10.4)
GLUCOSE: 123 mg/dL (ref 70–140)
POTASSIUM: 4.5 meq/L (ref 3.5–5.1)
Sodium: 139 mEq/L (ref 136–145)
Total Bilirubin: 1.51 mg/dL — ABNORMAL HIGH (ref 0.20–1.20)
Total Protein: 7.1 g/dL (ref 6.4–8.3)

## 2014-01-26 LAB — CBC WITH DIFFERENTIAL/PLATELET
BASO%: 0.9 % (ref 0.0–2.0)
BASOS ABS: 0.1 10*3/uL (ref 0.0–0.1)
EOS%: 7.2 % — ABNORMAL HIGH (ref 0.0–7.0)
Eosinophils Absolute: 0.7 10*3/uL — ABNORMAL HIGH (ref 0.0–0.5)
HEMATOCRIT: 30.4 % — AB (ref 34.8–46.6)
HEMOGLOBIN: 9.9 g/dL — AB (ref 11.6–15.9)
LYMPH#: 0.9 10*3/uL (ref 0.9–3.3)
LYMPH%: 9.2 % — ABNORMAL LOW (ref 14.0–49.7)
MCH: 29.3 pg (ref 25.1–34.0)
MCHC: 32.4 g/dL (ref 31.5–36.0)
MCV: 90.5 fL (ref 79.5–101.0)
MONO#: 0.8 10*3/uL (ref 0.1–0.9)
MONO%: 7.5 % (ref 0.0–14.0)
NEUT#: 7.5 10*3/uL — ABNORMAL HIGH (ref 1.5–6.5)
NEUT%: 75.2 % (ref 38.4–76.8)
Platelets: 307 10*3/uL (ref 145–400)
RBC: 3.36 10*6/uL — ABNORMAL LOW (ref 3.70–5.45)
RDW: 19.8 % — ABNORMAL HIGH (ref 11.2–14.5)
WBC: 10 10*3/uL (ref 3.9–10.3)

## 2014-01-26 LAB — HOLD TUBE, BLOOD BANK

## 2014-01-26 MED ORDER — HEPARIN SOD (PORK) LOCK FLUSH 100 UNIT/ML IV SOLN
500.0000 [IU] | Freq: Once | INTRAVENOUS | Status: AC
  Administered 2014-01-26: 500 [IU] via INTRAVENOUS
  Filled 2014-01-26: qty 5

## 2014-01-26 MED ORDER — HEPARIN SOD (PORK) LOCK FLUSH 100 UNIT/ML IV SOLN
500.0000 [IU] | Freq: Once | INTRAVENOUS | Status: DC
  Filled 2014-01-26: qty 5

## 2014-01-26 MED ORDER — PROCHLORPERAZINE EDISYLATE 5 MG/ML IJ SOLN
INTRAMUSCULAR | Status: AC
Start: 2014-01-26 — End: 2014-01-26
  Filled 2014-01-26: qty 2

## 2014-01-26 MED ORDER — PROCHLORPERAZINE EDISYLATE 5 MG/ML IJ SOLN
10.0000 mg | Freq: Once | INTRAMUSCULAR | Status: AC
  Administered 2014-01-26: 10 mg via INTRAVENOUS

## 2014-01-26 MED ORDER — SODIUM CHLORIDE 0.9 % IJ SOLN
10.0000 mL | INTRAMUSCULAR | Status: DC | PRN
Start: 2014-01-26 — End: 2014-01-26
  Filled 2014-01-26: qty 10

## 2014-01-26 MED ORDER — SODIUM CHLORIDE 0.9 % IJ SOLN
10.0000 mL | INTRAMUSCULAR | Status: DC | PRN
  Administered 2014-01-26: 10 mL via INTRAVENOUS
  Filled 2014-01-26: qty 10

## 2014-01-26 MED ORDER — SODIUM CHLORIDE 0.9 % IV SOLN
1000.0000 mL | Freq: Once | INTRAVENOUS | Status: AC
  Administered 2014-01-26: 12:00:00 via INTRAVENOUS

## 2014-01-26 MED ORDER — FENTANYL 25 MCG/HR TD PT72: 25.0000 ug | MEDICATED_PATCH | TRANSDERMAL | Status: DC

## 2014-01-26 NOTE — Telephone Encounter (Signed)
Patient left 2 voice messages requesting results from ultrasound and UA, both obtained today. Pt. saw Selena Lesser, NP today in symptom mgmt clinic. Requested Erasmo Downer, RN to [facility] to follow up.

## 2014-01-26 NOTE — Patient Instructions (Signed)

## 2014-01-26 NOTE — Telephone Encounter (Signed)
Rn returning call for patient's c/o constipation. Last [facility] Monday 01/24/14. Pt reports using suppository, 4 senna and colace, prune juice and milk of magnesia. RN recommended mag citrate but patient states "I think I'm just going to go to the ER." this RN tried to suggest otherwise, then patient began vomiting while on the phone with RN. Patient scheduled to see symptom mgmt Selena Lesser, NP and instructed to be driven to [facility] at this time. Patient verbalizes understanding and is on her way.  0943: Per Selena Lesser, NP patient to have KUB prior to exam. Patient informed and verbalizes understanding.

## 2014-01-26 NOTE — Progress Notes (Signed)
Patient sent to [facility] radiology for ultrasound after finishing IVF. Pt taken in wheelchair by her friend. Patient agreeable to come for treatment tomorrow at 10:30am. New schedule given to patient at discharge.

## 2014-01-26 NOTE — Patient Instructions (Signed)
[facility] will be contacting you regarding Home health evaluation and physical therapy.

## 2014-01-27 ENCOUNTER — Ambulatory Visit: Payer: BC Managed Care – PPO | Admitting: Internal Medicine

## 2014-01-27 ENCOUNTER — Telehealth: Payer: Self-pay | Admitting: *Deleted

## 2014-01-27 ENCOUNTER — Telehealth: Payer: Self-pay | Admitting: Nurse Practitioner

## 2014-01-27 ENCOUNTER — Other Ambulatory Visit: Payer: BC Managed Care – PPO

## 2014-01-27 ENCOUNTER — Telehealth: Payer: Self-pay | Admitting: Medical Oncology

## 2014-01-27 ENCOUNTER — Encounter: Payer: Self-pay | Admitting: Nurse Practitioner

## 2014-01-27 ENCOUNTER — Ambulatory Visit (HOSPITAL_BASED_OUTPATIENT_CLINIC_OR_DEPARTMENT_OTHER): Payer: BC Managed Care – PPO

## 2014-01-27 VITALS — BP 128/67 | HR 100 | Temp 98.1°F | Resp 18

## 2014-01-27 DIAGNOSIS — C3432 Malignant neoplasm of lower lobe, left bronchus or lung: Secondary | ICD-10-CM

## 2014-01-27 DIAGNOSIS — C3490 Malignant neoplasm of unspecified part of unspecified bronchus or lung: Secondary | ICD-10-CM

## 2014-01-27 DIAGNOSIS — R110 Nausea: Secondary | ICD-10-CM | POA: Insufficient documentation

## 2014-01-27 DIAGNOSIS — E860 Dehydration: Secondary | ICD-10-CM | POA: Insufficient documentation

## 2014-01-27 DIAGNOSIS — E8809 Other disorders of plasma-protein metabolism, not elsewhere classified: Secondary | ICD-10-CM | POA: Insufficient documentation

## 2014-01-27 DIAGNOSIS — R109 Unspecified abdominal pain: Secondary | ICD-10-CM | POA: Insufficient documentation

## 2014-01-27 DIAGNOSIS — Z5112 Encounter for antineoplastic immunotherapy: Secondary | ICD-10-CM

## 2014-01-27 DIAGNOSIS — Z7901 Long term (current) use of anticoagulants: Secondary | ICD-10-CM | POA: Insufficient documentation

## 2014-01-27 DIAGNOSIS — C7951 Secondary malignant neoplasm of bone: Secondary | ICD-10-CM

## 2014-01-27 DIAGNOSIS — R300 Dysuria: Secondary | ICD-10-CM | POA: Insufficient documentation

## 2014-01-27 LAB — URINE CULTURE

## 2014-01-27 MED ORDER — DIPHENHYDRAMINE HCL 50 MG/ML IJ SOLN: 25.0000 mg | Freq: Once | INTRAMUSCULAR | Status: DC | PRN

## 2014-01-27 MED ORDER — SODIUM CHLORIDE 0.9 % IV SOLN
Freq: Once | INTRAVENOUS | Status: AC
  Administered 2014-01-27: 11:00:00 via INTRAVENOUS

## 2014-01-27 MED ORDER — METHYLPREDNISOLONE SODIUM SUCC 125 MG IJ SOLR: 125.0000 mg | Freq: Once | INTRAMUSCULAR | Status: DC | PRN

## 2014-01-27 MED ORDER — HEPARIN SOD (PORK) LOCK FLUSH 100 UNIT/ML IV SOLN
500.0000 [IU] | Freq: Once | INTRAVENOUS | Status: AC | PRN
  Administered 2014-01-27: 500 [IU]
  Filled 2014-01-27: qty 5

## 2014-01-27 MED ORDER — SODIUM CHLORIDE 0.9 % IJ SOLN
10.0000 mL | INTRAMUSCULAR | Status: DC | PRN
  Administered 2014-01-27: 10 mL
  Filled 2014-01-27: qty 10

## 2014-01-27 MED ORDER — SODIUM CHLORIDE 0.9 % IV SOLN
3.0000 mg/kg | Freq: Once | INTRAVENOUS | Status: AC
  Administered 2014-01-27: 180 mg via INTRAVENOUS
  Filled 2014-01-27: qty 18

## 2014-01-27 MED ORDER — SODIUM CHLORIDE 0.9 % IV SOLN: Freq: Once | INTRAVENOUS | Status: DC | PRN

## 2014-01-27 MED ORDER — DIPHENHYDRAMINE HCL 50 MG/ML IJ SOLN: 50.0000 mg | Freq: Once | INTRAMUSCULAR | Status: DC | PRN

## 2014-01-27 MED ORDER — ALBUTEROL SULFATE (2.5 MG/3ML) 0.083% IN NEBU
2.5000 mg | INHALATION_SOLUTION | Freq: Once | RESPIRATORY_TRACT | Status: DC | PRN
  Filled 2014-01-27: qty 3

## 2014-01-27 MED ORDER — SODIUM CHLORIDE 0.9 % IV SOLN
1000.0000 mL | Freq: Once | INTRAVENOUS | Status: AC
  Administered 2014-01-27: 1000 mL via INTRAVENOUS

## 2014-01-27 MED ORDER — DENOSUMAB 120 MG/1.7ML ~~LOC~~ SOLN
120.0000 mg | Freq: Once | SUBCUTANEOUS | Status: DC
  Filled 2014-01-27: qty 1.7

## 2014-01-27 NOTE — Progress Notes (Signed)
Per Dr. Julien Nordmann okay to treat today with total Bili of 1.51 and HR of 113. Verbal order received to give patient 1 Liter of NS over two hours today as well.  Selena Lesser, NP at bedside and spoke with patient regarding [place] results from 10/14.

## 2014-01-27 NOTE — Telephone Encounter (Signed)
Per staff message and POF I have scheduled appts. Advised scheduler of appts. JMW  

## 2014-01-27 NOTE — Assessment & Plan Note (Signed)
Patient will receive Delton See for her previously diagnosed pulmonary is tomorrow 01/27/2014 as well.  This is cycled on an every 28 day basis.

## 2014-01-27 NOTE — Assessment & Plan Note (Signed)
Patient's albumin level remains low.  Patient was encouraged to push protein is much as possible in her diet.

## 2014-01-27 NOTE — Patient Instructions (Signed)
[facility] Discharge Instructions for Patients Receiving Chemotherapy  Today you received the following chemotherapy agents: Nivolumab.  To help prevent nausea and vomiting after your treatment, we encourage you to take your nausea medication as prescribed.   If you develop nausea and vomiting that is not controlled by your nausea medication, call the clinic.   BELOW ARE SYMPTOMS THAT SHOULD BE REPORTED IMMEDIATELY:  *FEVER GREATER THAN 100.5 F  *CHILLS WITH OR WITHOUT FEVER  NAUSEA AND VOMITING THAT IS NOT CONTROLLED WITH YOUR NAUSEA MEDICATION  *UNUSUAL SHORTNESS OF BREATH  *UNUSUAL BRUISING OR BLEEDING  TENDERNESS IN MOUTH AND THROAT WITH OR WITHOUT PRESENCE OF ULCERS  *URINARY PROBLEMS  *BOWEL PROBLEMS  UNUSUAL RASH Items with * indicate a potential emergency and should be followed up as soon as possible.  Feel free to call the clinic you have any questions or concerns. The clinic phone number is [phone].   Dehydration, Adult Dehydration is when you lose more fluids from the body than you take in. Vital organs like the kidneys, brain, and heart cannot function without a proper amount of fluids and salt. Any loss of fluids from the body can cause dehydration.  CAUSES   Vomiting.  Diarrhea.  Excessive sweating.  Excessive urine output.  Fever. SYMPTOMS  Mild dehydration  Thirst.  Dry lips.  Slightly dry mouth. Moderate dehydration  Very dry mouth.  Sunken eyes.  Skin does not bounce back quickly when lightly pinched and released.  Dark urine and decreased urine production.  Decreased tear production.  Headache. Severe dehydration  Very dry mouth.  Extreme thirst.  Rapid, weak pulse (more than 100 beats per minute at rest).  Cold hands and feet.  Not able to sweat in spite of heat and temperature.  Rapid breathing.  Blue lips.  Confusion and lethargy.  Difficulty being awakened.  Minimal urine  production.  No tears. DIAGNOSIS  Your caregiver will diagnose dehydration based on your symptoms and your exam. Blood and urine tests will help confirm the diagnosis. The diagnostic evaluation should also identify the cause of dehydration. TREATMENT  Treatment of mild or moderate dehydration can often be done at home by increasing the amount of fluids that you drink. It is best to drink small amounts of fluid more often. Drinking too much at one time can make vomiting worse. Refer to the home care instructions below. Severe dehydration needs to be treated at the hospital where you will probably be given intravenous (IV) fluids that contain water and electrolytes. HOME CARE INSTRUCTIONS   Ask your caregiver about specific rehydration instructions.  Drink enough fluids to keep your urine clear or pale yellow.  Drink small amounts frequently if you have nausea and vomiting.  Eat as you normally do.  Avoid:  Foods or drinks high in sugar.  Carbonated drinks.  Juice.  Extremely hot or cold fluids.  Drinks with caffeine.  Fatty, greasy foods.  Alcohol.  Tobacco.  Overeating.  Gelatin desserts.  Wash your hands well to avoid spreading bacteria and viruses.  Only take over-the-counter or prescription medicines for pain, discomfort, or fever as directed by your caregiver.  Ask your caregiver if you should continue all prescribed and over-the-counter medicines.  Keep all follow-up appointments with your caregiver. SEEK MEDICAL CARE IF:  You have abdominal pain and it increases or stays in one area (localizes).  You have a rash, stiff neck, or severe headache.  You are irritable, sleepy, or difficult to awaken.  You  are weak, dizzy, or extremely thirsty. SEEK IMMEDIATE MEDICAL CARE IF:   You are unable to keep fluids down or you get worse despite treatment.  You have frequent episodes of vomiting or diarrhea.  You have blood or green matter (bile) in your  vomit.  You have blood in your stool or your stool looks black and tarry.  You have not urinated in 6 to 8 hours, or you have only urinated a small amount of very dark urine.  You have a fever.  You faint. MAKE SURE YOU:   Understand these instructions.  Will watch your condition.  Will get help right away if you are not doing well or get worse. Document Released: 04/01/2005 Document Revised: 06/24/2011 Document Reviewed: 11/19/2010 [facility] Patient Information 2015 [place], [place]. This information is not intended to replace advice given to you by your health care provider. Make sure you discuss any questions you have with your health care provider.

## 2014-01-27 NOTE — Progress Notes (Signed)
SYMPTOM MANAGEMENT CLINIC   HPI: Sherri Stafford 63 y.o. female diagnosed with lung cancer.  Currently undergoing nivolumab therapy.  Patient called the cancer Center today requesting urgent care visit.  She states she is scheduled for cycle 2 or nivolumab therapy tomorrow 01/27/2014.  She also has plans to receive her [facility] tomorrow as well.  Patient is complaining of worsening generalized abdominal discomfort; as well as increased lower back pain.  She feels that she must be constipated; and has tried stool softeners, laxatives, and has given herself an enema within the past 24 hours with no effectiveness.  She denies specific bloating or cramping.  She denies any in her stool.  Her last bowel movement was 2 days ago.  Patient states she only takes tramadol for her abdominal/back pain; so she is very concerned regarding the possibility of opiate-induced constipation.  She states that she has had to go to the emergency department before of 4 opiate-induced constipation in the past.  She states she has been to a back specialist in the past 6 weeks; and had steroid injections to her back-this did not help.  She is complaining of increased fatigue and decreased mobility.  She states she has had neighbors come into stay with her while she is showering for safety.  She is requesting a home health evaluation for assistance with ADLs.  She states that she has had some physical therapy in her home in the past; but thinks that this needs to be reordered as well.  Also, patient states that she did experience nausea/vomiting x1 earlier today; but no longer feels nauseated.  She takes Zofran at home on occasion for her nausea.  She does feel that she is fairly dehydrated today.   Abdominal Pain Associated symptoms include constipation, nausea and vomiting. Pertinent negatives include no diarrhea or fever.    Review of Systems  Constitutional: Positive for malaise/fatigue. Negative for fever and chills.    HENT: Negative.   Eyes: Negative.   Respiratory: Negative.   Cardiovascular: Negative.   Gastrointestinal: Positive for nausea, vomiting, abdominal pain and constipation. Negative for diarrhea and blood in stool.  Genitourinary: Negative.   Musculoskeletal: Positive for back pain.  Skin: Negative.   Neurological: Negative.   Endo/Heme/Allergies: Negative.   Psychiatric/Behavioral: Negative.   All other systems reviewed and are negative.   Past Medical History  Diagnosis Date  . Arthritis   . Bilateral ovarian cysts   . GERD (gastroesophageal reflux disease)   . Strain of hip flexor 06/2013    left side torn  . Cancer     lung ca    Past Surgical History  Procedure Laterality Date  . Dilation and curettage of uterus  2004    x 3   . Tubal ligation    . Colonoscopy  04/05/2004    normal     has Nonspecific abnormal results of liver function study; Cough; Sarcoidosis; Unspecified vitamin D deficiency; Dyspnea; Adenocarcinoma of lung; Chest pain; Pulmonary embolism; Acute bronchitis; Bone metastases; Abdominal pain; Nausea without vomiting; Long term current use of anticoagulant therapy; Dysuria; Dehydration; Hyperbilirubinemia; and Hypoalbuminemia on her problem list.     is allergic to other; ketoconazole; lorazepam; morphine and related; nystatin; sporanox; tylenol; vitamin d; and zofran.    Medication List       This list is accurate as of: 01/26/14 11:59 PM.  Always use your most recent med list.  bisacodyl 10 MG suppository  Commonly known as:  DULCOLAX  Place 10 mg rectally daily as needed for moderate constipation.     calcium carbonate 1250 MG chewable tablet  Commonly known as:  OS-CAL  Chew 4 tablets by mouth daily.     codeine 30 MG tablet  Take 1 tablet (30 mg total) by mouth every 4 (four) hours as needed (cough).     docusate sodium 100 MG capsule  Commonly known as:  COLACE  Take 100 mg by mouth 2 (two) times daily.      enoxaparin 100 MG/ML injection  Commonly known as:  LOVENOX  Inject 0.9 mLs (90 mg total) into the skin daily.     esomeprazole 40 MG capsule  Commonly known as:  NEXIUM  Take 1 capsule (40 mg total) by mouth 2 (two) times daily before a meal.     fentaNYL 25 MCG/HR patch  Commonly known as:  DURAGESIC - dosed mcg/hr  Place 1 patch (25 mcg total) onto the skin every 3 (three) days.     gabapentin 600 MG tablet  Commonly known as:  NEURONTIN  Take 600 mg by mouth 3 (three) times daily.     glycerin adult 2 G Supp  Place 1 suppository rectally once as needed for moderate constipation.     loratadine 10 MG tablet  Commonly known as:  CLARITIN  Take 1 tablet (10 mg total) by mouth daily.     methylPREDNIsolone 4 MG tablet  Commonly known as:  MEDROL DOSPACK  follow package directions     polyethylene glycol packet  Commonly known as:  MIRALAX / GLYCOLAX  Take 17 g by mouth daily as needed for moderate constipation.     polyvinyl alcohol 1.4 % ophthalmic solution  Commonly known as:  LIQUIFILM TEARS  Place 1 drop into both eyes as needed for dry eyes.     prochlorperazine 10 MG tablet  Commonly known as:  COMPAZINE  Take 1 tablet (10 mg total) by mouth every 6 (six) hours as needed for nausea or vomiting.     senna 8.6 MG tablet  Commonly known as:  SENOKOT  Take 2 tablets by mouth 2 (two) times daily.         PHYSICAL EXAMINATION  Blood pressure 136/60, pulse 106, temperature 97.5 F (36.4 C), temperature source Oral, resp. rate 18, height 5\' 3"  (1.6 m), weight 131 lb 1.6 oz (59.467 kg), last menstrual period 01/14/2008, SpO2 100.00%.  Physical Exam  Nursing note and vitals reviewed. Constitutional: She is oriented to person, place, and time. Vital signs are normal. She appears dehydrated. She appears unhealthy.  HENT:  Head: Normocephalic and atraumatic.  Mouth/Throat: Oropharynx is clear and moist.  Eyes: Conjunctivae and EOM are normal. Pupils are equal, round,  and reactive to light. No scleral icterus.  Neck: Normal range of motion. Neck supple. No JVD present. No tracheal deviation present. No thyromegaly present.  Cardiovascular: Normal rate, regular rhythm, normal heart sounds and intact distal pulses.   Pulmonary/Chest: Effort normal and breath sounds normal. No respiratory distress. She has no wheezes.  Abdominal: Soft. Bowel sounds are normal. She exhibits no distension and no mass. There is no tenderness. There is no rebound and no guarding.  Musculoskeletal: Normal range of motion. She exhibits no edema and no tenderness.  Lymphadenopathy:    She has no cervical adenopathy.  Neurological: She is alert and oriented to person, place, and time.  Skin: Skin is warm and dry.  No rash noted. No erythema.  Psychiatric:  Patient appears anxious.    LABORATORY DATA:. Appointment on 01/26/2014  Component Date Value Ref Range Status  . WBC 01/26/2014 10.0  3.9 - 10.3 10e3/uL Final  . NEUT# 01/26/2014 7.5* 1.5 - 6.5 10e3/uL Final  . HGB 01/26/2014 9.9* 11.6 - 15.9 g/dL Final  . HCT 01/26/2014 30.4* 34.8 - 46.6 % Final  . Platelets 01/26/2014 307  145 - 400 10e3/uL Final  . MCV 01/26/2014 90.5  79.5 - 101.0 fL Final  . MCH 01/26/2014 29.3  25.1 - 34.0 pg Final  . MCHC 01/26/2014 32.4  31.5 - 36.0 g/dL Final  . RBC 01/26/2014 3.36* 3.70 - 5.45 10e6/uL Final  . RDW 01/26/2014 19.8* 11.2 - 14.5 % Final  . lymph# 01/26/2014 0.9  0.9 - 3.3 10e3/uL Final  . MONO# 01/26/2014 0.8  0.1 - 0.9 10e3/uL Final  . Eosinophils Absolute 01/26/2014 0.7* 0.0 - 0.5 10e3/uL Final  . Basophils Absolute 01/26/2014 0.1  0.0 - 0.1 10e3/uL Final  . NEUT% 01/26/2014 75.2  38.4 - 76.8 % Final  . LYMPH% 01/26/2014 9.2* 14.0 - 49.7 % Final  . MONO% 01/26/2014 7.5  0.0 - 14.0 % Final  . EOS% 01/26/2014 7.2* 0.0 - 7.0 % Final  . BASO% 01/26/2014 0.9  0.0 - 2.0 % Final  . Hold Tube, Blood Bank 01/26/2014 Blood Bank Order Cancelled   Final  . Sodium 01/26/2014 139  136 - 145  mEq/L Final  . Potassium 01/26/2014 4.5  3.5 - 5.1 mEq/L Final  . Chloride 01/26/2014 103  98 - 109 mEq/L Final  . CO2 01/26/2014 23  22 - 29 mEq/L Final  . Glucose 01/26/2014 123  70 - 140 mg/dl Final  . BUN 01/26/2014 12.1  7.0 - 26.0 mg/dL Final  . Creatinine 01/26/2014 0.9  0.6 - 1.1 mg/dL Final  . Total Bilirubin 01/26/2014 1.51* 0.20 - 1.20 mg/dL Final  . Alkaline Phosphatase 01/26/2014 68  40 - 150 U/L Final  . AST 01/26/2014 12  5 - 34 U/L Final  . ALT 01/26/2014 12  0 - 55 U/L Final  . Total Protein 01/26/2014 7.1  6.4 - 8.3 g/dL Final  . Albumin 01/26/2014 3.0* 3.5 - 5.0 g/dL Final  . Calcium 01/26/2014 9.8  8.4 - 10.4 mg/dL Final  . Anion Gap 01/26/2014 13* 3 - 11 mEq/L Final  . Glucose 01/26/2014 Negative  Negative mg/dL Final  . Bilirubin (Urine) 01/26/2014 Negative  Negative Final  . Ketones 01/26/2014 15  Negative mg/dL Final  . Specific Gravity, Urine 01/26/2014 1.020  1.003 - 1.035 Final  . Blood 01/26/2014 Negative  Negative Final  . pH 01/26/2014 6.5  4.6 - 8.0 Final  . Protein 01/26/2014 100  Negative- <30 mg/dL Final  . Urobilinogen, UR 01/26/2014 0.2  0.2 - 1 mg/dL Final  . Nitrite 01/26/2014 Negative  Negative Final  . Leukocyte Esterase 01/26/2014 Negative  Negative Final  . RBC / HPF 01/26/2014 Negative  0 - 2 Final  . WBC, UA 01/26/2014 0-2  0 - 2 Final  . Bacteria, UA 01/26/2014 Few  Negative- Trace Final  . Casts 01/26/2014 mod Hyaline, few granular  Negative Final  . Mucus, UA 01/26/2014 Large  Negative- Small Final  . Urine Culture, Routine 01/26/2014 Culture, Urine   Final   Comment: Final - ===== COLONY COUNT: =====  NO GROWTH                          NO GROWTH     RADIOGRAPHIC STUDIES: Dg Abd 1 View  01/26/2014   CLINICAL DATA:  Abdominal pain, nausea, vomiting and constipation.  EXAM: ABDOMEN - 1 VIEW  COMPARISON:  CT abdomen and pelvis 12/31/2013. Chest and two views abdomen 11/14/2013.  FINDINGS: The bowel gas pattern  is nonobstructive. Stool burden is unremarkable. No abnormal abdominal calcification is seen. Small sclerotic lesions in the right sacrum and ileum are identified, unchanged.  IMPRESSION: No acute finding.  Negative for constipation.   Electronically Signed   By: Inge Rise M.D.   On: 01/26/2014 10:22   US Abdomen Limited  01/26/2014   CLINICAL DATA:  Increased bilirubin  EXAM: US ABDOMEN LIMITED - RIGHT UPPER QUADRANT  COMPARISON:  CT chest 01/14/2014, CT abdomen 12/31/2013  FINDINGS: Gallbladder:  Cholelithiasis without pericholecystic fluid or wall thickening visualized. No sonographic Murphy sign noted.  Common bile duct:  Diameter: 1.9 mm  Liver:  There is a hypoechoic masslike area in the superior posterior liver measuring 2.5 x 2.8 x 2 cm most concerning for metastatic disease when correlated with recent CT chest dated 01/14/2014. Within normal limits in parenchymal echogenicity. No intrahepatic or extrahepatic biliary ductal dilatation.  IMPRESSION: 1. There is a hypoechoic masslike area in the superior posterior liver measuring 2.5 x 2.8 x 2 cm most concerning for metastatic disease when correlated with recent CT chest dated 01/14/2014. 2. No biliary obstruction. 3. Cholelithiasis without sonographic evidence of acute cholecystitis.   Electronically Signed   By: Kathreen Devoid   On: 01/26/2014 16:06    ASSESSMENT/PLAN:    Adenocarcinoma of lung  Assessment & Plan Patient appears to be tolerating her chemotherapy fairly well; with blood counts holding steady.  She will proceed tomorrow with cycle 2 of the nivolumab therapy.  She will return in 2 weeks for cycle 3 of the same regimen.    Bone metastases  Assessment & Plan Patient will receive Xgeva for her previously diagnosed pulmonary is tomorrow 01/27/2014 as well.  This is cycled on an every 28 day basis.   Abdominal pain  Assessment & Plan Patient continues to complain of some generalized abdominal discomfort; as well as some  low back pain.  KUB x-ray obtained earlier today revealed no constipation or obstruction.  Most likely, abdominal pain and back pain are both related to patient's up liver metastasis and bone metastasis.  Patient has been taking tramadol only for her back pain; stating that she is very concerned regarding the possibility of opiate-induced constipation.  After extensive discussion-the patient was in agreement to try fentanyl 25 mcg patch.  Patient will continue to use tramadol for breakthrough pain.     Nausea without vomiting  Assessment & Plan Patient did experience one episode of nausea/vomiting today.  She states she R. he has been suffering home to take on an as-needed basis for nausea.  She states her nausea has passed for the time being.   Long term current use of anticoagulant therapy  Assessment & Plan Patient has been been diagnosed with a pulmonary embolism and ventricular strain; and is receiving Lovenox on a daily basis.   Dysuria  Assessment & Plan Patient states that she does have some intermittent dysuria; but denies any frequency, hesitancy, or hematuria.  Urinalysis today was inconclusive regarding an UTI.  Urine culture pending  Dehydration  Assessment & Plan Patient still that she is fairly dehydrated today.  She will receive one liter normal saline IV fluid rehydration today; will also receive an additional 1 L normal saline IV fluid rehydration tomorrow when she returns for cycle 2 of her nivolumab.   Hyperbilirubinemia  Assessment & Plan Bilirubin has increased to 1.51.  Obtained a limited abdominal ultrasound to further evaluate right upper quadrant.  There was no specific bile duct obstruction noted per radiology.  Previously noted probable liver metastasis was noted however.   Hypoalbuminemia  Assessment & Plan Patient's albumin level remains low.  Patient was encouraged to push protein is much as possible in her diet.    Patient stated  understanding of all instructions; and was in agreement with this plan of care. The patient knows to call the clinic with any problems, questions or concerns.   This was a shared visit with Dr. Julien Nordmann today.   Total time spent with patient was 40 minutes;  with greater than 75 percent of that time spent in face to face counseling regarding her symptoms, review of her lab results, x-ray results, and ultrasound results and coordination of care and follow up.  Disclaimer: This note was dictated with voice recognition software. Similar sounding words can inadvertently be transcribed and may not be corrected upon review.   Drue Second, NP 01/28/2014   ADDENDUM: Hematology/Oncology Attending: I had a face to face encounter with the patient. I recommended her care plan. This is a very pleasant 63 years old white female with metastatic non-small cell lung cancer, adenocarcinoma status post induction chemotherapy with carboplatin and Alimta but unfortunately has evidence for disease progression. The patient was started on second line therapy with immunotherapy with Nivolumab status post 1 cycle. She tolerated the first cycle of her treatment fairly well but the patient still complaining of generalized weakness and fatigue as well as generalized abdominal pain and increased low back pain. Also has some mild nausea and vomiting. The patient is also dehydrated. Her only current pain medication is tramadol and the patient has a fear of opiate-induced constipation. Abdominal x-ray as well as ultrasound of the abdomen today showed no significant acute findings. I will arrange for the patient to receive 1 L of normal saline with anti-emetic in the clinic today. I also recommended for the patient to start fentanyl patch 25 mcg/hour every 3 days for pain management in addition to tramadol on as-needed basis. She agreed to the current plan. We will continue to monitor her closely and the patient would come back for  followup visit in less than 2 weeks for the next cycle of her immunotherapy. She was advised to call immediately if she has any concerning symptoms in the interval.  Disclaimer: This note was dictated with voice recognition software. Similar sounding words can inadvertently be transcribed and may be missed upon review. Eilleen Kempf., MD 01/30/2014

## 2014-01-27 NOTE — Telephone Encounter (Signed)
Pt confirmed labs/ov per 10/15 POF, sent msg to add chemo, gave pt AVS..... KJ

## 2014-01-27 NOTE — Assessment & Plan Note (Signed)
Patient continues to complain of some generalized abdominal discomfort; as well as some low back pain.  KUB x-ray obtained earlier today revealed no constipation or obstruction.  Most likely, abdominal pain and back pain are both related to patient's up liver metastasis and bone metastasis.  Patient has been taking tramadol only for her back pain; stating that she is very concerned regarding the possibility of opiate-induced constipation.  After extensive discussion-the patient was in agreement to try fentanyl 25 mcg patch.  Patient will continue to use tramadol for breakthrough pain.

## 2014-01-27 NOTE — Assessment & Plan Note (Signed)
Patient still that she is fairly dehydrated today.  She will receive one liter normal saline IV fluid rehydration today; will also receive an additional 1 L normal saline IV fluid rehydration tomorrow when she returns for cycle 2 of her nivolumab.

## 2014-01-27 NOTE — Assessment & Plan Note (Signed)
Patient appears to be tolerating her chemotherapy fairly well; with blood counts holding steady.  She will proceed tomorrow with cycle 2 of the nivolumab therapy.  She will return in 2 weeks for cycle 3 of the same regimen.

## 2014-01-27 NOTE — Telephone Encounter (Signed)
PT will see pt on Monday.  Sherrina called and asked if she could take tramadol in between pain patches. I told her yes as prescribed.Marland Kitchen

## 2014-01-27 NOTE — Assessment & Plan Note (Signed)
Patient has been been diagnosed with a pulmonary embolism and ventricular strain; and is receiving Lovenox on a daily basis.

## 2014-01-27 NOTE — Assessment & Plan Note (Signed)
Patient did experience one episode of nausea/vomiting today.  She states she R. he has been suffering home to take on an as-needed basis for nausea.  She states her nausea has passed for the time being.

## 2014-01-27 NOTE — Assessment & Plan Note (Signed)
Bilirubin has increased to 1.51.  Obtained a limited abdominal ultrasound to further evaluate right upper quadrant.  There was no specific bile duct obstruction noted per radiology.  Previously noted probable liver metastasis was noted however.

## 2014-01-27 NOTE — Assessment & Plan Note (Signed)
Patient states that she does have some intermittent dysuria; but denies any frequency, hesitancy, or hematuria.  Urinalysis today was inconclusive regarding an UTI.  Urine culture pending

## 2014-01-28 ENCOUNTER — Telehealth: Payer: Self-pay | Admitting: Medical Oncology

## 2014-01-28 ENCOUNTER — Ambulatory Visit (HOSPITAL_BASED_OUTPATIENT_CLINIC_OR_DEPARTMENT_OTHER): Payer: BC Managed Care – PPO | Admitting: Nurse Practitioner

## 2014-01-28 VITALS — BP 136/84 | HR 123 | Temp 98.4°F | Resp 19 | Ht 63.0 in | Wt 132.7 lb

## 2014-01-28 DIAGNOSIS — J40 Bronchitis, not specified as acute or chronic: Secondary | ICD-10-CM

## 2014-01-28 DIAGNOSIS — C7951 Secondary malignant neoplasm of bone: Secondary | ICD-10-CM

## 2014-01-28 DIAGNOSIS — C3432 Malignant neoplasm of lower lobe, left bronchus or lung: Secondary | ICD-10-CM

## 2014-01-28 DIAGNOSIS — J209 Acute bronchitis, unspecified: Secondary | ICD-10-CM

## 2014-01-28 DIAGNOSIS — Z7901 Long term (current) use of anticoagulants: Secondary | ICD-10-CM

## 2014-01-28 DIAGNOSIS — R109 Unspecified abdominal pain: Secondary | ICD-10-CM

## 2014-01-28 DIAGNOSIS — C3490 Malignant neoplasm of unspecified part of unspecified bronchus or lung: Secondary | ICD-10-CM

## 2014-01-28 MED ORDER — ALBUTEROL SULFATE HFA 108 (90 BASE) MCG/ACT IN AERS: 1.0000 | INHALATION_SPRAY | Freq: Four times a day (QID) | RESPIRATORY_TRACT | Status: AC | PRN

## 2014-01-28 MED ORDER — TRAMADOL HCL 50 MG PO TABS
50.0000 mg | ORAL_TABLET | Freq: Once | ORAL | Status: AC
  Administered 2014-01-28: 50 mg via ORAL

## 2014-01-28 MED ORDER — LEVOFLOXACIN 500 MG PO TABS: 500.0000 mg | ORAL_TABLET | Freq: Every day | ORAL | Status: DC

## 2014-01-28 NOTE — Telephone Encounter (Signed)
Pt notified that PT will start on Monday.

## 2014-01-28 NOTE — Telephone Encounter (Signed)
Pt called with increased coughing and " wheezing" . She finished Levaquin wed ( from inpt encounter- [name]) . His office is not answering their phone today. Dx with  PE 01/14/14. Appointment made for symptom management.

## 2014-01-31 ENCOUNTER — Telehealth: Payer: Self-pay | Admitting: *Deleted

## 2014-01-31 ENCOUNTER — Other Ambulatory Visit: Payer: Self-pay | Admitting: *Deleted

## 2014-01-31 ENCOUNTER — Encounter: Payer: Self-pay | Admitting: Nurse Practitioner

## 2014-01-31 DIAGNOSIS — C3490 Malignant neoplasm of unspecified part of unspecified bronchus or lung: Secondary | ICD-10-CM

## 2014-01-31 MED ORDER — BISACODYL 10 MG RE SUPP: 10.0000 mg | Freq: Every day | RECTAL | Status: AC | PRN

## 2014-01-31 NOTE — Assessment & Plan Note (Signed)
Patient received cycle 2 of her nivolumab immunotherapy just yesterday her to revisit in 2015.  She has plans to return for cycle 3 of the same regimen on 02/10/2014.

## 2014-01-31 NOTE — Assessment & Plan Note (Signed)
Patient continues to complain of both abdominal discomfort and some chronic low back pain.  Most likely, this is secondary to her cancer diagnosis of metastasis.  The patient states she is much more comfortable since initiating Fentanyl 25 mcg patch.  She continues to use tramadol for her breakthrough pain.

## 2014-01-31 NOTE — Progress Notes (Signed)
SYMPTOM MANAGEMENT CLINIC   HPI: Sherri Stafford 63 y.o. female diagnosed with lung cancer.  Currently undergoing nivolumab immunotherapy.  Also received Xgeva on a monthly basis for previously diagnosed bone metastasis.  Patient called the cancer Center today requesting an urgent care visit.  She just received cycle 2 of the nivolumab yesterday 01/27/2014.  She also received her monthly Xgeva for bone metastasis yesterday as well.  The patient is complaining of continued bronchitis symptoms.  She states she has just finished a five-day Levaquin therapy per her primary care physician Dr. Arelia Sneddon.  I she states she does continue with an occasional dry, nonproductive cough and occasional wheeze.  She's requesting a refill of the level of for another 3 days if at all possible.  She states she has been afebrile.  Also, I patient has recently initiated a fentanyl patch; and states that her chronic pain is much better under control now.  She continues to take tramadol for breakthrough pain.  She continues to remain concerned I regarding issues with constipation.  Once again reiterated with patient that I she would need to take a stool softeners on regular basis; intake MiraLAX as often it is as every 4-6 hours as needed to keep her bowels regular.     Cough    Review of Systems  Respiratory: Positive for cough.     Past Medical History  Diagnosis Date  . Arthritis   . Bilateral ovarian cysts   . GERD (gastroesophageal reflux disease)   . Strain of hip flexor 06/2013    left side torn  . Cancer     lung ca    Past Surgical History  Procedure Laterality Date  . Dilation and curettage of uterus  2004    x 3   . Tubal ligation    . Colonoscopy  04/05/2004    normal     has Nonspecific abnormal results of liver function study; Cough; Sarcoidosis; Unspecified vitamin D deficiency; Dyspnea; Adenocarcinoma of lung; Chest pain; Pulmonary embolism; Acute bronchitis; Bone metastases;  Abdominal pain; Nausea without vomiting; Long term current use of anticoagulant therapy; Dysuria; Dehydration; Hyperbilirubinemia; Hypoalbuminemia; and Bronchitis on her problem list.     is allergic to other; ketoconazole; lorazepam; morphine and related; nystatin; sporanox; tylenol; vitamin d; and zofran.    Medication List       This list is accurate as of: 01/28/14 11:59 PM.  Always use your most recent med list.               albuterol 108 (90 BASE) MCG/ACT inhaler  Commonly known as:  PROVENTIL HFA;VENTOLIN HFA  Inhale 1-2 puffs into the lungs every 6 (six) hours as needed for wheezing or shortness of breath.     bisacodyl 10 MG suppository  Commonly known as:  DULCOLAX  Place 10 mg rectally daily as needed for moderate constipation.     calcium carbonate 1250 MG chewable tablet  Commonly known as:  OS-CAL  Chew 4 tablets by mouth daily.     codeine 30 MG tablet  Take 1 tablet (30 mg total) by mouth every 4 (four) hours as needed (cough).     docusate sodium 100 MG capsule  Commonly known as:  COLACE  Take 100 mg by mouth 2 (two) times daily.     enoxaparin 100 MG/ML injection  Commonly known as:  LOVENOX  Inject 0.9 mLs (90 mg total) into the skin daily.     esomeprazole 40 MG capsule  Commonly known as:  NEXIUM  Take 1 capsule (40 mg total) by mouth 2 (two) times daily before a meal.     fentaNYL 25 MCG/HR patch  Commonly known as:  DURAGESIC - dosed mcg/hr  Place 1 patch (25 mcg total) onto the skin every 3 (three) days.     gabapentin 600 MG tablet  Commonly known as:  NEURONTIN  Take 600 mg by mouth 3 (three) times daily.     glycerin adult 2 G Supp  Place 1 suppository rectally once as needed for moderate constipation.     levofloxacin 500 MG tablet  Commonly known as:  LEVAQUIN  Take 1 tablet (500 mg total) by mouth daily.     loratadine 10 MG tablet  Commonly known as:  CLARITIN  Take 1 tablet (10 mg total) by mouth daily.      methylPREDNIsolone 4 MG tablet  Commonly known as:  MEDROL DOSPACK  follow package directions     polyethylene glycol packet  Commonly known as:  MIRALAX / GLYCOLAX  Take 17 g by mouth daily as needed for moderate constipation.     polyvinyl alcohol 1.4 % ophthalmic solution  Commonly known as:  LIQUIFILM TEARS  Place 1 drop into both eyes as needed for dry eyes.     prochlorperazine 10 MG tablet  Commonly known as:  COMPAZINE  Take 1 tablet (10 mg total) by mouth every 6 (six) hours as needed for nausea or vomiting.     senna 8.6 MG tablet  Commonly known as:  SENOKOT  Take 2 tablets by mouth 2 (two) times daily.     traMADol 50 MG tablet  Commonly known as:  ULTRAM  Every 6-8 hours (per patient)         PHYSICAL EXAMINATION  Blood pressure 136/84, pulse 123, temperature 98.4 F (36.9 C), temperature source Oral, resp. rate 19, height 5\' 3"  (1.6 m), weight 132 lb 11.2 oz (60.192 kg), last menstrual period 01/14/2008, SpO2 98.00%.  Physical Exam  Nursing note and vitals reviewed. Constitutional: She is oriented to person, place, and time. Vital signs are normal. She appears unhealthy.  HENT:  Head: Normocephalic.  Mouth/Throat: Oropharynx is clear and moist.  Eyes: Conjunctivae and EOM are normal. Pupils are equal, round, and reactive to light. Right eye exhibits no discharge. Left eye exhibits no discharge. No scleral icterus.  Neck: Normal range of motion. Neck supple. No JVD present. No tracheal deviation present. No thyromegaly present.  Cardiovascular: Normal rate, regular rhythm, normal heart sounds and intact distal pulses.   Pulmonary/Chest: Effort normal and breath sounds normal. No respiratory distress. She has no wheezes. She has no rales.  Abdominal: Soft. Bowel sounds are normal. She exhibits no distension. There is no tenderness. There is no rebound.  Musculoskeletal: Normal range of motion. She exhibits no edema and no tenderness.  Lymphadenopathy:    She  has no cervical adenopathy.  Neurological: She is alert and oriented to person, place, and time.  Skin: Skin is warm and dry. No rash noted. No erythema.  Psychiatric: Affect normal.    LABORATORY DATA:. Appointment on 01/26/2014  Component Date Value Ref Range Status  . WBC 01/26/2014 10.0  3.9 - 10.3 10e3/uL Final  . NEUT# 01/26/2014 7.5* 1.5 - 6.5 10e3/uL Final  . HGB 01/26/2014 9.9* 11.6 - 15.9 g/dL Final  . HCT 01/26/2014 30.4* 34.8 - 46.6 % Final  . Platelets 01/26/2014 307  145 - 400 10e3/uL Final  . MCV 01/26/2014  90.5  79.5 - 101.0 fL Final  . MCH 01/26/2014 29.3  25.1 - 34.0 pg Final  . MCHC 01/26/2014 32.4  31.5 - 36.0 g/dL Final  . RBC 01/26/2014 3.36* 3.70 - 5.45 10e6/uL Final  . RDW 01/26/2014 19.8* 11.2 - 14.5 % Final  . lymph# 01/26/2014 0.9  0.9 - 3.3 10e3/uL Final  . MONO# 01/26/2014 0.8  0.1 - 0.9 10e3/uL Final  . Eosinophils Absolute 01/26/2014 0.7* 0.0 - 0.5 10e3/uL Final  . Basophils Absolute 01/26/2014 0.1  0.0 - 0.1 10e3/uL Final  . NEUT% 01/26/2014 75.2  38.4 - 76.8 % Final  . LYMPH% 01/26/2014 9.2* 14.0 - 49.7 % Final  . MONO% 01/26/2014 7.5  0.0 - 14.0 % Final  . EOS% 01/26/2014 7.2* 0.0 - 7.0 % Final  . BASO% 01/26/2014 0.9  0.0 - 2.0 % Final  . Hold Tube, Blood Bank 01/26/2014 Blood Bank Order Cancelled   Final  . Sodium 01/26/2014 139  136 - 145 mEq/L Final  . Potassium 01/26/2014 4.5  3.5 - 5.1 mEq/L Final  . Chloride 01/26/2014 103  98 - 109 mEq/L Final  . CO2 01/26/2014 23  22 - 29 mEq/L Final  . Glucose 01/26/2014 123  70 - 140 mg/dl Final  . BUN 01/26/2014 12.1  7.0 - 26.0 mg/dL Final  . Creatinine 01/26/2014 0.9  0.6 - 1.1 mg/dL Final  . Total Bilirubin 01/26/2014 1.51* 0.20 - 1.20 mg/dL Final  . Alkaline Phosphatase 01/26/2014 68  40 - 150 U/L Final  . AST 01/26/2014 12  5 - 34 U/L Final  . ALT 01/26/2014 12  0 - 55 U/L Final  . Total Protein 01/26/2014 7.1  6.4 - 8.3 g/dL Final  . Albumin 01/26/2014 3.0* 3.5 - 5.0 g/dL Final  . Calcium  01/26/2014 9.8  8.4 - 10.4 mg/dL Final  . Anion Gap 01/26/2014 13* 3 - 11 mEq/L Final  . Glucose 01/26/2014 Negative  Negative mg/dL Final  . Bilirubin (Urine) 01/26/2014 Negative  Negative Final  . Ketones 01/26/2014 15  Negative mg/dL Final  . Specific Gravity, Urine 01/26/2014 1.020  1.003 - 1.035 Final  . Blood 01/26/2014 Negative  Negative Final  . pH 01/26/2014 6.5  4.6 - 8.0 Final  . Protein 01/26/2014 100  Negative- <30 mg/dL Final  . Urobilinogen, UR 01/26/2014 0.2  0.2 - 1 mg/dL Final  . Nitrite 01/26/2014 Negative  Negative Final  . Leukocyte Esterase 01/26/2014 Negative  Negative Final  . RBC / HPF 01/26/2014 Negative  0 - 2 Final  . WBC, UA 01/26/2014 0-2  0 - 2 Final  . Bacteria, UA 01/26/2014 Few  Negative- Trace Final  . Casts 01/26/2014 mod Hyaline, few granular  Negative Final  . Mucus, UA 01/26/2014 Large  Negative- Small Final  . Urine Culture, Routine 01/26/2014 Culture, Urine   Final   Comment: Final - ===== COLONY COUNT: =====                          NO GROWTH                          NO GROWTH     RADIOGRAPHIC STUDIES: No results found.  ASSESSMENT/PLAN:    Adenocarcinoma of lung  Assessment & Plan Patient received cycle 2 of her nivolumab immunotherapy just yesterday her to revisit in 2015.  She has plans to return for cycle 3 of the same  regimen on 02/10/2014.   Bone metastases  Assessment & Plan Patient also continues to receive Xgeva on a monthly basis for her previously diagnosed bone metastasis.  She received her last Xgeva just yesterday on 01/27/2014.   Abdominal pain  Assessment & Plan Patient continues to complain of both abdominal discomfort and some chronic low back pain.  Most likely, this is secondary to her cancer diagnosis of metastasis.  The patient states she is much more comfortable since initiating Fentanyl 25 mcg patch.  She continues to use tramadol for her breakthrough pain.   Long term current use of anticoagulant  therapy  Assessment & Plan Patient is present diagnosed with a pulmonary embolism; and continues on chronic Lovenox.   Bronchitis  Assessment & Plan Patient is being treated for bronchitis per Dr. Arelia Sneddon; and just finished a five-day course of Levaquin yesterday.  Patient continues to complain of some occasional dry, non--productive cough and occasional wheeze.  She's requesting an additional 3 days of Levaquin therapy due to her concern regarding the continued cough and wheeze.  Patient denies any recent fevers or chills.  She denies any chest pain, chest pressure, or pain with inspiration.  Patient did not appear in any distress whatsoever during exam today.  She had no cough or wheeze on exam.  Patient was prescribed an additional 3 days of Levaquin therapy at 500 mg per day.  She was also given a prescription for an albuterol inhaler to use on a when necessary basis for any wheezing.  Also, confirmed with patient that a home health evaluation/referral has already been made earlier this week.  Patient has a physical therapy in home evaluation scheduled for this coming Monday, 01/31/2014.  Patient stated understanding of all instructions; and was in agreement with this plan of care. The patient knows to call the clinic with any problems, questions or concerns.   Review/collaboration with Dr. Julien Nordmann regarding all aspects of patient's visit today.   Total time spent with patient was 40 minutes;  with greater than 75 percent of that time spent in face to face counseling regarding her symptoms, and coordination of care and follow up.  Disclaimer: This note was dictated with voice recognition software. Similar sounding words can inadvertently be transcribed and may not be corrected upon review.   Drue Second, NP 01/31/2014

## 2014-01-31 NOTE — Assessment & Plan Note (Signed)
Patient also continues to receive Xgeva on a monthly basis for her previously diagnosed bone metastasis.  She received her last Xgeva just yesterday on 01/27/2014.

## 2014-01-31 NOTE — Telephone Encounter (Signed)
Received call from [facility]. [facility] with [facility].  He and nursing will be coming to pt's home.  Clair Gulling is requesting verbal order for speech therapy.  Verbal order given.

## 2014-01-31 NOTE — Assessment & Plan Note (Signed)
Patient is present diagnosed with a pulmonary embolism; and continues on chronic Lovenox.

## 2014-01-31 NOTE — Assessment & Plan Note (Signed)
Patient is being treated for bronchitis per Dr. Arelia Sneddon; and just finished a five-day course of Levaquin yesterday.  Patient continues to complain of some occasional dry, non--productive cough and occasional wheeze.  She's requesting an additional 3 days of Levaquin therapy due to her concern regarding the continued cough and wheeze.  Patient denies any recent fevers or chills.  She denies any chest pain, chest pressure, or pain with inspiration.  Patient did not appear in any distress whatsoever during exam today.  She had no cough or wheeze on exam.  Patient was prescribed an additional 3 days of Levaquin therapy at 500 mg per day.  She was also given a prescription for an albuterol inhaler to use on a when necessary basis for any wheezing.

## 2014-01-31 NOTE — Telephone Encounter (Signed)
Spoke with pt today for symptom management follow up.  Pt stated she was feeling ok; however, pt still has non-productive cough.  Last dose of Levaquin is today.  Denied fever, denied pain, bowel and bladder function fine,  Good appetite and drinking lots of fluids as tolerated.  Pt understood to call office  if pt develops increased temp, and/or develops productive cough. Informed pt that radiation oncology referral has been made.  Pt will be contacted with appt by radiation scheduler.

## 2014-01-31 NOTE — Telephone Encounter (Signed)
Pt called with the following concerns:  -Has not heard from home health.  Informed patient that I sent message to Lurlean Leyden to f/u with referral -She is requesting a medrol dosepack for appetite.  Per Dr Vista Mink, eat small frequent meals and supplement with ensure or boost prn.  Being on nivolumab she should start to have an increase in appetite and she cannot be on steroids while on nivolumab -She wants and update regarding a rad-onc referral.  Per Dr Vista Mink, referral can be made today. -Stating she took codeine 1x for cough and it did not help.  Advised she try to take it as prescribed q4h to see if it helps and if it does not she can call back. -Requesting a refill on the colace suppository.  Refill order placed in EPIC.    Pt verbalized understanding.

## 2014-02-01 ENCOUNTER — Telehealth: Payer: Self-pay | Admitting: *Deleted

## 2014-02-01 NOTE — Telephone Encounter (Signed)
Pt called stating she is still c/o SOB and cough.  She has been taking codeine with no relief.  Her cough is not productive and she denies fever.  Per Dr Vista Mink, pt can be evaluated by Selena Lesser.  Appt given to pt for tomorrow.  Pt verbalized understanding

## 2014-02-02 ENCOUNTER — Ambulatory Visit: Payer: BC Managed Care – PPO

## 2014-02-02 ENCOUNTER — Other Ambulatory Visit: Payer: Self-pay | Admitting: Medical Oncology

## 2014-02-02 ENCOUNTER — Other Ambulatory Visit (HOSPITAL_BASED_OUTPATIENT_CLINIC_OR_DEPARTMENT_OTHER): Payer: BC Managed Care – PPO

## 2014-02-02 ENCOUNTER — Ambulatory Visit (HOSPITAL_BASED_OUTPATIENT_CLINIC_OR_DEPARTMENT_OTHER): Payer: BC Managed Care – PPO | Admitting: Nurse Practitioner

## 2014-02-02 ENCOUNTER — Telehealth: Payer: Self-pay | Admitting: Internal Medicine

## 2014-02-02 VITALS — BP 103/59 | HR 127 | Temp 98.4°F | Resp 18 | Ht 63.0 in | Wt 129.0 lb

## 2014-02-02 DIAGNOSIS — E8809 Other disorders of plasma-protein metabolism, not elsewhere classified: Secondary | ICD-10-CM

## 2014-02-02 DIAGNOSIS — Z95828 Presence of other vascular implants and grafts: Secondary | ICD-10-CM

## 2014-02-02 DIAGNOSIS — G893 Neoplasm related pain (acute) (chronic): Secondary | ICD-10-CM

## 2014-02-02 DIAGNOSIS — E860 Dehydration: Secondary | ICD-10-CM

## 2014-02-02 DIAGNOSIS — C7951 Secondary malignant neoplasm of bone: Secondary | ICD-10-CM

## 2014-02-02 DIAGNOSIS — C3490 Malignant neoplasm of unspecified part of unspecified bronchus or lung: Secondary | ICD-10-CM

## 2014-02-02 DIAGNOSIS — D630 Anemia in neoplastic disease: Secondary | ICD-10-CM

## 2014-02-02 DIAGNOSIS — C3432 Malignant neoplasm of lower lobe, left bronchus or lung: Secondary | ICD-10-CM

## 2014-02-02 DIAGNOSIS — J40 Bronchitis, not specified as acute or chronic: Secondary | ICD-10-CM

## 2014-02-02 DIAGNOSIS — Z7901 Long term (current) use of anticoagulants: Secondary | ICD-10-CM

## 2014-02-02 LAB — CBC WITH DIFFERENTIAL/PLATELET
BASO%: 0.3 % (ref 0.0–2.0)
Basophils Absolute: 0 10*3/uL (ref 0.0–0.1)
EOS%: 3.7 % (ref 0.0–7.0)
Eosinophils Absolute: 0.3 10*3/uL (ref 0.0–0.5)
HEMATOCRIT: 28.2 % — AB (ref 34.8–46.6)
HGB: 9 g/dL — ABNORMAL LOW (ref 11.6–15.9)
LYMPH%: 14.8 % (ref 14.0–49.7)
MCH: 28.6 pg (ref 25.1–34.0)
MCHC: 31.9 g/dL (ref 31.5–36.0)
MCV: 89.5 fL (ref 79.5–101.0)
MONO#: 0.5 10*3/uL (ref 0.1–0.9)
MONO%: 6.8 % (ref 0.0–14.0)
NEUT#: 5.4 10*3/uL (ref 1.5–6.5)
NEUT%: 74.4 % (ref 38.4–76.8)
PLATELETS: 236 10*3/uL (ref 145–400)
RBC: 3.15 10*6/uL — AB (ref 3.70–5.45)
RDW: 19 % — ABNORMAL HIGH (ref 11.2–14.5)
WBC: 7.3 10*3/uL (ref 3.9–10.3)
lymph#: 1.1 10*3/uL (ref 0.9–3.3)

## 2014-02-02 LAB — COMPREHENSIVE METABOLIC PANEL (CC13)
ALT: 15 U/L (ref 0–55)
AST: 16 U/L (ref 5–34)
Albumin: 2.9 g/dL — ABNORMAL LOW (ref 3.5–5.0)
Alkaline Phosphatase: 55 U/L (ref 40–150)
Anion Gap: 10 mEq/L (ref 3–11)
BUN: 15 mg/dL (ref 7.0–26.0)
CHLORIDE: 100 meq/L (ref 98–109)
CO2: 26 mEq/L (ref 22–29)
CREATININE: 0.8 mg/dL (ref 0.6–1.1)
Calcium: 9.3 mg/dL (ref 8.4–10.4)
Glucose: 114 mg/dl (ref 70–140)
Potassium: 3.8 mEq/L (ref 3.5–5.1)
SODIUM: 137 meq/L (ref 136–145)
TOTAL PROTEIN: 6.3 g/dL — AB (ref 6.4–8.3)
Total Bilirubin: 1.36 mg/dL — ABNORMAL HIGH (ref 0.20–1.20)

## 2014-02-02 MED ORDER — SODIUM CHLORIDE 0.9 % IJ SOLN
10.0000 mL | INTRAMUSCULAR | Status: DC | PRN
  Administered 2014-02-02: 10 mL via INTRAVENOUS
  Filled 2014-02-02: qty 10

## 2014-02-02 MED ORDER — FENTANYL 50 MCG/HR TD PT72: 50.0000 ug | MEDICATED_PATCH | TRANSDERMAL | Status: DC

## 2014-02-02 MED ORDER — HEPARIN SOD (PORK) LOCK FLUSH 100 UNIT/ML IV SOLN
500.0000 [IU] | Freq: Once | INTRAVENOUS | Status: AC
  Administered 2014-02-02: 500 [IU] via INTRAVENOUS
  Filled 2014-02-02: qty 5

## 2014-02-02 NOTE — Progress Notes (Addendum)
Histology and Location of Primary Cancer stage IV (T2a, N3, M1b) non-small cell lung cancer, adenocarcinoma presented with large left lower lobe lung mass in addition to bilateral pulmonary nodules and mediastinal and supraclavicular lymphadenopathy diagnosed in May of 2015.    Sites of Visceral and Bony Metastatic Disease: left hip, brain met  Location(s) of Symptomatic Metastases: has pain in her spine. MRI of lumbar spine at [facility] from 09/11/13 shows "T1 hypointense, T2 hyperintense, enhancing lesion within the antero superior aspect of the S1 vertebral body consistent with metastasis.  Possible additional 4 mm metastasis within the left lateral aspect of the L1 vertebral  Body."  Past/Anticipated chemotherapy by medical oncology, if any: Systemic chemotherapy with carboplatin for AUC of 5, Alimta 500 mg/M2 and Avastin 15 mg/KG every 3 weeks, status post 5 cycles, discontinued secondary to disease progression. The first 4 cycles of her treatments were given at [facility] under the care of Dr. Oretha Caprice. CURRENT THERAPY: 1) immunotherapy with Nivolumab 3 MG/KG every 2 weeks. Status post 1 cycle. 2) Xgeva 120 mcg subcutaneously every 2 months.  Pain on a scale of 0-10 is: right leg and hip painful at night when laying down, lower back at a 5/10, stomach from cough, is using a 50 mcg fentanyl patch   If Spine Met(s), symptoms, if any, include:  Bowel/Bladder retention or incontinence (please describe): constipation, takes colace, senna and miralax  Numbness or weakness in extremities (please describe): no  Current Decadron regimen, if applicable: no  Ambulatory status? Walker? Wheelchair?: uses a walker at home, she is in a wheelchair today.  SAFETY ISSUES: Prior radiation? yesstatus post palliative radiotherapy to the left hip and sacrum 20 gray between 06/12 to 09/30/2013 at [facility].  status post stereotactic radiotherapy to brain lesions under the care  of Dr. Katherine Roan at [place] on 11/17/2013.  Pacemaker/ICD? no  Possible current pregnancy? no  Is the patient on methotrexate? no  Current Complaints / other details:  She reports having an appointment with [facility] on 10/30 for a brian scan.  She is here today with her friend.  Reports a poor appetite and has lost 30 lbs since diagnosis.  States that she gets chocked up when swallowing.

## 2014-02-02 NOTE — Patient Instructions (Signed)

## 2014-02-02 NOTE — Telephone Encounter (Signed)
gv adn printed appt sched and avs for pt for OCT and NVO...sed added tx.

## 2014-02-03 ENCOUNTER — Ambulatory Visit
Admission: RE | Admit: 2014-02-03 | Discharge: 2014-02-03 | Disposition: A | Discharge status: Home / Self Care | Payer: BC Managed Care – PPO | Source: Ambulatory Visit | Attending: Radiation Oncology | Admitting: Radiation Oncology

## 2014-02-03 ENCOUNTER — Encounter: Payer: Self-pay | Admitting: Nurse Practitioner

## 2014-02-03 ENCOUNTER — Other Ambulatory Visit: Payer: Self-pay | Admitting: *Deleted

## 2014-02-03 ENCOUNTER — Encounter: Payer: Self-pay | Admitting: Radiation Oncology

## 2014-02-03 ENCOUNTER — Other Ambulatory Visit: Payer: Self-pay | Admitting: Medical Oncology

## 2014-02-03 ENCOUNTER — Ambulatory Visit (HOSPITAL_BASED_OUTPATIENT_CLINIC_OR_DEPARTMENT_OTHER): Payer: BC Managed Care – PPO

## 2014-02-03 VITALS — BP 130/80 | HR 112 | Temp 98.2°F | Resp 18

## 2014-02-03 VITALS — BP 103/77 | HR 124 | Temp 98.4°F | Resp 20 | Ht 63.0 in | Wt 129.2 lb

## 2014-02-03 DIAGNOSIS — K219 Gastro-esophageal reflux disease without esophagitis: Secondary | ICD-10-CM | POA: Insufficient documentation

## 2014-02-03 DIAGNOSIS — T40605A Adverse effect of unspecified narcotics, initial encounter: Secondary | ICD-10-CM | POA: Diagnosis not present

## 2014-02-03 DIAGNOSIS — Z7901 Long term (current) use of anticoagulants: Secondary | ICD-10-CM | POA: Insufficient documentation

## 2014-02-03 DIAGNOSIS — E860 Dehydration: Secondary | ICD-10-CM

## 2014-02-03 DIAGNOSIS — C7951 Secondary malignant neoplasm of bone: Secondary | ICD-10-CM | POA: Insufficient documentation

## 2014-02-03 DIAGNOSIS — I2699 Other pulmonary embolism without acute cor pulmonale: Secondary | ICD-10-CM | POA: Insufficient documentation

## 2014-02-03 DIAGNOSIS — Z79899 Other long term (current) drug therapy: Secondary | ICD-10-CM | POA: Diagnosis not present

## 2014-02-03 DIAGNOSIS — D630 Anemia in neoplastic disease: Secondary | ICD-10-CM | POA: Insufficient documentation

## 2014-02-03 DIAGNOSIS — Z923 Personal history of irradiation: Secondary | ICD-10-CM | POA: Insufficient documentation

## 2014-02-03 DIAGNOSIS — C3492 Malignant neoplasm of unspecified part of left bronchus or lung: Secondary | ICD-10-CM | POA: Diagnosis not present

## 2014-02-03 DIAGNOSIS — Z510 Encounter for antineoplastic radiation therapy: Secondary | ICD-10-CM | POA: Diagnosis not present

## 2014-02-03 DIAGNOSIS — Z79891 Long term (current) use of opiate analgesic: Secondary | ICD-10-CM | POA: Diagnosis not present

## 2014-02-03 DIAGNOSIS — C3490 Malignant neoplasm of unspecified part of unspecified bronchus or lung: Secondary | ICD-10-CM

## 2014-02-03 DIAGNOSIS — K5909 Other constipation: Secondary | ICD-10-CM | POA: Insufficient documentation

## 2014-02-03 DIAGNOSIS — C7931 Secondary malignant neoplasm of brain: Secondary | ICD-10-CM | POA: Insufficient documentation

## 2014-02-03 DIAGNOSIS — C3432 Malignant neoplasm of lower lobe, left bronchus or lung: Secondary | ICD-10-CM

## 2014-02-03 DIAGNOSIS — G893 Neoplasm related pain (acute) (chronic): Secondary | ICD-10-CM | POA: Insufficient documentation

## 2014-02-03 HISTORY — DX: Reserved for inherently not codable concepts without codable children: IMO0001

## 2014-02-03 HISTORY — DX: Reserved for concepts with insufficient information to code with codable children: IMO0002

## 2014-02-03 HISTORY — DX: Secondary malignant neoplasm of bone: C79.51

## 2014-02-03 MED ORDER — SODIUM CHLORIDE 0.9 % IV SOLN: INTRAVENOUS | Status: AC

## 2014-02-03 MED ORDER — PROCHLORPERAZINE MALEATE 10 MG PO TABS
ORAL_TABLET | ORAL | Status: AC
  Filled 2014-02-03: qty 1

## 2014-02-03 MED ORDER — PROCHLORPERAZINE MALEATE 10 MG PO TABS
10.0000 mg | ORAL_TABLET | Freq: Once | ORAL | Status: AC
  Administered 2014-02-03: 10 mg via ORAL

## 2014-02-03 MED ORDER — SODIUM CHLORIDE 0.9 % IV SOLN
Freq: Once | INTRAVENOUS | Status: DC
  Administered 2014-02-03: 14:00:00 via INTRAVENOUS

## 2014-02-03 NOTE — Progress Notes (Signed)
Treatment Summary - Marca Ancona, MD - 09/30/2013 7:12 PM EDT  RADIATION ONCOLOGY TREATMENT COMPLETION SUMMARY - [facility]   Diagnosis: Symptomatic Bone Metastases from [place] Histology: Stage:  Narrative: Sherri Stafford is 63 y.o. female seen in consultation at the request of Dr. Oretha Caprice for evaluation and potential radiotherapy. Please see my consultation note of 09/14/13 for a detailed history and physical. Briefly, Sherri Stafford was recently diagnosed with metastatic NSCLC. She presented to her PCP on 06/28/13 with a dry cough and chest congestion. CXR showed multiple bilateral pulmonary masses. Repeat CXR on 08/10/13 showed numerous bilateral masses with the largest measuring 4.1 cm in the LLL. CT chest on 08/12/13 showed inumerable bilateral pulmonary nodules with bilateral hilar and mediastinal lymphandenopathy. A [place] core biopsy of a right supraclavicular node on 08/23/13 was postivie for metastatic adenocarcinoma, TTF-1 positive. PET/CT on 09/09/13 showed bilateral hypermetabolic lesions throughout the thorax with lymphadenopathy, and hypermetabolic lesions in the let 6 th rib, left superior/posteroir acetabulum, L3 and upper sacrum. MRI brain on 09/09/13, which Dr. Leonel Ramsay personally reviewed, shows an enhancing 4 mm lesion in the left cerebellum and a 2 mm enhancing lesion in the left cingulate gyrus and , both without significant associated vasogenic edema. In clinic, she describes 4/10 left hip/groin pain which radiates down her left leg to her knee. She additionally describes upper sacral pain which is also a 3/10. She is currently oxycontin 10 mg BID. She notes that her pain is far worse without the pain medication. She was previously taking codeine for the pain which also helped control her cough. Since she switched to oxycontin, her cough has been far worse. She has a dry cough that is made worse by deep inspiration. She also has a feeling of discomfort in her left lower ribs. She  occasionally has occipital headaches that are most bothersome at night. These are well controlled with an icepack on the back of her neck. She has a history of vertigo for the past three years which has been stable. She denies visual or auditory changes, dysarthria, dysphagia, ataxia, seizures, numbness/tingling, focal weakness, nausea/vomiting, or changes in bowel or bladder function. Dr. Nydia Bouton subsequently saw her and did not recommend surgical intervention for her hip lesion. I recommended palliative RT to the left hip and sacrum and, after a thorough review of the plan for radiotherapy including risks, benefits, side effects, logistics and uncertainties, she freely elected to proceed.  Simulation type/date: 3D 09/23/13   Region Treated & Technique Total Dose (cGy) Elapsed Days Dose/Fraction (cGy) Treatment Dates  Lt Hip, AP-PA 2000 7 400 6/12 - 09/30/13  Sacrum, AP-PA 2000 7 400 6/12 - 09/30/13   Check all that apply: _X__Completed as planned ___Unplanned Break ___Unplanned admission ___Did not complete ___Died during treatment  Clinical Course and Patient Disposition: Sherri Stafford tolerated radiation therapy well, though she continued to experience the sequelae from her chemotherapy. She will return to clinic on 11/02/13 with a brain MRI (for evaluation of possible brain metastases), following up in the interim with Dr. Oretha Caprice and her other care providers, as scheduled.  Radiation Oncology Attending Physician: Lynden Ang, MD, PhD  cc: Atlee Abide, MD Vivi Barrack, MD

## 2014-02-03 NOTE — Assessment & Plan Note (Signed)
Patient continues to complain of some chronic low back and occasional abdominal discomfort.  Her pain has been much better under control what she initiated the Fentanyl patch.  However, she does continue to complain of some mild pain on a regular basis.  I will try patient on the increased Fentanyl patch at 50 mcg.  Patient also takes a tramadol on intermittent basis for breakthrough pain.  She also states she occasionally takes codeine for her cough as well.  Patient has been very concerned regarding the possibility of opioid-induced constipation; and has been on a strict bowel regimen to prevent any constipation issues recently.

## 2014-02-03 NOTE — Assessment & Plan Note (Signed)
Bilirubin has improved from 1.51 down to 1.36.  Will continue to monitor.

## 2014-02-03 NOTE — Assessment & Plan Note (Signed)
Patient continues to receive Xgeva on a monthly basis for her previously diagnosed bone metastasis.  She last received the Xgeva infusion on 01/27/2014; and will be due for her next cycle of Xgeva in one month.

## 2014-02-03 NOTE — Progress Notes (Signed)
Please see the Nurse Progress Note in the MD Initial Consult Encounter for this patient. 

## 2014-02-03 NOTE — Assessment & Plan Note (Signed)
Patient received cycle 2 of her nivolumab immunotherapy on 01/27/2014.  She will be due for cycle 3 of the same regimen on 02/10/2014.  Patient has been on prolonged Levaquin therapy for presumed bronchitis per her primary care physician; with minimal effectiveness.  Most likely, patient's chronic cough is related to her lung cancer.  Patient has also tried albuterol inhalers with no improvement.  She continues to complain of some mild, chronic shortness of breath with exertion.  Did check of patient's oxygen saturation at rest and when walking down the hall; and the lowest oxygen saturation reading was 97% on room air.  Patient did not appear in acute distress today on exam.  Hopefully, the nivolumab therapy will help decrease the symptoms.  Also, patient has an initial consult with radiation oncologist Dr. Sondra Come scheduled for tomorrow.

## 2014-02-03 NOTE — Assessment & Plan Note (Signed)
Patient continues with low albumin.  Patient was encouraged to push protein in her diet is much as possible.

## 2014-02-03 NOTE — Assessment & Plan Note (Signed)
Patient is a slightly more anemic today with hemoglobin down from 9.9-9.0.  Will continue to monitor closely.

## 2014-02-03 NOTE — Patient Instructions (Signed)
Dehydration, Adult Dehydration is when you lose more fluids from the body than you take in. Vital organs like the kidneys, brain, and heart cannot function without a proper amount of fluids and salt. Any loss of fluids from the body can cause dehydration.  CAUSES   Vomiting.  Diarrhea.  Excessive sweating.  Excessive urine output.  Fever. SYMPTOMS  Mild dehydration  Thirst.  Dry lips.  Slightly dry mouth. Moderate dehydration  Very dry mouth.  Sunken eyes.  Skin does not bounce back quickly when lightly pinched and released.  Dark urine and decreased urine production.  Decreased tear production.  Headache. Severe dehydration  Very dry mouth.  Extreme thirst.  Rapid, weak pulse (more than 100 beats per minute at rest).  Cold hands and feet.  Not able to sweat in spite of heat and temperature.  Rapid breathing.  Blue lips.  Confusion and lethargy.  Difficulty being awakened.  Minimal urine production.  No tears. DIAGNOSIS  Your caregiver will diagnose dehydration based on your symptoms and your exam. Blood and urine tests will help confirm the diagnosis. The diagnostic evaluation should also identify the cause of dehydration. TREATMENT  Treatment of mild or moderate dehydration can often be done at home by increasing the amount of fluids that you drink. It is best to drink small amounts of fluid more often. Drinking too much at one time can make vomiting worse. Refer to the home care instructions below. Severe dehydration needs to be treated at the hospital where you will probably be given intravenous (IV) fluids that contain water and electrolytes. HOME CARE INSTRUCTIONS   Ask your caregiver about specific rehydration instructions.  Drink enough fluids to keep your urine clear or pale yellow.  Drink small amounts frequently if you have nausea and vomiting.  Eat as you normally do.  Avoid:  Foods or drinks high in sugar.  Carbonated  drinks.  Juice.  Extremely hot or cold fluids.  Drinks with caffeine.  Fatty, greasy foods.  Alcohol.  Tobacco.  Overeating.  Gelatin desserts.  Wash your hands well to avoid spreading bacteria and viruses.  Only take over-the-counter or prescription medicines for pain, discomfort, or fever as directed by your caregiver.  Ask your caregiver if you should continue all prescribed and over-the-counter medicines.  Keep all follow-up appointments with your caregiver. SEEK MEDICAL CARE IF:  You have abdominal pain and it increases or stays in one area (localizes).  You have a rash, stiff neck, or severe headache.  You are irritable, sleepy, or difficult to awaken.  You are weak, dizzy, or extremely thirsty. SEEK IMMEDIATE MEDICAL CARE IF:   You are unable to keep fluids down or you get worse despite treatment.  You have frequent episodes of vomiting or diarrhea.  You have blood or green matter (bile) in your vomit.  You have blood in your stool or your stool looks black and tarry.  You have not urinated in 6 to 8 hours, or you have only urinated a small amount of very dark urine.  You have a fever.  You faint. MAKE SURE YOU:   Understand these instructions.  Will watch your condition.  Will get help right away if you are not doing well or get worse. Document Released: 04/01/2005 Document Revised: 06/24/2011 Document Reviewed: 11/19/2010 ExitCare Patient Information 2015 [employer]. This information is not intended to replace advice given to you by your health care provider. Make sure you discuss any questions you have with your health care   provider.  

## 2014-02-03 NOTE — Progress Notes (Signed)
Radiation Oncology         [phone]) [phone] ________________________________  Initial outpatient Consultation  Name: Sherri Stafford MRN: [id number]  Date: 02/03/2014  DOB: 06/02/1950  HE:NIDPOE,UMPNTI OLIVER, MD  Curt Bears, MD   REFERRING PHYSICIAN: Curt Bears, MD  DIAGNOSIS: stage IV (T2a, N3, M1b) non-small cell lung cancer, adenocarcinoma presented with large left lower lobe lung mass in addition to bilateral pulmonary nodules and mediastinal and supraclavicular lymphadenopathy diagnosed in May of 2015.   HISTORY OF PRESENT ILLNESS::Sherri Stafford is a 63 y.o. female who is seen out courtesy of Dr. Julien Nordmann for an opinion concerning radiation therapy as part of management of patient's stage IV non-small cell lung cancer. The patient was diagnosed with advanced non-small cell lung cancer in May of 2015. She has been treated at [facility] up until recently. [facility] issues.  the patient has been recently admitted and discharged from the hospital for admission for chest pain and shortness of breath. She is found to have a pulmonary embolus and underwent anticoagulation therapy for this issue. Despite anticoagulation the patient has continued to have significant coughing which is likely related to her extensive mediastinal adenopathy with airway compression  along the left mainstem bronchus and smaller airway branches. She has also had pain in the lower back, right pelvis and right arm and shoulder area. Patient is now seen in radiation oncology for consideration for additional palliative treatments. Patient is scheduled for IV fluid supplementation later today. She is also scheduled for MRI the brain at [facility] for followup of her [facility] treatment.  PREVIOUS RADIATION THERAPY: Yes, performed at [facility]; left pelvis AP PA 20 gray in 5 fractions treatments in June of this year. Sacrum AP PA 20 gray in 5 fractions, treatment performed in  June of this year; Jenkins treatment to the left cerebellar lesion , 20 gray in one treatment, completed in late September of 2015   PAST MEDICAL HISTORY:  has a past medical history of Arthritis; Bilateral ovarian cysts; GERD (gastroesophageal reflux disease); Strain of hip flexor (06/2013); Cancer; Bone metastases; and Radiation.    PAST SURGICAL HISTORY: Past Surgical History  Procedure Laterality Date  . Dilation and curettage of uterus  2004    x 3   . Tubal ligation    . Colonoscopy  04/05/2004    normal     FAMILY HISTORY: family history includes Brain cancer in her father; Breast cancer (age of onset: 56) in her paternal aunt; Emphysema in her father; Pancreatic cancer in her mother. There is no history of Colon cancer.  SOCIAL HISTORY:  reports that she quit smoking about 29 years ago. Her smoking use included Cigarettes. She has a 14 pack-year smoking history. She has never used smokeless tobacco. She reports that she does not drink alcohol or use illicit drugs.  ALLERGIES: Other; Ketoconazole; Lorazepam; Morphine and related; Nystatin; Sporanox; Tylenol; Vitamin d; and Zofran  MEDICATIONS:  Current Outpatient Prescriptions  Medication Sig Dispense Refill  . albuterol (PROVENTIL HFA;VENTOLIN HFA) 108 (90 BASE) MCG/ACT inhaler Inhale 1-2 puffs into the lungs every 6 (six) hours as needed for wheezing or shortness of breath.  1 Inhaler  2  . bisacodyl (DULCOLAX) 10 MG suppository Place 1 suppository (10 mg total) rectally daily as needed for moderate constipation.  10 suppository  0  . calcium carbonate (OS-CAL) 1250 MG chewable tablet Chew 4 tablets by mouth daily.      Marland Kitchen  docusate sodium (COLACE) 100 MG capsule Take 100 mg by mouth 2 (two) times daily.      Marland Kitchen enoxaparin (LOVENOX) 100 MG/ML injection Inject 0.9 mLs (90 mg total) into the skin daily.  30 Syringe  0  . esomeprazole (NEXIUM) 40 MG capsule Take 1 capsule (40 mg total) by mouth 2 (two) times daily before a meal.  60 capsule   0  . fentaNYL (DURAGESIC - DOSED MCG/HR) 50 MCG/HR Place 1 patch (50 mcg total) onto the skin every 3 (three) days.  5 patch  0  . polyethylene glycol (MIRALAX / GLYCOLAX) packet Take 17 g by mouth daily as needed for moderate constipation.      . polyvinyl alcohol (LIQUIFILM TEARS) 1.4 % ophthalmic solution Place 1 drop into both eyes as needed for dry eyes.       Marland Kitchen prochlorperazine (COMPAZINE) 10 MG tablet Take 1 tablet (10 mg total) by mouth every 6 (six) hours as needed for nausea or vomiting.  30 tablet  0  . senna (SENOKOT) 8.6 MG tablet Take 2 tablets by mouth 2 (two) times daily.       . traMADol (ULTRAM) 50 MG tablet Every 6-8 hours (per patient)      . codeine 30 MG tablet Take 1 tablet (30 mg total) by mouth every 4 (four) hours as needed (cough).  80 tablet  0  . gabapentin (NEURONTIN) 600 MG tablet Take 600 mg by mouth 3 (three) times daily.      Marland Kitchen glycerin adult 2 G SUPP Place 1 suppository rectally once as needed for moderate constipation.      Marland Kitchen levofloxacin (LEVAQUIN) 500 MG tablet Take 1 tablet (500 mg total) by mouth daily.  3 tablet  0  . loratadine (CLARITIN) 10 MG tablet Take 1 tablet (10 mg total) by mouth daily.  15 tablet  0  . methylPREDNIsolone (MEDROL DOSPACK) 4 MG tablet follow package directions  21 tablet  0  . OLANZapine (ZYPREXA) 10 MG tablet        No current facility-administered medications for this encounter.   Facility-Administered Medications Ordered in Other Encounters  Medication Dose Route Frequency Provider Last Rate Last Dose  . 0.9 %  sodium chloride infusion   Intravenous Continuous Drue Second, NP        REVIEW OF SYSTEMS:  A 15 point review of systems is documented in the electronic medical record. This was obtained by the nursing staff. However, I reviewed this with the patient to discuss relevant findings and make appropriate changes. The most bothersome issue for the patient is excessive dry coughing which she comes short of breath and  actually causes nausea. She denies any hemoptysis. Patient's low back pain is the second most significant complaint today. Her pain in the right arm and shoulder and right pelvis are third  in terms of her symptom priority issues. Patient denies any headaches or visual changes. She denies any focal motor weakness. She has chronic problems with constipation related to her narcotics   PHYSICAL EXAM:  height is 5\' 3"  (1.6 m) and weight is 129 lb 3.2 oz (58.605 kg). Her oral temperature is 98.4 F (36.9 C). Her blood pressure is 103/77 and her pulse is 124. Her respiration is 20 and oxygen saturation is 100%.   BP 103/77  Pulse 124  Temp(Src) 98.4 F (36.9 C) (Oral)  Resp 20  Ht 5\' 3"  (1.6 m)  Wt 129 lb 3.2 oz (58.605 kg)  BMI 22.89 kg/m2  SpO2 100%  LMP 01/14/2008  General Appearance:    Alert, cooperative, no distress, appears stated age, accompanied by a good friend on evaluation today, patient remained in a wheelchair for most of the evaluation but was able to get up on the examination table for exam  Head:    Normocephalic, without obvious abnormality, atraumatic  Eyes:    PERRL, conjunctiva/corneas clear, EOM's intact,     Ears:    Normal TM's and external ear canals, both ears  Nose:   Nares normal, septum midline, mucosa normal, no drainage    or sinus tenderness  Throat:   Lips, mucosa, and tongue normal; teeth and gums normal  Neck:   Supple, symmetrical, trachea midline, no adenopathy;    thyroid:  no enlargement/tenderness/nodules; no carotid   bruit or JVD  Back:     Symmetric, no curvature, ROM normal, no CVA tenderness  Lungs:     Clear to auscultation bilaterally, respirations unlabored  Chest Wall:    No tenderness or deformity   Heart:    Regular rate and rhythm, S1 and S2 normal, no murmur, rub   or gallop     Abdomen:     Soft, non-tender, bowel sounds active all four quadrants,    no masses, no organomegaly        Extremities:   Extremities normal, atraumatic, no  cyanosis or edema  Pulses:   2+ and symmetric all extremities  Skin:   Skin color, texture, turgor normal, no rashes or lesions  Lymph nodes:   Cervical, supraclavicular, and axillary nodes normal  Neurologic:    normal strength, sensation and reflexes    throughout     ECOG = 2  2 - Symptomatic, <50% in bed during the day (Ambulatory and capable of all self care but unable to carry out any work activities. Up and about more than 50% of waking hours)  LABORATORY DATA:  Lab Results  Component Value Date   WBC 7.3 02/02/2014   HGB 9.0* 02/02/2014   HCT 28.2* 02/02/2014   MCV 89.5 02/02/2014   PLT 236 02/02/2014   NEUTROABS 5.4 02/02/2014   Lab Results  Component Value Date   NA 137 02/02/2014   K 3.8 02/02/2014   CL 106 01/16/2014   CO2 26 02/02/2014   GLUCOSE 114 02/02/2014   CREATININE 0.8 02/02/2014   CALCIUM 9.3 02/02/2014      RADIOGRAPHY: Dg Chest 2 View  01/14/2014   CLINICAL DATA:  63 year old female with chest pain. Status post initiation of chemotherapy.  EXAM: CHEST - 2 VIEW  COMPARISON:  Multiple prior. Most recent CT chest 11/22/2013. Most recent x-ray 11/14/2013.  FINDINGS: Cardiomediastinal silhouette within normal limits in size and contour.  Similar size number and distribution of bilateral pulmonary nodules/masses. Again dominant mass within the left lower lobe.  No confluent airspace disease. No pleural effusion. No pneumothorax.  A interval placement of double-lumen right-sided port catheter in the right internal jugular vein with the tip appearing to terminate in the right atrium.  Re- demonstration of fracture of the right ninth rib.  IMPRESSION: No radiographic evidence of acute cardiopulmonary disease.  Similar size, number, and distribution of multiple bilateral pulmonary nodules/ masses as compared to prior plain film and cross-sectional imaging.  Re- demonstration of right ninth rib fracture without significant callus formation.  Interval placement of  double-lumen port catheter on the right chest wall, with the tip appearing to terminate in the right atrium.  Signed,  Dulcy Fanny.  Earleen Newport, DO  [facility]  [facility]   Electronically Signed   By: Corrie Mckusick D.O.   On: 01/14/2014 15:22   Dg Abd 1 View  01/26/2014   CLINICAL DATA:  Abdominal pain, nausea, vomiting and constipation.  EXAM: ABDOMEN - 1 VIEW  COMPARISON:  CT abdomen and pelvis 12/31/2013. Chest and two views abdomen 11/14/2013.  FINDINGS: The bowel gas pattern is nonobstructive. Stool burden is unremarkable. No abnormal abdominal calcification is seen. Small sclerotic lesions in the right sacrum and ileum are identified, unchanged.  IMPRESSION: No acute finding.  Negative for constipation.   Electronically Signed   By: Inge Rise M.D.   On: 01/26/2014 10:22   Ct Angio Chest Pe W/cm &/or Wo Cm  01/14/2014   CLINICAL DATA:  Chest pain; history of lung malignancy currently undergoing chemotherapy ; history of sarcoidosis and remote history of tobacco use ; history of right ninth rib fracture  EXAM: CT ANGIOGRAPHY CHEST WITH CONTRAST  TECHNIQUE: Multidetector CT imaging of the chest was performed using the standard protocol during bolus administration of intravenous contrast. Multiplanar CT image reconstructions and MIPs were obtained to evaluate the vascular anatomy.  CONTRAST:  136mL OMNIPAQUE IOHEXOL 300 MG/ML  SOLN intravenously  COMPARISON:  PA and lateral chest x-ray of today's date  FINDINGS: There is bulky diffuse mediastinal lymphadenopathy. There are enlarged hilar lymph nodes greater on the left than on the right. There are large filling defects within branches of the right lower lobe pulmonary artery. Definite filling defects elsewhere in the pulmonary artery tree are not demonstrated. The RV -LV ratio is 1.35. The heart overall is not enlarged. There is no pericardial effusion. There is a right internal jugular power port catheter in  place. The thoracic aorta is unremarkable. The thyroid gland is unremarkable. The thoracic esophagus exhibits no acute abnormalities.  There are multiple bilateral soft tissue density parenchymal masses of varying sizes. The largest measures 3.3 cm in diameter. There is no alveolar pneumonia. There is no evidence of pulmonary infarction.  Within the upper abdomen there are abnormal hypodensities posteriorly in the right hepatic lobe. The partially imaged adrenal glands are normal. The spleen exhibits no acute abnormality.  Review of the MIP images confirms the above findings.  IMPRESSION: 1. There are large central filling defects in branches of the right lower lobe pulmonary artery consistent with acute pulmonary embolism. There is evidence of right ventricular strain. Positive for acute PE with CT evidence of right heartstrain (RV/LV Ratio = 1.35) consistent with at least submassive (intermediate risk)PE. The presence of right heart strain has been associated with anincreased risk of morbidity and mortality. Consultation with [place] is recommended. 2. There is bulky mediastinal and left hilar lymphadenopathy. There are numerous soft tissue density masses throughout both lungs consistent with intrapulmonary metastatic disease. 3. There is are hypodense masses in the liver worrisome for metastatic disease. 4. Critical Value/emergent results were called by telephone at the time of interpretation on 01/14/2014 at 4:35 pm to Dr. Colin Rhein, who verbally acknowledged these results.   Electronically Signed   By: David  Martinique   On: 01/14/2014 16:43   US Abdomen Limited  01/26/2014   CLINICAL DATA:  Increased bilirubin  EXAM: US ABDOMEN LIMITED - RIGHT UPPER QUADRANT  COMPARISON:  CT chest 01/14/2014, CT abdomen 12/31/2013  FINDINGS: Gallbladder:  Cholelithiasis without pericholecystic fluid or wall thickening visualized. No sonographic Murphy sign noted.  Common bile duct:  Diameter: 1.9 mm  Liver:  There is a hypoechoic masslike area in the superior posterior liver measuring 2.5 x 2.8 x 2 cm most concerning for metastatic disease when correlated with recent CT chest dated 01/14/2014. Within normal limits in parenchymal echogenicity. No intrahepatic or extrahepatic biliary ductal dilatation.  IMPRESSION: 1. There is a hypoechoic masslike area in the superior posterior liver measuring 2.5 x 2.8 x 2 cm most concerning for metastatic disease when correlated with recent CT chest dated 01/14/2014. 2. No biliary obstruction. 3. Cholelithiasis without sonographic evidence of acute cholecystitis.   Electronically Signed   By: Kathreen Devoid   On: 01/26/2014 16:06      IMPRESSION: Stage IV non-small cell lung cancer.  on recent imaging she has had progression within the central chest significant compression of the left mainstem bronchus and small airways along the left side. This is in all likelihood  contributing to her significant coughing. The patient would be a candidate for palliative radiation therapy directed at the central chest area. With further progression she may have complete collapse of her left lung in addition. Patient also has significant pain from her osseous metastasis. Her most significant pain is in the lower back but she has received prior radiation therapy at [facility] in this area and we will have to review her records concerning additional treatment to this region. The patient may require additional imaging of her right pelvis and right shoulder area if this continues to be an area of continued pain.  PLAN: Simulation early next week for treatments directed at the central chest area. I anticipate 2-3 weeks of radiation therapy directed to this area depending on esophageal tolerance issues.     ------------------------------------------------  Blair Promise, PhD, MD

## 2014-02-03 NOTE — Progress Notes (Addendum)
SYMPTOM MANAGEMENT CLINIC   HPI: Sherri Stafford 63 y.o. female diagnosed with lung cancer.  Patient is status post 2 cycles of nivolumab immunotherapy.  She also received Xgeva on a monthly basis for her previously diagnosed bone metastasis.     Patient presented to the [facility] today for continued complaint of chronic cough, occasional wheeze, and continued mild shortness of breath.  Patient states that her cough is dry/nonproductive; but she does occasionally wheeze.  She continues to complain of some increased shortness of breath with physical exertion.  She has been on long-term Levaquin for almost 2 weeks per her permit care physician for treatment of bronchitis; but with minimal effectiveness/improvement.  She has tried albuterol inhalers as well with no effectiveness.  She continues to deny any hemoptysis.  She also denies any nausea, vomiting, diarrhea, or constipation now.  She does continue to complain of some chronic pain to her lower back and occasionally to her abdomen.  I she states that the Fentanyl patch that was recently added has greatly improved her chronic pain issues.  She is questioning if increase in the fentanyl patch will help with her chronic cough and chronic back pain.  She also takes tramadol on intermittent basis for breakthrough pain.  She states that she occasionally takes coating on her cough becomes uncontrollable.    Patient states that she is no longer suffering with any constipation issues; so she is on a strict bowel regimen which includes stool softeners and laxatives on a daily basis.  There patient feels she is fairly dehydrated today; his requested IV fluid rehydration either this afternoon or tomorrow afternoon.  Patient was diagnosed with pulmonary embolism at the beginning of October 2015; and continues on Lovenox injections on a daily basis.  Paragraph patient reports she has her initial home evaluation for home health and physical therapy in the  morning.  She also has her initial radiation oncologist consultation with Dr. Sondra Come tomorrow afternoon.  HPI  ROS  Past Medical History  Diagnosis Date  . Arthritis   . Bilateral ovarian cysts   . GERD (gastroesophageal reflux disease)   . Strain of hip flexor 06/2013    left side torn  . Cancer     lung ca    Past Surgical History  Procedure Laterality Date  . Dilation and curettage of uterus  2004    x 3   . Tubal ligation    . Colonoscopy  04/05/2004    normal     has Nonspecific abnormal results of liver function study; Cough; Sarcoidosis; Unspecified vitamin D deficiency; Dyspnea; Adenocarcinoma of lung; Chest pain; Pulmonary embolism; Acute bronchitis; Bone metastases; Abdominal pain; Nausea without vomiting; Long term current use of anticoagulant therapy; Dysuria; Dehydration; Hyperbilirubinemia; Hypoalbuminemia; Neoplasm related pain; and Anemia in neoplastic disease on her problem list.     is allergic to other; ketoconazole; lorazepam; morphine and related; nystatin; sporanox; tylenol; vitamin d; and zofran.    Medication List       This list is accurate as of: 02/02/14 11:59 PM.  Always use your most recent med list.               albuterol 108 (90 BASE) MCG/ACT inhaler  Commonly known as:  PROVENTIL HFA;VENTOLIN HFA  Inhale 1-2 puffs into the lungs every 6 (six) hours as needed for wheezing or shortness of breath.     bisacodyl 10 MG suppository  Commonly known as:  DULCOLAX  Place 1 suppository (  10 mg total) rectally daily as needed for moderate constipation.     calcium carbonate 1250 MG chewable tablet  Commonly known as:  OS-CAL  Chew 4 tablets by mouth daily.     codeine 30 MG tablet  Take 1 tablet (30 mg total) by mouth every 4 (four) hours as needed (cough).     docusate sodium 100 MG capsule  Commonly known as:  COLACE  Take 100 mg by mouth 2 (two) times daily.     enoxaparin 100 MG/ML injection  Commonly known as:  LOVENOX  Inject 0.9  mLs (90 mg total) into the skin daily.     esomeprazole 40 MG capsule  Commonly known as:  NEXIUM  Take 1 capsule (40 mg total) by mouth 2 (two) times daily before a meal.     fentaNYL 50 MCG/HR  Commonly known as:  DURAGESIC - dosed mcg/hr  Place 1 patch (50 mcg total) onto the skin every 3 (three) days.     gabapentin 600 MG tablet  Commonly known as:  NEURONTIN  Take 600 mg by mouth 3 (three) times daily.     glycerin adult 2 G Supp  Place 1 suppository rectally once as needed for moderate constipation.     levofloxacin 500 MG tablet  Commonly known as:  LEVAQUIN  Take 1 tablet (500 mg total) by mouth daily.     loratadine 10 MG tablet  Commonly known as:  CLARITIN  Take 1 tablet (10 mg total) by mouth daily.     methylPREDNIsolone 4 MG tablet  Commonly known as:  MEDROL DOSPACK  follow package directions     OLANZapine 10 MG tablet  Commonly known as:  ZYPREXA     polyethylene glycol packet  Commonly known as:  MIRALAX / GLYCOLAX  Take 17 g by mouth daily as needed for moderate constipation.     polyvinyl alcohol 1.4 % ophthalmic solution  Commonly known as:  LIQUIFILM TEARS  Place 1 drop into both eyes as needed for dry eyes.     prochlorperazine 10 MG tablet  Commonly known as:  COMPAZINE  Take 1 tablet (10 mg total) by mouth every 6 (six) hours as needed for nausea or vomiting.     senna 8.6 MG tablet  Commonly known as:  SENOKOT  Take 2 tablets by mouth 2 (two) times daily.     traMADol 50 MG tablet  Commonly known as:  ULTRAM  Every 6-8 hours (per patient)         PHYSICAL EXAMINATION  Blood pressure 103/59, pulse 127, temperature 98.4 F (36.9 C), temperature source Oral, resp. rate 18, height 5\' 3"  (1.6 m), weight 129 lb (58.514 kg), last menstrual period 01/14/2008, SpO2 100.00%.  Physical Exam  Nursing note and vitals reviewed. Constitutional: She is oriented to person, place, and time. She appears dehydrated. She appears unhealthy. She  appears cachectic.  HENT:  Head: Normocephalic and atraumatic.  Mouth/Throat: Oropharynx is clear and moist.  Eyes: Conjunctivae and EOM are normal. Pupils are equal, round, and reactive to light. No scleral icterus.  Neck: Normal range of motion. Neck supple. No JVD present. No tracheal deviation present. No thyromegaly present.  Cardiovascular: Regular rhythm, normal heart sounds and intact distal pulses.   Pulmonary/Chest: Effort normal and breath sounds normal. No respiratory distress. She has no wheezes. She has no rales. She exhibits no tenderness.  Abdominal: Soft. Bowel sounds are normal. She exhibits no distension. There is no tenderness. There is no  rebound and no guarding.  Musculoskeletal: Normal range of motion. She exhibits no edema.  Lymphadenopathy:    She has no cervical adenopathy.  Neurological: She is alert and oriented to person, place, and time.  Patient was in a wheelchair during the exam today; but was able to ambulate with assistance with minimal difficulty.  Skin: Skin is warm and dry. No rash noted. No erythema.  Psychiatric: Affect normal.    LABORATORY DATA:. Appointment on 02/02/2014  Component Date Value Ref Range Status  . WBC 02/02/2014 7.3  3.9 - 10.3 10e3/uL Final  . NEUT# 02/02/2014 5.4  1.5 - 6.5 10e3/uL Final  . HGB 02/02/2014 9.0* 11.6 - 15.9 g/dL Final  . HCT 02/02/2014 28.2* 34.8 - 46.6 % Final  . Platelets 02/02/2014 236  145 - 400 10e3/uL Final  . MCV 02/02/2014 89.5  79.5 - 101.0 fL Final  . MCH 02/02/2014 28.6  25.1 - 34.0 pg Final  . MCHC 02/02/2014 31.9  31.5 - 36.0 g/dL Final  . RBC 02/02/2014 3.15* 3.70 - 5.45 10e6/uL Final  . RDW 02/02/2014 19.0* 11.2 - 14.5 % Final  . lymph# 02/02/2014 1.1  0.9 - 3.3 10e3/uL Final  . MONO# 02/02/2014 0.5  0.1 - 0.9 10e3/uL Final  . Eosinophils Absolute 02/02/2014 0.3  0.0 - 0.5 10e3/uL Final  . Basophils Absolute 02/02/2014 0.0  0.0 - 0.1 10e3/uL Final  . NEUT% 02/02/2014 74.4  38.4 - 76.8 % Final    . LYMPH% 02/02/2014 14.8  14.0 - 49.7 % Final  . MONO% 02/02/2014 6.8  0.0 - 14.0 % Final  . EOS% 02/02/2014 3.7  0.0 - 7.0 % Final  . BASO% 02/02/2014 0.3  0.0 - 2.0 % Final  . Sodium 02/02/2014 137  136 - 145 mEq/L Final  . Potassium 02/02/2014 3.8  3.5 - 5.1 mEq/L Final  . Chloride 02/02/2014 100  98 - 109 mEq/L Final  . CO2 02/02/2014 26  22 - 29 mEq/L Final  . Glucose 02/02/2014 114  70 - 140 mg/dl Final  . BUN 02/02/2014 15.0  7.0 - 26.0 mg/dL Final  . Creatinine 02/02/2014 0.8  0.6 - 1.1 mg/dL Final  . Total Bilirubin 02/02/2014 1.36* 0.20 - 1.20 mg/dL Final  . Alkaline Phosphatase 02/02/2014 55  40 - 150 U/L Final  . AST 02/02/2014 16  5 - 34 U/L Final  . ALT 02/02/2014 15  0 - 55 U/L Final  . Total Protein 02/02/2014 6.3* 6.4 - 8.3 g/dL Final  . Albumin 02/02/2014 2.9* 3.5 - 5.0 g/dL Final  . Calcium 02/02/2014 9.3  8.4 - 10.4 mg/dL Final  . Anion Gap 02/02/2014 10  3 - 11 mEq/L Final     RADIOGRAPHIC STUDIES: No results found.  ASSESSMENT/PLAN:    Adenocarcinoma of lung  Assessment & Plan Patient received cycle 2 of her nivolumab immunotherapy on 01/27/2014.  She will be due for cycle 3 of the same regimen on 02/10/2014.  Patient has been on prolonged Levaquin therapy for presumed bronchitis per her primary care physician; with minimal effectiveness.  Most likely, patient's chronic cough is related to her lung cancer.  Patient has also tried albuterol inhalers with no improvement.  She continues to complain of some mild, chronic shortness of breath with exertion.  Did check of patient's oxygen saturation at rest and when walking down the hall; and the lowest oxygen saturation reading was 97% on room air.  Patient did not appear in acute distress today on exam.  Hopefully, the nivolumab therapy will help decrease the symptoms.  Also, patient has an initial consult with radiation oncologist Dr. Sondra Come scheduled for tomorrow.   Bone metastases  Assessment & Plan Patient  continues to receive Xgeva on a monthly basis for her previously diagnosed bone metastasis.  She last received the Xgeva infusion on 01/27/2014; and will be due for her next cycle of Xgeva in one month.   Long term current use of anticoagulant therapy  Assessment & Plan Patient has been diagnosed recently with a pulmonary embolism; and continues on Lovenox injections on a daily basis.   Dehydration  Assessment & Plan Patient feels that she is slightly dehydrated; his requested IV fluid rehydration.  She states that she would prefer to hold IV fluids until tomorrow afternoon following her radiation oncology consult.  Appointment was made and orders were written for IV fluid rehydration tomorrow afternoon.   Hyperbilirubinemia  Assessment & Plan Bilirubin has improved from 1.51 down to 1.36.  Will continue to monitor.   Hypoalbuminemia  Assessment & Plan Patient continues with low albumin.  Patient was encouraged to push protein in her diet is much as possible.   Neoplasm related pain  Assessment & Plan Patient continues to complain of some chronic low back and occasional abdominal discomfort.  Her pain has been much better under control what she initiated the Fentanyl patch.  However, she does continue to complain of some mild pain on a regular basis.  I will try patient on the increased Fentanyl patch at 50 mcg.  Patient also takes a tramadol on intermittent basis for breakthrough pain.  She also states she occasionally takes codeine for her cough as well.  Patient has been very concerned regarding the possibility of opioid-induced constipation; and has been on a strict bowel regimen to prevent any constipation issues recently.     Anemia in neoplastic disease  Assessment & Plan Patient is a slightly more anemic today with hemoglobin down from 9.9-9.0.  Will continue to monitor closely.   Patient stated understanding of all instructions; and was in agreement with this plan of  care. The patient knows to call the clinic with any problems, questions or concerns.    This was a shared visit with Dr. Julien Nordmann today.    Total time spent with patient was  25  minutes;  with greater than  75  percent of that time spent in face to face counseling regarding  her symptoms,  and coordination of care and follow up.  Disclaimer: This note was dictated with voice recognition software. Similar sounding words can inadvertently be transcribed and may not be corrected upon review.   Drue Second, NP 02/03/2014   ADDENDUM: Hematology/Oncology Attending: I had a face to face encounter with the patient. I recommended her care plan. This is a very pleasant 63 years old white female with metastatic non-small cell lung cancer who had disease progression on the induction chemotherapy with carboplatin and Alimta and she is currently receiving immunotherapy with Nivolumab status post 2 cycles. She is tolerating her treatment well but the patient continues to complain of increasing fatigue and weakness as well as chronic cough with weakness and shortness of breath. She also has chronic back pain and she was started recently on fentanyl patch 25 mcg/hour every 3 days and tolerated it fairly well and her pain is better controlled. I would consider increasing her fentanyl patch to 50 mcg. She was advised about a good bowel regimen to avoid narcotics induced  constipation. She was also referred to Dr. Sondra Come for consideration of palliative radiotherapy to the back. She dehydrated today and will arrange for the patient to receive IV fluid.  The patient will continue with her current treatment with immunotherapy as scheduled. She was advised to call immediately if she has any concerning symptoms in the interval.  Disclaimer: This note was dictated with voice recognition software. Similar sounding words can inadvertently be transcribed and may be missed upon review. Eilleen Kempf., MD 02/04/2014

## 2014-02-03 NOTE — Progress Notes (Signed)
Op Note - Marca Ancona, MD - 11/17/2013 5:52 PM EDT  Operative Report Vanita Panda [id number]     [place] procedure   DATE OF PROCEDURE: 11/17/2013  PROCEDURE: Stereotactic Radiosurgery  PREOPERATIVE DIAGNOSIS: Brain Metastasis from [place] DIAGNOSIS: Brain Metastasis from [place]  SURGEON: Melonie Florida, MD, PhD  ASSISTED BY: Michael Boston, MD  NARRATIVE: The plan for stereotactic radiosurgery, including risks, benefits, side effects, limitations, and logistics, was reviewed at length with the patient; she freely elected to proceed. On 11/10/13, a custom U-frame mask was fashioned, a head CT and contrast-enhanced brain MRI performed, the CT and MR images fused, and the lesion volume and normal tissue structures contoured on a slice-by-slice basis.   A variety of stereotactic radiosurgery plans were generated and, following critical review by Drs. Leonel Ramsay and Barclay, the optimal plan described below was selected.   For the left cerebellar metastasis, a 4 dynamic conformal arc radiosurgery plan, utilizing the high-definition, micro-multileaf collimator, was generated. 2000 cGy was prescribed to the 100% isodose line (maximum dose 22.5 Gy, 99.9% coverage), yielding a conformity index of 1.9.   After scrupulous quality assurance, which included image guidance via cone-beam CT, the patient underwent stereotactic radiosurgery, which she tolerated well. The patient was discharged home in good condition on 4 mg of dexamethasone, which she is taking daily for bone pain recommended by Dr. Joylene Draft. She will return to our clinic in 3 months with a brain MRI, following up in the interim with her other care providers, as scheduled.   Michael Boston, M.D. Radiation Oncology PGY-3  I, Melonie Florida, MD, PhD, planned the patient, as described above, and was present for this radiosurgery, verifying correct placement of the isocenter.  CC: Joya Martyr,  MD Bobbe Medico, NP Lonny Prude, MD, PhD

## 2014-02-03 NOTE — Assessment & Plan Note (Signed)
Patient feels that she is slightly dehydrated; his requested IV fluid rehydration.  She states that she would prefer to hold IV fluids until tomorrow afternoon following her radiation oncology consult.  Appointment was made and orders were written for IV fluid rehydration tomorrow afternoon.

## 2014-02-03 NOTE — Assessment & Plan Note (Signed)
Patient has been diagnosed recently with a pulmonary embolism; and continues on Lovenox injections on a daily basis.

## 2014-02-04 MED ORDER — SODIUM CHLORIDE 0.9 % IJ SOLN
10.0000 mL | INTRAMUSCULAR | Status: DC | PRN
Start: 2014-02-04 — End: 2014-02-04
  Administered 2014-02-03: 10 mL via INTRAVENOUS
  Filled 2014-02-04: qty 10

## 2014-02-04 MED ORDER — HEPARIN SOD (PORK) LOCK FLUSH 100 UNIT/ML IV SOLN
500.0000 [IU] | Freq: Once | INTRAVENOUS | Status: AC
  Administered 2014-02-03: 500 [IU] via INTRAVENOUS
  Filled 2014-02-04: qty 5

## 2014-02-04 NOTE — Addendum Note (Signed)
Encounter addended by: Jacqulyn Liner, RN on: 02/04/2014  9:17 AM<BR>     Documentation filed: Visit Diagnoses, Charges VN

## 2014-02-07 ENCOUNTER — Other Ambulatory Visit: Payer: Self-pay | Admitting: Internal Medicine

## 2014-02-07 ENCOUNTER — Other Ambulatory Visit: Payer: Self-pay | Admitting: Medical Oncology

## 2014-02-07 ENCOUNTER — Telehealth: Payer: Self-pay | Admitting: Medical Oncology

## 2014-02-07 ENCOUNTER — Telehealth: Payer: Self-pay | Admitting: Internal Medicine

## 2014-02-07 DIAGNOSIS — E860 Dehydration: Secondary | ICD-10-CM

## 2014-02-07 NOTE — Telephone Encounter (Signed)
requests IVF today or tomorrow. She states she is not drinking very much . Appt requested for IVF tomorrow after XRT.

## 2014-02-07 NOTE — Telephone Encounter (Signed)
added lab to 10/27 IVF's. s/w pt re appts for 10/27 @ 1:45pm. pt still has radonc @ 10am. no other orders.

## 2014-02-08 ENCOUNTER — Other Ambulatory Visit: Payer: BC Managed Care – PPO

## 2014-02-08 ENCOUNTER — Ambulatory Visit (HOSPITAL_BASED_OUTPATIENT_CLINIC_OR_DEPARTMENT_OTHER): Payer: BC Managed Care – PPO

## 2014-02-08 ENCOUNTER — Ambulatory Visit
Admission: RE | Admit: 2014-02-08 | Discharge: 2014-02-08 | Disposition: A | Discharge status: Home / Self Care | Payer: BC Managed Care – PPO | Source: Ambulatory Visit | Attending: Radiation Oncology | Admitting: Radiation Oncology

## 2014-02-08 VITALS — BP 117/58 | HR 103 | Temp 97.7°F | Resp 18

## 2014-02-08 DIAGNOSIS — E860 Dehydration: Secondary | ICD-10-CM

## 2014-02-08 DIAGNOSIS — Z510 Encounter for antineoplastic radiation therapy: Secondary | ICD-10-CM | POA: Diagnosis not present

## 2014-02-08 DIAGNOSIS — C3492 Malignant neoplasm of unspecified part of left bronchus or lung: Secondary | ICD-10-CM

## 2014-02-08 MED ORDER — HEPARIN SOD (PORK) LOCK FLUSH 100 UNIT/ML IV SOLN
500.0000 [IU] | Freq: Once | INTRAVENOUS | Status: AC
  Administered 2014-02-08: 500 [IU] via INTRAVENOUS
  Filled 2014-02-08: qty 5

## 2014-02-08 MED ORDER — PROCHLORPERAZINE MALEATE 10 MG PO TABS
ORAL_TABLET | ORAL | Status: AC
  Filled 2014-02-08: qty 1

## 2014-02-08 MED ORDER — PROCHLORPERAZINE MALEATE 10 MG PO TABS
10.0000 mg | ORAL_TABLET | Freq: Four times a day (QID) | ORAL | Status: DC | PRN
  Administered 2014-02-08: 10 mg via ORAL

## 2014-02-08 MED ORDER — SODIUM CHLORIDE 0.9 % IJ SOLN
10.0000 mL | INTRAMUSCULAR | Status: DC | PRN
  Administered 2014-02-08: 10 mL via INTRAVENOUS
  Filled 2014-02-08: qty 10

## 2014-02-08 MED ORDER — SODIUM CHLORIDE 0.9 % IV SOLN
INTRAVENOUS | Status: DC
  Administered 2014-02-08: 14:00:00 via INTRAVENOUS

## 2014-02-08 NOTE — Progress Notes (Signed)
  Radiation Oncology         [phone] ________________________________  Name: Sherri Stafford MRN: [id number]  Date: 02/08/2014  DOB: June 05, 1950  SIMULATION AND TREATMENT PLANNING NOTE    ICD-9-CM ICD-10-CM  1. Adenocarcinoma of lung, left 162.9 C34.92    DIAGNOSIS:  stage IV (T2a, N3, M1b) non-small cell lung cancer, adenocarcinoma    NARRATIVE:  The patient was brought to the [facility].  Identity was confirmed.  All relevant records and images related to the planned course of therapy were reviewed.  The patient freely provided informed written consent to proceed with treatment after reviewing the details related to the planned course of therapy. The consent form was witnessed and verified by the simulation staff.  Then, the patient was set-up in a stable reproducible  supine position for radiation therapy.  CT images were obtained.  Surface markings were placed.  The CT images were loaded into the planning software.  Then the target and avoidance structures were contoured.  Treatment planning then occurred.  The radiation prescription was entered and confirmed.  Then, I designed and supervised the construction of a total of 3 medically necessary complex treatment devices.  I have requested : 3D Simulation  I have requested a DVH of the following structures: heart lungs, spinal cord, esophagus.  I have ordered:dose calc.  PLAN:  The patient will receive 35 Gy in 14 fractions.  ________________________________  -----------------------------------  Blair Promise, PhD, MD

## 2014-02-09 ENCOUNTER — Telehealth: Payer: Self-pay | Admitting: Medical Oncology

## 2014-02-09 DIAGNOSIS — Z510 Encounter for antineoplastic radiation therapy: Secondary | ICD-10-CM | POA: Diagnosis not present

## 2014-02-09 NOTE — Telephone Encounter (Addendum)
She is concerned about side effects of radiation and stated " I need some hand holding from Dr Julien Nordmann". "Is he on board with all this ?" I explained to her that Julien Nordmann and Sondra Come work together and that she will see her radiation oncologist weekly for radiation side effects and continue to see Julien Nordmann as long as she is getting chemo .

## 2014-02-10 ENCOUNTER — Other Ambulatory Visit: Payer: Self-pay | Admitting: Medical Oncology

## 2014-02-10 ENCOUNTER — Ambulatory Visit
Admission: RE | Admit: 2014-02-10 | Discharge: 2014-02-10 | Disposition: A | Discharge status: Home / Self Care | Payer: BC Managed Care – PPO | Source: Ambulatory Visit | Attending: Radiation Oncology | Admitting: Radiation Oncology

## 2014-02-10 ENCOUNTER — Encounter: Payer: Self-pay | Admitting: Physician Assistant

## 2014-02-10 ENCOUNTER — Ambulatory Visit: Payer: BC Managed Care – PPO

## 2014-02-10 ENCOUNTER — Ambulatory Visit (HOSPITAL_BASED_OUTPATIENT_CLINIC_OR_DEPARTMENT_OTHER): Payer: BC Managed Care – PPO | Admitting: Physician Assistant

## 2014-02-10 ENCOUNTER — Other Ambulatory Visit (HOSPITAL_BASED_OUTPATIENT_CLINIC_OR_DEPARTMENT_OTHER): Payer: BC Managed Care – PPO

## 2014-02-10 ENCOUNTER — Telehealth: Payer: Self-pay | Admitting: Physician Assistant

## 2014-02-10 ENCOUNTER — Telehealth: Payer: Self-pay | Admitting: *Deleted

## 2014-02-10 ENCOUNTER — Ambulatory Visit (HOSPITAL_COMMUNITY)
Admission: RE | Admit: 2014-02-10 | Discharge: 2014-02-10 | Disposition: A | Discharge status: Home / Self Care | Payer: BC Managed Care – PPO | Source: Ambulatory Visit | Attending: Internal Medicine | Admitting: Internal Medicine

## 2014-02-10 ENCOUNTER — Ambulatory Visit (HOSPITAL_BASED_OUTPATIENT_CLINIC_OR_DEPARTMENT_OTHER): Payer: BC Managed Care – PPO

## 2014-02-10 VITALS — BP 124/84 | HR 97 | Temp 98.0°F | Resp 17

## 2014-02-10 VITALS — BP 109/65 | HR 60 | Temp 98.4°F | Resp 18 | Ht 63.0 in | Wt 131.5 lb

## 2014-02-10 DIAGNOSIS — K5900 Constipation, unspecified: Secondary | ICD-10-CM

## 2014-02-10 DIAGNOSIS — R110 Nausea: Secondary | ICD-10-CM

## 2014-02-10 DIAGNOSIS — C3432 Malignant neoplasm of lower lobe, left bronchus or lung: Secondary | ICD-10-CM

## 2014-02-10 DIAGNOSIS — R630 Anorexia: Secondary | ICD-10-CM

## 2014-02-10 DIAGNOSIS — D649 Anemia, unspecified: Secondary | ICD-10-CM | POA: Diagnosis present

## 2014-02-10 DIAGNOSIS — Z95828 Presence of other vascular implants and grafts: Secondary | ICD-10-CM

## 2014-02-10 DIAGNOSIS — C7951 Secondary malignant neoplasm of bone: Secondary | ICD-10-CM

## 2014-02-10 DIAGNOSIS — Z510 Encounter for antineoplastic radiation therapy: Secondary | ICD-10-CM | POA: Diagnosis not present

## 2014-02-10 DIAGNOSIS — C3490 Malignant neoplasm of unspecified part of unspecified bronchus or lung: Secondary | ICD-10-CM

## 2014-02-10 DIAGNOSIS — C3492 Malignant neoplasm of unspecified part of left bronchus or lung: Secondary | ICD-10-CM

## 2014-02-10 DIAGNOSIS — Z5112 Encounter for antineoplastic immunotherapy: Secondary | ICD-10-CM

## 2014-02-10 DIAGNOSIS — K649 Unspecified hemorrhoids: Secondary | ICD-10-CM

## 2014-02-10 DIAGNOSIS — C787 Secondary malignant neoplasm of liver and intrahepatic bile duct: Secondary | ICD-10-CM

## 2014-02-10 DIAGNOSIS — C7931 Secondary malignant neoplasm of brain: Secondary | ICD-10-CM

## 2014-02-10 DIAGNOSIS — R634 Abnormal weight loss: Secondary | ICD-10-CM

## 2014-02-10 DIAGNOSIS — Z79899 Other long term (current) drug therapy: Secondary | ICD-10-CM

## 2014-02-10 LAB — COMPREHENSIVE METABOLIC PANEL (CC13)
ALBUMIN: 2.3 g/dL — AB (ref 3.5–5.0)
ALT: 18 U/L (ref 0–55)
AST: 20 U/L (ref 5–34)
Alkaline Phosphatase: 62 U/L (ref 40–150)
Anion Gap: 7 mEq/L (ref 3–11)
BUN: 8.2 mg/dL (ref 7.0–26.0)
CO2: 26 mEq/L (ref 22–29)
Calcium: 8.4 mg/dL (ref 8.4–10.4)
Chloride: 99 mEq/L (ref 98–109)
Creatinine: 0.7 mg/dL (ref 0.6–1.1)
GLUCOSE: 163 mg/dL — AB (ref 70–140)
POTASSIUM: 3.8 meq/L (ref 3.5–5.1)
Sodium: 132 mEq/L — ABNORMAL LOW (ref 136–145)
Total Bilirubin: 1.12 mg/dL (ref 0.20–1.20)
Total Protein: 6.1 g/dL — ABNORMAL LOW (ref 6.4–8.3)

## 2014-02-10 LAB — CBC WITH DIFFERENTIAL/PLATELET
BASO%: 0.1 % (ref 0.0–2.0)
BASOS ABS: 0 10*3/uL (ref 0.0–0.1)
EOS ABS: 0.1 10*3/uL (ref 0.0–0.5)
EOS%: 1 % (ref 0.0–7.0)
HEMATOCRIT: 23.2 % — AB (ref 34.8–46.6)
HEMOGLOBIN: 7.4 g/dL — AB (ref 11.6–15.9)
LYMPH%: 7.3 % — ABNORMAL LOW (ref 14.0–49.7)
MCH: 27.4 pg (ref 25.1–34.0)
MCHC: 31.9 g/dL (ref 31.5–36.0)
MCV: 85.9 fL (ref 79.5–101.0)
MONO#: 0.6 10*3/uL (ref 0.1–0.9)
MONO%: 8.4 % (ref 0.0–14.0)
NEUT#: 6 10*3/uL (ref 1.5–6.5)
NEUT%: 83.2 % — ABNORMAL HIGH (ref 38.4–76.8)
PLATELETS: 274 10*3/uL (ref 145–400)
RBC: 2.7 10*6/uL — ABNORMAL LOW (ref 3.70–5.45)
RDW: 18.2 % — ABNORMAL HIGH (ref 11.2–14.5)
WBC: 7.2 10*3/uL (ref 3.9–10.3)
lymph#: 0.5 10*3/uL — ABNORMAL LOW (ref 0.9–3.3)

## 2014-02-10 LAB — PREPARE RBC (CROSSMATCH)

## 2014-02-10 MED ORDER — SODIUM CHLORIDE 0.9 % IV SOLN
3.0000 mg/kg | Freq: Once | INTRAVENOUS | Status: AC
  Administered 2014-02-10: 180 mg via INTRAVENOUS
  Filled 2014-02-10: qty 18

## 2014-02-10 MED ORDER — HEPARIN SOD (PORK) LOCK FLUSH 100 UNIT/ML IV SOLN
500.0000 [IU] | Freq: Every day | INTRAVENOUS | Status: AC | PRN
  Administered 2014-02-10: 500 [IU]
  Filled 2014-02-10: qty 5

## 2014-02-10 MED ORDER — SODIUM CHLORIDE 0.9 % IJ SOLN
10.0000 mL | INTRAMUSCULAR | Status: AC | PRN
  Administered 2014-02-10: 10 mL
  Filled 2014-02-10: qty 10

## 2014-02-10 MED ORDER — SODIUM CHLORIDE 0.9 % IV SOLN
Freq: Once | INTRAVENOUS | Status: AC
  Administered 2014-02-10: 13:00:00 via INTRAVENOUS

## 2014-02-10 MED ORDER — SODIUM CHLORIDE 0.9 % IJ SOLN
10.0000 mL | INTRAMUSCULAR | Status: DC | PRN
  Filled 2014-02-10: qty 10

## 2014-02-10 MED ORDER — DRONABINOL 2.5 MG PO CAPS: 2.5000 mg | ORAL_CAPSULE | Freq: Two times a day (BID) | ORAL | Status: DC

## 2014-02-10 MED ORDER — HEPARIN SOD (PORK) LOCK FLUSH 100 UNIT/ML IV SOLN
500.0000 [IU] | Freq: Once | INTRAVENOUS | Status: DC | PRN
  Filled 2014-02-10: qty 5

## 2014-02-10 MED ORDER — SODIUM CHLORIDE 0.9 % IJ SOLN
10.0000 mL | INTRAMUSCULAR | Status: DC | PRN
Start: 2014-02-10 — End: 2014-02-10
  Administered 2014-02-10: 10 mL via INTRAVENOUS
  Filled 2014-02-10: qty 10

## 2014-02-10 NOTE — Patient Instructions (Signed)

## 2014-02-10 NOTE — Patient Instructions (Addendum)
Sherri Stafford Discharge Instructions for Patients Receiving Chemotherapy  Today you received the following chemotherapy agents: Nivolumab  To help prevent nausea and vomiting after your treatment, we encourage you to take your nausea medication as prescribed by your physician.   If you develop nausea and vomiting that is not controlled by your nausea medication, call the clinic.   BELOW ARE SYMPTOMS THAT SHOULD BE REPORTED IMMEDIATELY:  *FEVER GREATER THAN 100.5 F  *CHILLS WITH OR WITHOUT FEVER  NAUSEA AND VOMITING THAT IS NOT CONTROLLED WITH YOUR NAUSEA MEDICATION  *UNUSUAL SHORTNESS OF BREATH  *UNUSUAL BRUISING OR BLEEDING  TENDERNESS IN MOUTH AND THROAT WITH OR WITHOUT PRESENCE OF ULCERS  *URINARY PROBLEMS  *BOWEL PROBLEMS  UNUSUAL RASH Items with * indicate a potential emergency and should be followed up as soon as possible.  Feel free to call the clinic you have any questions or concerns. The clinic phone number is [phone]. PLEASE KEEP YOUR BLUE BRACELET FOR YOUR BLOOD TRANSFUSION ON Saturday, 02/12/14  Blood Transfusion  A blood transfusion replaces your blood or some of its parts. Blood is replaced when you have lost blood because of surgery, an accident, or for severe blood conditions like anemia. You can donate blood to be used on yourself if you have a planned surgery. If you lose blood during that surgery, your own blood can be given back to you. Any blood given to you is checked to make sure it matches your blood type. Your temperature, blood pressure, and heart rate (vital signs) will be checked often.  GET HELP RIGHT AWAY IF:   You feel sick to your stomach (nauseous) or throw up (vomit).  You have watery poop (diarrhea).  You have shortness of breath or trouble breathing.  You have blood in your pee (urine) or have dark colored pee.  You have chest pain or tightness.  Your eyes or skin turn yellow (jaundice).  You have a temperature  by mouth above 102 F (38.9 C), not controlled by medicine.  You start to shake and have chills.  You develop a a red rash (hives) or feel itchy.  You develop lightheadedness or feel confused.  You develop back, joint, or muscle pain.  You do not feel hungry (lost appetite).  You feel tired, restless, or nervous.  You develop belly (abdominal) cramps. Document Released: 06/28/2008 Document Revised: 06/24/2011 Document Reviewed: 06/28/2008 [facility] Patient Information 2015 [place], [place]. This information is not intended to replace advice given to you by your health care provider. Make sure you discuss any questions you have with your health care provider.

## 2014-02-10 NOTE — Telephone Encounter (Signed)
Called [facility] for a consult visit and explanation of services.  Per Awilda Metro, PA.  [facility] stated they would call patient to set up a time to talk to her.

## 2014-02-10 NOTE — Progress Notes (Signed)
Referred pt to home health social worker.Left message on tracey's phone.

## 2014-02-10 NOTE — Progress Notes (Signed)
[facility] called for 2 units of blood.

## 2014-02-10 NOTE — Progress Notes (Signed)
  Radiation Oncology         [phone] ________________________________  Name: Sherri Stafford MRN: [id number]  Date: 02/10/2014  DOB: [birth year]  Simulation Verification Note    ICD-9-CM ICD-10-CM  1. Adenocarcinoma of lung, left 162.9 C34.92    Status: outpatient  NARRATIVE: The patient was brought to the treatment unit and placed in the planned treatment position. The clinical setup was verified. Then port films were obtained and uploaded to the radiation oncology medical record software.  The treatment beams were carefully compared against the planned radiation fields. The position location and shape of the radiation fields was reviewed. They targeted volume of tissue appears to be appropriately covered by the radiation beams. Organs at risk appear to be excluded as planned.  Based on my personal review, I approved the simulation verification. The patient's treatment will proceed as planned.  -----------------------------------  Blair Promise, PhD, MD

## 2014-02-10 NOTE — Progress Notes (Signed)
Blood infusing, rad tech here for patient's rad tx. RN in rad/onc to call with questions. tx should be 20 mins, then patient is to return.

## 2014-02-10 NOTE — Progress Notes (Addendum)
[place] [phone]   [phone]  SHARED VISIT PROGRESS NOTE  [name], MD [place] [address]  DIAGNOSIS: stage IV (T2a, N3, M1b) non-small cell lung cancer, adenocarcinoma presented with large left lower lobe lung mass in addition to bilateral pulmonary nodules and mediastinal and supraclavicular lymphadenopathy diagnosed in May of 2015.  Genomic Alterations Identified? ERBB3 amplification CDK4 amplification IDH2 R172S KRAS G12D MDM2 amplification RBM10 [id number] TERC amplification - equivocal? Additional Disease-relevant Genes with No Reportable Alterations Identified? RET ALK BRAF ERBB2 MET EGFR  PRIOR THERAPY:  1) status post palliative [facility] to the left [facility]  status post stereotactic radiotherapy to brain lesions [facility] the care [facility] 2) Systemic chemotherapy with carboplatin for AUC of 5, Alimta 500 mg/M2 and Avastin 15 mg/KG every 3 weeks, status post 5 cycles. [facility]   CURRENT THERAPY:  1) immunotherapy with Nivolmab at 3 mg/kg given every 3 weeks. Status post 2 cycles  2) Xgeva 120 mcg subcutaneously every 2 months.  INTERVAL HISTORY: Sherri CHISHOLM63y.o. female returns to the clinic today for followup visit accompanied by a friend. The patient was seen for initial evaluation at the multidisciplinary thoracic oncology clinic on 09/01/2013 after she was diagnosed with metastatic non-small cell lung cancer. [facility] at [facility] She was seen by Dr. DOretha Capriceand was started on treatment with carboplatin, Alimta and Avastin status post 4 cycles. Repeat CT scan of the chest, abdomen and pelvis after  cycle #3 on 11/22/2013 showed multiple bilateral pulmonary nodules and masses compatible with metastatic disease and compared to the previous scan on 08/12/2013 many of these nodule had slightly decreased in size. There was also mediastinal and bilateral hilar lymphadenopathy slightly decreased compared to the prior scan. The left hilar and mediastinal lymph nodes remain similar in size to the prior study. There is a new fracture of the right ninth rib suspicious for a pathologic fracture. CT scan of the abdomen and pelvis on the same day showed interval increase in size of 2 out of the 3 hypodense lesions within the liver concerning for worsening metastatic disease. The lesion within the right hepatic lobe measured 1.7 x 1.6 CM compared to 1.2 x 1.1 CM on the previous scan. An additional right hepatic lesion measured 1.2 x 0.9 CM compared to 0.8 x 0.7 CM and a lesion within the left hepatic lobe measured 0.9 x 0.6 CM compared to 0.9 x 0.5 CM previously. There was no evidence for new hepatic lesion. Is unchanged lytic lesions within the L3 vertebral body and pelvis consistent with osseous metastatic disease. Dr. DOretha Capricerecommended for the patient to continue her current systemic chemotherapy with carboplatin, Alimta and Avastin. She received cycle #4 on 11/22/2013.  [facility]. She received cycle #5 on 12/16/2013 [place] scan of the chest, abdomen and pelvis and presents revealed evidence for disease progression. She is currently being treated with immunotherapy with Nivolumab status post 2 cycles. She denies any change in her baseline shortness of breath, denies skin rash or diarrhea. She complains of having no appetite and  continues to have nausea and vomiting and dry heaves. She has numbness and tingling in her hands and feet. She reports that her appetite was better after a Medrol Dosepak when  she was receiving her previous chemotherapy. She does report the numbness and tingling occurs first in the morning and dissipates the day goes on. She is not eating or really drinking any fluids and complains of feeling weak. She just was not making any effort to eat or drink.   Her performance status has continued to decline recently.   MEDICAL HISTORY: Past Medical History  Diagnosis Date  . Arthritis   . Bilateral ovarian cysts   . GERD (gastroesophageal reflux disease)   . Strain of hip flexor 06/2013    left side torn  . Cancer     lung ca  . Bone metastases     to left hip and spine  . Radiation     at [facility] to left hip, sacrum and brain    ALLERGIES:  is allergic to other; ketoconazole; lorazepam; morphine and related; nystatin; sporanox; tylenol; vitamin d; and zofran.  MEDICATIONS:  Current Outpatient Prescriptions  Medication Sig Dispense Refill  . docusate sodium (COLACE) 100 MG capsule Take 100 mg by mouth 2 (two) times daily.      Marland Kitchen enoxaparin (LOVENOX) 100 MG/ML injection INJECT 0.9ML SUBCUTANEOUSLY EVERY DAY FOR 30 DAYS  30 Syringe  0  . esomeprazole (NEXIUM) 40 MG capsule Take 1 capsule (40 mg total) by mouth 2 (two) times daily before a meal.  60 capsule  0  . fentaNYL (DURAGESIC - DOSED MCG/HR) 50 MCG/HR Place 1 patch (50 mcg total) onto the skin every 3 (three) days.  5 patch  0  . gabapentin (NEURONTIN) 600 MG tablet Take 600 mg by mouth 3 (three) times daily.      . polyethylene glycol (MIRALAX / GLYCOLAX) packet Take 17 g by mouth daily as needed for moderate constipation.      . polyvinyl alcohol (LIQUIFILM TEARS) 1.4 % ophthalmic solution Place 1 drop into both eyes as needed for dry eyes.       Marland Kitchen prochlorperazine (COMPAZINE) 10 MG tablet Take 1 tablet (10 mg total) by mouth every 6 (six) hours as needed for nausea or vomiting.  30 tablet  0  . senna (SENOKOT) 8.6 MG tablet Take 2 tablets by mouth 2 (two) times daily.       . traMADol (ULTRAM) 50 MG tablet  Every 6-8 hours (per patient)      . albuterol (PROVENTIL HFA;VENTOLIN HFA) 108 (90 BASE) MCG/ACT inhaler Inhale 1-2 puffs into the lungs every 6 (six) hours as needed for wheezing or shortness of breath.  1 Inhaler  2  . bisacodyl (DULCOLAX) 10 MG suppository Place 1 suppository (10 mg total) rectally daily as needed for moderate constipation.  10 suppository  0  . calcium carbonate (OS-CAL) 1250 MG chewable tablet Chew 4 tablets by mouth daily.      . codeine 30 MG tablet Take 1 tablet (30 mg total) by mouth every 4 (four) hours as needed (cough).  80 tablet  0  . dronabinol (MARINOL) 2.5 MG capsule Take 1 capsule (2.5 mg total) by mouth 2 (two) times daily before a meal.  60 capsule  0  . glycerin adult 2 G SUPP Place 1 suppository rectally once as needed for moderate constipation.      Marland Kitchen levofloxacin (LEVAQUIN) 500 MG tablet Take 1 tablet (500 mg total) by mouth daily.  3 tablet  0  . loratadine (CLARITIN) 10 MG tablet Take 1 tablet (10 mg total) by mouth daily.  15 tablet  0  . methylPREDNIsolone (MEDROL DOSPACK) 4 MG tablet follow package directions  21 tablet  0  . OLANZapine (ZYPREXA) 10 MG tablet        No current facility-administered medications for this visit.   Facility-Administered Medications Ordered in Other Visits  Medication Dose Route Frequency Provider Last Rate Last Dose  . heparin lock flush 100 unit/mL  500 Units Intracatheter Once PRN Curt Bears, MD      . nivolumab (OPDIVO) 180 mg in sodium chloride 0.9 % 100 mL chemo infusion  3 mg/kg (Treatment Plan Actual) Intravenous Once Curt Bears, MD 118 mL/hr at 02/10/14 1418 180 mg at 02/10/14 1418  . sodium chloride 0.9 % injection 10 mL  10 mL Intracatheter PRN Curt Bears, MD        SURGICAL HISTORY:  Past Surgical History  Procedure Laterality Date  . Dilation and curettage of uterus  2004    x 3   . Tubal ligation    . Colonoscopy  04/05/2004    normal     REVIEW OF SYSTEMS:  Constitutional: positive  for anorexia, fatigue and weight loss Eyes: negative Ears, nose, mouth, throat, and face: negative Respiratory: positive for cough and dyspnea on exertion Cardiovascular: negative Gastrointestinal: positive for constipation and nausea Genitourinary:negative Integument/breast: negative Hematologic/lymphatic: negative Musculoskeletal:positive for muscle weakness Neurological: negative Behavioral/Psych: negative Endocrine: negative Allergic/Immunologic: negative   PHYSICAL EXAMINATION: General appearance: alert, cooperative, fatigued and no distress Head: Normocephalic, without obvious abnormality, atraumatic Neck: no adenopathy, no JVD, supple, symmetrical, trachea midline and thyroid not enlarged, symmetric, no tenderness/mass/nodules Lymph nodes: Cervical, supraclavicular, and axillary nodes normal. Resp: clear to auscultation bilaterally Back: symmetric, no curvature. ROM normal. No CVA tenderness. Cardio: regular rate and rhythm, S1, S2 normal, no murmur, click, rub or gallop GI: soft, non-tender; bowel sounds normal; no masses,  no organomegaly Extremities: extremities normal, atraumatic, no cyanosis or edema Neurologic: Alert and oriented X 3, normal strength and tone. Normal symmetric reflexes. Normal coordination and gait  ECOG PERFORMANCE STATUS: 2 - Symptomatic, <50% confined to bed  Blood pressure 109/65, pulse 60, temperature 98.4 F (36.9 C), temperature source Oral, resp. rate 18, height '5\' 3"'  (1.6 m), weight 131 lb 8 oz (59.648 kg), last menstrual period 01/14/2008, SpO2 98.00%.  LABORATORY DATA: Lab Results  Component Value Date   WBC 7.2 02/10/2014   HGB 7.4* 02/10/2014   HCT 23.2* 02/10/2014   MCV 85.9 02/10/2014   PLT 274 02/10/2014      Chemistry      Component Value Date/Time   NA 132* 02/10/2014 1140   NA 141 01/16/2014 0530   K 3.8 02/10/2014 1140   K 3.5* 01/16/2014 0530   CL 106 01/16/2014 0530   CO2 26 02/10/2014 1140   CO2 24 01/16/2014 0530    BUN 8.2 02/10/2014 1140   BUN 7 01/16/2014 0530   CREATININE 0.7 02/10/2014 1140   CREATININE 0.63 01/16/2014 0530   CREATININE 0.83 09/07/2013 1506      Component Value Date/Time   CALCIUM 8.4 02/10/2014 1140   CALCIUM 8.0* 01/16/2014 0530   ALKPHOS 62 02/10/2014 1140   ALKPHOS 66 11/14/2013 1057   AST 20 02/10/2014 1140   AST 12 11/14/2013 1057   ALT 18 02/10/2014 1140   ALT 15 11/14/2013 1057   BILITOT 1.12 02/10/2014 1140   BILITOT 1.4* 11/14/2013 1057  RADIOGRAPHIC STUDIES: Ct Chest W Contrast  12/31/2013   CLINICAL DATA:  History of lung cancer diagnosed in May 2015. Chemotherapy in progress. Radiation therapy complete. Shortness of breath. Right lower quadrant pain. Constipation.  EXAM: CT CHEST WITH CONTRAST  TECHNIQUE: Multidetector CT imaging of the chest was performed during intravenous contrast administration.  CONTRAST:  113m OMNIPAQUE IOHEXOL 300 MG/ML  SOLN  COMPARISON:  CT of the chest, abdomen and pelvis 11/22/2013.  FINDINGS: CT CHEST FINDINGS  Mediastinum: Bulky right supraclavicular lymphadenopathy has significantly increased compared to the prior examination. Image 6 of series 2 demonstrates an enlarging conglomeration of lymph nodes measuring 5.6 x 2.4 cm in the right supraclavicular region. Inferior aspect of the right internal jugular vein appears thrombosed, new compared to the prior examination. A right internal jugular double-lumen Port-A-Cath is present with tip terminating in the right atrium. The superior vena cava remains patent at this time. Compared to the prior examinations, there is significantly worsened mediastinal and left hilar lymphadenopathy. In the superior mediastinum markedly enlarged lymph nodes completely encase multiple vessels, including the innominate artery, best appreciated on image 13 of series 2. Markedly enlarged right paratracheal lymph node measuring 2.4 cm is significantly increased compared to the prior examination at which point it measured  17 mm. Subcarinal node is also significantly increased compared to the prior examination, measuring up to 3.9 x 2.4 cm, as is extensive bulky left hilar lymphadenopathy. Heart size is normal. There is no significant pericardial fluid, thickening or pericardial calcification. Esophagus is unremarkable in appearance.  Lungs/Pleura: Multiple large pulmonary nodules and masses are noted in the left lower lobe, the largest mass or conglomeration of nodules measures 3.6 x 4.3 cm (image 30 of series 5). Numerous other smaller pulmonary nodules are seen throughout the lungs bilaterally, compatible with widespread metastatic disease. These have increased in size compared to the prior examination, with a specific example of this a 2.2 x 1.4 cm nodule in the medial aspect of the left upper lobe (image 16 of series 5), which previously measured 1.9 x 1.2 cm on prior study 11/22/2013. Some other nodules are stable in size. No definite new nodules are noted. Trace left pleural effusion layering dependently.  Musculoskeletal: There are no aggressive appearing lytic or blastic lesions noted in the visualized portions of the skeleton.  CT ABDOMEN AND PELVIS FINDINGS  Abdomen/Pelvis: Interval enlargement of 2 hypovascular liver lesions in the right lobe of the liver, largest of which measures 1.9 x 1.4 cm in segment 7 (image 46 of series 2), presumably enlarging hepatic metastases. No new hepatic lesions are noted. The appearance of the gallbladder, pancreas, bilateral adrenal glands and bilateral kidneys is unremarkable. The spleen appears borderline enlarged measuring up to 11.4 cm.  No significant volume of ascites. No pneumoperitoneum. No pathologic distention of small bowel. No lymphadenopathy noted in the abdomen or pelvis. Mild atherosclerosis throughout the abdominal and pelvic vasculature, without evidence of aneurysm. Uterus and ovaries are unremarkable in appearance. Urinary bladder is normal in appearance.   Musculoskeletal: Large lytic lesion measuring approximately 2.6 x 2.1 cm in S1 again noted.  IMPRESSION: 1. Today's study demonstrates progression of disease, predominantly manifest by marked enlargement of right supraclavicular, mediastinal and left hilar lymphadenopathy, as well as several left lower lobe pulmonary nodules. Several other pulmonary nodules in the lungs bilaterally have also enlarged, while many others appear stable. No definite new pulmonary nodules are noted. 2. In addition, there has been some interval enlargement of 2 metastatic lesions in  segment 7 of the liver. 3. Large lytic lesion in S1 again noted, presumably a metastatic lesion. 4. Additional incidental findings, as above.   Electronically Signed   By: Vinnie Langton M.D.   On: 12/31/2013 16:53   Ct Abdomen Pelvis W Contrast  12/31/2013   CLINICAL DATA:  History of lung cancer diagnosed in May 2015. Chemotherapy in progress. Radiation therapy complete. Shortness of breath. Right lower quadrant pain. Constipation.  EXAM: CT CHEST WITH CONTRAST  TECHNIQUE: Multidetector CT imaging of the chest was performed during intravenous contrast administration.  CONTRAST:  123m OMNIPAQUE IOHEXOL 300 MG/ML  SOLN  COMPARISON:  CT of the chest, abdomen and pelvis 11/22/2013.  FINDINGS: CT CHEST FINDINGS  Mediastinum: Bulky right supraclavicular lymphadenopathy has significantly increased compared to the prior examination. Image 6 of series 2 demonstrates an enlarging conglomeration of lymph nodes measuring 5.6 x 2.4 cm in the right supraclavicular region. Inferior aspect of the right internal jugular vein appears thrombosed, new compared to the prior examination. A right internal jugular double-lumen Port-A-Cath is present with tip terminating in the right atrium. The superior vena cava remains patent at this time. Compared to the prior examinations, there is significantly worsened mediastinal and left hilar lymphadenopathy. In the superior  mediastinum markedly enlarged lymph nodes completely encase multiple vessels, including the innominate artery, best appreciated on image 13 of series 2. Markedly enlarged right paratracheal lymph node measuring 2.4 cm is significantly increased compared to the prior examination at which point it measured 17 mm. Subcarinal node is also significantly increased compared to the prior examination, measuring up to 3.9 x 2.4 cm, as is extensive bulky left hilar lymphadenopathy. Heart size is normal. There is no significant pericardial fluid, thickening or pericardial calcification. Esophagus is unremarkable in appearance.  Lungs/Pleura: Multiple large pulmonary nodules and masses are noted in the left lower lobe, the largest mass or conglomeration of nodules measures 3.6 x 4.3 cm (image 30 of series 5). Numerous other smaller pulmonary nodules are seen throughout the lungs bilaterally, compatible with widespread metastatic disease. These have increased in size compared to the prior examination, with a specific example of this a 2.2 x 1.4 cm nodule in the medial aspect of the left upper lobe (image 16 of series 5), which previously measured 1.9 x 1.2 cm on prior study 11/22/2013. Some other nodules are stable in size. No definite new nodules are noted. Trace left pleural effusion layering dependently.  Musculoskeletal: There are no aggressive appearing lytic or blastic lesions noted in the visualized portions of the skeleton.  CT ABDOMEN AND PELVIS FINDINGS  Abdomen/Pelvis: Interval enlargement of 2 hypovascular liver lesions in the right lobe of the liver, largest of which measures 1.9 x 1.4 cm in segment 7 (image 46 of series 2), presumably enlarging hepatic metastases. No new hepatic lesions are noted. The appearance of the gallbladder, pancreas, bilateral adrenal glands and bilateral kidneys is unremarkable. The spleen appears borderline enlarged measuring up to 11.4 cm.  No significant volume of ascites. No  pneumoperitoneum. No pathologic distention of small bowel. No lymphadenopathy noted in the abdomen or pelvis. Mild atherosclerosis throughout the abdominal and pelvic vasculature, without evidence of aneurysm. Uterus and ovaries are unremarkable in appearance. Urinary bladder is normal in appearance.  Musculoskeletal: Large lytic lesion measuring approximately 2.6 x 2.1 cm in S1 again noted.  IMPRESSION: 1. Today's study demonstrates progression of disease, predominantly manifest by marked enlargement of right supraclavicular, mediastinal and left hilar lymphadenopathy, as well as several left lower  lobe pulmonary nodules. Several other pulmonary nodules in the lungs bilaterally have also enlarged, while many others appear stable. No definite new pulmonary nodules are noted. 2. In addition, there has been some interval enlargement of 2 metastatic lesions in segment 7 of the liver. 3. Large lytic lesion in S1 again noted, presumably a metastatic lesion. 4. Additional incidental findings, as above.   Electronically Signed   By: Vinnie Langton M.D.   On: 12/31/2013 16:53   ASSESSMENT AND PLAN: This is a very pleasant 63 years old white female with:   1) stage IV non-small cell lung cancer, adenocarcinoma with metastatic disease to the bone, brain and liver. She is currently undergoing systemic chemotherapy with carboplatin, Alimta and Avastin is status post 5 cycles. She had some evidence for mild disease progression after cycle #3 especially in the liver lesions but the patient was asked to continue her treatment with the same regimen by Dr. Oretha Caprice. Her recent CT scan reveals evidence for disease progression within the lungs and liver as well as extensive bone metastasis. Patient was discussed with and also seen by Dr. Julien Nordmann. These results were reviewed with the patient and her significant other. We will discontinue her current chemotherapy secondary to disease progression. Patient was given the option of  second line chemotherapy with docetaxel plus/minus Cyramza versus initiation of immunotherapy with Nivolumab versus a referral to hospice/palliative care. She chose to proceed with immunotherapy with Nivolumab.She is status post 2 cycles. She'll proceed with cycle #3 today as scheduled. She'll return in 2 weeks prior to cycle #4.  Her previous molecular studies showed positive ERBB3 amplification. One of the options to treat this abnormality this treatment with single agent oral Afatinib.  2) metastatic bone disease: The patient will continue treatment with Xgeva subcutaneously every 2 months. She was referred  to Dr. Alvan Dame with [facility] orthopedic for evaluation and close monitoring of the left hip lesion since the patient has transferred her care to [facility].  3) pain management: She will continue on tramadol 50 mg by mouth every 6 hours as needed for pain.  4) constipation and hemorrhoids: She will continue her current treatment with Dulcolax suppository in addition to MiraLAX on an as-needed basis.  5) nausea: Patient will be continued on Zyprexa 10 mg by mouth at bedtime. She's had significantly reduced intake of both food and fluids. We will arrange for receive IV fluids later this week.  6) appetite stimulation: Patient will be placed on Marinol 2.5 mg by mouth twice daily to them stimulate her appetite and improve her by mouth intake.  This prescription was sent her pharmacy of record via [employer].  7) anemia: Patient's hemoglobin is 7.4 g/dL today. This is of unclear etiology although could be remnants of her previous chemotherapy. We will arrange for her to receive a total of 2 units of packed red blood cells.  The patient is interested in finding out what hospice and palliative care may have to offer. We will arrange a "meet and great" visit for her.  All questions were answered. The patient knows to call the clinic with any problems, questions or concerns. We can certainly see the  patient much sooner if necessary.  Carlton Adam PA-C  ADDENDUM: Hematology/Oncology Attending: I had a face to face encounter with the patient. I recommended her care plan. This is a very pleasant 63 years old white female with stage IV non-small cell lung cancer,adenocarcinoma with a very complicated medical conditions including significant fatigue and  weakness as well as anemia and lack of appetite as well as weight loss. The patient is currently on treatment with immunotherapy with single agent of her lab status post 2 cycles and tolerating her treatment well. She continues to have poor by mouth intake and requested to have IV fluid on a scheduled basis. I will arrange for the patient to receive IV fluids in 2 days. I will also schedule her to have 2 units of PRBCs transfusion in the next 1-2 days. We also briefly discussed with the patient the option of palliative care and hospice. She will think about this option. She would continue his chemotherapy today as scheduled. The patient would come back for followup visit in 2 weeks with the next cycle of her treatment. She was advised to call immediately if she has any concerning symptoms in the interval.  Disclaimer: This note was dictated with voice recognition software. Similar sounding words can inadvertently be transcribed and may not be corrected upon review. Eilleen Kempf., MD 02/13/2014

## 2014-02-10 NOTE — Telephone Encounter (Signed)
Gave avs & cal for Nov.  °

## 2014-02-10 NOTE — Progress Notes (Signed)
Per Adrena PA, okay to tx with hgb-7.3. Patient to receive 1 unit PRBCs

## 2014-02-11 ENCOUNTER — Other Ambulatory Visit: Payer: Self-pay | Admitting: *Deleted

## 2014-02-11 ENCOUNTER — Telehealth: Payer: Self-pay | Admitting: *Deleted

## 2014-02-11 ENCOUNTER — Telehealth: Payer: Self-pay | Admitting: Medical

## 2014-02-11 ENCOUNTER — Ambulatory Visit: Payer: BC Managed Care – PPO

## 2014-02-11 ENCOUNTER — Other Ambulatory Visit: Payer: Self-pay | Admitting: Physician Assistant

## 2014-02-11 DIAGNOSIS — D630 Anemia in neoplastic disease: Secondary | ICD-10-CM

## 2014-02-11 DIAGNOSIS — E860 Dehydration: Secondary | ICD-10-CM

## 2014-02-11 NOTE — Telephone Encounter (Signed)
Per staff message and POF I have scheduled appts. Advised scheduler of appts. JMW  

## 2014-02-11 NOTE — Telephone Encounter (Signed)
LM to confirm d/t for 02/24/14.

## 2014-02-12 ENCOUNTER — Ambulatory Visit (HOSPITAL_BASED_OUTPATIENT_CLINIC_OR_DEPARTMENT_OTHER): Payer: BC Managed Care – PPO

## 2014-02-12 VITALS — BP 118/71 | HR 90 | Temp 97.1°F | Resp 18

## 2014-02-12 DIAGNOSIS — E860 Dehydration: Secondary | ICD-10-CM

## 2014-02-12 DIAGNOSIS — D630 Anemia in neoplastic disease: Secondary | ICD-10-CM

## 2014-02-12 LAB — PREPARE RBC (CROSSMATCH)

## 2014-02-12 MED ORDER — SODIUM CHLORIDE 0.9 % IJ SOLN
3.0000 mL | INTRAMUSCULAR | Status: AC | PRN
  Administered 2014-02-12: 10 mL
  Filled 2014-02-12: qty 10

## 2014-02-12 MED ORDER — HEPARIN SOD (PORK) LOCK FLUSH 100 UNIT/ML IV SOLN
500.0000 [IU] | Freq: Every day | INTRAVENOUS | Status: AC | PRN
  Administered 2014-02-12: 500 [IU]
  Filled 2014-02-12: qty 5

## 2014-02-12 MED ORDER — SODIUM CHLORIDE 0.9 % IV SOLN
INTRAVENOUS | Status: DC
  Administered 2014-02-12: 500 mL via INTRAVENOUS

## 2014-02-12 MED ORDER — SODIUM CHLORIDE 0.9 % IV SOLN
250.0000 mL | Freq: Once | INTRAVENOUS | Status: AC
  Administered 2014-02-12: 250 mL via INTRAVENOUS

## 2014-02-12 MED ORDER — HEPARIN SOD (PORK) LOCK FLUSH 100 UNIT/ML IV SOLN
250.0000 [IU] | INTRAVENOUS | Status: DC | PRN
  Filled 2014-02-12: qty 5

## 2014-02-12 NOTE — Patient Instructions (Signed)
Continue labs and chemotherapy is scheduled Your hemoglobin is low and we'll arrange she received a total of 2 units of packed red blood cells Per your request we'll arrange for hospice and palliative care to meet with you to discuss their services Follow-up in 2 weeks

## 2014-02-12 NOTE — Patient Instructions (Signed)
Blood Transfusion  A blood transfusion replaces your blood or some of its parts. Blood is replaced when you have lost blood because of surgery, an accident, or for severe blood conditions like anemia. You can donate blood to be used on yourself if you have a planned surgery. If you lose blood during that surgery, your own blood can be given back to you. Any blood given to you is checked to make sure it matches your blood type. Your temperature, blood pressure, and heart rate (vital signs) will be checked often.  GET HELP RIGHT AWAY IF:   You feel sick to your stomach (nauseous) or throw up (vomit).  You have watery poop (diarrhea).  You have shortness of breath or trouble breathing.  You have blood in your pee (urine) or have dark colored pee.  You have chest pain or tightness.  Your eyes or skin turn yellow (jaundice).  You have a temperature by mouth above 102 F (38.9 C), not controlled by medicine.  You start to shake and have chills.  You develop a a red rash (hives) or feel itchy.  You develop lightheadedness or feel confused.  You develop back, joint, or muscle pain.  You do not feel hungry (lost appetite).  You feel tired, restless, or nervous.  You develop belly (abdominal) cramps. Document Released: 06/28/2008 Document Revised: 06/24/2011 Document Reviewed: 06/28/2008 [facility] Patient Information 2015 [place], [place]. This information is not intended to replace advice given to you by your health care provider. Make sure you discuss any questions you have with your health care provider.   Dehydration, Adult Dehydration means your body does not have as much fluid as it needs. Your kidneys, brain, and heart will not work properly without the right amount of fluids and salt.  HOME CARE  Ask your doctor how to replace body fluid losses (rehydrate).  Drink enough fluids to keep your pee (urine) clear or pale yellow.  Drink small amounts of fluids often if you feel  sick to your stomach (nauseous) or throw up (vomit).  Eat like you normally do.  Avoid:  Foods or drinks high in sugar.  Bubbly (carbonated) drinks.  Juice.  Very hot or cold fluids.  Drinks with caffeine.  Fatty, greasy foods.  Alcohol.  Tobacco.  Eating too much.  Gelatin desserts.  Wash your hands to avoid spreading germs (bacteria, viruses).  Only take medicine as told by your doctor.  Keep all doctor visits as told. GET HELP RIGHT AWAY IF:   You cannot drink something without throwing up.  You get worse even with treatment.  Your vomit has blood in it or looks greenish.  Your poop (stool) has blood in it or looks black and tarry.  You have not peed in 6 to 8 hours.  You pee a small amount of very dark pee.  You have a fever.  You pass out (faint).  You have belly (abdominal) pain that gets worse or stays in one spot (localizes).  You have a rash, stiff neck, or bad headache.  You get easily annoyed, sleepy, or are hard to wake up.  You feel weak, dizzy, or very thirsty. MAKE SURE YOU:   Understand these instructions.  Will watch your condition.  Will get help right away if you are not doing well or get worse. Document Released: 01/26/2009 Document Revised: 06/24/2011 Document Reviewed: 11/19/2010 [facility] Patient Information 2015 [place], [place]. This information is not intended to replace advice given to you by your health care  provider. Make sure you discuss any questions you have with your health care provider.

## 2014-02-13 LAB — TYPE AND SCREEN
ABO/RH(D): O POS
ANTIBODY SCREEN: NEGATIVE
Unit division: 0
Unit division: 0

## 2014-02-14 ENCOUNTER — Ambulatory Visit
Admission: RE | Admit: 2014-02-14 | Discharge: 2014-02-14 | Disposition: A | Discharge status: Home / Self Care | Payer: BC Managed Care – PPO | Source: Ambulatory Visit | Attending: Radiation Oncology | Admitting: Radiation Oncology

## 2014-02-14 DIAGNOSIS — Z510 Encounter for antineoplastic radiation therapy: Secondary | ICD-10-CM | POA: Diagnosis not present

## 2014-02-15 ENCOUNTER — Encounter: Payer: Self-pay | Admitting: Radiation Oncology

## 2014-02-15 ENCOUNTER — Ambulatory Visit
Admission: RE | Admit: 2014-02-15 | Discharge: 2014-02-15 | Disposition: A | Discharge status: Home / Self Care | Payer: BC Managed Care – PPO | Source: Ambulatory Visit | Attending: Radiation Oncology | Admitting: Radiation Oncology

## 2014-02-15 ENCOUNTER — Telehealth: Payer: Self-pay | Admitting: *Deleted

## 2014-02-15 ENCOUNTER — Telehealth: Payer: Self-pay

## 2014-02-15 VITALS — BP 105/69 | HR 112 | Resp 18 | Wt 130.8 lb

## 2014-02-15 DIAGNOSIS — Z510 Encounter for antineoplastic radiation therapy: Secondary | ICD-10-CM | POA: Diagnosis present

## 2014-02-15 DIAGNOSIS — C3492 Malignant neoplasm of unspecified part of left bronchus or lung: Secondary | ICD-10-CM

## 2014-02-15 DIAGNOSIS — C3490 Malignant neoplasm of unspecified part of unspecified bronchus or lung: Secondary | ICD-10-CM | POA: Diagnosis not present

## 2014-02-15 MED ORDER — RADIAPLEXRX EX GEL
Freq: Once | CUTANEOUS | Status: AC
  Administered 2014-02-15: 13:00:00 via TOPICAL

## 2014-02-15 NOTE — Addendum Note (Signed)
Encounter addended by: Heywood Footman, RN on: 02/15/2014  1:09 PM<BR>     Documentation filed: Medications

## 2014-02-15 NOTE — Progress Notes (Signed)
Oriented patient to staff and routine of the clinic. Provided patient with RADIATION THERAPY AND YOU handbook then, reviewed pertinent information. Educated patient reference potential side effects and management such as, fatigue, skin changes and worsening cough. Provided patient with radiaplex then, directed upon use. Patient verbalized understanding. Patient reports a persistent dry cough. Reports intermittent chest pain related to frequent cough. Reports feeling irritation on the right side of her throat when she swallows. Concerned about 30 lb weight loss since May. Denies skin changes.

## 2014-02-15 NOTE — Addendum Note (Signed)
Encounter addended by: Heywood Footman, RN on: 02/15/2014 12:57 PM<BR>     Documentation filed: [employer], Inpatient MAR, Orders

## 2014-02-15 NOTE — Progress Notes (Signed)
  Radiation Oncology         [phone] ________________________________  Name: Sherri Stafford MRN: [id number]  Date: 02/15/2014  DOB: 1951-03-20  Weekly Radiation Therapy Management  DIAGNOSIS: stage IV (T2a, N3, M1b) non-small cell lung cancer, adenocarcinoma   Current Dose: 7.5 Gy     Planned Dose:  35 Gy  Narrative . . . . . . . . The patient presents for routine under treatment assessment.                                   The patient complains of a sore throat and points to her lower neck region. Her pain in the lower back and pelvis area has improved. I did discuss with her that I have received her records from [facility] and we would be able to give additional radiation therapy to these areas if necessary. The patient's cough has improved.                                 Set-up films were reviewed.                                 The chart was checked. Physical Findings. . .  weight is 130 lb 12.8 oz (59.33 kg). Her blood pressure is 105/69 and her pulse is 112. Her respiration is 18 and oxygen saturation is 97%. . Question of a candida infection along the dorsum of the tongue. The lungs are clear. The heart has a regular rhythm and rate. Impression . . . . . . . The patient is tolerating radiation. Plan . . . . . . . . . . . . Continue treatment as planned. The patient will be seen again later this week for examination of the oral cavity to assess for possible candida infection.  ________________________________   Blair Promise, PhD, MD

## 2014-02-15 NOTE — Telephone Encounter (Signed)
Gwenlyn Fudge from [facility] [place] called to inform Sherri Stafford that she visited with the pt yesterday 02/14/14 and her Aunt providing an informational session, answering all questions and provided her with pamphlets. Pt at this time decided she was still going to go through tx and is aware that she can contact hospice at any time. Pt opted in to have Hospice call monthly to check up on her and provide support as needed. If any further questions Gwenlyn Fudge can be contacted at [phone].

## 2014-02-15 NOTE — Telephone Encounter (Signed)
Received call from [place] with [facility], pt's physical therapist.  He called stating that pt has been requesting to talk to a social worker regarding caregiver services and wanting to speak to someone about depressed mood.  Information forwarded to Polo Riley, Education officer, museum.  Per lauren Tarry Kos, she will contact pt and recommends order for outpatient social work also be placed.  Order placed in EPIC.

## 2014-02-16 ENCOUNTER — Ambulatory Visit
Admission: RE | Admit: 2014-02-16 | Discharge: 2014-02-16 | Disposition: A | Discharge status: Home / Self Care | Payer: BC Managed Care – PPO | Source: Ambulatory Visit | Attending: Radiation Oncology | Admitting: Radiation Oncology

## 2014-02-16 DIAGNOSIS — Z510 Encounter for antineoplastic radiation therapy: Secondary | ICD-10-CM | POA: Diagnosis not present

## 2014-02-17 ENCOUNTER — Inpatient Hospital Stay
Admission: RE | Admit: 2014-02-17 | Discharge: 2014-02-17 | Disposition: A | Discharge status: Home / Self Care | Payer: BC Managed Care – PPO | Source: Ambulatory Visit | Attending: Radiation Oncology | Admitting: Radiation Oncology

## 2014-02-17 ENCOUNTER — Ambulatory Visit
Admission: RE | Admit: 2014-02-17 | Discharge: 2014-02-17 | Disposition: A | Discharge status: Home / Self Care | Payer: BC Managed Care – PPO | Source: Ambulatory Visit | Attending: Radiation Oncology | Admitting: Radiation Oncology

## 2014-02-17 ENCOUNTER — Telehealth: Payer: Self-pay | Admitting: *Deleted

## 2014-02-17 DIAGNOSIS — C3490 Malignant neoplasm of unspecified part of unspecified bronchus or lung: Secondary | ICD-10-CM

## 2014-02-17 DIAGNOSIS — Z510 Encounter for antineoplastic radiation therapy: Secondary | ICD-10-CM | POA: Diagnosis not present

## 2014-02-17 NOTE — Telephone Encounter (Signed)
Received call from [place] with [facility], he recommends that pt get a OT eval and treat order as well as a home health aid.  Orders faxed to [facility]

## 2014-02-17 NOTE — Progress Notes (Signed)
  Radiation Oncology         [phone] ________________________________  Name: Sherri Stafford MRN: [id number]  Date: 02/17/2014  DOB: 02-12-1951  Weekly Radiation Therapy Management     Narrative . . . . . . . . The patient is seen again. On exam earlier this week she was felt to have a possible Candida  infection along the dorsum of the tongue.  On exam today there no signs of thrush within the oral cavity. I have recommended the patient brushed her tongue in addition to her teeth.                                   ________________________________   Blair Promise, PhD, MD

## 2014-02-18 ENCOUNTER — Other Ambulatory Visit: Payer: Self-pay | Admitting: Internal Medicine

## 2014-02-18 ENCOUNTER — Ambulatory Visit
Admission: RE | Admit: 2014-02-18 | Discharge: 2014-02-18 | Disposition: A | Discharge status: Home / Self Care | Payer: BC Managed Care – PPO | Source: Ambulatory Visit | Attending: Radiation Oncology | Admitting: Radiation Oncology

## 2014-02-18 ENCOUNTER — Telehealth: Payer: Self-pay | Admitting: *Deleted

## 2014-02-18 ENCOUNTER — Ambulatory Visit: Payer: BC Managed Care – PPO | Admitting: Radiation Oncology

## 2014-02-18 DIAGNOSIS — Z510 Encounter for antineoplastic radiation therapy: Secondary | ICD-10-CM | POA: Diagnosis not present

## 2014-02-18 DIAGNOSIS — C3490 Malignant neoplasm of unspecified part of unspecified bronchus or lung: Secondary | ICD-10-CM

## 2014-02-18 DIAGNOSIS — R110 Nausea: Secondary | ICD-10-CM

## 2014-02-18 MED ORDER — FENTANYL 50 MCG/HR TD PT72: 50.0000 ug | MEDICATED_PATCH | TRANSDERMAL | Status: DC

## 2014-02-18 MED ORDER — PROCHLORPERAZINE MALEATE 10 MG PO TABS: 10.0000 mg | ORAL_TABLET | Freq: Four times a day (QID) | ORAL | Status: DC | PRN

## 2014-02-18 NOTE — Telephone Encounter (Signed)
Late entry 02/17/14:  Pt called requesting IVF.  She is not eating or drinking much at home.  Pt states she feels like "radiation is making me tired".  Dr Vista Mink out of the office.  Spoke with Delos Haring, she states pt should be encouraged to drink more fluids at home and contact radiation oncologist if she feels like she will need IVF's and they can evaluate whether she needs fluids.  Encouraged pt to drink oral fluids at home and to contact rad-onc.  She verbalized understanding.

## 2014-02-21 ENCOUNTER — Ambulatory Visit
Admission: RE | Admit: 2014-02-21 | Discharge: 2014-02-21 | Disposition: A | Discharge status: Home / Self Care | Payer: BC Managed Care – PPO | Source: Ambulatory Visit | Attending: Radiation Oncology | Admitting: Radiation Oncology

## 2014-02-21 DIAGNOSIS — Z510 Encounter for antineoplastic radiation therapy: Secondary | ICD-10-CM | POA: Diagnosis not present

## 2014-02-22 ENCOUNTER — Ambulatory Visit
Admission: RE | Admit: 2014-02-22 | Discharge: 2014-02-22 | Disposition: A | Discharge status: Home / Self Care | Payer: BC Managed Care – PPO | Source: Ambulatory Visit | Attending: Radiation Oncology | Admitting: Radiation Oncology

## 2014-02-22 DIAGNOSIS — Z510 Encounter for antineoplastic radiation therapy: Secondary | ICD-10-CM | POA: Diagnosis not present

## 2014-02-23 ENCOUNTER — Ambulatory Visit
Admission: RE | Admit: 2014-02-23 | Discharge: 2014-02-23 | Disposition: A | Discharge status: Home / Self Care | Payer: BC Managed Care – PPO | Source: Ambulatory Visit | Attending: Radiation Oncology | Admitting: Radiation Oncology

## 2014-02-23 ENCOUNTER — Encounter: Payer: Self-pay | Admitting: Radiation Oncology

## 2014-02-23 ENCOUNTER — Ambulatory Visit (HOSPITAL_BASED_OUTPATIENT_CLINIC_OR_DEPARTMENT_OTHER)
Admission: RE | Admit: 2014-02-23 | Discharge: 2014-02-23 | Disposition: A | Discharge status: Home / Self Care | Payer: BC Managed Care – PPO | Source: Ambulatory Visit | Attending: Radiation Oncology | Admitting: Radiation Oncology

## 2014-02-23 ENCOUNTER — Ambulatory Visit (HOSPITAL_BASED_OUTPATIENT_CLINIC_OR_DEPARTMENT_OTHER): Payer: BC Managed Care – PPO

## 2014-02-23 ENCOUNTER — Other Ambulatory Visit: Payer: Self-pay | Admitting: Oncology

## 2014-02-23 VITALS — BP 106/74 | HR 126 | Temp 97.5°F | Resp 16 | Ht 63.0 in | Wt 122.4 lb

## 2014-02-23 VITALS — BP 100/63 | HR 92 | Temp 98.4°F | Resp 18

## 2014-02-23 DIAGNOSIS — C3492 Malignant neoplasm of unspecified part of left bronchus or lung: Secondary | ICD-10-CM

## 2014-02-23 DIAGNOSIS — R112 Nausea with vomiting, unspecified: Secondary | ICD-10-CM

## 2014-02-23 DIAGNOSIS — C3490 Malignant neoplasm of unspecified part of unspecified bronchus or lung: Secondary | ICD-10-CM

## 2014-02-23 DIAGNOSIS — Z79899 Other long term (current) drug therapy: Secondary | ICD-10-CM

## 2014-02-23 DIAGNOSIS — C3432 Malignant neoplasm of lower lobe, left bronchus or lung: Secondary | ICD-10-CM

## 2014-02-23 DIAGNOSIS — D649 Anemia, unspecified: Secondary | ICD-10-CM

## 2014-02-23 DIAGNOSIS — Z510 Encounter for antineoplastic radiation therapy: Secondary | ICD-10-CM | POA: Diagnosis not present

## 2014-02-23 LAB — COMPREHENSIVE METABOLIC PANEL (CC13)
ALK PHOS: 78 U/L (ref 40–150)
ALT: 20 U/L (ref 0–55)
AST: 20 U/L (ref 5–34)
Albumin: 2.3 g/dL — ABNORMAL LOW (ref 3.5–5.0)
Anion Gap: 12 mEq/L — ABNORMAL HIGH (ref 3–11)
BUN: 10.4 mg/dL (ref 7.0–26.0)
CO2: 25 mEq/L (ref 22–29)
Calcium: 9.1 mg/dL (ref 8.4–10.4)
Chloride: 96 mEq/L — ABNORMAL LOW (ref 98–109)
Creatinine: 0.7 mg/dL (ref 0.6–1.1)
GLUCOSE: 111 mg/dL (ref 70–140)
POTASSIUM: 4 meq/L (ref 3.5–5.1)
SODIUM: 132 meq/L — AB (ref 136–145)
TOTAL PROTEIN: 6.9 g/dL (ref 6.4–8.3)
Total Bilirubin: 1.23 mg/dL — ABNORMAL HIGH (ref 0.20–1.20)

## 2014-02-23 LAB — CBC WITH DIFFERENTIAL/PLATELET
BASO%: 0.4 % (ref 0.0–2.0)
Basophils Absolute: 0 10*3/uL (ref 0.0–0.1)
EOS ABS: 0 10*3/uL (ref 0.0–0.5)
EOS%: 0.5 % (ref 0.0–7.0)
HCT: 32.6 % — ABNORMAL LOW (ref 34.8–46.6)
HGB: 10.6 g/dL — ABNORMAL LOW (ref 11.6–15.9)
LYMPH%: 6.4 % — ABNORMAL LOW (ref 14.0–49.7)
MCH: 27.1 pg (ref 25.1–34.0)
MCHC: 32.4 g/dL (ref 31.5–36.0)
MCV: 83.7 fL (ref 79.5–101.0)
MONO#: 0.7 10*3/uL (ref 0.1–0.9)
MONO%: 9.4 % (ref 0.0–14.0)
NEUT#: 6.3 10*3/uL (ref 1.5–6.5)
NEUT%: 83.3 % — ABNORMAL HIGH (ref 38.4–76.8)
Platelets: 397 10*3/uL (ref 145–400)
RBC: 3.89 10*6/uL (ref 3.70–5.45)
RDW: 17.9 % — AB (ref 11.2–14.5)
WBC: 7.5 10*3/uL (ref 3.9–10.3)
lymph#: 0.5 10*3/uL — ABNORMAL LOW (ref 0.9–3.3)

## 2014-02-23 LAB — TSH CHCC: TSH: 3.164 m(IU)/L (ref 0.308–3.960)

## 2014-02-23 MED ORDER — HEPARIN SOD (PORK) LOCK FLUSH 100 UNIT/ML IV SOLN
500.0000 [IU] | Freq: Once | INTRAVENOUS | Status: AC
  Administered 2014-02-23: 500 [IU] via INTRAVENOUS
  Filled 2014-02-23: qty 5

## 2014-02-23 MED ORDER — SODIUM CHLORIDE 0.9 % IV SOLN
INTRAVENOUS | Status: AC
  Administered 2014-02-23: 13:00:00 via INTRAVENOUS

## 2014-02-23 MED ORDER — SODIUM CHLORIDE 0.9 % IJ SOLN
10.0000 mL | INTRAMUSCULAR | Status: DC | PRN
  Administered 2014-02-23: 10 mL via INTRAVENOUS
  Filled 2014-02-23: qty 10

## 2014-02-23 MED ORDER — PROCHLORPERAZINE MALEATE 10 MG PO TABS
ORAL_TABLET | ORAL | Status: AC
  Filled 2014-02-23: qty 1

## 2014-02-23 MED ORDER — PROCHLORPERAZINE MALEATE 10 MG PO TABS: 10.0000 mg | ORAL_TABLET | Freq: Once | ORAL | Status: DC

## 2014-02-23 MED ORDER — PROCHLORPERAZINE MALEATE 10 MG PO TABS
10.0000 mg | ORAL_TABLET | Freq: Once | ORAL | Status: AC
  Administered 2014-02-23: 10 mg via ORAL
  Filled 2014-02-23: qty 1

## 2014-02-23 NOTE — Progress Notes (Addendum)
  Radiation Oncology         [phone] ________________________________  Name: Sherri Stafford MRN: [id number]  Date: 02/23/2014  DOB: December 23, 1950  Weekly Radiation Therapy Management  DIAGNOSIS: stage IV (T2a, N3, M1b) non-small cell lung cancer, adenocarcinoma   Current Dose: 22.5 Gy     Planned Dose:  35 Gy  Narrative . . . . . . . . The patient presents for routine under treatment assessment.                                   The patient feels weak. She's also had problems with nausea despite Compazine. The patient does not tolerate Zofran with hives.                                 Set-up films were reviewed.                                 The chart was checked. Physical Findings. . .  height is 5\' 3"  (1.6 m) and weight is 122 lb 6.4 oz (55.52 kg). Her oral temperature is 97.5 F (36.4 C). Her blood pressure is 106/74 and her pulse is 126. Her respiration is 16 and oxygen saturation is 99%. . The patient appears fatigued.  The lungs are clear. The heart has regular rhythm with increased rate.  The oral cavity is free of secondary infection, somewhat dry. Impression . . . . . . . The patient is tolerating radiation. Plan . . . . . . . . . . . . Continue treatment as planned.  The patient will be set up for IV fluid supplementation. She also have blood work.  The patient will also be given IV nausea medicine.  ________________________________   Blair Promise, PhD, MD

## 2014-02-23 NOTE — Patient Instructions (Signed)

## 2014-02-23 NOTE — Progress Notes (Signed)
Sherri Stafford has completed 9 fractions to her chest.  She reports pain in her abdomen at a 3/10 from throwing up twice this morning.  She does have a 50 mcg fentanyl patch on.  She reports having chemotherapy two weeks ago.  She reports having nausea and is taking compazine prn.  She says it does not help.  She reports vomiting 1-2 times a day and also having dry heaves.  She reports having diarrhea once yesterday and once this morning.  She is taking colace and senna due to her pain medications.  She reports an occasional dry cough and shortness of breath with acitivty.  Her oxygen sat today on room air was 99%.  She has lost 8 lbs since 11/3 and feels dizzy when standing.  Patient states that she needs IV fluids.  Orthostatic vitals taken: bp sitting 106/74, hr 126, bp standing 108/57, hr 126.  Heart rate feels irregular at times.  Her skin is intact on her chest and back.  She is using radiaplex.

## 2014-02-24 ENCOUNTER — Ambulatory Visit
Admission: RE | Admit: 2014-02-24 | Discharge: 2014-02-24 | Disposition: A | Discharge status: Home / Self Care | Payer: BC Managed Care – PPO | Source: Ambulatory Visit | Attending: Radiation Oncology | Admitting: Radiation Oncology

## 2014-02-24 ENCOUNTER — Other Ambulatory Visit: Payer: Self-pay | Admitting: *Deleted

## 2014-02-24 ENCOUNTER — Encounter: Payer: Self-pay | Admitting: Physician Assistant

## 2014-02-24 ENCOUNTER — Telehealth: Payer: Self-pay | Admitting: Physician Assistant

## 2014-02-24 ENCOUNTER — Ambulatory Visit (HOSPITAL_BASED_OUTPATIENT_CLINIC_OR_DEPARTMENT_OTHER): Payer: BC Managed Care – PPO | Admitting: Physician Assistant

## 2014-02-24 ENCOUNTER — Ambulatory Visit (HOSPITAL_BASED_OUTPATIENT_CLINIC_OR_DEPARTMENT_OTHER): Payer: BC Managed Care – PPO

## 2014-02-24 ENCOUNTER — Other Ambulatory Visit: Payer: BC Managed Care – PPO

## 2014-02-24 VITALS — BP 90/67 | HR 108 | Temp 98.1°F | Resp 18 | Ht 63.0 in | Wt 125.7 lb

## 2014-02-24 DIAGNOSIS — Z510 Encounter for antineoplastic radiation therapy: Secondary | ICD-10-CM | POA: Diagnosis not present

## 2014-02-24 DIAGNOSIS — C7951 Secondary malignant neoplasm of bone: Secondary | ICD-10-CM

## 2014-02-24 DIAGNOSIS — C3432 Malignant neoplasm of lower lobe, left bronchus or lung: Secondary | ICD-10-CM

## 2014-02-24 DIAGNOSIS — C3492 Malignant neoplasm of unspecified part of left bronchus or lung: Secondary | ICD-10-CM

## 2014-02-24 DIAGNOSIS — R630 Anorexia: Secondary | ICD-10-CM

## 2014-02-24 DIAGNOSIS — Z5112 Encounter for antineoplastic immunotherapy: Secondary | ICD-10-CM

## 2014-02-24 DIAGNOSIS — C787 Secondary malignant neoplasm of liver and intrahepatic bile duct: Secondary | ICD-10-CM

## 2014-02-24 DIAGNOSIS — R110 Nausea: Secondary | ICD-10-CM

## 2014-02-24 DIAGNOSIS — C7931 Secondary malignant neoplasm of brain: Secondary | ICD-10-CM

## 2014-02-24 DIAGNOSIS — K649 Unspecified hemorrhoids: Secondary | ICD-10-CM

## 2014-02-24 DIAGNOSIS — C3490 Malignant neoplasm of unspecified part of unspecified bronchus or lung: Secondary | ICD-10-CM

## 2014-02-24 DIAGNOSIS — K5900 Constipation, unspecified: Secondary | ICD-10-CM

## 2014-02-24 MED ORDER — SODIUM CHLORIDE 0.9 % IV SOLN
3.0000 mg/kg | Freq: Once | INTRAVENOUS | Status: AC
  Administered 2014-02-24: 180 mg via INTRAVENOUS
  Filled 2014-02-24: qty 18

## 2014-02-24 MED ORDER — SODIUM CHLORIDE 0.9 % IJ SOLN
10.0000 mL | INTRAMUSCULAR | Status: DC | PRN
  Administered 2014-02-24: 10 mL
  Filled 2014-02-24: qty 10

## 2014-02-24 MED ORDER — HEPARIN SOD (PORK) LOCK FLUSH 100 UNIT/ML IV SOLN
500.0000 [IU] | Freq: Once | INTRAVENOUS | Status: AC | PRN
  Administered 2014-02-24: 500 [IU]
  Filled 2014-02-24: qty 5

## 2014-02-24 MED ORDER — DENOSUMAB 120 MG/1.7ML ~~LOC~~ SOLN
120.0000 mg | Freq: Once | SUBCUTANEOUS | Status: AC
  Administered 2014-02-24: 120 mg via SUBCUTANEOUS
  Filled 2014-02-24: qty 1.7

## 2014-02-24 MED ORDER — SODIUM CHLORIDE 0.9 % IV SOLN
Freq: Once | INTRAVENOUS | Status: AC
  Administered 2014-02-24: 11:00:00 via INTRAVENOUS

## 2014-02-24 NOTE — Telephone Encounter (Signed)
Gave avs & cal for Nov.  °

## 2014-02-24 NOTE — Patient Instructions (Signed)
[facility] Discharge Instructions for Patients Receiving Chemotherapy  Today you received the following chemotherapy agents nivolumab   If you develop nausea and vomiting that is not controlled by your nausea medication, call the clinic.   BELOW ARE SYMPTOMS THAT SHOULD BE REPORTED IMMEDIATELY:  *FEVER GREATER THAN 100.5 F  *CHILLS WITH OR WITHOUT FEVER  NAUSEA AND VOMITING THAT IS NOT CONTROLLED WITH YOUR NAUSEA MEDICATION  *UNUSUAL SHORTNESS OF BREATH  *UNUSUAL BRUISING OR BLEEDING  TENDERNESS IN MOUTH AND THROAT WITH OR WITHOUT PRESENCE OF ULCERS  *URINARY PROBLEMS  *BOWEL PROBLEMS  UNUSUAL RASH Items with * indicate a potential emergency and should be followed up as soon as possible.  Feel free to call the clinic you have any questions or concerns. The clinic phone number is [phone].

## 2014-02-24 NOTE — Progress Notes (Addendum)
[place] [phone]   [phone]  SHARED VISIT PROGRESS NOTE  [name], MD [place] [address]  DIAGNOSIS: stage IV (T2a, N3, M1b) non-small cell lung cancer, adenocarcinoma presented with large left lower lobe lung mass in addition to bilateral pulmonary nodules and mediastinal and supraclavicular lymphadenopathy diagnosed in May of 2015.  Genomic Alterations Identified? ERBB3 amplification CDK4 amplification IDH2 R172S KRAS G12D MDM2 amplification RBM10 [id number] TERC amplification - equivocal? Additional Disease-relevant Genes with No Reportable Alterations Identified? RET ALK BRAF ERBB2 MET EGFR  PRIOR THERAPY:  1) status post [facility]  status post stereotactic radiotherapy to brain lesions under the care of [place] [facility] 11/17/2013 2) Systemic chemotherapy with carboplatin for AUC of 5, Alimta 500 mg/M2 and Avastin 15 mg/KG every 3 weeks, status post 5 cycles. [facility]   CURRENT THERAPY:  1) immunotherapy with Nivolmab at 3 mg/kg given every 3 weeks. Status post 3 cycles  2) Xgeva 120 mcg subcutaneously every 2 months.  INTERVAL HISTORY: SPRINCE OLIVIER89y.o. female returns to the clinic today for followup visit accompanied by a friend. The patient was seen for initial evaluation at the multidisciplinary thoracic oncology clinic on 09/01/2013 after she was diagnosed with metastatic non-small cell lung cancer. [facility] by Dr. DOretha Capriceand was started on treatment with carboplatin, Alimta and Avastin status post 4 cycles. Repeat CT scan of the chest, abdomen and pelvis after  cycle #3 on 11/22/2013 showed multiple bilateral pulmonary nodules and masses compatible with metastatic disease and compared to the previous scan on 08/12/2013 many of these nodule had slightly decreased in size. There was also mediastinal and bilateral hilar lymphadenopathy slightly decreased compared to the prior scan. The left hilar and mediastinal lymph nodes remain similar in size to the prior study. There is a new fracture of the right ninth rib suspicious for a pathologic fracture. CT scan of the abdomen and pelvis on the same day showed interval increase in size of 2 out of the 3 hypodense lesions within the liver concerning for worsening metastatic disease. The lesion within the right hepatic lobe measured 1.7 x 1.6 CM compared to 1.2 x 1.1 CM on the previous scan. An additional right hepatic lesion measured 1.2 x 0.9 CM compared to 0.8 x 0.7 CM and a lesion within the left hepatic lobe measured 0.9 x 0.6 CM compared to 0.9 x 0.5 CM previously. There was no evidence for new hepatic lesion. Is unchanged lytic lesions within the L3 vertebral body and pelvis consistent with osseous metastatic disease. Dr. DOretha Capricerecommended for the patient to continue her current systemic chemotherapy with carboplatin, Alimta and Avastin. She received cycle #4 on 11/22/2013.  [facility]. [place] cycle #5 [place] of the chest, abdomen and pelvis and presents revealed evidence for disease progression.   She is currently being treated with immunotherapy with Nivolumab status post 3 cycles. She denies any change in her baseline shortness of breath, denies skin rash or diarrhea. She continues to have poor  by mouth intake both food and fluids. She was in our office yesterday for IV fluids. She reports that she did meet with hospice and palliative care to become familiar with their  services. She has received 9 out of 14 planned fractions of radiation therapy. She is currently being treated for a pulmonary embolism diagnosed on 01/14/2014 and is currently on Lovenox injections. She presents to proceed with cycle #4 of her immunotherapy with Nivolumab.     MEDICAL HISTORY: Past Medical History  Diagnosis Date  . Arthritis   . Bilateral ovarian cysts   . GERD (gastroesophageal reflux disease)   . Strain of hip flexor 06/2013    left side torn  . Cancer     lung ca  . Bone metastases     to left hip and spine  . Radiation     at [facility] to left hip, sacrum and brain    ALLERGIES:  is allergic to other; ketoconazole; lorazepam; morphine and related; nystatin; sporanox; tylenol; vitamin d; and zofran.  MEDICATIONS:  Current Outpatient Prescriptions  Medication Sig Dispense Refill  . docusate sodium (COLACE) 100 MG capsule Take 100 mg by mouth 2 (two) times daily.      Marland Kitchen enoxaparin (LOVENOX) 100 MG/ML injection INJECT 0.9ML SUBCUTANEOUSLY EVERY DAY FOR 30 DAYS  30 Syringe  0  . esomeprazole (NEXIUM) 40 MG capsule Take 1 capsule (40 mg total) by mouth 2 (two) times daily before a meal.  60 capsule  0  . fentaNYL (DURAGESIC - DOSED MCG/HR) 50 MCG/HR Place 1 patch (50 mcg total) onto the skin every 3 (three) days.  5 patch  0  . gabapentin (NEURONTIN) 600 MG tablet Take 600 mg by mouth 3 (three) times daily.      . polyethylene glycol (MIRALAX / GLYCOLAX) packet Take 17 g by mouth daily as needed for moderate constipation.      . polyvinyl alcohol (LIQUIFILM TEARS) 1.4 % ophthalmic solution Place 1 drop into both eyes as needed for dry eyes.       Marland Kitchen prochlorperazine (COMPAZINE) 10 MG tablet Take 1 tablet (10 mg total) by mouth every 6 (six) hours as needed for nausea or vomiting.  30 tablet  0  . senna (SENOKOT) 8.6 MG tablet Take 2 tablets by mouth 2 (two) times daily.       . traMADol (ULTRAM) 50 MG tablet Every 6-8 hours (per patient)      . albuterol (PROVENTIL  HFA;VENTOLIN HFA) 108 (90 BASE) MCG/ACT inhaler Inhale 1-2 puffs into the lungs every 6 (six) hours as needed for wheezing or shortness of breath.  1 Inhaler  2  . bisacodyl (DULCOLAX) 10 MG suppository Place 1 suppository (10 mg total) rectally daily as needed for moderate constipation.  10 suppository  0  . calcium carbonate (OS-CAL) 1250 MG chewable tablet Chew 4 tablets by mouth daily.      . codeine 30 MG tablet Take 1 tablet (30 mg total) by mouth every 4 (four) hours as needed (cough).  80 tablet  0  . dronabinol (MARINOL) 2.5 MG capsule Take 1 capsule (2.5 mg total) by mouth 2 (two) times daily before a meal.  60 capsule  0  . glycerin adult 2 G SUPP Place 1 suppository rectally once as needed for moderate constipation.      Marland Kitchen levofloxacin (LEVAQUIN) 500 MG tablet Take 1 tablet (500 mg total) by mouth daily.  3 tablet  0  . loratadine (CLARITIN) 10 MG tablet Take  1 tablet (10 mg total) by mouth daily.  15 tablet  0  . methylPREDNIsolone (MEDROL DOSPACK) 4 MG tablet follow package directions  21 tablet  0  . OLANZapine (ZYPREXA) 10 MG tablet        No current facility-administered medications for this visit.   Facility-Administered Medications Ordered in Other Visits  Medication Dose Route Frequency Provider Last Rate Last Dose  . heparin lock flush 100 unit/mL  500 Units Intracatheter Once PRN Curt Bears, MD      . nivolumab (OPDIVO) 180 mg in sodium chloride 0.9 % 100 mL chemo infusion  3 mg/kg (Treatment Plan Actual) Intravenous Once Curt Bears, MD 118 mL/hr at 02/10/14 1418 180 mg at 02/10/14 1418  . sodium chloride 0.9 % injection 10 mL  10 mL Intracatheter PRN Curt Bears, MD        SURGICAL HISTORY:  Past Surgical History  Procedure Laterality Date  . Dilation and curettage of uterus  2004    x 3   . Tubal ligation    . Colonoscopy  04/05/2004    normal     REVIEW OF SYSTEMS:  Constitutional: positive for anorexia, fatigue and weight loss Eyes:  negative Ears, nose, mouth, throat, and face: negative Respiratory: positive for cough and dyspnea on exertion Cardiovascular: negative Gastrointestinal: positive for constipation and nausea Genitourinary:negative Integument/breast: negative Hematologic/lymphatic: negative Musculoskeletal:positive for muscle weakness Neurological: negative Behavioral/Psych: negative Endocrine: negative Allergic/Immunologic: negative   PHYSICAL EXAMINATION: General appearance: alert, cooperative, fatigued and no distress Head: Normocephalic, without obvious abnormality, atraumatic Neck: no adenopathy, no JVD, supple, symmetrical, trachea midline and thyroid not enlarged, symmetric, no tenderness/mass/nodules Lymph nodes: Cervical, supraclavicular, and axillary nodes normal. Resp: clear to auscultation bilaterally Back: symmetric, no curvature. ROM normal. No CVA tenderness. Cardio: regular rate and rhythm, S1, S2 normal, no murmur, click, rub or gallop GI: soft, non-tender; bowel sounds normal; no masses,  no organomegaly Extremities: extremities normal, atraumatic, no cyanosis or edema Neurologic: Alert and oriented X 3, normal strength and tone. Normal symmetric reflexes. Normal coordination and gait  ECOG PERFORMANCE STATUS: 2 - Symptomatic, <50% confined to bed  Blood pressure 109/65, pulse 60, temperature 98.4 F (36.9 C), temperature source Oral, resp. rate 18, height '5\' 3"'  (1.6 m), weight 131 lb 8 oz (59.648 kg), last menstrual period 01/14/2008, SpO2 98.00%.  LABORATORY DATA: Lab Results  Component Value Date   WBC 7.2 02/10/2014   HGB 7.4* 02/10/2014   HCT 23.2* 02/10/2014   MCV 85.9 02/10/2014   PLT 274 02/10/2014      Chemistry      Component Value Date/Time   NA 132* 02/10/2014 1140   NA 141 01/16/2014 0530   K 3.8 02/10/2014 1140   K 3.5* 01/16/2014 0530   CL 106 01/16/2014 0530   CO2 26 02/10/2014 1140   CO2 24 01/16/2014 0530   BUN 8.2 02/10/2014 1140   BUN 7 01/16/2014  0530   CREATININE 0.7 02/10/2014 1140   CREATININE 0.63 01/16/2014 0530   CREATININE 0.83 09/07/2013 1506      Component Value Date/Time   CALCIUM 8.4 02/10/2014 1140   CALCIUM 8.0* 01/16/2014 0530   ALKPHOS 62 02/10/2014 1140   ALKPHOS 66 11/14/2013 1057   AST 20 02/10/2014 1140   AST 12 11/14/2013 1057   ALT 18 02/10/2014 1140   ALT 15 11/14/2013 1057   BILITOT 1.12 02/10/2014 1140   BILITOT 1.4* 11/14/2013 1057       RADIOGRAPHIC STUDIES: Ct Chest W Contrast  12/31/2013   CLINICAL DATA:  History of lung cancer diagnosed in May 2015. Chemotherapy in progress. Radiation therapy complete. Shortness of breath. Right lower quadrant pain. Constipation.  EXAM: CT CHEST WITH CONTRAST  TECHNIQUE: Multidetector CT imaging of the chest was performed during intravenous contrast administration.  CONTRAST:  129m OMNIPAQUE IOHEXOL 300 MG/ML  SOLN  COMPARISON:  CT of the chest, abdomen and pelvis 11/22/2013.  FINDINGS: CT CHEST FINDINGS  Mediastinum: Bulky right supraclavicular lymphadenopathy has significantly increased compared to the prior examination. Image 6 of series 2 demonstrates an enlarging conglomeration of lymph nodes measuring 5.6 x 2.4 cm in the right supraclavicular region. Inferior aspect of the right internal jugular vein appears thrombosed, new compared to the prior examination. A right internal jugular double-lumen Port-A-Cath is present with tip terminating in the right atrium. The superior vena cava remains patent at this time. Compared to the prior examinations, there is significantly worsened mediastinal and left hilar lymphadenopathy. In the superior mediastinum markedly enlarged lymph nodes completely encase multiple vessels, including the innominate artery, best appreciated on image 13 of series 2. Markedly enlarged right paratracheal lymph node measuring 2.4 cm is significantly increased compared to the prior examination at which point it measured 17 mm. Subcarinal node is also  significantly increased compared to the prior examination, measuring up to 3.9 x 2.4 cm, as is extensive bulky left hilar lymphadenopathy. Heart size is normal. There is no significant pericardial fluid, thickening or pericardial calcification. Esophagus is unremarkable in appearance.  Lungs/Pleura: Multiple large pulmonary nodules and masses are noted in the left lower lobe, the largest mass or conglomeration of nodules measures 3.6 x 4.3 cm (image 30 of series 5). Numerous other smaller pulmonary nodules are seen throughout the lungs bilaterally, compatible with widespread metastatic disease. These have increased in size compared to the prior examination, with a specific example of this a 2.2 x 1.4 cm nodule in the medial aspect of the left upper lobe (image 16 of series 5), which previously measured 1.9 x 1.2 cm on prior study 11/22/2013. Some other nodules are stable in size. No definite new nodules are noted. Trace left pleural effusion layering dependently.  Musculoskeletal: There are no aggressive appearing lytic or blastic lesions noted in the visualized portions of the skeleton.  CT ABDOMEN AND PELVIS FINDINGS  Abdomen/Pelvis: Interval enlargement of 2 hypovascular liver lesions in the right lobe of the liver, largest of which measures 1.9 x 1.4 cm in segment 7 (image 46 of series 2), presumably enlarging hepatic metastases. No new hepatic lesions are noted. The appearance of the gallbladder, pancreas, bilateral adrenal glands and bilateral kidneys is unremarkable. The spleen appears borderline enlarged measuring up to 11.4 cm.  No significant volume of ascites. No pneumoperitoneum. No pathologic distention of small bowel. No lymphadenopathy noted in the abdomen or pelvis. Mild atherosclerosis throughout the abdominal and pelvic vasculature, without evidence of aneurysm. Uterus and ovaries are unremarkable in appearance. Urinary bladder is normal in appearance.  Musculoskeletal: Large lytic lesion measuring  approximately 2.6 x 2.1 cm in S1 again noted.  IMPRESSION: 1. Today's study demonstrates progression of disease, predominantly manifest by marked enlargement of right supraclavicular, mediastinal and left hilar lymphadenopathy, as well as several left lower lobe pulmonary nodules. Several other pulmonary nodules in the lungs bilaterally have also enlarged, while many others appear stable. No definite new pulmonary nodules are noted. 2. In addition, there has been some interval enlargement of 2 metastatic lesions in segment 7 of the liver. 3. Large  lytic lesion in S1 again noted, presumably a metastatic lesion. 4. Additional incidental findings, as above.   Electronically Signed   By: Vinnie Langton M.D.   On: 12/31/2013 16:53   Ct Abdomen Pelvis W Contrast  12/31/2013   CLINICAL DATA:  History of lung cancer diagnosed in May 2015. Chemotherapy in progress. Radiation therapy complete. Shortness of breath. Right lower quadrant pain. Constipation.  EXAM: CT CHEST WITH CONTRAST  TECHNIQUE: Multidetector CT imaging of the chest was performed during intravenous contrast administration.  CONTRAST:  162m OMNIPAQUE IOHEXOL 300 MG/ML  SOLN  COMPARISON:  CT of the chest, abdomen and pelvis 11/22/2013.  FINDINGS: CT CHEST FINDINGS  Mediastinum: Bulky right supraclavicular lymphadenopathy has significantly increased compared to the prior examination. Image 6 of series 2 demonstrates an enlarging conglomeration of lymph nodes measuring 5.6 x 2.4 cm in the right supraclavicular region. Inferior aspect of the right internal jugular vein appears thrombosed, new compared to the prior examination. A right internal jugular double-lumen Port-A-Cath is present with tip terminating in the right atrium. The superior vena cava remains patent at this time. Compared to the prior examinations, there is significantly worsened mediastinal and left hilar lymphadenopathy. In the superior mediastinum markedly enlarged lymph nodes completely  encase multiple vessels, including the innominate artery, best appreciated on image 13 of series 2. Markedly enlarged right paratracheal lymph node measuring 2.4 cm is significantly increased compared to the prior examination at which point it measured 17 mm. Subcarinal node is also significantly increased compared to the prior examination, measuring up to 3.9 x 2.4 cm, as is extensive bulky left hilar lymphadenopathy. Heart size is normal. There is no significant pericardial fluid, thickening or pericardial calcification. Esophagus is unremarkable in appearance.  Lungs/Pleura: Multiple large pulmonary nodules and masses are noted in the left lower lobe, the largest mass or conglomeration of nodules measures 3.6 x 4.3 cm (image 30 of series 5). Numerous other smaller pulmonary nodules are seen throughout the lungs bilaterally, compatible with widespread metastatic disease. These have increased in size compared to the prior examination, with a specific example of this a 2.2 x 1.4 cm nodule in the medial aspect of the left upper lobe (image 16 of series 5), which previously measured 1.9 x 1.2 cm on prior study 11/22/2013. Some other nodules are stable in size. No definite new nodules are noted. Trace left pleural effusion layering dependently.  Musculoskeletal: There are no aggressive appearing lytic or blastic lesions noted in the visualized portions of the skeleton.  CT ABDOMEN AND PELVIS FINDINGS  Abdomen/Pelvis: Interval enlargement of 2 hypovascular liver lesions in the right lobe of the liver, largest of which measures 1.9 x 1.4 cm in segment 7 (image 46 of series 2), presumably enlarging hepatic metastases. No new hepatic lesions are noted. The appearance of the gallbladder, pancreas, bilateral adrenal glands and bilateral kidneys is unremarkable. The spleen appears borderline enlarged measuring up to 11.4 cm.  No significant volume of ascites. No pneumoperitoneum. No pathologic distention of small bowel. No  lymphadenopathy noted in the abdomen or pelvis. Mild atherosclerosis throughout the abdominal and pelvic vasculature, without evidence of aneurysm. Uterus and ovaries are unremarkable in appearance. Urinary bladder is normal in appearance.  Musculoskeletal: Large lytic lesion measuring approximately 2.6 x 2.1 cm in S1 again noted.  IMPRESSION: 1. Today's study demonstrates progression of disease, predominantly manifest by marked enlargement of right supraclavicular, mediastinal and left hilar lymphadenopathy, as well as several left lower lobe pulmonary nodules. Several other pulmonary nodules  in the lungs bilaterally have also enlarged, while many others appear stable. No definite new pulmonary nodules are noted. 2. In addition, there has been some interval enlargement of 2 metastatic lesions in segment 7 of the liver. 3. Large lytic lesion in S1 again noted, presumably a metastatic lesion. 4. Additional incidental findings, as above.   Electronically Signed   By: Vinnie Langton M.D.   On: 12/31/2013 16:53   ASSESSMENT AND PLAN: This is a very pleasant 63 years old white female with:   1) stage IV non-small cell lung cancer, adenocarcinoma with metastatic disease to the bone, brain and liver. She is currently undergoing systemic chemotherapy with carboplatin, Alimta and Avastin is status post 5 cycles. She had some evidence for mild disease progression after cycle #3 especially in the liver lesions but the patient was asked to continue her treatment with the same regimen by Dr. Oretha Caprice. Her recent CT scan reveals evidence for disease progression within the lungs and liver as well as extensive bone metastasis. Patient was discussed with and also seen by Dr. Julien Nordmann. She is currently being treated with immunotherapy with Nivolumab.She is status post 3 cycles. She'll proceed with cycle #4 today as scheduled. She'll return in 2 weeks prior to cycle #5the restaging CT scan of her chest, abdomen and pelvis with  contrast to reevaluate her disease.  Her previous molecular studies showed positive ERBB3 amplification. One of the options to treat this abnormality this treatment with single agent oral Afatinib.  2) metastatic bone disease: The patient will continue treatment with Xgeva subcutaneously every 2 months. She was referred  to Dr. Alvan Dame with [facility] orthopedic for evaluation and close monitoring of the left hip lesion since the patient has transferred her care to [facility].  3) pain management: She will continue on tramadol 50 mg by mouth every 6 hours as needed for pain.  4) constipation and hemorrhoids: She will continue her current treatment with Dulcolax suppository in addition to MiraLAX on an as-needed basis.  5) nausea: Patient will be continued on Zyprexa 10 mg by mouth at bedtime. She's had significantly reduced intake of both food and fluids.   6) appetite stimulation: Patient will continue on Marinol 2.5 mg by mouth twice daily to them stimulate her appetite and improve her by mouth intake.    7) anemia: resolved-her hemoglobin today is 10.6 g/dL. We'll continue to monitor this closely.  All questions were answered. The patient knows to call the clinic with any problems, questions or concerns. We can certainly see the patient much sooner if necessary.   Disclaimer: This note was dictated with voice recognition software. Similar sounding words can inadvertently be transcribed and may not be corrected upon review. Carlton Adam, PA-C 02/24/2014  ADDENDUM: Hematology/Oncology Attending: I had a face to face encounter with the patient. I recommended her care plan. This is a very pleasant 63 years old white female with metastatic non-small cell lung cancer who is currently undergoing second line therapy with Nivolumab status post 3 cycles. She is here today to start cycle #4 of her treatment. The patient continues to complain of poor by mouth intake. She requires IV fluid for  hydration at regular basis. I recommended for the patient to proceed with cycle #4 today as scheduled. She is also undergoing palliative radiotherapy to the metastatic bone lesions in her back. She would come back for follow-up visit in 2 weeks after repeating CT scan of the chest, abdomen and pelvis for restaging of her  disease. She was advised to call immediately if she has any concerning symptoms in the interval.  Disclaimer: This note was dictated with voice recognition software. Similar sounding words can inadvertently be transcribed and may be missed upon review. Eilleen Kempf., MD 02/26/2014

## 2014-02-25 ENCOUNTER — Ambulatory Visit: Payer: BC Managed Care – PPO

## 2014-02-26 NOTE — Patient Instructions (Signed)
Continue Lasix and chemotherapy is scheduled Follow-up in 2 weeks with a restaging CT scan of the chest, abdomen and pelvis to reevaluate your disease

## 2014-02-28 ENCOUNTER — Ambulatory Visit: Payer: BC Managed Care – PPO

## 2014-03-01 ENCOUNTER — Encounter: Payer: Self-pay | Admitting: Radiation Oncology

## 2014-03-01 ENCOUNTER — Ambulatory Visit: Payer: BC Managed Care – PPO

## 2014-03-01 ENCOUNTER — Ambulatory Visit
Admission: RE | Admit: 2014-03-01 | Discharge: 2014-03-01 | Disposition: A | Discharge status: Home / Self Care | Payer: BC Managed Care – PPO | Source: Ambulatory Visit | Attending: Radiation Oncology | Admitting: Radiation Oncology

## 2014-03-01 VITALS — BP 111/69 | HR 129 | Temp 97.7°F | Resp 20 | Ht 63.0 in | Wt 119.9 lb

## 2014-03-01 DIAGNOSIS — E860 Dehydration: Secondary | ICD-10-CM

## 2014-03-01 DIAGNOSIS — Z510 Encounter for antineoplastic radiation therapy: Secondary | ICD-10-CM | POA: Diagnosis not present

## 2014-03-01 DIAGNOSIS — C3492 Malignant neoplasm of unspecified part of left bronchus or lung: Secondary | ICD-10-CM

## 2014-03-01 MED ORDER — HEPARIN SOD (PORK) LOCK FLUSH 100 UNIT/ML IV SOLN
500.0000 [IU] | Freq: Once | INTRAVENOUS | Status: AC
  Administered 2014-03-01: 500 [IU] via INTRAVENOUS
  Filled 2014-03-01: qty 5

## 2014-03-01 MED ORDER — PROCHLORPERAZINE MALEATE 10 MG PO TABS
10.0000 mg | ORAL_TABLET | Freq: Once | ORAL | Status: AC
  Administered 2014-03-01: 10 mg via ORAL

## 2014-03-01 MED ORDER — SODIUM CHLORIDE 0.9 % IJ SOLN
10.0000 mL | INTRAMUSCULAR | Status: DC | PRN
  Administered 2014-03-01: 10 mL via INTRAVENOUS
  Filled 2014-03-01: qty 10

## 2014-03-01 MED ORDER — PROCHLORPERAZINE MALEATE 10 MG PO TABS
ORAL_TABLET | ORAL | Status: AC
  Filled 2014-03-01: qty 1

## 2014-03-01 MED ORDER — SODIUM CHLORIDE 0.9 % IV SOLN
Freq: Once | INTRAVENOUS | Status: AC
  Administered 2014-03-01: 14:00:00 via INTRAVENOUS

## 2014-03-01 NOTE — Patient Instructions (Signed)
Dehydration, Adult Dehydration is when you lose more fluids from the body than you take in. Vital organs like the kidneys, brain, and heart cannot function without a proper amount of fluids and salt. Any loss of fluids from the body can cause dehydration.  CAUSES   Vomiting.  Diarrhea.  Excessive sweating.  Excessive urine output.  Fever. SYMPTOMS  Mild dehydration  Thirst.  Dry lips.  Slightly dry mouth. Moderate dehydration  Very dry mouth.  Sunken eyes.  Skin does not bounce back quickly when lightly pinched and released.  Dark urine and decreased urine production.  Decreased tear production.  Headache. Severe dehydration  Very dry mouth.  Extreme thirst.  Rapid, weak pulse (more than 100 beats per minute at rest).  Cold hands and feet.  Not able to sweat in spite of heat and temperature.  Rapid breathing.  Blue lips.  Confusion and lethargy.  Difficulty being awakened.  Minimal urine production.  No tears. DIAGNOSIS  Your caregiver will diagnose dehydration based on your symptoms and your exam. Blood and urine tests will help confirm the diagnosis. The diagnostic evaluation should also identify the cause of dehydration. TREATMENT  Treatment of mild or moderate dehydration can often be done at home by increasing the amount of fluids that you drink. It is best to drink small amounts of fluid more often. Drinking too much at one time can make vomiting worse. Refer to the home care instructions below. Severe dehydration needs to be treated at the hospital where you will probably be given intravenous (IV) fluids that contain water and electrolytes. HOME CARE INSTRUCTIONS   Ask your caregiver about specific rehydration instructions.  Drink enough fluids to keep your urine clear or pale yellow.  Drink small amounts frequently if you have nausea and vomiting.  Eat as you normally do.  Avoid:  Foods or drinks high in sugar.  Carbonated  drinks.  Juice.  Extremely hot or cold fluids.  Drinks with caffeine.  Fatty, greasy foods.  Alcohol.  Tobacco.  Overeating.  Gelatin desserts.  Wash your hands well to avoid spreading bacteria and viruses.  Only take over-the-counter or prescription medicines for pain, discomfort, or fever as directed by your caregiver.  Ask your caregiver if you should continue all prescribed and over-the-counter medicines.  Keep all follow-up appointments with your caregiver. SEEK MEDICAL CARE IF:  You have abdominal pain and it increases or stays in one area (localizes).  You have a rash, stiff neck, or severe headache.  You are irritable, sleepy, or difficult to awaken.  You are weak, dizzy, or extremely thirsty. SEEK IMMEDIATE MEDICAL CARE IF:   You are unable to keep fluids down or you get worse despite treatment.  You have frequent episodes of vomiting or diarrhea.  You have blood or green matter (bile) in your vomit.  You have blood in your stool or your stool looks black and tarry.  You have not urinated in 6 to 8 hours, or you have only urinated a small amount of very dark urine.  You have a fever.  You faint. MAKE SURE YOU:   Understand these instructions.  Will watch your condition.  Will get help right away if you are not doing well or get worse. Document Released: 04/01/2005 Document Revised: 06/24/2011 Document Reviewed: 11/19/2010 ExitCare Patient Information 2015 [employer]. This information is not intended to replace advice given to you by your health care provider. Make sure you discuss any questions you have with your health care   provider.  

## 2014-03-01 NOTE — Progress Notes (Signed)
Sherri Stafford has completed 11 fractions to her chest.  She denies pain at this time.  She is using a fentanyl patch.  She reports shortness of breath all the time.  Her oxygen sat today was 100%.  She reports a dry cough.  She denies a sore throat or trouble swallowing.  She reports drinking 3 boost yesterday and eating jello and grape salad.  She has lost 6 lbs from last week.  Orthostatic vitals taken: bp sitting 111/69, hr 129, bp standing 101/76, hr 141.  She would like IV fluids.  She had chemotherapy last Thursday.  Her skin on her chest and back is intact.  She is using radiaplex gel.

## 2014-03-01 NOTE — Progress Notes (Signed)
1425-Pt complaining of nausea and requesting Compazine 10 mg tablet.  Dr. Sondra Come notified and order received.

## 2014-03-01 NOTE — Progress Notes (Signed)
  Radiation Oncology         [phone] ________________________________  Name: Sherri Stafford MRN: [id number]  Date: 03/01/2014  DOB: [birth year]  Weekly Radiation Therapy Management  DIAGNOSIS: stage IV (T2a, N3, M1b) non-small cell lung cancer, adenocarcinoma   Current Dose: 27.5 Gy     Planned Dose:  35 Gy  Narrative . . . . . . . . The patient presents for routine under treatment assessment.                                   The patient these have problems with nausea and poor by mouth intake.  As a consequence she is dehydrated began will be setup for IV fluid supplementation this afternoon.   She denies any swallowing difficulties.                             Set-up films were reviewed.                                 The chart was checked. Physical Findings. . .  height is 5\' 3"  (1.6 m) and weight is 119 lb 14.4 oz (54.386 kg). Her oral temperature is 97.7 F (36.5 C). Her blood pressure is 111/69 and her pulse is 129. Her respiration is 20 and oxygen saturation is 100%. . The lungs are clear. The heart has a regular rhythm with increased rate. Impression . . . . . . . The patient is tolerating radiation. Plan . . . . . . . . . . . . Continue treatment as planned. IV fluid supplementation today. The patient did inform me that she will be undergoing SRS treatment at [facility] on Friday, November 20. On recent MRI she was found to have an additional lesion within the brain.  ________________________________   Blair Promise, PhD, MD

## 2014-03-02 ENCOUNTER — Ambulatory Visit: Payer: BC Managed Care – PPO

## 2014-03-02 ENCOUNTER — Ambulatory Visit
Admission: RE | Admit: 2014-03-02 | Discharge: 2014-03-02 | Disposition: A | Discharge status: Home / Self Care | Payer: BC Managed Care – PPO | Source: Ambulatory Visit | Attending: Radiation Oncology | Admitting: Radiation Oncology

## 2014-03-02 DIAGNOSIS — Z510 Encounter for antineoplastic radiation therapy: Secondary | ICD-10-CM | POA: Diagnosis not present

## 2014-03-03 ENCOUNTER — Ambulatory Visit
Admission: RE | Admit: 2014-03-03 | Discharge: 2014-03-03 | Disposition: A | Discharge status: Home / Self Care | Payer: BC Managed Care – PPO | Source: Ambulatory Visit | Attending: Radiation Oncology | Admitting: Radiation Oncology

## 2014-03-03 ENCOUNTER — Ambulatory Visit: Payer: BC Managed Care – PPO

## 2014-03-03 DIAGNOSIS — Z510 Encounter for antineoplastic radiation therapy: Secondary | ICD-10-CM | POA: Diagnosis not present

## 2014-03-04 ENCOUNTER — Ambulatory Visit: Payer: BC Managed Care – PPO

## 2014-03-06 ENCOUNTER — Ambulatory Visit
Admission: RE | Admit: 2014-03-06 | Discharge: 2014-03-06 | Disposition: A | Discharge status: Home / Self Care | Payer: BC Managed Care – PPO | Source: Ambulatory Visit | Attending: Radiation Oncology | Admitting: Radiation Oncology

## 2014-03-06 ENCOUNTER — Encounter: Payer: Self-pay | Admitting: Radiation Oncology

## 2014-03-06 VITALS — BP 124/73 | HR 120 | Temp 97.4°F | Resp 18 | Wt 121.3 lb

## 2014-03-06 DIAGNOSIS — Z510 Encounter for antineoplastic radiation therapy: Secondary | ICD-10-CM | POA: Diagnosis not present

## 2014-03-06 DIAGNOSIS — C3492 Malignant neoplasm of unspecified part of left bronchus or lung: Secondary | ICD-10-CM

## 2014-03-06 NOTE — Progress Notes (Signed)
Weekly rad txs 14/14 chest, no skin changes, using biafine daily, fatigues, no c/o pain, no coughing,  difficulty swallowing, does get nauseated,taking compazine now prn,  100% room air, is fatigued, 1 month f/u appt card given 10:09 AM

## 2014-03-06 NOTE — Progress Notes (Signed)
Weekly Management Note:  Site: Chest Current Dose:  3500  cGy Projected Dose: 3500  cGy  Narrative: The patient is seen today for routine under treatment assessment. CBCT/MVCT images/port films were reviewed. The chart was reviewed.   Her radiation therapy is completed.  She is without complaints today.  She denies dysphagia.  Her cough is improved.  Physical Examination:  Filed Vitals:   03/06/14 1006  BP: 124/73  Pulse: 120  Temp: 97.4 F (36.3 C)  Resp: 18  .  Weight: 121 lb 4.8 oz (55.021 kg).  No significant skin changes.  Impression: Radiation therapy well tolerated.  Plan: Follow visit with Dr. Sondra Come in one month.

## 2014-03-07 ENCOUNTER — Ambulatory Visit (HOSPITAL_COMMUNITY)
Admission: RE | Admit: 2014-03-07 | Discharge: 2014-03-07 | Disposition: A | Discharge status: Home / Self Care | Payer: BC Managed Care – PPO | Source: Ambulatory Visit | Attending: Physician Assistant | Admitting: Physician Assistant

## 2014-03-07 ENCOUNTER — Encounter (HOSPITAL_COMMUNITY): Payer: Self-pay

## 2014-03-07 DIAGNOSIS — R918 Other nonspecific abnormal finding of lung field: Secondary | ICD-10-CM | POA: Insufficient documentation

## 2014-03-07 DIAGNOSIS — C7951 Secondary malignant neoplasm of bone: Secondary | ICD-10-CM | POA: Insufficient documentation

## 2014-03-07 DIAGNOSIS — D7389 Other diseases of spleen: Secondary | ICD-10-CM | POA: Diagnosis not present

## 2014-03-07 DIAGNOSIS — C3492 Malignant neoplasm of unspecified part of left bronchus or lung: Secondary | ICD-10-CM

## 2014-03-07 MED ORDER — IOHEXOL 300 MG/ML  SOLN
100.0000 mL | Freq: Once | INTRAMUSCULAR | Status: AC | PRN
  Administered 2014-03-07: 100 mL via INTRAVENOUS

## 2014-03-08 ENCOUNTER — Other Ambulatory Visit: Payer: Self-pay | Admitting: Internal Medicine

## 2014-03-08 DIAGNOSIS — C3490 Malignant neoplasm of unspecified part of unspecified bronchus or lung: Secondary | ICD-10-CM

## 2014-03-09 ENCOUNTER — Ambulatory Visit: Payer: BC Managed Care – PPO

## 2014-03-11 ENCOUNTER — Encounter: Payer: Self-pay | Admitting: Physician Assistant

## 2014-03-11 ENCOUNTER — Ambulatory Visit: Payer: BC Managed Care – PPO | Admitting: Physician Assistant

## 2014-03-11 ENCOUNTER — Telehealth: Payer: Self-pay | Admitting: Internal Medicine

## 2014-03-11 ENCOUNTER — Ambulatory Visit (HOSPITAL_BASED_OUTPATIENT_CLINIC_OR_DEPARTMENT_OTHER): Payer: BC Managed Care – PPO | Admitting: Physician Assistant

## 2014-03-11 ENCOUNTER — Ambulatory Visit: Payer: BC Managed Care – PPO

## 2014-03-11 ENCOUNTER — Ambulatory Visit (HOSPITAL_BASED_OUTPATIENT_CLINIC_OR_DEPARTMENT_OTHER): Payer: BC Managed Care – PPO

## 2014-03-11 ENCOUNTER — Encounter: Payer: Self-pay | Admitting: Nutrition

## 2014-03-11 ENCOUNTER — Encounter: Payer: Self-pay | Admitting: Oncology

## 2014-03-11 ENCOUNTER — Other Ambulatory Visit: Payer: BC Managed Care – PPO

## 2014-03-11 ENCOUNTER — Other Ambulatory Visit (HOSPITAL_BASED_OUTPATIENT_CLINIC_OR_DEPARTMENT_OTHER): Payer: BC Managed Care – PPO

## 2014-03-11 VITALS — BP 106/80 | HR 121 | Temp 97.8°F | Resp 18 | Ht 63.0 in | Wt 118.7 lb

## 2014-03-11 VITALS — BP 98/58 | HR 97 | Resp 18

## 2014-03-11 DIAGNOSIS — C7951 Secondary malignant neoplasm of bone: Secondary | ICD-10-CM

## 2014-03-11 DIAGNOSIS — Z79899 Other long term (current) drug therapy: Secondary | ICD-10-CM

## 2014-03-11 DIAGNOSIS — C3432 Malignant neoplasm of lower lobe, left bronchus or lung: Secondary | ICD-10-CM

## 2014-03-11 DIAGNOSIS — K5900 Constipation, unspecified: Secondary | ICD-10-CM

## 2014-03-11 DIAGNOSIS — E860 Dehydration: Secondary | ICD-10-CM

## 2014-03-11 DIAGNOSIS — C787 Secondary malignant neoplasm of liver and intrahepatic bile duct: Secondary | ICD-10-CM

## 2014-03-11 DIAGNOSIS — K649 Unspecified hemorrhoids: Secondary | ICD-10-CM

## 2014-03-11 DIAGNOSIS — C7931 Secondary malignant neoplasm of brain: Secondary | ICD-10-CM

## 2014-03-11 DIAGNOSIS — C3490 Malignant neoplasm of unspecified part of unspecified bronchus or lung: Secondary | ICD-10-CM

## 2014-03-11 DIAGNOSIS — Z5112 Encounter for antineoplastic immunotherapy: Secondary | ICD-10-CM

## 2014-03-11 DIAGNOSIS — I2699 Other pulmonary embolism without acute cor pulmonale: Secondary | ICD-10-CM

## 2014-03-11 DIAGNOSIS — Z95828 Presence of other vascular implants and grafts: Secondary | ICD-10-CM

## 2014-03-11 DIAGNOSIS — R630 Anorexia: Secondary | ICD-10-CM

## 2014-03-11 DIAGNOSIS — C3492 Malignant neoplasm of unspecified part of left bronchus or lung: Secondary | ICD-10-CM

## 2014-03-11 LAB — CBC WITH DIFFERENTIAL/PLATELET
BASO%: 0.6 % (ref 0.0–2.0)
Basophils Absolute: 0 10*3/uL (ref 0.0–0.1)
EOS ABS: 0.1 10*3/uL (ref 0.0–0.5)
EOS%: 2.2 % (ref 0.0–7.0)
HCT: 31.7 % — ABNORMAL LOW (ref 34.8–46.6)
HGB: 10.2 g/dL — ABNORMAL LOW (ref 11.6–15.9)
LYMPH%: 6.6 % — AB (ref 14.0–49.7)
MCH: 26.8 pg (ref 25.1–34.0)
MCHC: 32.3 g/dL (ref 31.5–36.0)
MCV: 83 fL (ref 79.5–101.0)
MONO#: 0.5 10*3/uL (ref 0.1–0.9)
MONO%: 8.6 % (ref 0.0–14.0)
NEUT%: 82 % — ABNORMAL HIGH (ref 38.4–76.8)
NEUTROS ABS: 4.8 10*3/uL (ref 1.5–6.5)
PLATELETS: 388 10*3/uL (ref 145–400)
RBC: 3.82 10*6/uL (ref 3.70–5.45)
RDW: 19.3 % — ABNORMAL HIGH (ref 11.2–14.5)
WBC: 5.8 10*3/uL (ref 3.9–10.3)
lymph#: 0.4 10*3/uL — ABNORMAL LOW (ref 0.9–3.3)

## 2014-03-11 LAB — COMPREHENSIVE METABOLIC PANEL (CC13)
ALBUMIN: 2.7 g/dL — AB (ref 3.5–5.0)
ALT: 12 U/L (ref 0–55)
AST: 15 U/L (ref 5–34)
Alkaline Phosphatase: 85 U/L (ref 40–150)
Anion Gap: 11 mEq/L (ref 3–11)
BILIRUBIN TOTAL: 0.85 mg/dL (ref 0.20–1.20)
BUN: 6.7 mg/dL — ABNORMAL LOW (ref 7.0–26.0)
CO2: 22 mEq/L (ref 22–29)
Calcium: 8.6 mg/dL (ref 8.4–10.4)
Chloride: 103 mEq/L (ref 98–109)
Creatinine: 0.7 mg/dL (ref 0.6–1.1)
Glucose: 129 mg/dl (ref 70–140)
Potassium: 4 mEq/L (ref 3.5–5.1)
Sodium: 136 mEq/L (ref 136–145)
TOTAL PROTEIN: 6.7 g/dL (ref 6.4–8.3)

## 2014-03-11 MED ORDER — ONDANSETRON 8 MG/50ML IVPB (CHCC)
8.0000 mg | Freq: Once | INTRAVENOUS | Status: AC
  Administered 2014-03-11: 8 mg via INTRAVENOUS

## 2014-03-11 MED ORDER — SODIUM CHLORIDE 0.9 % IJ SOLN
10.0000 mL | INTRAMUSCULAR | Status: DC | PRN
  Administered 2014-03-11: 10 mL via INTRAVENOUS
  Filled 2014-03-11: qty 10

## 2014-03-11 MED ORDER — HEPARIN SOD (PORK) LOCK FLUSH 100 UNIT/ML IV SOLN
500.0000 [IU] | Freq: Once | INTRAVENOUS | Status: AC | PRN
  Administered 2014-03-11: 500 [IU]
  Filled 2014-03-11: qty 5

## 2014-03-11 MED ORDER — ONDANSETRON 8 MG/NS 50 ML IVPB
INTRAVENOUS | Status: AC
  Filled 2014-03-11: qty 8

## 2014-03-11 MED ORDER — SODIUM CHLORIDE 0.9 % IV SOLN
3.0000 mg/kg | Freq: Once | INTRAVENOUS | Status: AC
  Administered 2014-03-11: 180 mg via INTRAVENOUS
  Filled 2014-03-11: qty 18

## 2014-03-11 MED ORDER — SODIUM CHLORIDE 0.9 % IJ SOLN
10.0000 mL | INTRAMUSCULAR | Status: DC | PRN
  Administered 2014-03-11: 10 mL
  Filled 2014-03-11: qty 10

## 2014-03-11 MED ORDER — SODIUM CHLORIDE 0.9 % IV SOLN
Freq: Once | INTRAVENOUS | Status: AC
  Administered 2014-03-11: 12:00:00 via INTRAVENOUS

## 2014-03-11 NOTE — Progress Notes (Signed)
Patient was given samples of clear liquid nutrition supplements. Provided patient with purchasing information. Provided contact information for nutrition questions.

## 2014-03-11 NOTE — Telephone Encounter (Signed)
gv adn printed appt sched and avs for pt for DEc...sed added tx.

## 2014-03-11 NOTE — Patient Instructions (Signed)
Increase your intake of bothe food and especially fluids Your CT scan overall revealed stable to slight improvement in your disease. 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

## 2014-03-11 NOTE — Patient Instructions (Signed)
[facility] Discharge Instructions for Patients Receiving Chemotherapy  Today you received the following chemotherapy agents Nivolumab.  To help prevent nausea and vomiting after your treatment, we encourage you to take your nausea medication.   If you develop nausea and vomiting that is not controlled by your nausea medication, call the clinic.   BELOW ARE SYMPTOMS THAT SHOULD BE REPORTED IMMEDIATELY:  *FEVER GREATER THAN 100.5 F  *CHILLS WITH OR WITHOUT FEVER  NAUSEA AND VOMITING THAT IS NOT CONTROLLED WITH YOUR NAUSEA MEDICATION  *UNUSUAL SHORTNESS OF BREATH  *UNUSUAL BRUISING OR BLEEDING  TENDERNESS IN MOUTH AND THROAT WITH OR WITHOUT PRESENCE OF ULCERS  *URINARY PROBLEMS  *BOWEL PROBLEMS  UNUSUAL RASH Items with * indicate a potential emergency and should be followed up as soon as possible.  Feel free to call the clinic you have any questions or concerns. The clinic phone number is [phone].

## 2014-03-11 NOTE — Progress Notes (Addendum)
[place] Telephone:([phone]) [phone]   Fax:([phone]  SHARED VISIT PROGRESS NOTE  [name], MD [place] [address]  DIAGNOSIS: stage IV (T2a, N3, M1b) non-small cell lung cancer, adenocarcinoma presented with large left lower lobe lung mass in addition to bilateral pulmonary nodules and mediastinal and supraclavicular lymphadenopathy diagnosed in May of 2015.  Genomic Alterations Identified? ERBB3 amplification CDK4 amplification IDH2 R172S KRAS G12D MDM2 amplification RBM10 [id number] TERC amplification - equivocal? Additional Disease-relevant Genes with No Reportable Alterations Identified? RET ALK BRAF ERBB2 MET EGFR  PRIOR THERAPY:  1) status post palliative [facility] to the [facility]  status post stereotactic radiotherapy to brain lesions [place] 2) Systemic chemotherapy with carboplatin for AUC of 5, Alimta 500 mg/M2 and Avastin 15 mg/KG every 3 weeks, status post 5 cycles. [facility]   CURRENT THERAPY:  1) immunotherapy with Nivolmab at 3 mg/kg given every 3 weeks. Status post 4 cycles  2) Xgeva 120 mcg subcutaneously every 2 months.  INTERVAL HISTORY: Sherri LACKMAN89y.o. female returns to the clinic today for followup visit accompanied by her significant other. The patient was seen for initial evaluation at the multidisciplinary thoracic oncology clinic on 09/01/2013 after she was diagnosed with metastatic non-small cell lung cancer. She [facility] at that time [facility] She was seen by Dr. DOretha Capriceand was started on treatment with carboplatin, Alimta and Avastin status post 4 cycles. Repeat CT scan of the chest, abdomen and  pelvis after cycle #3 on 11/22/2013 showed multiple bilateral pulmonary nodules and masses compatible with metastatic disease and compared to the previous scan on 08/12/2013 many of these nodule had slightly decreased in size. There was also mediastinal and bilateral hilar lymphadenopathy slightly decreased compared to the prior scan. The left hilar and mediastinal lymph nodes remain similar in size to the prior study. There is a new fracture of the right ninth rib suspicious for a pathologic fracture. CT scan of the abdomen and pelvis on the same day showed interval increase in size of 2 out of the 3 hypodense lesions within the liver concerning for worsening metastatic disease. The lesion within the right hepatic lobe measured 1.7 x 1.6 CM compared to 1.2 x 1.1 CM on the previous scan. An additional right hepatic lesion measured 1.2 x 0.9 CM compared to 0.8 x 0.7 CM and a lesion within the left hepatic lobe measured 0.9 x 0.6 CM compared to 0.9 x 0.5 CM previously. There was no evidence for new hepatic lesions, unchanged lytic lesions within the L3 vertebral body and pelvis consistent with osseous metastatic disease. Dr. DOretha Capricerecommended for the patient to continue her current systemic chemotherapy with carboplatin, Alimta and Avastin. She received cycle #4 on 11/22/2013.  [facility]. [place] cycle [place] of the chest, abdomen and pelvis and presents revealed evidence for disease progression.   She is currently being treated with immunotherapy with Nivolumab status post 4 cycles. She denies any change in her baseline shortness of breath, denies skin rash or diarrhea. She reports that she is  taking Senokot 1-2 tablets daily  To avoid constipation. She will sometimes have 1 episode of loose stool/diarrhea as a result but denies any other episodes of diarrhea. She  continues to have poor by mouth intake both food and fluids. She did eat relatively well on Thanksgiving but did not drink fluids very well. She completed her palliative radiation therapy. She reports improvement in her cough and her shortness of breath. She requests IV fluids again today.  Patient reports that she is to see Dr. Alvan Dame in December regarding her left leg and possible impending fracture. She recently had a restaging CT scan of the chest, abdomen and pelvis and presents to discuss the results. She is currently being treated for a pulmonary embolism diagnosed on 01/14/2014 and is currently on Lovenox injections. She presents to proceed with cycle #5 of her immunotherapy with Nivolumab.     MEDICAL HISTORY: Past Medical History  Diagnosis Date  . Arthritis   . Bilateral ovarian cysts   . GERD (gastroesophageal reflux disease)   . Strain of hip flexor 06/2013    left side torn  . Cancer     lung ca  . Bone metastases     to left hip and spine  . Radiation     at [facility] to left hip, sacrum and brain    ALLERGIES:  is allergic to other; ketoconazole; lorazepam; morphine and related; nystatin; sporanox; tylenol; vitamin d; and zofran.  MEDICATIONS:  Current Outpatient Prescriptions  Medication Sig Dispense Refill  . docusate sodium (COLACE) 100 MG capsule Take 100 mg by mouth 2 (two) times daily.      Marland Kitchen enoxaparin (LOVENOX) 100 MG/ML injection INJECT 0.9ML SUBCUTANEOUSLY EVERY DAY FOR 30 DAYS  30 Syringe  0  . esomeprazole (NEXIUM) 40 MG capsule Take 1 capsule (40 mg total) by mouth 2 (two) times daily before a meal.  60 capsule  0  . fentaNYL (DURAGESIC - DOSED MCG/HR) 50 MCG/HR Place 1 patch (50 mcg total) onto the skin every 3 (three) days.  5 patch  0  . gabapentin (NEURONTIN) 600 MG tablet Take 600 mg by mouth 3 (three) times daily.      . polyethylene glycol (MIRALAX / GLYCOLAX) packet Take 17 g by mouth daily as needed for moderate constipation.      . polyvinyl alcohol  (LIQUIFILM TEARS) 1.4 % ophthalmic solution Place 1 drop into both eyes as needed for dry eyes.       Marland Kitchen prochlorperazine (COMPAZINE) 10 MG tablet Take 1 tablet (10 mg total) by mouth every 6 (six) hours as needed for nausea or vomiting.  30 tablet  0  . senna (SENOKOT) 8.6 MG tablet Take 2 tablets by mouth 2 (two) times daily.       . traMADol (ULTRAM) 50 MG tablet Every 6-8 hours (per patient)      . albuterol (PROVENTIL HFA;VENTOLIN HFA) 108 (90 BASE) MCG/ACT inhaler Inhale 1-2 puffs into the lungs every 6 (six) hours as needed for wheezing or shortness of breath.  1 Inhaler  2  . bisacodyl (DULCOLAX) 10 MG suppository Place 1 suppository (10 mg total) rectally daily as needed for moderate constipation.  10 suppository  0  . calcium carbonate (OS-CAL) 1250 MG chewable tablet Chew 4 tablets by mouth daily.      . codeine 30 MG tablet Take 1 tablet (30 mg total) by mouth every 4 (four) hours as needed (cough).  80 tablet  0  . dronabinol (MARINOL) 2.5 MG  capsule Take 1 capsule (2.5 mg total) by mouth 2 (two) times daily before a meal.  60 capsule  0  . glycerin adult 2 G SUPP Place 1 suppository rectally once as needed for moderate constipation.      Marland Kitchen levofloxacin (LEVAQUIN) 500 MG tablet Take 1 tablet (500 mg total) by mouth daily.  3 tablet  0  . loratadine (CLARITIN) 10 MG tablet Take 1 tablet (10 mg total) by mouth daily.  15 tablet  0  . methylPREDNIsolone (MEDROL DOSPACK) 4 MG tablet follow package directions  21 tablet  0  . OLANZapine (ZYPREXA) 10 MG tablet        No current facility-administered medications for this visit.   Facility-Administered Medications Ordered in Other Visits  Medication Dose Route Frequency Provider Last Rate Last Dose  . heparin lock flush 100 unit/mL  500 Units Intracatheter Once PRN Curt Bears, MD      . nivolumab (OPDIVO) 180 mg in sodium chloride 0.9 % 100 mL chemo infusion  3 mg/kg (Treatment Plan Actual) Intravenous Once Curt Bears, MD 118 mL/hr  at 02/10/14 1418 180 mg at 02/10/14 1418  . sodium chloride 0.9 % injection 10 mL  10 mL Intracatheter PRN Curt Bears, MD        SURGICAL HISTORY:  Past Surgical History  Procedure Laterality Date  . Dilation and curettage of uterus  2004    x 3   . Tubal ligation    . Colonoscopy  04/05/2004    normal     REVIEW OF SYSTEMS:  Constitutional: positive for anorexia, fatigue and weight loss Eyes: negative Ears, nose, mouth, throat, and face: negative Respiratory: positive for cough and dyspnea on exertion Cardiovascular: negative Gastrointestinal: positive for constipation and nausea Genitourinary:negative Integument/breast: negative Hematologic/lymphatic: negative Musculoskeletal:positive for muscle weakness Neurological: negative Behavioral/Psych: negative Endocrine: negative Allergic/Immunologic: negative   PHYSICAL EXAMINATION: General appearance: alert, cooperative, fatigued and no distress Head: Normocephalic, without obvious abnormality, atraumatic Neck: no adenopathy, no JVD, supple, symmetrical, trachea midline and thyroid not enlarged, symmetric, no tenderness/mass/nodules Lymph nodes: Cervical, supraclavicular, and axillary nodes normal. Resp: clear to auscultation bilaterally Back: symmetric, no curvature. ROM normal. No CVA tenderness. Cardio: regular rate and rhythm, S1, S2 normal, no murmur, click, rub or gallop GI: soft, non-tender; bowel sounds normal; no masses,  no organomegaly Extremities: extremities normal, atraumatic, no cyanosis or edema Neurologic: Alert and oriented X 3, normal strength and tone. Normal symmetric reflexes. Normal coordination and gait  ECOG PERFORMANCE STATUS: 2 - Symptomatic, <50% confined to bed  Blood pressure 109/65, pulse 60, temperature 98.4 F (36.9 C), temperature source Oral, resp. rate 18, height '5\' 3"'  (1.6 m), weight 131 lb 8 oz (59.648 kg), last menstrual period 01/14/2008, SpO2 98.00%.  LABORATORY DATA: Lab  Results  Component Value Date   WBC 7.2 02/10/2014   HGB 7.4* 02/10/2014   HCT 23.2* 02/10/2014   MCV 85.9 02/10/2014   PLT 274 02/10/2014      Chemistry      Component Value Date/Time   NA 132* 02/10/2014 1140   NA 141 01/16/2014 0530   K 3.8 02/10/2014 1140   K 3.5* 01/16/2014 0530   CL 106 01/16/2014 0530   CO2 26 02/10/2014 1140   CO2 24 01/16/2014 0530   BUN 8.2 02/10/2014 1140   BUN 7 01/16/2014 0530   CREATININE 0.7 02/10/2014 1140   CREATININE 0.63 01/16/2014 0530   CREATININE 0.83 09/07/2013 1506      Component Value Date/Time  CALCIUM 8.4 02/10/2014 1140   CALCIUM 8.0* 01/16/2014 0530   ALKPHOS 62 02/10/2014 1140   ALKPHOS 66 11/14/2013 1057   AST 20 02/10/2014 1140   AST 12 11/14/2013 1057   ALT 18 02/10/2014 1140   ALT 15 11/14/2013 1057   BILITOT 1.12 02/10/2014 1140   BILITOT 1.4* 11/14/2013 1057       RADIOGRAPHIC STUDIES: Ct Chest W Contrast  03/07/2014   CLINICAL DATA:  63 year old female with history of left-sided lung cancer and bone metastasis diagnosed in 2015. Chemotherapy in progress.  EXAM: CT CHEST, ABDOMEN, AND PELVIS WITH CONTRAST  TECHNIQUE: Multidetector CT imaging of the chest, abdomen and pelvis was performed following the standard protocol during bolus administration of intravenous contrast.  CONTRAST:  181m OMNIPAQUE IOHEXOL 300 MG/ML  SOLN  COMPARISON:  CT of abdomen and pelvis 12/31/2013. PET-CT 09/09/2013.  FINDINGS: CT CHEST FINDINGS  Mediastinum: Compared to prior examinations, the extensive mediastinal and hilar lymphadenopathy noted on the prior study has decreased. Specific examples include a 2 cm short axis low right paratracheal lymph node (previously 2.4 cm), and a 3.5 x 2.0 cm subcarinal nodal mass (previously 3.9 x 2.4 cm). Bulky left hilar and infrahilar lymphadenopathy has also significantly decreased, best appreciated on image 30 of series 2, but is irregular in shape and therefore difficult to measure. Heart size is normal. Small volume  of pericardial fluid and new pericardial enhancement. This is unlikely to be of hemodynamic significance at this time. Right-sided double-lumen internal jugular porta cath with tip terminating in the right atrium. Esophagus is unremarkable in appearance.  Lungs/Pleura: Innumerable pulmonary nodules and masses are again noted, generally decreased in size compared to the prior examination. A notable exception of this is an enlarging 1.5 x 1.4 cm nodule in the periphery of the right lower lobe (image 37 of series 4). Additionally, the largest of the masses in the left lower lobe appears essentially unchanged compared to the prior examination, currently measuring 3.9 x 4.0 cm (image 38 of series 4), previously 4.3 x 3.6 cm). Nodules that have decreased in size compared to the prior examination include a 1.2 x 1.8 cm left upper lobe nodule (image 22 of series 4), which previously measured 1.9 x 2.2 cm, and a 1.5 x 0.9 cm nodule in the medial aspect of the anterior right upper lobe (image 22 of series 4), which previously measured 1.6 x 1.5 cm. Some nodules appear partially cavitary, including a 1.7 x 1.6 cm nodule in the periphery of the left upper lobe (image 20 of series 4). Small left pleural effusion appears slightly increased compared to the prior study, presumably malignant.  Musculoskeletal: There are no aggressive appearing lytic or blastic lesions noted in the visualized portions of the skeleton.  CT ABDOMEN AND PELVIS FINDINGS  Hepatobiliary: Previously noted metastatic lesions in the right lobe of the liver appear slightly less well-defined than the prior study, with the largest of these measuring 16 x 11 mm in segment 7 on today's examination (image 50 of series 2), slightly decreased compared to the prior study. No new hepatic lesions are noted. No intra or extrahepatic biliary ductal dilatation. Gallbladder is normal in appearance.  Pancreas: Unremarkable.  Spleen: New 1.0 x 1.3 cm low-attenuation lesion in  the medial aspect of the inferior spleen (image 59 of series 2), concerning for a new splenic metastasis.  Adrenals/Urinary Tract: Bilateral adrenal glands are normal in appearance. Bilateral kidneys are normal in appearance. No hydroureteronephrosis. Urinary bladder is normal in  appearance.  Stomach/Bowel: Normal appearance of the stomach. No pathologic dilatation of small bowel or colon.  Vascular/Lymphatic: Mild atherosclerosis throughout the abdominal and pelvic vasculature, without evidence of aneurysm or dissection.  Reproductive: Uterus and ovaries are unremarkable in appearance.  Other: No significant volume of ascites.  No pneumoperitoneum.  Musculoskeletal: Again noted are multiple mixed lytic and sclerotic lesions in the visualized axial and appendicular skeleton. The largest of these is centered in the sacrum at the level of S1, currently measuring 3.7 x 2.8 cm.  IMPRESSION: 1. Today's study demonstrates a mixed response to therapy. Overall, in the chest, the majority of the pulmonary nodules appear smaller than the prior examination, with some exceptions which are stable to slightly increased in size, as detailed above. Mediastinal and left hilar adenopathy has significantly decreased. Metastatic lesions in the liver are slightly less prominent, and numerous osseous lesions are generally similar to the prior study, with some interval changes in size and appearance, which are presumably treatment related. No definite new osseous lesions noted. There is 1 new lesion in the spleen measuring 1 x 1.3 cm, which is nonspecific, but concerning for a new metastatic lesion. 2. Additional incidental findings, as above.   Electronically Signed   By: Vinnie Langton M.D.   On: 03/07/2014 11:39   Ct Abdomen Pelvis W Contrast  03/07/2014   CLINICAL DATA:  63 year old female with history of left-sided lung cancer and bone metastasis diagnosed in 2015. Chemotherapy in progress.  EXAM: CT CHEST, ABDOMEN, AND PELVIS  WITH CONTRAST  TECHNIQUE: Multidetector CT imaging of the chest, abdomen and pelvis was performed following the standard protocol during bolus administration of intravenous contrast.  CONTRAST:  151m OMNIPAQUE IOHEXOL 300 MG/ML  SOLN  COMPARISON:  CT of abdomen and pelvis 12/31/2013. PET-CT 09/09/2013.  FINDINGS: CT CHEST FINDINGS  Mediastinum: Compared to prior examinations, the extensive mediastinal and hilar lymphadenopathy noted on the prior study has decreased. Specific examples include a 2 cm short axis low right paratracheal lymph node (previously 2.4 cm), and a 3.5 x 2.0 cm subcarinal nodal mass (previously 3.9 x 2.4 cm). Bulky left hilar and infrahilar lymphadenopathy has also significantly decreased, best appreciated on image 30 of series 2, but is irregular in shape and therefore difficult to measure. Heart size is normal. Small volume of pericardial fluid and new pericardial enhancement. This is unlikely to be of hemodynamic significance at this time. Right-sided double-lumen internal jugular porta cath with tip terminating in the right atrium. Esophagus is unremarkable in appearance.  Lungs/Pleura: Innumerable pulmonary nodules and masses are again noted, generally decreased in size compared to the prior examination. A notable exception of this is an enlarging 1.5 x 1.4 cm nodule in the periphery of the right lower lobe (image 37 of series 4). Additionally, the largest of the masses in the left lower lobe appears essentially unchanged compared to the prior examination, currently measuring 3.9 x 4.0 cm (image 38 of series 4), previously 4.3 x 3.6 cm). Nodules that have decreased in size compared to the prior examination include a 1.2 x 1.8 cm left upper lobe nodule (image 22 of series 4), which previously measured 1.9 x 2.2 cm, and a 1.5 x 0.9 cm nodule in the medial aspect of the anterior right upper lobe (image 22 of series 4), which previously measured 1.6 x 1.5 cm. Some nodules appear partially  cavitary, including a 1.7 x 1.6 cm nodule in the periphery of the left upper lobe (image 20 of series 4). Small  left pleural effusion appears slightly increased compared to the prior study, presumably malignant.  Musculoskeletal: There are no aggressive appearing lytic or blastic lesions noted in the visualized portions of the skeleton.  CT ABDOMEN AND PELVIS FINDINGS  Hepatobiliary: Previously noted metastatic lesions in the right lobe of the liver appear slightly less well-defined than the prior study, with the largest of these measuring 16 x 11 mm in segment 7 on today's examination (image 50 of series 2), slightly decreased compared to the prior study. No new hepatic lesions are noted. No intra or extrahepatic biliary ductal dilatation. Gallbladder is normal in appearance.  Pancreas: Unremarkable.  Spleen: New 1.0 x 1.3 cm low-attenuation lesion in the medial aspect of the inferior spleen (image 59 of series 2), concerning for a new splenic metastasis.  Adrenals/Urinary Tract: Bilateral adrenal glands are normal in appearance. Bilateral kidneys are normal in appearance. No hydroureteronephrosis. Urinary bladder is normal in appearance.  Stomach/Bowel: Normal appearance of the stomach. No pathologic dilatation of small bowel or colon.  Vascular/Lymphatic: Mild atherosclerosis throughout the abdominal and pelvic vasculature, without evidence of aneurysm or dissection.  Reproductive: Uterus and ovaries are unremarkable in appearance.  Other: No significant volume of ascites.  No pneumoperitoneum.  Musculoskeletal: Again noted are multiple mixed lytic and sclerotic lesions in the visualized axial and appendicular skeleton. The largest of these is centered in the sacrum at the level of S1, currently measuring 3.7 x 2.8 cm.  IMPRESSION: 1. Today's study demonstrates a mixed response to therapy. Overall, in the chest, the majority of the pulmonary nodules appear smaller than the prior examination, with some exceptions  which are stable to slightly increased in size, as detailed above. Mediastinal and left hilar adenopathy has significantly decreased. Metastatic lesions in the liver are slightly less prominent, and numerous osseous lesions are generally similar to the prior study, with some interval changes in size and appearance, which are presumably treatment related. No definite new osseous lesions noted. There is 1 new lesion in the spleen measuring 1 x 1.3 cm, which is nonspecific, but concerning for a new metastatic lesion. 2. Additional incidental findings, as above.   Electronically Signed   By: Vinnie Langton M.D.   On: 03/07/2014 11:39    ASSESSMENT AND PLAN: This is a very pleasant 63 years old white female with:   1) stage IV non-small cell lung cancer, adenocarcinoma with metastatic disease to the bone, brain and liver. She is currently undergoing systemic chemotherapy with carboplatin, Alimta and Avastin is status post 5 cycles. She had some evidence for mild disease progression after cycle #3 especially in the liver lesions but the patient was asked to continue her treatment with the same regimen by Dr. Oretha Caprice. The restaging CT scan revealed evidence for disease progression within the lungs and liver as well as extensive bone metastasis. She is now being treated with immunotherapy with Nivolumab 62m/kg given every 2 weeks. She is status post 4 cycles. Patient was discussed with and also seen by Dr. MJulien Nordmann  Her recent restaging CT scan of the chest, abdomen and pelvis with contrast revealed a mixed response to therapy with overall stable to decrease in size of nodules within the chest and liver. There is a new area in the spleen that is nonspecific and we will follow this on future CT scans. These results were reviewed with the patient and her significant other.She'll proceed with cycle #5 today as scheduled. She'll return in 2 weeks prior to cycle #6.  Her previous  molecular studies showed positive ERBB3  amplification. One of the options to treat this abnormality this treatment with single agent oral Afatinib.  2) metastatic bone disease: The patient will continue treatment with Xgeva subcutaneously every 2 months. She was referred  to Dr. Alvan Dame with [facility] orthopedic for evaluation and close monitoring of the left hip lesion since the patient has transferred her care to [facility].  3) pain management: She will continue on tramadol 50 mg by mouth every 6 hours as needed for pain.  4) constipation and hemorrhoids: She will continue her current treatment with Dulcolax suppository in addition to MiraLAX on an as-needed basis.  5) nausea: Patient will be continued on Zyprexa 10 mg by mouth at bedtime. She's had significantly reduced intake of both food and fluids.   6) appetite stimulation: Patient will continue on Marinol 2.5 mg by mouth twice daily to them stimulate her appetite and improve her by mouth intake.    7) anemia: resolved-her hemoglobin today is 10.6 g/dL. We'll continue to monitor this closely.  8) dehydration: we will arrange for the patient to receive 1 liter of Normal Saline on Tuesdays and Fridays. She is also again strongly advised to increase her by mouth fluid intake  All questions were answered. The patient knows to call the clinic with any problems, questions or concerns. We can certainly see the patient much sooner if necessary.   Disclaimer: This note was dictated with voice recognition software. Similar sounding words can inadvertently be transcribed and may not be corrected upon review. Carlton Adam, PA-C 03/11/2014  ADDENDUM:  Hematology/Oncology Attending:  I had a face to face encounter with the patient today. I recommended her care plan. This is a very pleasant 63 years old white female with metastatic non-small cell lung cancer, adenocarcinoma status post induction chemotherapy was carboplatin,Alimta and Avastin discontinued secondary to disease  progression. The patient is currently undergoing immunotherapy with Nivolumab status post 4 cycles. The recent CT scan of the chest, abdomen and pelvis showed stable disease in the majority of lesion with few lesions that showed decrease in the size and only 1 suspicious small lesion in the spleen that is nonspecific. I discussed the scan results with the patient and her significant other today. I recommended for her to continue her current treatment with Nivolumab with the same dose. She will start cycle #5 today. The patient continues to have lack of appetite and poor by mouth intake. She always request IV fluid at regular basis. I will arrange for the patient to receive 1 L of normal saline on Tuesday and Friday of every week. She would come back for follow-up visit in 2 weeks for reevaluation with the start of cycle #6 of her treatment. She was advised to call immediately if she has any concerning symptoms in the interval.  Disclaimer: This note was dictated with voice recognition software. Similar sounding words can inadvertently be transcribed and may be missed upon review. Eilleen Kempf., MD 03/11/2014

## 2014-03-11 NOTE — Patient Instructions (Signed)

## 2014-03-15 ENCOUNTER — Ambulatory Visit (HOSPITAL_BASED_OUTPATIENT_CLINIC_OR_DEPARTMENT_OTHER): Payer: BC Managed Care – PPO

## 2014-03-15 ENCOUNTER — Encounter: Payer: Self-pay | Admitting: Radiation Oncology

## 2014-03-15 DIAGNOSIS — C3490 Malignant neoplasm of unspecified part of unspecified bronchus or lung: Secondary | ICD-10-CM

## 2014-03-15 DIAGNOSIS — E860 Dehydration: Secondary | ICD-10-CM

## 2014-03-15 MED ORDER — SODIUM CHLORIDE 0.9 % IV SOLN
INTRAVENOUS | Status: DC
  Administered 2014-03-15: 15:00:00 via INTRAVENOUS

## 2014-03-15 MED ORDER — HEPARIN SOD (PORK) LOCK FLUSH 100 UNIT/ML IV SOLN
500.0000 [IU] | Freq: Once | INTRAVENOUS | Status: AC
  Administered 2014-03-15: 500 [IU] via INTRAVENOUS
  Filled 2014-03-15: qty 5

## 2014-03-15 MED ORDER — SODIUM CHLORIDE 0.9 % IJ SOLN
10.0000 mL | INTRAMUSCULAR | Status: DC | PRN
  Administered 2014-03-15: 10 mL via INTRAVENOUS
  Filled 2014-03-15: qty 10

## 2014-03-15 NOTE — Progress Notes (Signed)
  Radiation Oncology         [phone] ________________________________  Name: Sherri Stafford MRN: [id number]  Date: 03/15/2014  DOB: 25-Jul-1950  End of Treatment Note  Diagnosis:  stage IV (T2a, N3, M1b) non-small cell lung cancer      Indication for treatment:  Cough and shortness of breath , compression of the left main stem bronchus by tumor     Radiation treatment dates:   October 29 through November 22  Site/dose:   Left central chest, 35 gray in 14 fractions  Beams/energy:   3-D conformal using 3 separate beams  Narrative: The patient tolerated radiation treatment relatively well.   Her cough and shortness of breath improved with her treatment.  Plan: The patient has completed radiation treatment. The patient will return to radiation oncology clinic for routine followup in one month. I advised them to call or return sooner if they have any questions or concerns related to their recovery or treatment.  -----------------------------------  Blair Promise, PhD, MD

## 2014-03-15 NOTE — Patient Instructions (Signed)

## 2014-03-18 ENCOUNTER — Ambulatory Visit (HOSPITAL_BASED_OUTPATIENT_CLINIC_OR_DEPARTMENT_OTHER): Payer: BC Managed Care – PPO

## 2014-03-18 ENCOUNTER — Other Ambulatory Visit: Payer: Self-pay | Admitting: *Deleted

## 2014-03-18 DIAGNOSIS — E860 Dehydration: Secondary | ICD-10-CM

## 2014-03-18 DIAGNOSIS — C3490 Malignant neoplasm of unspecified part of unspecified bronchus or lung: Secondary | ICD-10-CM

## 2014-03-18 MED ORDER — SODIUM CHLORIDE 0.9 % IV SOLN
1000.0000 mL | INTRAVENOUS | Status: DC
  Administered 2014-03-18: 14:00:00 via INTRAVENOUS

## 2014-03-18 NOTE — Patient Instructions (Signed)
Dehydration, Adult Dehydration is when you lose more fluids from the body than you take in. Vital organs like the kidneys, brain, and heart cannot function without a proper amount of fluids and salt. Any loss of fluids from the body can cause dehydration.  CAUSES   Vomiting.  Diarrhea.  Excessive sweating.  Excessive urine output.  Fever. SYMPTOMS  Mild dehydration  Thirst.  Dry lips.  Slightly dry mouth. Moderate dehydration  Very dry mouth.  Sunken eyes.  Skin does not bounce back quickly when lightly pinched and released.  Dark urine and decreased urine production.  Decreased tear production.  Headache. Severe dehydration  Very dry mouth.  Extreme thirst.  Rapid, weak pulse (more than 100 beats per minute at rest).  Cold hands and feet.  Not able to sweat in spite of heat and temperature.  Rapid breathing.  Blue lips.  Confusion and lethargy.  Difficulty being awakened.  Minimal urine production.  No tears. DIAGNOSIS  Your caregiver will diagnose dehydration based on your symptoms and your exam. Blood and urine tests will help confirm the diagnosis. The diagnostic evaluation should also identify the cause of dehydration. TREATMENT  Treatment of mild or moderate dehydration can often be done at home by increasing the amount of fluids that you drink. It is best to drink small amounts of fluid more often. Drinking too much at one time can make vomiting worse. Refer to the home care instructions below. Severe dehydration needs to be treated at the hospital where you will probably be given intravenous (IV) fluids that contain water and electrolytes. HOME CARE INSTRUCTIONS   Ask your caregiver about specific rehydration instructions.  Drink enough fluids to keep your urine clear or pale yellow.  Drink small amounts frequently if you have nausea and vomiting.  Eat as you normally do.  Avoid:  Foods or drinks high in sugar.  Carbonated  drinks.  Juice.  Extremely hot or cold fluids.  Drinks with caffeine.  Fatty, greasy foods.  Alcohol.  Tobacco.  Overeating.  Gelatin desserts.  Wash your hands well to avoid spreading bacteria and viruses.  Only take over-the-counter or prescription medicines for pain, discomfort, or fever as directed by your caregiver.  Ask your caregiver if you should continue all prescribed and over-the-counter medicines.  Keep all follow-up appointments with your caregiver. SEEK MEDICAL CARE IF:  You have abdominal pain and it increases or stays in one area (localizes).  You have a rash, stiff neck, or severe headache.  You are irritable, sleepy, or difficult to awaken.  You are weak, dizzy, or extremely thirsty. SEEK IMMEDIATE MEDICAL CARE IF:   You are unable to keep fluids down or you get worse despite treatment.  You have frequent episodes of vomiting or diarrhea.  You have blood or green matter (bile) in your vomit.  You have blood in your stool or your stool looks black and tarry.  You have not urinated in 6 to 8 hours, or you have only urinated a small amount of very dark urine.  You have a fever.  You faint. MAKE SURE YOU:   Understand these instructions.  Will watch your condition.  Will get help right away if you are not doing well or get worse. Document Released: 04/01/2005 Document Revised: 06/24/2011 Document Reviewed: 11/19/2010 ExitCare Patient Information 2015 [employer]. This information is not intended to replace advice given to you by your health care provider. Make sure you discuss any questions you have with your health care   provider.  

## 2014-03-22 ENCOUNTER — Other Ambulatory Visit: Payer: Self-pay | Admitting: Internal Medicine

## 2014-03-22 ENCOUNTER — Ambulatory Visit (HOSPITAL_BASED_OUTPATIENT_CLINIC_OR_DEPARTMENT_OTHER): Payer: BC Managed Care – PPO

## 2014-03-22 DIAGNOSIS — C3490 Malignant neoplasm of unspecified part of unspecified bronchus or lung: Secondary | ICD-10-CM

## 2014-03-22 DIAGNOSIS — E860 Dehydration: Secondary | ICD-10-CM

## 2014-03-22 MED ORDER — METHYLPREDNISOLONE SODIUM SUCC 125 MG IJ SOLR
125.0000 mg | Freq: Once | INTRAMUSCULAR | Status: DC | PRN
Start: 2014-03-22 — End: 2014-03-22

## 2014-03-22 MED ORDER — SODIUM CHLORIDE 0.9 % IV SOLN
INTRAVENOUS | Status: DC
  Administered 2014-03-22: 12:00:00 via INTRAVENOUS

## 2014-03-22 MED ORDER — SODIUM CHLORIDE 0.9 % IJ SOLN
10.0000 mL | INTRAMUSCULAR | Status: DC | PRN
  Administered 2014-03-22: 10 mL via INTRAVENOUS
  Filled 2014-03-22: qty 10

## 2014-03-22 MED ORDER — HEPARIN SOD (PORK) LOCK FLUSH 100 UNIT/ML IV SOLN
500.0000 [IU] | Freq: Once | INTRAVENOUS | Status: AC
  Administered 2014-03-22: 500 [IU] via INTRAVENOUS
  Filled 2014-03-22: qty 5

## 2014-03-22 NOTE — Patient Instructions (Signed)
Dehydration, Adult Dehydration is when you lose more fluids from the body than you take in. Vital organs like the kidneys, brain, and heart cannot function without a proper amount of fluids and salt. Any loss of fluids from the body can cause dehydration.  CAUSES   Vomiting.  Diarrhea.  Excessive sweating.  Excessive urine output.  Fever. SYMPTOMS  Mild dehydration  Thirst.  Dry lips.  Slightly dry mouth. Moderate dehydration  Very dry mouth.  Sunken eyes.  Skin does not bounce back quickly when lightly pinched and released.  Dark urine and decreased urine production.  Decreased tear production.  Headache. Severe dehydration  Very dry mouth.  Extreme thirst.  Rapid, weak pulse (more than 100 beats per minute at rest).  Cold hands and feet.  Not able to sweat in spite of heat and temperature.  Rapid breathing.  Blue lips.  Confusion and lethargy.  Difficulty being awakened.  Minimal urine production.  No tears. DIAGNOSIS  Your caregiver will diagnose dehydration based on your symptoms and your exam. Blood and urine tests will help confirm the diagnosis. The diagnostic evaluation should also identify the cause of dehydration. TREATMENT  Treatment of mild or moderate dehydration can often be done at home by increasing the amount of fluids that you drink. It is best to drink small amounts of fluid more often. Drinking too much at one time can make vomiting worse. Refer to the home care instructions below. Severe dehydration needs to be treated at the hospital where you will probably be given intravenous (IV) fluids that contain water and electrolytes. HOME CARE INSTRUCTIONS   Ask your caregiver about specific rehydration instructions.  Drink enough fluids to keep your urine clear or pale yellow.  Drink small amounts frequently if you have nausea and vomiting.  Eat as you normally do.  Avoid:  Foods or drinks high in sugar.  Carbonated  drinks.  Juice.  Extremely hot or cold fluids.  Drinks with caffeine.  Fatty, greasy foods.  Alcohol.  Tobacco.  Overeating.  Gelatin desserts.  Wash your hands well to avoid spreading bacteria and viruses.  Only take over-the-counter or prescription medicines for pain, discomfort, or fever as directed by your caregiver.  Ask your caregiver if you should continue all prescribed and over-the-counter medicines.  Keep all follow-up appointments with your caregiver. SEEK MEDICAL CARE IF:  You have abdominal pain and it increases or stays in one area (localizes).  You have a rash, stiff neck, or severe headache.  You are irritable, sleepy, or difficult to awaken.  You are weak, dizzy, or extremely thirsty. SEEK IMMEDIATE MEDICAL CARE IF:   You are unable to keep fluids down or you get worse despite treatment.  You have frequent episodes of vomiting or diarrhea.  You have blood or green matter (bile) in your vomit.  You have blood in your stool or your stool looks black and tarry.  You have not urinated in 6 to 8 hours, or you have only urinated a small amount of very dark urine.  You have a fever.  You faint. MAKE SURE YOU:   Understand these instructions.  Will watch your condition.  Will get help right away if you are not doing well or get worse. Document Released: 04/01/2005 Document Revised: 06/24/2011 Document Reviewed: 11/19/2010 ExitCare Patient Information 2015 [employer]. This information is not intended to replace advice given to you by your health care provider. Make sure you discuss any questions you have with your health care   provider.  

## 2014-03-24 ENCOUNTER — Other Ambulatory Visit: Payer: Self-pay | Admitting: *Deleted

## 2014-03-25 ENCOUNTER — Other Ambulatory Visit (HOSPITAL_BASED_OUTPATIENT_CLINIC_OR_DEPARTMENT_OTHER): Payer: BC Managed Care – PPO

## 2014-03-25 ENCOUNTER — Ambulatory Visit (HOSPITAL_BASED_OUTPATIENT_CLINIC_OR_DEPARTMENT_OTHER): Payer: BC Managed Care – PPO

## 2014-03-25 ENCOUNTER — Telehealth: Payer: Self-pay | Admitting: *Deleted

## 2014-03-25 ENCOUNTER — Telehealth: Payer: Self-pay | Admitting: Physician Assistant

## 2014-03-25 ENCOUNTER — Ambulatory Visit (HOSPITAL_BASED_OUTPATIENT_CLINIC_OR_DEPARTMENT_OTHER): Payer: BC Managed Care – PPO | Admitting: Physician Assistant

## 2014-03-25 ENCOUNTER — Encounter: Payer: Self-pay | Admitting: Physician Assistant

## 2014-03-25 ENCOUNTER — Ambulatory Visit: Payer: BC Managed Care – PPO

## 2014-03-25 VITALS — BP 121/85 | HR 120 | Temp 97.8°F | Resp 18 | Ht 63.0 in | Wt 116.3 lb

## 2014-03-25 DIAGNOSIS — C7951 Secondary malignant neoplasm of bone: Secondary | ICD-10-CM

## 2014-03-25 DIAGNOSIS — Z95828 Presence of other vascular implants and grafts: Secondary | ICD-10-CM

## 2014-03-25 DIAGNOSIS — E876 Hypokalemia: Secondary | ICD-10-CM

## 2014-03-25 DIAGNOSIS — C3490 Malignant neoplasm of unspecified part of unspecified bronchus or lung: Secondary | ICD-10-CM

## 2014-03-25 DIAGNOSIS — C7931 Secondary malignant neoplasm of brain: Secondary | ICD-10-CM

## 2014-03-25 DIAGNOSIS — C3432 Malignant neoplasm of lower lobe, left bronchus or lung: Secondary | ICD-10-CM

## 2014-03-25 DIAGNOSIS — Z79899 Other long term (current) drug therapy: Secondary | ICD-10-CM

## 2014-03-25 DIAGNOSIS — Z5112 Encounter for antineoplastic immunotherapy: Secondary | ICD-10-CM

## 2014-03-25 DIAGNOSIS — C787 Secondary malignant neoplasm of liver and intrahepatic bile duct: Secondary | ICD-10-CM

## 2014-03-25 DIAGNOSIS — E860 Dehydration: Secondary | ICD-10-CM

## 2014-03-25 LAB — CBC WITH DIFFERENTIAL/PLATELET
BASO%: 0.8 % (ref 0.0–2.0)
BASOS ABS: 0 10*3/uL (ref 0.0–0.1)
EOS%: 5.1 % (ref 0.0–7.0)
Eosinophils Absolute: 0.2 10*3/uL (ref 0.0–0.5)
HCT: 29.5 % — ABNORMAL LOW (ref 34.8–46.6)
HGB: 9.3 g/dL — ABNORMAL LOW (ref 11.6–15.9)
LYMPH%: 10.9 % — ABNORMAL LOW (ref 14.0–49.7)
MCH: 26 pg (ref 25.1–34.0)
MCHC: 31.6 g/dL (ref 31.5–36.0)
MCV: 82.3 fL (ref 79.5–101.0)
MONO#: 0.3 10*3/uL (ref 0.1–0.9)
MONO%: 8.4 % (ref 0.0–14.0)
NEUT#: 2.5 10*3/uL (ref 1.5–6.5)
NEUT%: 74.8 % (ref 38.4–76.8)
Platelets: 345 10*3/uL (ref 145–400)
RBC: 3.59 10*6/uL — ABNORMAL LOW (ref 3.70–5.45)
RDW: 20.1 % — AB (ref 11.2–14.5)
WBC: 3.3 10*3/uL — ABNORMAL LOW (ref 3.9–10.3)
lymph#: 0.4 10*3/uL — ABNORMAL LOW (ref 0.9–3.3)

## 2014-03-25 LAB — COMPREHENSIVE METABOLIC PANEL (CC13)
ALT: 13 U/L (ref 0–55)
AST: 12 U/L (ref 5–34)
Albumin: 2.5 g/dL — ABNORMAL LOW (ref 3.5–5.0)
Alkaline Phosphatase: 81 U/L (ref 40–150)
Anion Gap: 11 mEq/L (ref 3–11)
BILIRUBIN TOTAL: 0.64 mg/dL (ref 0.20–1.20)
BUN: 5.7 mg/dL — ABNORMAL LOW (ref 7.0–26.0)
CO2: 25 mEq/L (ref 22–29)
Calcium: 8.8 mg/dL (ref 8.4–10.4)
Chloride: 102 mEq/L (ref 98–109)
Creatinine: 0.7 mg/dL (ref 0.6–1.1)
GLUCOSE: 143 mg/dL — AB (ref 70–140)
Potassium: 3.4 mEq/L — ABNORMAL LOW (ref 3.5–5.1)
Sodium: 138 mEq/L (ref 136–145)
Total Protein: 6.3 g/dL — ABNORMAL LOW (ref 6.4–8.3)

## 2014-03-25 LAB — TSH CHCC: TSH: 1.175 m(IU)/L (ref 0.308–3.960)

## 2014-03-25 MED ORDER — ALBUTEROL SULFATE (2.5 MG/3ML) 0.083% IN NEBU
2.5000 mg | INHALATION_SOLUTION | Freq: Once | RESPIRATORY_TRACT | Status: DC | PRN
  Filled 2014-03-25: qty 3

## 2014-03-25 MED ORDER — SODIUM CHLORIDE 0.9 % IV SOLN: INTRAVENOUS | Status: DC

## 2014-03-25 MED ORDER — HEPARIN SOD (PORK) LOCK FLUSH 100 UNIT/ML IV SOLN
500.0000 [IU] | Freq: Once | INTRAVENOUS | Status: AC | PRN
  Administered 2014-03-25: 500 [IU]
  Filled 2014-03-25: qty 5

## 2014-03-25 MED ORDER — SODIUM CHLORIDE 0.9 % IJ SOLN
10.0000 mL | INTRAMUSCULAR | Status: DC | PRN
  Administered 2014-03-25: 10 mL via INTRAVENOUS
  Filled 2014-03-25: qty 10

## 2014-03-25 MED ORDER — SODIUM CHLORIDE 0.9 % IJ SOLN
10.0000 mL | INTRAMUSCULAR | Status: DC | PRN
  Administered 2014-03-25: 10 mL
  Filled 2014-03-25: qty 10

## 2014-03-25 MED ORDER — DIPHENHYDRAMINE HCL 50 MG/ML IJ SOLN: 50.0000 mg | Freq: Once | INTRAMUSCULAR | Status: DC | PRN

## 2014-03-25 MED ORDER — SODIUM CHLORIDE 0.9 % IV SOLN
3.0000 mg/kg | Freq: Once | INTRAVENOUS | Status: AC
  Administered 2014-03-25: 160 mg via INTRAVENOUS
  Filled 2014-03-25: qty 16

## 2014-03-25 MED ORDER — DIPHENHYDRAMINE HCL 50 MG/ML IJ SOLN: 25.0000 mg | Freq: Once | INTRAMUSCULAR | Status: DC | PRN

## 2014-03-25 MED ORDER — SODIUM CHLORIDE 0.9 % IV SOLN
Freq: Once | INTRAVENOUS | Status: AC | PRN
  Administered 2014-03-25: 12:00:00 via INTRAVENOUS

## 2014-03-25 NOTE — Telephone Encounter (Signed)
Pt confirmed labs/ov per 12/11 POF, gave pt AVS..... KJ, sent msg to add chemo

## 2014-03-25 NOTE — Progress Notes (Signed)
[place] [phone]   [phone]  SHARED VISIT PROGRESS NOTE  [name], MD [place] [address]  DIAGNOSIS: stage IV (T2a, N3, M1b) non-small cell lung cancer, adenocarcinoma presented with large left lower lobe lung mass in addition to bilateral pulmonary nodules and mediastinal and supraclavicular lymphadenopathy diagnosed in May of 2015.  Genomic Alterations Identified? ERBB3 amplification CDK4 amplification IDH2 R172S KRAS G12D MDM2 amplification RBM10 [id number] TERC amplification - equivocal? Additional Disease-relevant Genes with No Reportable Alterations Identified? RET ALK BRAF ERBB2 MET EGFR  PRIOR THERAPY:  1) status post palliative radiotherapy to the left [facility]  status post stereotactic radiotherapy to brain lesions [place] the care [place]. [place] 2) Systemic chemotherapy with carboplatin for AUC of 5, Alimta 500 mg/M2 and Avastin 15 mg/KG every 3 weeks, status post 5 cycles. [facility]   CURRENT THERAPY:  1) immunotherapy with Nivolmab at 3 mg/kg given every 3 weeks. Status post 5 cycles  2) Xgeva 120 mcg subcutaneously every 2 months.  INTERVAL HISTORY: Sherri VANLIEW63y.o. female returns to the clinic today for followup visit accompanied by her significant other. The patient was seen for initial evaluation at the multidisciplinary thoracic oncology clinic on 09/01/2013 after she was diagnosed with metastatic non-small cell lung cancer. [facility] at [facility] She was seen by Dr. DOretha Capriceand was started on treatment with carboplatin, Alimta and Avastin status post 4 cycles. Repeat CT scan of the chest, abdomen and  pelvis after cycle #3 on 11/22/2013 showed multiple bilateral pulmonary nodules and masses compatible with metastatic disease and compared to the previous scan on 08/12/2013 many of these nodule had slightly decreased in size. There was also mediastinal and bilateral hilar lymphadenopathy slightly decreased compared to the prior scan. The left hilar and mediastinal lymph nodes remain similar in size to the prior study. There is a new fracture of the right ninth rib suspicious for a pathologic fracture. CT scan of the abdomen and pelvis on the same day showed interval increase in size of 2 out of the 3 hypodense lesions within the liver concerning for worsening metastatic disease. The lesion within the right hepatic lobe measured 1.7 x 1.6 CM compared to 1.2 x 1.1 CM on the previous scan. An additional right hepatic lesion measured 1.2 x 0.9 CM compared to 0.8 x 0.7 CM and a lesion within the left hepatic lobe measured 0.9 x 0.6 CM compared to 0.9 x 0.5 CM previously. There was no evidence for new hepatic lesions, unchanged lytic lesions within the L3 vertebral body and pelvis consistent with osseous metastatic disease. Dr. DOretha Capricerecommended for the patient to continue her current systemic chemotherapy with carboplatin, Alimta and Avastin. She received cycle #4 on 11/22/2013.  The [facility]. [place] of the chest, abdomen and pelvis and presents revealed evidence for disease progression.   She is currently being treated with immunotherapy with Nivolumab status post 5 cycles. She denies any change in her baseline shortness of breath and actually feels her breathing is better.She denies skin rash  or diarrhea. She is eating more food and moving around more but is still not drinking fluids adequately. She complains of her back feeling hot and itching. She  completed her palliative radiation therapy 03/04/2014. She reports improvement in her cough and her shortness of breath. She requests IV fluids again today.  Patient reports that she is to see Dr. Alvan Dame on March 30, 2014 regarding her left leg and possible impending fracture.  She is currently being treated for a pulmonary embolism diagnosed on 01/14/2014 and is currently on Lovenox injections. She presents to proceed with cycle #6 of her immunotherapy with Nivolumab.     MEDICAL HISTORY: Past Medical History  Diagnosis Date  . Arthritis   . Bilateral ovarian cysts   . GERD (gastroesophageal reflux disease)   . Strain of hip flexor 06/2013    left side torn  . Cancer     lung ca  . Bone metastases     to left hip and spine  . Radiation     at [facility] to left hip, sacrum and brain    ALLERGIES:  is allergic to other; ketoconazole; lorazepam; morphine and related; nystatin; sporanox; tylenol; vitamin d; and zofran.  MEDICATIONS:  Current Outpatient Prescriptions  Medication Sig Dispense Refill  . docusate sodium (COLACE) 100 MG capsule Take 100 mg by mouth 2 (two) times daily.      Marland Kitchen enoxaparin (LOVENOX) 100 MG/ML injection INJECT 0.9ML SUBCUTANEOUSLY EVERY DAY FOR 30 DAYS  30 Syringe  0  . esomeprazole (NEXIUM) 40 MG capsule Take 1 capsule (40 mg total) by mouth 2 (two) times daily before a meal.  60 capsule  0  . fentaNYL (DURAGESIC - DOSED MCG/HR) 50 MCG/HR Place 1 patch (50 mcg total) onto the skin every 3 (three) days.  5 patch  0  . gabapentin (NEURONTIN) 600 MG tablet Take 600 mg by mouth 3 (three) times daily.      . polyethylene glycol (MIRALAX / GLYCOLAX) packet Take 17 g by mouth daily as needed for moderate constipation.      . polyvinyl alcohol (LIQUIFILM TEARS) 1.4 % ophthalmic solution Place 1 drop into both eyes as needed for dry eyes.       Marland Kitchen prochlorperazine (COMPAZINE) 10 MG tablet Take 1 tablet (10 mg total) by mouth every 6 (six) hours as needed for nausea or vomiting.   30 tablet  0  . senna (SENOKOT) 8.6 MG tablet Take 2 tablets by mouth 2 (two) times daily.       . traMADol (ULTRAM) 50 MG tablet Every 6-8 hours (per patient)      . albuterol (PROVENTIL HFA;VENTOLIN HFA) 108 (90 BASE) MCG/ACT inhaler Inhale 1-2 puffs into the lungs every 6 (six) hours as needed for wheezing or shortness of breath.  1 Inhaler  2  . bisacodyl (DULCOLAX) 10 MG suppository Place 1 suppository (10 mg total) rectally daily as needed for moderate constipation.  10 suppository  0  . calcium carbonate (OS-CAL) 1250 MG chewable tablet Chew 4 tablets by mouth daily.      . codeine 30 MG tablet Take 1 tablet (30 mg total) by mouth every 4 (four) hours as needed (cough).  80 tablet  0  . dronabinol (MARINOL) 2.5 MG capsule Take 1 capsule (2.5 mg total) by mouth 2 (two) times daily before a meal.  60 capsule  0  . glycerin adult 2 G SUPP Place 1 suppository rectally once as needed for moderate constipation.      Marland Kitchen  levofloxacin (LEVAQUIN) 500 MG tablet Take 1 tablet (500 mg total) by mouth daily.  3 tablet  0  . loratadine (CLARITIN) 10 MG tablet Take 1 tablet (10 mg total) by mouth daily.  15 tablet  0  . methylPREDNIsolone (MEDROL DOSPACK) 4 MG tablet follow package directions  21 tablet  0  . OLANZapine (ZYPREXA) 10 MG tablet        No current facility-administered medications for this visit.   Facility-Administered Medications Ordered in Other Visits  Medication Dose Route Frequency Provider Last Rate Last Dose  . heparin lock flush 100 unit/mL  500 Units Intracatheter Once PRN Curt Bears, MD      . nivolumab (OPDIVO) 180 mg in sodium chloride 0.9 % 100 mL chemo infusion  3 mg/kg (Treatment Plan Actual) Intravenous Once Curt Bears, MD 118 mL/hr at 02/10/14 1418 180 mg at 02/10/14 1418  . sodium chloride 0.9 % injection 10 mL  10 mL Intracatheter PRN Curt Bears, MD        SURGICAL HISTORY:  Past Surgical History  Procedure Laterality Date  . Dilation and curettage of  uterus  2004    x 3   . Tubal ligation    . Colonoscopy  04/05/2004    normal     REVIEW OF SYSTEMS:  Constitutional: positive for anorexia, fatigue and weight loss Eyes: negative Ears, nose, mouth, throat, and face: negative Respiratory: positive for cough and dyspnea on exertion Cardiovascular: negative Gastrointestinal: positive for constipation and nausea Genitourinary:negative Integument/breast: itching of her back Hematologic/lymphatic: negative Musculoskeletal:positive for muscle weakness Neurological: negative Behavioral/Psych: negative Endocrine: negative Allergic/Immunologic: negative   PHYSICAL EXAMINATION: General appearance: alert, cooperative, fatigued and no distress Head: Normocephalic, without obvious abnormality, atraumatic Neck: no adenopathy, no JVD, supple, symmetrical, trachea midline and thyroid not enlarged, symmetric, no tenderness/mass/nodules Lymph nodes: Cervical, supraclavicular, and axillary nodes normal. Resp: clear to auscultation bilaterally Back: symmetric, no curvature. ROM normal. No CVA tenderness. Cardio: regular rate and rhythm, S1, S2 normal, no murmur, click, rub or gallop GI: soft, non-tender; bowel sounds normal; no masses,  no organomegaly Extremities: extremities normal, atraumatic, no cyanosis or edema Neurologic: Alert and oriented X 3, normal strength and tone. Normal symmetric reflexes. Normal coordination and gait Skin: generally dry, skin on the back without distinct rash  ECOG PERFORMANCE STATUS: 2 - Symptomatic, <50% confined to bed  Blood pressure 109/65, pulse 60, temperature 98.4 F (36.9 C), temperature source Oral, resp. rate 18, height _0  (1.6 m), weight 131 lb 8 oz (59.648 kg), last menstrual period 01/14/2008, SpO2 98.00%.  LABORATORY DATA: Lab Results  Component Value Date   WBC 7.2 02/10/2014   HGB 7.4* 02/10/2014   HCT 23.2* 02/10/2014   MCV 85.9 02/10/2014   PLT 274 02/10/2014      Chemistry        Component Value Date/Time   NA 132* 02/10/2014 1140   NA 141 01/16/2014 0530   K 3.8 02/10/2014 1140   K 3.5* 01/16/2014 0530   CL 106 01/16/2014 0530   CO2 26 02/10/2014 1140   CO2 24 01/16/2014 0530   BUN 8.2 02/10/2014 1140   BUN 7 01/16/2014 0530   CREATININE 0.7 02/10/2014 1140   CREATININE 0.63 01/16/2014 0530   CREATININE 0.83 09/07/2013 1506      Component Value Date/Time   CALCIUM 8.4 02/10/2014 1140   CALCIUM 8.0* 01/16/2014 0530   ALKPHOS 62 02/10/2014 1140   ALKPHOS 66 11/14/2013 1057   AST 20 02/10/2014 1140  AST 12 11/14/2013 1057   ALT 18 02/10/2014 1140   ALT 15 11/14/2013 1057   BILITOT 1.12 02/10/2014 1140   BILITOT 1.4* 11/14/2013 1057       RADIOGRAPHIC STUDIES: Ct Chest W Contrast  03/07/2014   CLINICAL DATA:  63 year old female with history of left-sided lung cancer and bone metastasis diagnosed in 2015. Chemotherapy in progress.  EXAM: CT CHEST, ABDOMEN, AND PELVIS WITH CONTRAST  TECHNIQUE: Multidetector CT imaging of the chest, abdomen and pelvis was performed following the standard protocol during bolus administration of intravenous contrast.  CONTRAST:  130m OMNIPAQUE IOHEXOL 300 MG/ML  SOLN  COMPARISON:  CT of abdomen and pelvis 12/31/2013. PET-CT 09/09/2013.  FINDINGS: CT CHEST FINDINGS  Mediastinum: Compared to prior examinations, the extensive mediastinal and hilar lymphadenopathy noted on the prior study has decreased. Specific examples include a 2 cm short axis low right paratracheal lymph node (previously 2.4 cm), and a 3.5 x 2.0 cm subcarinal nodal mass (previously 3.9 x 2.4 cm). Bulky left hilar and infrahilar lymphadenopathy has also significantly decreased, best appreciated on image 30 of series 2, but is irregular in shape and therefore difficult to measure. Heart size is normal. Small volume of pericardial fluid and new pericardial enhancement. This is unlikely to be of hemodynamic significance at this time. Right-sided double-lumen internal jugular porta  cath with tip terminating in the right atrium. Esophagus is unremarkable in appearance.  Lungs/Pleura: Innumerable pulmonary nodules and masses are again noted, generally decreased in size compared to the prior examination. A notable exception of this is an enlarging 1.5 x 1.4 cm nodule in the periphery of the right lower lobe (image 37 of series 4). Additionally, the largest of the masses in the left lower lobe appears essentially unchanged compared to the prior examination, currently measuring 3.9 x 4.0 cm (image 38 of series 4), previously 4.3 x 3.6 cm). Nodules that have decreased in size compared to the prior examination include a 1.2 x 1.8 cm left upper lobe nodule (image 22 of series 4), which previously measured 1.9 x 2.2 cm, and a 1.5 x 0.9 cm nodule in the medial aspect of the anterior right upper lobe (image 22 of series 4), which previously measured 1.6 x 1.5 cm. Some nodules appear partially cavitary, including a 1.7 x 1.6 cm nodule in the periphery of the left upper lobe (image 20 of series 4). Small left pleural effusion appears slightly increased compared to the prior study, presumably malignant.  Musculoskeletal: There are no aggressive appearing lytic or blastic lesions noted in the visualized portions of the skeleton.  CT ABDOMEN AND PELVIS FINDINGS  Hepatobiliary: Previously noted metastatic lesions in the right lobe of the liver appear slightly less well-defined than the prior study, with the largest of these measuring 16 x 11 mm in segment 7 on today's examination (image 50 of series 2), slightly decreased compared to the prior study. No new hepatic lesions are noted. No intra or extrahepatic biliary ductal dilatation. Gallbladder is normal in appearance.  Pancreas: Unremarkable.  Spleen: New 1.0 x 1.3 cm low-attenuation lesion in the medial aspect of the inferior spleen (image 59 of series 2), concerning for a new splenic metastasis.  Adrenals/Urinary Tract: Bilateral adrenal glands are normal  in appearance. Bilateral kidneys are normal in appearance. No hydroureteronephrosis. Urinary bladder is normal in appearance.  Stomach/Bowel: Normal appearance of the stomach. No pathologic dilatation of small bowel or colon.  Vascular/Lymphatic: Mild atherosclerosis throughout the abdominal and pelvic vasculature, without evidence of aneurysm  or dissection.  Reproductive: Uterus and ovaries are unremarkable in appearance.  Other: No significant volume of ascites.  No pneumoperitoneum.  Musculoskeletal: Again noted are multiple mixed lytic and sclerotic lesions in the visualized axial and appendicular skeleton. The largest of these is centered in the sacrum at the level of S1, currently measuring 3.7 x 2.8 cm.  IMPRESSION: 1. Today's study demonstrates a mixed response to therapy. Overall, in the chest, the majority of the pulmonary nodules appear smaller than the prior examination, with some exceptions which are stable to slightly increased in size, as detailed above. Mediastinal and left hilar adenopathy has significantly decreased. Metastatic lesions in the liver are slightly less prominent, and numerous osseous lesions are generally similar to the prior study, with some interval changes in size and appearance, which are presumably treatment related. No definite new osseous lesions noted. There is 1 new lesion in the spleen measuring 1 x 1.3 cm, which is nonspecific, but concerning for a new metastatic lesion. 2. Additional incidental findings, as above.   Electronically Signed   By: Vinnie Langton M.D.   On: 03/07/2014 11:39   Ct Abdomen Pelvis W Contrast  03/07/2014   CLINICAL DATA:  63 year old female with history of left-sided lung cancer and bone metastasis diagnosed in 2015. Chemotherapy in progress.  EXAM: CT CHEST, ABDOMEN, AND PELVIS WITH CONTRAST  TECHNIQUE: Multidetector CT imaging of the chest, abdomen and pelvis was performed following the standard protocol during bolus administration of  intravenous contrast.  CONTRAST:  177m OMNIPAQUE IOHEXOL 300 MG/ML  SOLN  COMPARISON:  CT of abdomen and pelvis 12/31/2013. PET-CT 09/09/2013.  FINDINGS: CT CHEST FINDINGS  Mediastinum: Compared to prior examinations, the extensive mediastinal and hilar lymphadenopathy noted on the prior study has decreased. Specific examples include a 2 cm short axis low right paratracheal lymph node (previously 2.4 cm), and a 3.5 x 2.0 cm subcarinal nodal mass (previously 3.9 x 2.4 cm). Bulky left hilar and infrahilar lymphadenopathy has also significantly decreased, best appreciated on image 30 of series 2, but is irregular in shape and therefore difficult to measure. Heart size is normal. Small volume of pericardial fluid and new pericardial enhancement. This is unlikely to be of hemodynamic significance at this time. Right-sided double-lumen internal jugular porta cath with tip terminating in the right atrium. Esophagus is unremarkable in appearance.  Lungs/Pleura: Innumerable pulmonary nodules and masses are again noted, generally decreased in size compared to the prior examination. A notable exception of this is an enlarging 1.5 x 1.4 cm nodule in the periphery of the right lower lobe (image 37 of series 4). Additionally, the largest of the masses in the left lower lobe appears essentially unchanged compared to the prior examination, currently measuring 3.9 x 4.0 cm (image 38 of series 4), previously 4.3 x 3.6 cm). Nodules that have decreased in size compared to the prior examination include a 1.2 x 1.8 cm left upper lobe nodule (image 22 of series 4), which previously measured 1.9 x 2.2 cm, and a 1.5 x 0.9 cm nodule in the medial aspect of the anterior right upper lobe (image 22 of series 4), which previously measured 1.6 x 1.5 cm. Some nodules appear partially cavitary, including a 1.7 x 1.6 cm nodule in the periphery of the left upper lobe (image 20 of series 4). Small left pleural effusion appears slightly increased  compared to the prior study, presumably malignant.  Musculoskeletal: There are no aggressive appearing lytic or blastic lesions noted in the visualized portions of  the skeleton.  CT ABDOMEN AND PELVIS FINDINGS  Hepatobiliary: Previously noted metastatic lesions in the right lobe of the liver appear slightly less well-defined than the prior study, with the largest of these measuring 16 x 11 mm in segment 7 on today's examination (image 50 of series 2), slightly decreased compared to the prior study. No new hepatic lesions are noted. No intra or extrahepatic biliary ductal dilatation. Gallbladder is normal in appearance.  Pancreas: Unremarkable.  Spleen: New 1.0 x 1.3 cm low-attenuation lesion in the medial aspect of the inferior spleen (image 59 of series 2), concerning for a new splenic metastasis.  Adrenals/Urinary Tract: Bilateral adrenal glands are normal in appearance. Bilateral kidneys are normal in appearance. No hydroureteronephrosis. Urinary bladder is normal in appearance.  Stomach/Bowel: Normal appearance of the stomach. No pathologic dilatation of small bowel or colon.  Vascular/Lymphatic: Mild atherosclerosis throughout the abdominal and pelvic vasculature, without evidence of aneurysm or dissection.  Reproductive: Uterus and ovaries are unremarkable in appearance.  Other: No significant volume of ascites.  No pneumoperitoneum.  Musculoskeletal: Again noted are multiple mixed lytic and sclerotic lesions in the visualized axial and appendicular skeleton. The largest of these is centered in the sacrum at the level of S1, currently measuring 3.7 x 2.8 cm.  IMPRESSION: 1. Today's study demonstrates a mixed response to therapy. Overall, in the chest, the majority of the pulmonary nodules appear smaller than the prior examination, with some exceptions which are stable to slightly increased in size, as detailed above. Mediastinal and left hilar adenopathy has significantly decreased. Metastatic lesions in the  liver are slightly less prominent, and numerous osseous lesions are generally similar to the prior study, with some interval changes in size and appearance, which are presumably treatment related. No definite new osseous lesions noted. There is 1 new lesion in the spleen measuring 1 x 1.3 cm, which is nonspecific, but concerning for a new metastatic lesion. 2. Additional incidental findings, as above.   Electronically Signed   By: Vinnie Langton M.D.   On: 03/07/2014 11:39    ASSESSMENT AND PLAN: This is a very pleasant 64 years old white female with:   1) stage IV non-small cell lung cancer, adenocarcinoma with metastatic disease to the bone, brain and liver. She is currently undergoing systemic chemotherapy with carboplatin, Alimta and Avastin is status post 5 cycles. She had some evidence for mild disease progression after cycle #3 especially in the liver lesions but the patient was asked to continue her treatment with the same regimen by Dr. Oretha Caprice. The restaging CT scan revealed evidence for disease progression within the lungs and liver as well as extensive bone metastasis. She is now being treated with immunotherapy with Nivolumab 42m/kg given every 2 weeks. She is status post 4 cycles.  Her recent restaging CT scan of the chest, abdomen and pelvis with contrast revealed a mixed response to therapy with overall stable to decrease in size of nodules within the chest and liver. There is a new area in the spleen that is nonspecific and we will follow this on future CT scans. These results were reviewed with the patient and her significant other.She'll proceed with cycle #6 today as scheduled. She'll return in 2 weeks prior to cycle #7.  Her previous molecular studies showed positive ERBB3 amplification. One of the options to treat this abnormality this treatment with single agent oral Afatinib.  2) metastatic bone disease: The patient will continue treatment with Xgeva subcutaneously every 2 months.  She was  referred  to Dr. Alvan Dame with [facility] orthopedic for evaluation and close monitoring of the left hip lesion since the patient has transferred her care to [facility].  3) pain management: She will continue on tramadol 50 mg by mouth every 6 hours as needed for pain.  4) constipation and hemorrhoids: She will continue her current treatment with Dulcolax suppository in addition to MiraLAX on an as-needed basis.  5) nausea: Patient will be continued on Zyprexa 10 mg by mouth at bedtime. She's had improvement in her intake of food but still has inadequate by mouth intake of fluids.   6) appetite stimulation: Patient will continue on Marinol 2.5 mg by mouth twice daily to them stimulate her appetite and improve her by mouth intake.    7) anemia: resolved/stable. We'll continue to monitor this closely.  8) dehydration: we will arrange for the patient to receive 1 liter of Normal Saline on Tuesdays and Fridays. She is also again strongly advised to increase her by mouth fluid intake  9) hypokalemia: mild at 3.4, patient is encouraged to increase her by mouth intake of potassium rich foods.  All questions were answered. The patient knows to call the clinic with any problems, questions or concerns. We can certainly see the patient much sooner if necessary.   Disclaimer: This note was dictated with voice recognition software. Similar sounding words can inadvertently be transcribed and may not be corrected upon review. Carlton Adam, PA-C 03/25/2014

## 2014-03-25 NOTE — Patient Instructions (Signed)
Increase your by mouth intake of food and fluids. Be sure to include potassium rich foods Follow up in 2 weeks, prior to your next scheduled cycle of immunotherapy

## 2014-03-25 NOTE — Patient Instructions (Signed)
[facility] Discharge Instructions for Patients Receiving Chemotherapy  Today you received the following chemotherapy agents nivolumab   To help prevent nausea and vomiting after your treatment, we encourage you to take your nausea medication as directed   If you develop nausea and vomiting that is not controlled by your nausea medication, call the clinic.   BELOW ARE SYMPTOMS THAT SHOULD BE REPORTED IMMEDIATELY:  *FEVER GREATER THAN 100.5 F  *CHILLS WITH OR WITHOUT FEVER  NAUSEA AND VOMITING THAT IS NOT CONTROLLED WITH YOUR NAUSEA MEDICATION  *UNUSUAL SHORTNESS OF BREATH  *UNUSUAL BRUISING OR BLEEDING  TENDERNESS IN MOUTH AND THROAT WITH OR WITHOUT PRESENCE OF ULCERS  *URINARY PROBLEMS  *BOWEL PROBLEMS  UNUSUAL RASH Items with * indicate a potential emergency and should be followed up as soon as possible.  Feel free to call the clinic you have any questions or concerns. The clinic phone number is [phone].  

## 2014-03-25 NOTE — Patient Instructions (Signed)

## 2014-03-25 NOTE — Telephone Encounter (Signed)
Per staff message and POF I have scheduled appts. Advised scheduler of appts. JMW  

## 2014-03-25 NOTE — Telephone Encounter (Signed)
Chemo was added per MW, pt was upset that all IVF's were not added but per treatment area this was not listed on the order or POF, pt's husband was upset wanted this fixed before he left that it didn't take 3 hours to add treatment dates. All dates were added and pt left satisfied.

## 2014-03-29 ENCOUNTER — Ambulatory Visit (HOSPITAL_BASED_OUTPATIENT_CLINIC_OR_DEPARTMENT_OTHER): Payer: BC Managed Care – PPO

## 2014-03-29 DIAGNOSIS — E860 Dehydration: Secondary | ICD-10-CM

## 2014-03-29 DIAGNOSIS — C3490 Malignant neoplasm of unspecified part of unspecified bronchus or lung: Secondary | ICD-10-CM

## 2014-03-29 MED ORDER — SODIUM CHLORIDE 0.9 % IV SOLN
INTRAVENOUS | Status: DC
  Administered 2014-03-29: 15:00:00 via INTRAVENOUS

## 2014-03-29 MED ORDER — HEPARIN SOD (PORK) LOCK FLUSH 100 UNIT/ML IV SOLN
500.0000 [IU] | Freq: Once | INTRAVENOUS | Status: AC
  Administered 2014-03-29: 500 [IU] via INTRAVENOUS
  Filled 2014-03-29: qty 5

## 2014-03-29 MED ORDER — SODIUM CHLORIDE 0.9 % IJ SOLN
10.0000 mL | INTRAMUSCULAR | Status: DC | PRN
  Administered 2014-03-29: 10 mL via INTRAVENOUS
  Filled 2014-03-29: qty 10

## 2014-03-29 NOTE — Patient Instructions (Signed)
Dehydration, Adult Dehydration is when you lose more fluids from the body than you take in. Vital organs like the kidneys, brain, and heart cannot function without a proper amount of fluids and salt. Any loss of fluids from the body can cause dehydration.  CAUSES   Vomiting.  Diarrhea.  Excessive sweating.  Excessive urine output.  Fever. SYMPTOMS  Mild dehydration  Thirst.  Dry lips.  Slightly dry mouth. Moderate dehydration  Very dry mouth.  Sunken eyes.  Skin does not bounce back quickly when lightly pinched and released.  Dark urine and decreased urine production.  Decreased tear production.  Headache. Severe dehydration  Very dry mouth.  Extreme thirst.  Rapid, weak pulse (more than 100 beats per minute at rest).  Cold hands and feet.  Not able to sweat in spite of heat and temperature.  Rapid breathing.  Blue lips.  Confusion and lethargy.  Difficulty being awakened.  Minimal urine production.  No tears. DIAGNOSIS  Your caregiver will diagnose dehydration based on your symptoms and your exam. Blood and urine tests will help confirm the diagnosis. The diagnostic evaluation should also identify the cause of dehydration. TREATMENT  Treatment of mild or moderate dehydration can often be done at home by increasing the amount of fluids that you drink. It is best to drink small amounts of fluid more often. Drinking too much at one time can make vomiting worse. Refer to the home care instructions below. Severe dehydration needs to be treated at the hospital where you will probably be given intravenous (IV) fluids that contain water and electrolytes. HOME CARE INSTRUCTIONS   Ask your caregiver about specific rehydration instructions.  Drink enough fluids to keep your urine clear or pale yellow.  Drink small amounts frequently if you have nausea and vomiting.  Eat as you normally do.  Avoid:  Foods or drinks high in sugar.  Carbonated  drinks.  Juice.  Extremely hot or cold fluids.  Drinks with caffeine.  Fatty, greasy foods.  Alcohol.  Tobacco.  Overeating.  Gelatin desserts.  Wash your hands well to avoid spreading bacteria and viruses.  Only take over-the-counter or prescription medicines for pain, discomfort, or fever as directed by your caregiver.  Ask your caregiver if you should continue all prescribed and over-the-counter medicines.  Keep all follow-up appointments with your caregiver. SEEK MEDICAL CARE IF:  You have abdominal pain and it increases or stays in one area (localizes).  You have a rash, stiff neck, or severe headache.  You are irritable, sleepy, or difficult to awaken.  You are weak, dizzy, or extremely thirsty. SEEK IMMEDIATE MEDICAL CARE IF:   You are unable to keep fluids down or you get worse despite treatment.  You have frequent episodes of vomiting or diarrhea.  You have blood or green matter (bile) in your vomit.  You have blood in your stool or your stool looks black and tarry.  You have not urinated in 6 to 8 hours, or you have only urinated a small amount of very dark urine.  You have a fever.  You faint. MAKE SURE YOU:   Understand these instructions.  Will watch your condition.  Will get help right away if you are not doing well or get worse. Document Released: 04/01/2005 Document Revised: 06/24/2011 Document Reviewed: 11/19/2010 ExitCare Patient Information 2015 [employer]. This information is not intended to replace advice given to you by your health care provider. Make sure you discuss any questions you have with your health care   provider.  

## 2014-04-01 ENCOUNTER — Ambulatory Visit (HOSPITAL_BASED_OUTPATIENT_CLINIC_OR_DEPARTMENT_OTHER): Payer: BC Managed Care – PPO | Admitting: Nurse Practitioner

## 2014-04-01 ENCOUNTER — Ambulatory Visit (HOSPITAL_BASED_OUTPATIENT_CLINIC_OR_DEPARTMENT_OTHER): Payer: BC Managed Care – PPO

## 2014-04-01 DIAGNOSIS — E860 Dehydration: Secondary | ICD-10-CM

## 2014-04-01 DIAGNOSIS — C3490 Malignant neoplasm of unspecified part of unspecified bronchus or lung: Secondary | ICD-10-CM

## 2014-04-01 DIAGNOSIS — L299 Pruritus, unspecified: Secondary | ICD-10-CM

## 2014-04-01 DIAGNOSIS — R1084 Generalized abdominal pain: Secondary | ICD-10-CM

## 2014-04-01 DIAGNOSIS — C7951 Secondary malignant neoplasm of bone: Secondary | ICD-10-CM

## 2014-04-01 MED ORDER — SODIUM CHLORIDE 0.9 % IJ SOLN
10.0000 mL | INTRAMUSCULAR | Status: DC | PRN
  Administered 2014-04-01: 10 mL via INTRAVENOUS
  Filled 2014-04-01: qty 10

## 2014-04-01 MED ORDER — HEPARIN SOD (PORK) LOCK FLUSH 100 UNIT/ML IV SOLN
500.0000 [IU] | Freq: Once | INTRAVENOUS | Status: AC
  Administered 2014-04-01: 500 [IU] via INTRAVENOUS
  Filled 2014-04-01: qty 5

## 2014-04-01 MED ORDER — SODIUM CHLORIDE 0.9 % IV SOLN
1000.0000 mL | Freq: Once | INTRAVENOUS | Status: AC
  Administered 2014-04-01: 1000 mL via INTRAVENOUS

## 2014-04-01 NOTE — Patient Instructions (Signed)

## 2014-04-04 ENCOUNTER — Telehealth: Payer: Self-pay | Admitting: *Deleted

## 2014-04-04 ENCOUNTER — Encounter: Payer: Self-pay | Admitting: Oncology

## 2014-04-04 DIAGNOSIS — L299 Pruritus, unspecified: Secondary | ICD-10-CM | POA: Insufficient documentation

## 2014-04-04 NOTE — Telephone Encounter (Signed)
Called patient to follow up on office visit on 12/18. Pt states her bowels are moving well, still has some abdominal tenderness. States benadryl has helped itching on back. No other complaints or problems. Is coming in for IVF on 04/05/14

## 2014-04-04 NOTE — Telephone Encounter (Signed)
-----   Message from Drue Second, NP sent at 04/04/2014 11:28 AM EST ----- PROVIDER:  Juluis Rainier ONLY Triage: follow up call 24-48 hours please.

## 2014-04-04 NOTE — Assessment & Plan Note (Signed)
Patient is complaining of some very mild pruritus to her entire back.  There is no rash whatsoever on exam.  However, patient has been scratching; there are some scratch marks and tiny scabs to her back.  Advised patient to take Benadryl 25 mg every 6 hours and Pepcid 20 mg every 12 hours to help with itching.  She may also try hydrocortisone cream or Benadryl cream to her back as well.

## 2014-04-04 NOTE — Progress Notes (Signed)
will   SYMPTOM MANAGEMENT CLINIC   HPI: Sherri Stafford 63 y.o. female diagnosed with lung cancer.  Currently undergoing Nivolumab and Xgeva therapy.    Patient presented to the [facility] today to receive additional IV fluid rehydration for mild dehydration symptoms.  She is complaining of some very vague, mild generalized abdominal discomfort.  She states she has no further issues with either constipation or diarrhea.  She denies any nausea or vomiting as well.  She denies any recent fevers or chills.  Also, patient is complaining of some generalized, mild itching to her entire back.  She denies any known rash; but has been scratching extensively-lesion some scabbed marks to her back.  Patient has tried Benadryl over-the-counter for her itching.    HPI   ROS  Past Medical History  Diagnosis Date  . Arthritis   . Bilateral ovarian cysts   . GERD (gastroesophageal reflux disease)   . Strain of hip flexor 06/2013    left side torn  . Cancer     lung ca  . Bone metastases     to left hip and spine  . Radiation     at [facility] to left hip, sacrum and brain  . Radiation 02/10/14-03/06/14    left central chest 35 gray    Past Surgical History  Procedure Laterality Date  . Dilation and curettage of uterus  2004    x 3   . Tubal ligation    . Colonoscopy  04/05/2004    normal     has Nonspecific abnormal results of liver function study; Cough; Sarcoidosis; Unspecified vitamin D deficiency; Dyspnea; Adenocarcinoma of lung; Chest pain; Pulmonary embolism; Acute bronchitis; Bone metastases; Abdominal pain; Nausea without vomiting; Long term current use of anticoagulant therapy; Dysuria; Dehydration; Hyperbilirubinemia; Hypoalbuminemia; Neoplasm related pain; Anemia in neoplastic disease; and Itching on her problem list.     is allergic to other; ketoconazole; lorazepam; morphine and related; nystatin; sporanox; tylenol; vitamin d; and zofran.    Medication List       This list is  accurate as of: 04/01/14 11:59 PM.  Always use your most recent med list.               albuterol 108 (90 BASE) MCG/ACT inhaler  Commonly known as:  PROVENTIL HFA;VENTOLIN HFA  Inhale 1-2 puffs into the lungs every 6 (six) hours as needed for wheezing or shortness of breath.     bisacodyl 10 MG suppository  Commonly known as:  DULCOLAX  Place 1 suppository (10 mg total) rectally daily as needed for moderate constipation.     calcium carbonate 1250 MG chewable tablet  Commonly known as:  OS-CAL  Chew 4 tablets by mouth daily.     codeine 30 MG tablet  Take 1 tablet (30 mg total) by mouth every 4 (four) hours as needed (cough).     [employer] SENNA PLUS 8.6-50 MG per tablet  Generic drug:  senna-docusate     docusate sodium 100 MG capsule  Commonly known as:  COLACE  Take 100 mg by mouth 2 (two) times daily.     dronabinol 2.5 MG capsule  Commonly known as:  MARINOL  Take 1 capsule (2.5 mg total) by mouth 2 (two) times daily before a meal.     enoxaparin 100 MG/ML injection  Commonly known as:  LOVENOX  ONLY INJECT 0.9 ML SUBCUTANEOUSLY EVERY DAY     esomeprazole 40 MG capsule  Commonly known as:  NEXIUM  Take  1 capsule (40 mg total) by mouth 2 (two) times daily before a meal.     fentaNYL 50 MCG/HR  Commonly known as:  DURAGESIC - dosed mcg/hr  Place 1 patch (50 mcg total) onto the skin every 3 (three) days.     gabapentin 600 MG tablet  Commonly known as:  NEURONTIN  Take 600 mg by mouth 3 (three) times daily.     gabapentin 800 MG tablet  Commonly known as:  NEURONTIN     glycerin adult 2 G Supp  Place 1 suppository rectally once as needed for moderate constipation.     loratadine 10 MG tablet  Commonly known as:  CLARITIN  Take 1 tablet (10 mg total) by mouth daily.     OLANZapine 10 MG tablet  Commonly known as:  ZYPREXA     polyethylene glycol packet  Commonly known as:  MIRALAX / GLYCOLAX  Take 17 g by mouth daily as needed for moderate constipation.      polyvinyl alcohol 1.4 % ophthalmic solution  Commonly known as:  LIQUIFILM TEARS  Place 1 drop into both eyes as needed for dry eyes.     prochlorperazine 10 MG tablet  Commonly known as:  COMPAZINE  Take 1 tablet (10 mg total) by mouth every 6 (six) hours as needed for nausea or vomiting.     RADIAPLEX EX  Apply topically.     traMADol 50 MG tablet  Commonly known as:  ULTRAM  TAKE 1 TABLET BY MOUTH EVERY 6 HOURS AS NEEDED         PHYSICAL EXAMINATION  125/74, HR 106, temp 97.0  Physical Exam  Constitutional: She is oriented to person, place, and time. Vital signs are normal. She appears unhealthy. She appears cachectic.  HENT:  Head: Normocephalic and atraumatic.  Mouth/Throat: Oropharynx is clear and moist.  Eyes: Conjunctivae and EOM are normal. Pupils are equal, round, and reactive to light. Right eye exhibits no discharge. Left eye exhibits no discharge. No scleral icterus.  Neck: Normal range of motion. Neck supple. No JVD present. No tracheal deviation present. No thyromegaly present.  Cardiovascular: Normal rate, regular rhythm, normal heart sounds and intact distal pulses.   Pulmonary/Chest: Effort normal and breath sounds normal. No stridor. No respiratory distress. She has no wheezes. She has no rales. She exhibits no tenderness.  Abdominal: Soft. Bowel sounds are normal. She exhibits no distension and no mass. There is no tenderness. There is no rebound and no guarding.  Musculoskeletal: Normal range of motion. She exhibits no edema or tenderness.  Lymphadenopathy:    She has no cervical adenopathy.  Neurological: She is alert and oriented to person, place, and time.  Skin: Skin is warm and dry. No rash noted. No erythema. There is pallor.  Psychiatric: Affect normal.  Nursing note and vitals reviewed.   LABORATORY DATA:. No visits with results within 3 Day(s) from this visit. Latest known visit with results is:  Appointment on 03/25/2014  Component Date  Value Ref Range Status  . WBC 03/25/2014 3.3* 3.9 - 10.3 10e3/uL Final  . NEUT# 03/25/2014 2.5  1.5 - 6.5 10e3/uL Final  . HGB 03/25/2014 9.3* 11.6 - 15.9 g/dL Final  . HCT 03/25/2014 29.5* 34.8 - 46.6 % Final  . Platelets 03/25/2014 345  145 - 400 10e3/uL Final  . MCV 03/25/2014 82.3  79.5 - 101.0 fL Final  . MCH 03/25/2014 26.0  25.1 - 34.0 pg Final  . MCHC 03/25/2014 31.6  31.5 -  36.0 g/dL Final  . RBC 03/25/2014 3.59* 3.70 - 5.45 10e6/uL Final  . RDW 03/25/2014 20.1* 11.2 - 14.5 % Final  . lymph# 03/25/2014 0.4* 0.9 - 3.3 10e3/uL Final  . MONO# 03/25/2014 0.3  0.1 - 0.9 10e3/uL Final  . Eosinophils Absolute 03/25/2014 0.2  0.0 - 0.5 10e3/uL Final  . Basophils Absolute 03/25/2014 0.0  0.0 - 0.1 10e3/uL Final  . NEUT% 03/25/2014 74.8  38.4 - 76.8 % Final  . LYMPH% 03/25/2014 10.9* 14.0 - 49.7 % Final  . MONO% 03/25/2014 8.4  0.0 - 14.0 % Final  . EOS% 03/25/2014 5.1  0.0 - 7.0 % Final  . BASO% 03/25/2014 0.8  0.0 - 2.0 % Final  . TSH 03/25/2014 1.175  0.308 - 3.960 m(IU)/L Final  . Sodium 03/25/2014 138  136 - 145 mEq/L Final  . Potassium 03/25/2014 3.4* 3.5 - 5.1 mEq/L Final  . Chloride 03/25/2014 102  98 - 109 mEq/L Final  . CO2 03/25/2014 25  22 - 29 mEq/L Final  . Glucose 03/25/2014 143* 70 - 140 mg/dl Final  . BUN 03/25/2014 5.7* 7.0 - 26.0 mg/dL Final  . Creatinine 03/25/2014 0.7  0.6 - 1.1 mg/dL Final  . Total Bilirubin 03/25/2014 0.64  0.20 - 1.20 mg/dL Final  . Alkaline Phosphatase 03/25/2014 81  40 - 150 U/L Final  . AST 03/25/2014 12  5 - 34 U/L Final  . ALT 03/25/2014 13  0 - 55 U/L Final  . Total Protein 03/25/2014 6.3* 6.4 - 8.3 g/dL Final  . Albumin 03/25/2014 2.5* 3.5 - 5.0 g/dL Final  . Calcium 03/25/2014 8.8  8.4 - 10.4 mg/dL Final  . Anion Gap 03/25/2014 11  3 - 11 mEq/L Final  . EGFR 03/25/2014 >90  >90 ml/min/1.73 m2 Final   eGFR is calculated using the CKD-EPI Creatinine Equation (2009)     RADIOGRAPHIC STUDIES: No results found.  ASSESSMENT/PLAN:      Abdominal pain Patient is complaining of some very mild, vague generalized abdominal discomfort today.  She states that she is no longer constipated; her bowel movements are regular.  She denies any diarrhea or cramping.  There was no rebound tenderness on exam.  Upon reviewing patient's last scan obtained on 03/07/2014-it does appear that all previously noted lesions have decreased in size; with the exception of one new lesion to the spleen which is nonspecific; but could quite possibly be metastatic.  Will continue to monitor closely.  Advised patient to call or go directed to the emergency department if the weekend if she develops any worsening symptoms whatsoever.  Patient states that she continues with her fentanyl patch; and occasional tramadol only.  Advised patient to continue with tramadol on an as-needed basis.    Adenocarcinoma of lung Patient received cycle 6 of her Nivolumab therapy on 03/25/2014.  She is scheduled to receive cycle 7 of the same regimen on 04/07/2014.  Bone metastases Patient has been diagnosed with bone metastasis.  She last received Xgeva on 02/24/2014.  Advised patient would confirm continuation of the Xgeva therapy; and let her know.  Patient continues to use the fentanyl patch and occasional tramadol for breakthrough pain to manage her bone pain.  Dehydration Patient has been feeling fairly dehydrated; and presented to the [place] today to receive IV fluid rehydration.  She is also scheduled to return on 04/05/2014 for additional IV fluid rehydration.  Itching Patient is complaining of some very mild pruritus to her entire back.  There is  no rash whatsoever on exam.  However, patient has been scratching; there are some scratch marks and tiny scabs to her back.  Advised patient to take Benadryl 25 mg every 6 hours and Pepcid 20 mg every 12 hours to help with itching.  She may also try hydrocortisone cream or Benadryl cream to her back as well.   Patient  stated understanding of all instructions; and was in agreement with this plan of care. The patient knows to call the clinic with any problems, questions or concerns.   Review/collaboration with Dr. Julien Nordmann regarding all aspects of patient's visit today.   Total time spent with patient was 15 minutes;  with greater than 80 percent of that time spent in face to face counseling regarding her symptoms and coordination of care and follow up.  Disclaimer: This note was dictated with voice recognition software. Similar sounding words can inadvertently be transcribed and may not be corrected upon review.   Drue Second, NP 04/04/2014

## 2014-04-04 NOTE — Assessment & Plan Note (Signed)
Patient has been feeling fairly dehydrated; and presented to the [facility] today to receive IV fluid rehydration.  She is also scheduled to return on 04/05/2014 for additional IV fluid rehydration.

## 2014-04-04 NOTE — Assessment & Plan Note (Addendum)
Patient is complaining of some very mild, vague generalized abdominal discomfort today.  She states that she is no longer constipated; her bowel movements are regular.  She denies any diarrhea or cramping.  There was no rebound tenderness on exam.  Upon reviewing patient's last scan obtained on 03/07/2014-it does appear that all previously noted lesions have decreased in size; with the exception of one new lesion to the spleen which is nonspecific; but could quite possibly be metastatic.  Will continue to monitor closely.  Advised patient to call or go directed to the emergency department if the weekend if she develops any worsening symptoms whatsoever.  Patient states that she continues with her fentanyl patch; and occasional tramadol only.  Advised patient to continue with tramadol on an as-needed basis.

## 2014-04-04 NOTE — Assessment & Plan Note (Addendum)
Patient received cycle 6 of her Nivolumab therapy on 03/25/2014.  She is scheduled to receive cycle 7 of the same regimen on 04/07/2014.

## 2014-04-04 NOTE — Assessment & Plan Note (Signed)
Patient has been diagnosed with bone metastasis.  She last received Xgeva on 02/24/2014.  Advised patient would confirm continuation of the Xgeva therapy; and let her know.  Patient continues to use the fentanyl patch and occasional tramadol for breakthrough pain to manage her bone pain.

## 2014-04-05 ENCOUNTER — Other Ambulatory Visit: Payer: Self-pay | Admitting: Internal Medicine

## 2014-04-05 ENCOUNTER — Ambulatory Visit (HOSPITAL_BASED_OUTPATIENT_CLINIC_OR_DEPARTMENT_OTHER): Payer: BC Managed Care – PPO

## 2014-04-05 ENCOUNTER — Other Ambulatory Visit: Payer: Self-pay | Admitting: Nurse Practitioner

## 2014-04-05 VITALS — BP 114/67 | HR 98 | Temp 98.1°F | Resp 16

## 2014-04-05 DIAGNOSIS — C3490 Malignant neoplasm of unspecified part of unspecified bronchus or lung: Secondary | ICD-10-CM

## 2014-04-05 DIAGNOSIS — Z95828 Presence of other vascular implants and grafts: Secondary | ICD-10-CM

## 2014-04-05 DIAGNOSIS — E860 Dehydration: Secondary | ICD-10-CM

## 2014-04-05 MED ORDER — SODIUM CHLORIDE 0.9 % IJ SOLN
10.0000 mL | INTRAMUSCULAR | Status: DC | PRN
  Administered 2014-04-05: 10 mL via INTRAVENOUS
  Filled 2014-04-05: qty 10

## 2014-04-05 MED ORDER — HEPARIN SOD (PORK) LOCK FLUSH 100 UNIT/ML IV SOLN
500.0000 [IU] | Freq: Once | INTRAVENOUS | Status: AC
  Administered 2014-04-05: 500 [IU] via INTRAVENOUS
  Filled 2014-04-05: qty 5

## 2014-04-05 MED ORDER — SODIUM CHLORIDE 0.9 % IV SOLN
INTRAVENOUS | Status: AC
  Administered 2014-04-05: 14:00:00 via INTRAVENOUS

## 2014-04-05 NOTE — Patient Instructions (Signed)
Dehydration, Adult Dehydration is when you lose more fluids from the body than you take in. Vital organs like the kidneys, brain, and heart cannot function without a proper amount of fluids and salt. Any loss of fluids from the body can cause dehydration.  CAUSES   Vomiting.  Diarrhea.  Excessive sweating.  Excessive urine output.  Fever. SYMPTOMS  Mild dehydration  Thirst.  Dry lips.  Slightly dry mouth. Moderate dehydration  Very dry mouth.  Sunken eyes.  Skin does not bounce back quickly when lightly pinched and released.  Dark urine and decreased urine production.  Decreased tear production.  Headache. Severe dehydration  Very dry mouth.  Extreme thirst.  Rapid, weak pulse (more than 100 beats per minute at rest).  Cold hands and feet.  Not able to sweat in spite of heat and temperature.  Rapid breathing.  Blue lips.  Confusion and lethargy.  Difficulty being awakened.  Minimal urine production.  No tears. DIAGNOSIS  Your caregiver will diagnose dehydration based on your symptoms and your exam. Blood and urine tests will help confirm the diagnosis. The diagnostic evaluation should also identify the cause of dehydration. TREATMENT  Treatment of mild or moderate dehydration can often be done at home by increasing the amount of fluids that you drink. It is best to drink small amounts of fluid more often. Drinking too much at one time can make vomiting worse. Refer to the home care instructions below. Severe dehydration needs to be treated at the hospital where you will probably be given intravenous (IV) fluids that contain water and electrolytes. HOME CARE INSTRUCTIONS   Ask your caregiver about specific rehydration instructions.  Drink enough fluids to keep your urine clear or pale yellow.  Drink small amounts frequently if you have nausea and vomiting.  Eat as you normally do.  Avoid:  Foods or drinks high in sugar.  Carbonated  drinks.  Juice.  Extremely hot or cold fluids.  Drinks with caffeine.  Fatty, greasy foods.  Alcohol.  Tobacco.  Overeating.  Gelatin desserts.  Wash your hands well to avoid spreading bacteria and viruses.  Only take over-the-counter or prescription medicines for pain, discomfort, or fever as directed by your caregiver.  Ask your caregiver if you should continue all prescribed and over-the-counter medicines.  Keep all follow-up appointments with your caregiver. SEEK MEDICAL CARE IF:  You have abdominal pain and it increases or stays in one area (localizes).  You have a rash, stiff neck, or severe headache.  You are irritable, sleepy, or difficult to awaken.  You are weak, dizzy, or extremely thirsty. SEEK IMMEDIATE MEDICAL CARE IF:   You are unable to keep fluids down or you get worse despite treatment.  You have frequent episodes of vomiting or diarrhea.  You have blood or green matter (bile) in your vomit.  You have blood in your stool or your stool looks black and tarry.  You have not urinated in 6 to 8 hours, or you have only urinated a small amount of very dark urine.  You have a fever.  You faint. MAKE SURE YOU:   Understand these instructions.  Will watch your condition.  Will get help right away if you are not doing well or get worse. Document Released: 04/01/2005 Document Revised: 06/24/2011 Document Reviewed: 11/19/2010 ExitCare Patient Information 2015 [employer]. This information is not intended to replace advice given to you by your health care provider. Make sure you discuss any questions you have with your health care   provider.  

## 2014-04-07 ENCOUNTER — Other Ambulatory Visit: Payer: Self-pay | Admitting: Medical Oncology

## 2014-04-07 ENCOUNTER — Encounter: Payer: Self-pay | Admitting: Radiation Oncology

## 2014-04-07 ENCOUNTER — Ambulatory Visit (HOSPITAL_BASED_OUTPATIENT_CLINIC_OR_DEPARTMENT_OTHER): Payer: BC Managed Care – PPO

## 2014-04-07 ENCOUNTER — Telehealth: Payer: Self-pay | Admitting: Internal Medicine

## 2014-04-07 ENCOUNTER — Encounter: Payer: Self-pay | Admitting: Internal Medicine

## 2014-04-07 ENCOUNTER — Ambulatory Visit (HOSPITAL_BASED_OUTPATIENT_CLINIC_OR_DEPARTMENT_OTHER): Payer: BC Managed Care – PPO | Admitting: Internal Medicine

## 2014-04-07 ENCOUNTER — Telehealth: Payer: Self-pay | Admitting: Medical Oncology

## 2014-04-07 ENCOUNTER — Other Ambulatory Visit: Payer: BC Managed Care – PPO

## 2014-04-07 ENCOUNTER — Ambulatory Visit: Payer: BC Managed Care – PPO

## 2014-04-07 ENCOUNTER — Other Ambulatory Visit (HOSPITAL_BASED_OUTPATIENT_CLINIC_OR_DEPARTMENT_OTHER): Payer: BC Managed Care – PPO

## 2014-04-07 ENCOUNTER — Ambulatory Visit
Admission: RE | Admit: 2014-04-07 | Discharge: 2014-04-07 | Disposition: A | Discharge status: Home / Self Care | Payer: BC Managed Care – PPO | Source: Ambulatory Visit | Attending: Radiation Oncology | Admitting: Radiation Oncology

## 2014-04-07 VITALS — BP 107/67 | HR 101 | Temp 97.9°F | Resp 20 | Ht 63.0 in | Wt 115.3 lb

## 2014-04-07 DIAGNOSIS — C3490 Malignant neoplasm of unspecified part of unspecified bronchus or lung: Secondary | ICD-10-CM

## 2014-04-07 DIAGNOSIS — C787 Secondary malignant neoplasm of liver and intrahepatic bile duct: Secondary | ICD-10-CM

## 2014-04-07 DIAGNOSIS — C3432 Malignant neoplasm of lower lobe, left bronchus or lung: Secondary | ICD-10-CM

## 2014-04-07 DIAGNOSIS — C7931 Secondary malignant neoplasm of brain: Secondary | ICD-10-CM

## 2014-04-07 DIAGNOSIS — C770 Secondary and unspecified malignant neoplasm of lymph nodes of head, face and neck: Secondary | ICD-10-CM

## 2014-04-07 DIAGNOSIS — Z79899 Other long term (current) drug therapy: Secondary | ICD-10-CM

## 2014-04-07 DIAGNOSIS — Z95828 Presence of other vascular implants and grafts: Secondary | ICD-10-CM

## 2014-04-07 DIAGNOSIS — C7951 Secondary malignant neoplasm of bone: Secondary | ICD-10-CM

## 2014-04-07 DIAGNOSIS — Z5112 Encounter for antineoplastic immunotherapy: Secondary | ICD-10-CM

## 2014-04-07 LAB — COMPREHENSIVE METABOLIC PANEL (CC13)
ALT: 9 U/L (ref 0–55)
AST: 12 U/L (ref 5–34)
Albumin: 2.7 g/dL — ABNORMAL LOW (ref 3.5–5.0)
Alkaline Phosphatase: 65 U/L (ref 40–150)
Anion Gap: 10 mEq/L (ref 3–11)
BUN: 6 mg/dL — ABNORMAL LOW (ref 7.0–26.0)
CO2: 26 meq/L (ref 22–29)
Calcium: 9 mg/dL (ref 8.4–10.4)
Chloride: 105 mEq/L (ref 98–109)
Creatinine: 0.7 mg/dL (ref 0.6–1.1)
EGFR: >90 mL/min/{1.73_m2} (ref >90)
Glucose: 133 mg/dl (ref 70–140)
Potassium: 3.1 mEq/L — ABNORMAL LOW (ref 3.5–5.1)
SODIUM: 142 meq/L (ref 136–145)
TOTAL PROTEIN: 6.1 g/dL — AB (ref 6.4–8.3)
Total Bilirubin: 0.51 mg/dL (ref 0.20–1.20)

## 2014-04-07 LAB — CBC WITH DIFFERENTIAL/PLATELET
BASO%: 0.6 % (ref 0.0–2.0)
Basophils Absolute: 0 10*3/uL (ref 0.0–0.1)
EOS ABS: 0.2 10*3/uL (ref 0.0–0.5)
EOS%: 5.6 % (ref 0.0–7.0)
HCT: 29.6 % — ABNORMAL LOW (ref 34.8–46.6)
HGB: 9.3 g/dL — ABNORMAL LOW (ref 11.6–15.9)
LYMPH%: 13.5 % — ABNORMAL LOW (ref 14.0–49.7)
MCH: 26.6 pg (ref 25.1–34.0)
MCHC: 31.4 g/dL — ABNORMAL LOW (ref 31.5–36.0)
MCV: 84.6 fL (ref 79.5–101.0)
MONO#: 0.3 10*3/uL (ref 0.1–0.9)
MONO%: 9.1 % (ref 0.0–14.0)
NEUT#: 2.4 10*3/uL (ref 1.5–6.5)
NEUT%: 71.2 % (ref 38.4–76.8)
Platelets: 225 10*3/uL (ref 145–400)
RBC: 3.5 10*6/uL — AB (ref 3.70–5.45)
RDW: 19.7 % — AB (ref 11.2–14.5)
WBC: 3.4 10*3/uL — ABNORMAL LOW (ref 3.9–10.3)
lymph#: 0.5 10*3/uL — ABNORMAL LOW (ref 0.9–3.3)

## 2014-04-07 LAB — TSH CHCC: TSH: 2.632 m[IU]/L (ref 0.308–3.960)

## 2014-04-07 MED ORDER — POTASSIUM CHLORIDE CRYS ER 20 MEQ PO TBCR: 40.0000 meq | EXTENDED_RELEASE_TABLET | Freq: Every day | ORAL | Status: DC

## 2014-04-07 MED ORDER — SODIUM CHLORIDE 0.9 % IV SOLN
Freq: Once | INTRAVENOUS | Status: AC
  Administered 2014-04-07: 10:00:00 via INTRAVENOUS

## 2014-04-07 MED ORDER — HEPARIN SOD (PORK) LOCK FLUSH 100 UNIT/ML IV SOLN
500.0000 [IU] | Freq: Once | INTRAVENOUS | Status: AC | PRN
  Administered 2014-04-07: 500 [IU]
  Filled 2014-04-07: qty 5

## 2014-04-07 MED ORDER — SODIUM CHLORIDE 0.9 % IJ SOLN
10.0000 mL | INTRAMUSCULAR | Status: DC | PRN
  Administered 2014-04-07: 10 mL via INTRAVENOUS
  Filled 2014-04-07: qty 10

## 2014-04-07 MED ORDER — HYDROCORTISONE 1 % EX LOTN: 1.0000 "application " | TOPICAL_LOTION | Freq: Two times a day (BID) | CUTANEOUS | Status: DC

## 2014-04-07 MED ORDER — SODIUM CHLORIDE 0.9 % IV SOLN
3.0000 mg/kg | Freq: Once | INTRAVENOUS | Status: AC
  Administered 2014-04-07: 160 mg via INTRAVENOUS
  Filled 2014-04-07: qty 16

## 2014-04-07 MED ORDER — SODIUM CHLORIDE 0.9 % IJ SOLN
10.0000 mL | INTRAMUSCULAR | Status: AC | PRN
  Administered 2014-04-07: 10 mL
  Filled 2014-04-07: qty 10

## 2014-04-07 NOTE — Telephone Encounter (Signed)
Faxed scheduling request to [facility] for ivf for next Tuesday and Thursday.

## 2014-04-07 NOTE — Progress Notes (Signed)
[place] Telephone:([phone]   [phone]) [phone]  OFFICE PROGRESS NOTE  [name], MD [place] [address]  DIAGNOSIS: stage IV (T2a, N3, M1b) non-small cell lung cancer, adenocarcinoma presented with large left lower lobe lung mass in addition to bilateral pulmonary nodules and mediastinal and supraclavicular lymphadenopathy diagnosed in May of 2015.  Genomic Alterations Identified? ERBB3 amplification CDK4 amplification IDH2 R172S KRAS G12D MDM2 amplification RBM10 G439f*36 TERC amplification - equivocal? Additional Disease-relevant Genes with No Reportable Alterations Identified? RET ALK BRAF ERBB2 MET EGFR  PRIOR THERAPY:  1) status post palliative [facility] left [facility]  status post stereotactic radiotherapy to brain lesions [facility] 2) Systemic chemotherapy with carboplatin for AUC of 5, Alimta 500 mg/M2 and Avastin 15 mg/KG every 3 weeks, status post 5 cycles. [facility]   CURRENT THERAPY:  1) immunotherapy with Nivolmab at 3 mg/kg given every 3 weeks. Status post 6 cycles  2) Xgeva 120 mcg subcutaneously every 2 months.  INTERVAL HISTORY: Sherri SARAVIA63y.o. female returns to the clinic today for follow-up visit accompanied by her brother and nephew. The patient is feeling fine today except for occasional abdominal pain that she rates as 3 on scale from 1-10. She is currently undergoing immunotherapy with Nivolumab status post 6 cycles and tolerating her treatment fairly well. She was here today for cycle #7. The patient denied having any significant chest pain but continues to have shortness of breath with exertion with no significant cough or hemoptysis. She has no  significant weight loss or night sweats. She still has itching on the lower back from the port of the radiotherapy. She used over the counter hydrocortisone cream but was not effective and she would like to have a stronger concentration. She still likes to have IV fluids at least twice a week during her treatment. I think it gives her more security then real benefit.  MEDICAL HISTORY: Past Medical History  Diagnosis Date  . Arthritis   . Bilateral ovarian cysts   . GERD (gastroesophageal reflux disease)   . Strain of hip flexor 06/2013    left side torn  . Cancer     lung ca  . Bone metastases     to left hip and spine  . Radiation     at [facility] to left hip, sacrum and brain  . Radiation 02/10/14-03/06/14    left central chest 35 gray    ALLERGIES:  is allergic to other; ketoconazole; lorazepam; morphine and related; nystatin; sporanox; tylenol; vitamin d; and zofran.  MEDICATIONS:  Current Outpatient Prescriptions  Medication Sig Dispense Refill  . enoxaparin (LOVENOX) 100 MG/ML injection ONLY INJECT 0.9 ML SUBCUTANEOUSLY EVERY DAY 30 Syringe 0  . albuterol (PROVENTIL HFA;VENTOLIN HFA) 108 (90 BASE) MCG/ACT inhaler Inhale 1-2 puffs into the lungs every 6 (six) hours as needed for wheezing or shortness of breath. (Patient not taking: Reported on 04/07/2014) 1 Inhaler 2  . bisacodyl (DULCOLAX) 10 MG suppository Place 1 suppository (10 mg total) rectally daily as needed for moderate constipation. (Patient not taking: Reported on 04/07/2014) 10 suppository 0  . calcium carbonate (OS-CAL) 1250 MG chewable tablet Chew 4 tablets by mouth daily.    . codeine 30 MG tablet  Take 1 tablet (30 mg total) by mouth every 4 (four) hours as needed (cough). (Patient not taking: Reported on 04/07/2014) 80 tablet 0  . [employer] SENNA PLUS 8.6-50 MG per tablet   11  . diphenhydrAMINE (SOMINEX) 25 MG tablet Take 25 mg by mouth at bedtime as needed for sleep. Takes 2 tablets every 3-4 hours during the night.    .  docusate sodium (COLACE) 100 MG capsule Take 100 mg by mouth 2 (two) times daily.    Marland Kitchen dronabinol (MARINOL) 2.5 MG capsule Take 1 capsule (2.5 mg total) by mouth 2 (two) times daily before a meal. (Patient not taking: Reported on 04/07/2014) 60 capsule 0  . esomeprazole (NEXIUM) 40 MG capsule Take 1 capsule (40 mg total) by mouth 2 (two) times daily before a meal. (Patient not taking: Reported on 04/07/2014) 60 capsule 0  . fentaNYL (DURAGESIC - DOSED MCG/HR) 50 MCG/HR Place 1 patch (50 mcg total) onto the skin every 3 (three) days. (Patient not taking: Reported on 04/07/2014) 10 patch 0  . gabapentin (NEURONTIN) 600 MG tablet Take 600 mg by mouth 3 (three) times daily.    Marland Kitchen gabapentin (NEURONTIN) 800 MG tablet     . glycerin adult 2 G SUPP Place 1 suppository rectally once as needed for moderate constipation.    Marland Kitchen loratadine (CLARITIN) 10 MG tablet Take 1 tablet (10 mg total) by mouth daily. (Patient not taking: Reported on 04/07/2014) 15 tablet 0  . OLANZapine (ZYPREXA) 10 MG tablet     . polyethylene glycol (MIRALAX / GLYCOLAX) packet Take 17 g by mouth daily as needed for moderate constipation.    . polyvinyl alcohol (LIQUIFILM TEARS) 1.4 % ophthalmic solution Place 1 drop into both eyes as needed for dry eyes.     Marland Kitchen prochlorperazine (COMPAZINE) 10 MG tablet Take 1 tablet (10 mg total) by mouth every 6 (six) hours as needed for nausea or vomiting. (Patient not taking: Reported on 04/07/2014) 30 tablet 1  . traMADol (ULTRAM) 50 MG tablet TAKE 1 TABLET BY MOUTH EVERY 6 HOURS AS NEEDED (Patient not taking: Reported on 04/07/2014) 60 tablet 0  . Wound Cleansers (RADIAPLEX EX) Apply topically.     No current facility-administered medications for this visit.   Facility-Administered Medications Ordered in Other Visits  Medication Dose Route Frequency Provider Last Rate Last Dose  . sodium chloride 0.9 % injection 10 mL  10 mL Intracatheter PRN Curt Bears, MD   10 mL at 04/07/14 1200     SURGICAL HISTORY:  Past Surgical History  Procedure Laterality Date  . Dilation and curettage of uterus  2004    x 3   . Tubal ligation    . Colonoscopy  04/05/2004    normal     REVIEW OF SYSTEMS:  A comprehensive review of systems was negative except for: Constitutional: positive for fatigue Respiratory: positive for dyspnea on exertion Gastrointestinal: positive for abdominal pain Integument/breast: positive for pruritus and rash   PHYSICAL EXAMINATION: General appearance: alert, cooperative, fatigued and no distress Head: Normocephalic, without obvious abnormality, atraumatic Neck: no adenopathy, no JVD, supple, symmetrical, trachea midline and thyroid not enlarged, symmetric, no tenderness/mass/nodules Lymph nodes: Cervical, supraclavicular, and axillary nodes normal. Resp: clear to auscultation bilaterally Back: symmetric, no curvature. ROM normal. No CVA tenderness. Cardio: regular rate and rhythm, S1, S2 normal, no murmur, click, rub or gallop GI: soft, non-tender; bowel sounds normal; no masses,  no organomegaly Extremities: extremities normal, atraumatic, no cyanosis or edema  ECOG PERFORMANCE  STATUS: 2 - Symptomatic, <50% confined to bed  Last menstrual period 01/14/2008.  LABORATORY DATA: Lab Results  Component Value Date   WBC 3.4* 04/07/2014   HGB 9.3* 04/07/2014   HCT 29.6* 04/07/2014   MCV 84.6 04/07/2014   PLT 225 04/07/2014      Chemistry      Component Value Date/Time   NA 142 04/07/2014 0902   NA 141 01/16/2014 0530   K 3.1* 04/07/2014 0902   K 3.5* 01/16/2014 0530   CL 106 01/16/2014 0530   CO2 26 04/07/2014 0902   CO2 24 01/16/2014 0530   BUN 6.0* 04/07/2014 0902   BUN 7 01/16/2014 0530   CREATININE 0.7 04/07/2014 0902   CREATININE 0.63 01/16/2014 0530   CREATININE 0.83 09/07/2013 1506      Component Value Date/Time   CALCIUM 9.0 04/07/2014 0902   CALCIUM 8.0* 01/16/2014 0530   ALKPHOS 65 04/07/2014 0902   ALKPHOS 66 11/14/2013  1057   AST 12 04/07/2014 0902   AST 12 11/14/2013 1057   ALT 9 04/07/2014 0902   ALT 15 11/14/2013 1057   BILITOT 0.51 04/07/2014 0902   BILITOT 1.4* 11/14/2013 1057       RADIOGRAPHIC STUDIES: No results found.  ASSESSMENT AND PLAN: This is a very pleasant 63 years old white female with metastatic non-small cell lung cancer, adenocarcinoma status post induction chemotherapy with carboplatin and Alimta but unfortunately has evidence for disease progression. She is currently undergoing immunotherapy with Nivolumab status post 6 cycles and tolerating her treatment fairly well. The patient is doing very well with this treatment and she has significant improvement in her symptoms. I recommended for her to proceed with cycle #7 today as scheduled. She would come back for follow-up visit in 2 weeks with the next cycle of her treatment. For the abdominal and back pain, she will continue on gabapentin and tramadol. For concern about dehydration, the patient will continue with IV normal saline on Tuesday and Thursday weekly. For the skin rash and itching of the back, I started the patient on hydrocortisone 1% lotion. She was advised to call immediately if she has any other concerning symptoms in the interval. The patient voices understanding of current disease status and treatment options and is in agreement with the current care plan.  All questions were answered. The patient knows to call the clinic with any problems, questions or concerns. We can certainly see the patient much sooner if necessary.  I spent 15 minutes counseling the patient face to face. The total time spent in the appointment was 25 minutes.  Disclaimer: This note was dictated with voice recognition software. Similar sounding words can inadvertently be transcribed and may not be corrected upon review.

## 2014-04-07 NOTE — Progress Notes (Signed)
Radiation Oncology         [phone] ________________________________  Name: Sherri Stafford MRN: [id number]  Date: 04/07/2014  DOB: 1951/02/12  Follow-Up Visit Note  CC: [name], MD  Curt Bears, MD    ICD-9-CM ICD-10-CM   1. Adenocarcinoma of lung, unspecified laterality 162.9 C34.90       Diagnosis: stage IV (T2a, N3, M1b) non-small cell lung cancer   Interval Since Last Radiation:  1  months  Narrative:  The patient returns today for routine follow-up.  She is doing reasonably well at this time. She denies any further problems with coughing. She denies any swallowing difficulties or breathing issues. She occasionally will have some discomfort in the abdominal area.  Currently undergoing Nivolumab and Xgeva therapy.                            ALLERGIES:  is allergic to other; ketoconazole; lorazepam; morphine and related; nystatin; sporanox; tylenol; vitamin d; and zofran.  Meds: Current Outpatient Prescriptions  Medication Sig Dispense Refill  . albuterol (PROVENTIL HFA;VENTOLIN HFA) 108 (90 BASE) MCG/ACT inhaler Inhale 1-2 puffs into the lungs every 6 (six) hours as needed for wheezing or shortness of breath. (Patient not taking: Reported on 03/25/2014) 1 Inhaler 2  . bisacodyl (DULCOLAX) 10 MG suppository Place 1 suppository (10 mg total) rectally daily as needed for moderate constipation. (Patient not taking: Reported on 03/11/2014) 10 suppository 0  . calcium carbonate (OS-CAL) 1250 MG chewable tablet Chew 4 tablets by mouth daily.    . codeine 30 MG tablet Take 1 tablet (30 mg total) by mouth every 4 (four) hours as needed (cough). (Patient not taking: Reported on 03/11/2014) 80 tablet 0  . [facility] SENNA PLUS 8.6-50 MG per tablet   11  . docusate sodium (COLACE) 100 MG capsule Take 100 mg by mouth 2 (two) times daily.    Marland Kitchen dronabinol (MARINOL) 2.5 MG capsule Take 1 capsule (2.5 mg total) by mouth 2 (two) times daily before a meal. 60 capsule 0  .  enoxaparin (LOVENOX) 100 MG/ML injection ONLY INJECT 0.9 ML SUBCUTANEOUSLY EVERY DAY 30 Syringe 0  . esomeprazole (NEXIUM) 40 MG capsule Take 1 capsule (40 mg total) by mouth 2 (two) times daily before a meal. 60 capsule 0  . fentaNYL (DURAGESIC - DOSED MCG/HR) 50 MCG/HR Place 1 patch (50 mcg total) onto the skin every 3 (three) days. 10 patch 0  . gabapentin (NEURONTIN) 600 MG tablet Take 600 mg by mouth 3 (three) times daily.    Marland Kitchen gabapentin (NEURONTIN) 800 MG tablet     . glycerin adult 2 G SUPP Place 1 suppository rectally once as needed for moderate constipation.    Marland Kitchen loratadine (CLARITIN) 10 MG tablet Take 1 tablet (10 mg total) by mouth daily. 15 tablet 0  . OLANZapine (ZYPREXA) 10 MG tablet     . polyethylene glycol (MIRALAX / GLYCOLAX) packet Take 17 g by mouth daily as needed for moderate constipation.    . polyvinyl alcohol (LIQUIFILM TEARS) 1.4 % ophthalmic solution Place 1 drop into both eyes as needed for dry eyes.     Marland Kitchen prochlorperazine (COMPAZINE) 10 MG tablet Take 1 tablet (10 mg total) by mouth every 6 (six) hours as needed for nausea or vomiting. 30 tablet 1  . traMADol (ULTRAM) 50 MG tablet TAKE 1 TABLET BY MOUTH EVERY 6 HOURS AS NEEDED 60 tablet 0  . Wound Cleansers (RADIAPLEX EX) Apply  topically.     No current facility-administered medications for this encounter.   Facility-Administered Medications Ordered in Other Encounters  Medication Dose Route Frequency Provider Last Rate Last Dose  . 0.9 %  sodium chloride infusion   Intravenous Once Curt Bears, MD      . heparin lock flush 100 unit/mL  500 Units Intracatheter Once PRN Curt Bears, MD      . nivolumab (OPDIVO) 160 mg in sodium chloride 0.9 % 100 mL chemo infusion  3 mg/kg (Treatment Plan Actual) Intravenous Once Curt Bears, MD      . sodium chloride 0.9 % injection 10 mL  10 mL Intracatheter PRN Curt Bears, MD        Physical Findings: The patient is in no acute distress. Patient is alert and  oriented.  vitals were not taken for this visit..  IV in place for Nivolumab.  The lungs are clear. The heart has a regular rhythm and rate.  Lab Findings: Lab Results  Component Value Date   WBC 3.4* 04/07/2014   HGB 9.3* 04/07/2014   HCT 29.6* 04/07/2014   MCV 84.6 04/07/2014   PLT 225 04/07/2014    Radiographic Findings: No results found.  Impression:  The patient is recovering from the effects of radiation.  Recent CT scans show good response to her palliative radiation therapy along the left central chest area  Plan:  When necessary follow-up in radiation oncology. The patient will continue close follow-up with medical oncology as above.  ____________________________________ Blair Promise, MD

## 2014-04-07 NOTE — Telephone Encounter (Signed)
-----   Message from Carlton Adam, PA-C sent at 04/07/2014 12:47 PM EST ----- Abnormal results, please call in following prescription and notify patient KCl 40 meq by mouth daily for 7 days

## 2014-04-07 NOTE — Patient Instructions (Signed)
Nivolumab injection What is this medicine? NIVOLUMAB (nye VOL ue mab) is used to treat certain types of melanoma and lung cancer. This medicine may be used for other purposes; ask your health care provider or pharmacist if you have questions. COMMON BRAND NAME(S): Opdivo What should I tell my health care provider before I take this medicine? They need to know if you have any of these conditions: -eye disease, vision problems -history of pancreatitis -immune system problems -inflammatory bowel disease -kidney disease -liver disease -lung disease -lupus -myasthenia gravis -multiple sclerosis -organ transplant -stomach or intestine problems -thyroid disease -tingling of the fingers or toes, or other nerve disorder -an unusual or allergic reaction to nivolumab, other medicines, foods, dyes, or preservatives -pregnant or trying to get pregnant -breast-feeding How should I use this medicine? This medicine is for infusion into a vein. It is given by a health care professional in a hospital or clinic setting. A special MedGuide will be given to you before each treatment. Be sure to read this information carefully each time. Talk to your pediatrician regarding the use of this medicine in children. Special care may be needed. Overdosage: If you think you've taken too much of this medicine contact a poison control center or emergency room at once. Overdosage: If you think you have taken too much of this medicine contact a poison control center or emergency room at once. NOTE: This medicine is only for you. Do not share this medicine with others. What if I miss a dose? It is important not to miss your dose. Call your doctor or health care professional if you are unable to keep an appointment. What may interact with this medicine? Interactions have not been studied. This list may not describe all possible interactions. Give your health care provider a list of all the medicines, herbs,  non-prescription drugs, or dietary supplements you use. Also tell them if you smoke, drink alcohol, or use illegal drugs. Some items may interact with your medicine. What should I watch for while using this medicine? Tell your doctor or healthcare professional if your symptoms do not start to get better or if they get worse. Your condition will be monitored carefully while you are receiving this medicine. You may need blood work done while you are taking this medicine. What side effects may I notice from receiving this medicine? Side effects that you should report to your doctor or health care professional as soon as possible: -allergic reactions like skin rash, itching or hives, swelling of the face, lips, or tongue -black, tarry stools -bloody or watery diarrhea -changes in vision -chills -cough -depressed mood -eye pain -feeling anxious -fever -general ill feeling or flu-like symptoms -hair loss -loss of appetite -low blood counts - this medicine may decrease the number of white blood cells, red blood cells and platelets. You may be at increased risk for infections and bleeding -pain, tingling, numbness in the hands or feet -redness, blistering, peeling or loosening of the skin, including inside the mouth -red pinpoint spots on skin -signs of decreased platelets or bleeding - bruising, pinpoint red spots on the skin, black, tarry stools, blood in the urine -signs of decreased red blood cells - unusually weak or tired, feeling faint or lightheaded, falls -signs of infection - fever or chills, cough, sore throat, pain or trouble passing urine -signs and symptoms of a dangerous change in heartbeat or heart rhythm like chest pain; dizziness; fast or irregular heartbeat; palpitations; feeling faint or lightheaded, falls; breathing problems -signs  and symptoms of high blood sugar such as dizziness; dry mouth; dry skin; fruity breath; nausea; stomach pain; increased hunger or thirst; increased  urination -signs and symptoms of kidney injury like trouble passing urine or change in the amount of urine -signs and symptoms of liver injury like dark yellow or brown urine; general ill feeling or flu-like symptoms; light-colored stools; loss of appetite; nausea; right upper belly pain; unusually weak or tired; yellowing of the eyes or skin -signs and symptoms of increased potassium like muscle weakness; chest pain; or fast, irregular heartbeat -signs and symptoms of low potassium like muscle cramps or muscle pain; chest pain; dizziness; feeling faint or lightheaded, falls; palpitations; breathing problems; or fast, irregular heartbeat -swelling of the ankles, feet, hands -weight gainSide effects that usually do not require medical attention (report to your doctor or health care professional if they continue or are bothersome): -constipation -general ill feeling or flu-like symptoms -hair loss -loss of appetite -nausea, vomiting This list may not describe all possible side effects. Call your doctor for medical advice about side effects. You may report side effects to FDA at 1-800-FDA-1088. Where should I keep my medicine? This drug is given in a hospital or clinic and will not be stored at home. NOTE: This sheet is a summary. It may not cover all possible information. If you have questions about this medicine, talk to your doctor, pharmacist, or health care provider.  2015, [employer]vier/Gold Standard. (2013-06-21 13:18:19)

## 2014-04-07 NOTE — Telephone Encounter (Signed)
, °

## 2014-04-07 NOTE — Telephone Encounter (Signed)
rx done

## 2014-04-07 NOTE — Progress Notes (Signed)
Sherri Stafford here in a wheelchair for follow up after treatment to her left central chest.  She has IV fluids infusing, 500 ml/hr of normal saline.  She reports pain in her lower abdomen that feels like she was doing sit ups.  She said the pain started last Monday.  She denies having a cough, sore throat and trouble swallowing.  She reports shortness of breath with activity.  Her oxygen saturation today was 100% on room air.  She is having itching, mostly at night, on her left lower back.  She has a red area on her left lower back.  She has been taking 2 25 mg benadryl tablets 3-4 times at night and is using hydrocortizone cream without releif.  She reports the benadryl is keeping her awake.  She is fatigued.

## 2014-04-07 NOTE — Patient Instructions (Signed)

## 2014-04-11 ENCOUNTER — Encounter: Payer: Self-pay | Admitting: *Deleted

## 2014-04-11 ENCOUNTER — Other Ambulatory Visit: Payer: Self-pay | Admitting: Internal Medicine

## 2014-04-11 NOTE — Progress Notes (Signed)
Set up appt for sickle cell for 12/29 and 12/31 to get IVF's. Called patient and she said she didn't need them. Called sickle cell and cancelled appts.

## 2014-04-12 ENCOUNTER — Encounter (HOSPITAL_COMMUNITY): Payer: BC Managed Care – PPO

## 2014-04-14 ENCOUNTER — Encounter (HOSPITAL_COMMUNITY): Payer: BC Managed Care – PPO

## 2014-04-18 ENCOUNTER — Telehealth: Payer: Self-pay | Admitting: Internal Medicine

## 2014-04-18 NOTE — Telephone Encounter (Signed)
returned pt call and s.w. pt and cx ivf per pt request

## 2014-04-19 ENCOUNTER — Ambulatory Visit: Payer: BC Managed Care – PPO

## 2014-04-21 ENCOUNTER — Ambulatory Visit (HOSPITAL_BASED_OUTPATIENT_CLINIC_OR_DEPARTMENT_OTHER): Payer: BC Managed Care – PPO

## 2014-04-21 ENCOUNTER — Ambulatory Visit: Payer: BC Managed Care – PPO

## 2014-04-21 ENCOUNTER — Encounter: Payer: Self-pay | Admitting: Physician Assistant

## 2014-04-21 ENCOUNTER — Ambulatory Visit (HOSPITAL_BASED_OUTPATIENT_CLINIC_OR_DEPARTMENT_OTHER): Payer: BC Managed Care – PPO | Admitting: Physician Assistant

## 2014-04-21 ENCOUNTER — Telehealth: Payer: Self-pay | Admitting: Internal Medicine

## 2014-04-21 ENCOUNTER — Other Ambulatory Visit (HOSPITAL_BASED_OUTPATIENT_CLINIC_OR_DEPARTMENT_OTHER): Payer: BC Managed Care – PPO

## 2014-04-21 VITALS — BP 92/67 | HR 116 | Temp 97.9°F | Resp 16 | Wt 116.3 lb

## 2014-04-21 DIAGNOSIS — C3490 Malignant neoplasm of unspecified part of unspecified bronchus or lung: Secondary | ICD-10-CM

## 2014-04-21 DIAGNOSIS — C3491 Malignant neoplasm of unspecified part of right bronchus or lung: Secondary | ICD-10-CM

## 2014-04-21 DIAGNOSIS — C3432 Malignant neoplasm of lower lobe, left bronchus or lung: Secondary | ICD-10-CM

## 2014-04-21 DIAGNOSIS — C7951 Secondary malignant neoplasm of bone: Secondary | ICD-10-CM

## 2014-04-21 DIAGNOSIS — Z95828 Presence of other vascular implants and grafts: Secondary | ICD-10-CM

## 2014-04-21 DIAGNOSIS — Z5112 Encounter for antineoplastic immunotherapy: Secondary | ICD-10-CM

## 2014-04-21 LAB — CBC WITH DIFFERENTIAL/PLATELET
BASO%: 0.7 % (ref 0.0–2.0)
BASOS ABS: 0 10*3/uL (ref 0.0–0.1)
EOS%: 4.3 % (ref 0.0–7.0)
Eosinophils Absolute: 0.2 10*3/uL (ref 0.0–0.5)
HEMATOCRIT: 30.7 % — AB (ref 34.8–46.6)
HEMOGLOBIN: 9.7 g/dL — AB (ref 11.6–15.9)
LYMPH%: 11.2 % — ABNORMAL LOW (ref 14.0–49.7)
MCH: 26.5 pg (ref 25.1–34.0)
MCHC: 31.5 g/dL (ref 31.5–36.0)
MCV: 84.2 fL (ref 79.5–101.0)
MONO#: 0.4 10*3/uL (ref 0.1–0.9)
MONO%: 7.9 % (ref 0.0–14.0)
NEUT#: 3.5 10*3/uL (ref 1.5–6.5)
NEUT%: 75.9 % (ref 38.4–76.8)
Platelets: 228 10*3/uL (ref 145–400)
RBC: 3.65 10*6/uL — ABNORMAL LOW (ref 3.70–5.45)
RDW: 21.4 % — AB (ref 11.2–14.5)
WBC: 4.6 10*3/uL (ref 3.9–10.3)
lymph#: 0.5 10*3/uL — ABNORMAL LOW (ref 0.9–3.3)

## 2014-04-21 LAB — COMPREHENSIVE METABOLIC PANEL (CC13)
ALT: 10 U/L (ref 0–55)
AST: 16 U/L (ref 5–34)
Albumin: 3.1 g/dL — ABNORMAL LOW (ref 3.5–5.0)
Alkaline Phosphatase: 65 U/L (ref 40–150)
Anion Gap: 10 mEq/L (ref 3–11)
BUN: 14.1 mg/dL (ref 7.0–26.0)
CALCIUM: 8.6 mg/dL (ref 8.4–10.4)
CO2: 23 meq/L (ref 22–29)
Chloride: 106 mEq/L (ref 98–109)
Creatinine: 0.7 mg/dL (ref 0.6–1.1)
EGFR: >90 mL/min/{1.73_m2} (ref >90)
GLUCOSE: 85 mg/dL (ref 70–140)
Potassium: 4.1 mEq/L (ref 3.5–5.1)
Sodium: 139 mEq/L (ref 136–145)
Total Bilirubin: 0.94 mg/dL (ref 0.20–1.20)
Total Protein: 6.5 g/dL (ref 6.4–8.3)

## 2014-04-21 MED ORDER — DENOSUMAB 120 MG/1.7ML ~~LOC~~ SOLN
120.0000 mg | Freq: Once | SUBCUTANEOUS | Status: AC
  Administered 2014-04-21: 120 mg via SUBCUTANEOUS
  Filled 2014-04-21: qty 1.7

## 2014-04-21 MED ORDER — HEPARIN SOD (PORK) LOCK FLUSH 100 UNIT/ML IV SOLN
500.0000 [IU] | Freq: Once | INTRAVENOUS | Status: AC | PRN
  Administered 2014-04-21: 500 [IU]
  Filled 2014-04-21: qty 5

## 2014-04-21 MED ORDER — SODIUM CHLORIDE 0.9 % IV SOLN
Freq: Once | INTRAVENOUS | Status: AC
  Administered 2014-04-21: 14:00:00 via INTRAVENOUS

## 2014-04-21 MED ORDER — SODIUM CHLORIDE 0.9 % IJ SOLN
10.0000 mL | INTRAMUSCULAR | Status: DC | PRN
  Administered 2014-04-21: 10 mL
  Filled 2014-04-21: qty 10

## 2014-04-21 MED ORDER — ZOLPIDEM TARTRATE 5 MG PO TABS: 5.0000 mg | ORAL_TABLET | Freq: Every evening | ORAL | Status: DC | PRN

## 2014-04-21 MED ORDER — SODIUM CHLORIDE 0.9 % IJ SOLN
10.0000 mL | INTRAMUSCULAR | Status: DC | PRN
  Administered 2014-04-21: 10 mL via INTRAVENOUS
  Filled 2014-04-21: qty 10

## 2014-04-21 MED ORDER — SODIUM CHLORIDE 0.9 % IV SOLN
3.0000 mg/kg | Freq: Once | INTRAVENOUS | Status: AC
  Administered 2014-04-21: 160 mg via INTRAVENOUS
  Filled 2014-04-21: qty 16

## 2014-04-21 NOTE — Patient Instructions (Signed)
Nivolumab injection What is this medicine? NIVOLUMAB (nye VOL ue mab) is used to treat certain types of melanoma and lung cancer. This medicine may be used for other purposes; ask your health care provider or pharmacist if you have questions. COMMON BRAND NAME(S): Opdivo What should I tell my health care provider before I take this medicine? They need to know if you have any of these conditions: -eye disease, vision problems -history of pancreatitis -immune system problems -inflammatory bowel disease -kidney disease -liver disease -lung disease -lupus -myasthenia gravis -multiple sclerosis -organ transplant -stomach or intestine problems -thyroid disease -tingling of the fingers or toes, or other nerve disorder -an unusual or allergic reaction to nivolumab, other medicines, foods, dyes, or preservatives -pregnant or trying to get pregnant -breast-feeding How should I use this medicine? This medicine is for infusion into a vein. It is given by a health care professional in a hospital or clinic setting. A special MedGuide will be given to you before each treatment. Be sure to read this information carefully each time. Talk to your pediatrician regarding the use of this medicine in children. Special care may be needed. Overdosage: If you think you've taken too much of this medicine contact a poison control center or emergency room at once. Overdosage: If you think you have taken too much of this medicine contact a poison control center or emergency room at once. NOTE: This medicine is only for you. Do not share this medicine with others. What if I miss a dose? It is important not to miss your dose. Call your doctor or health care professional if you are unable to keep an appointment. What may interact with this medicine? Interactions have not been studied. This list may not describe all possible interactions. Give your health care provider a list of all the medicines, herbs,  non-prescription drugs, or dietary supplements you use. Also tell them if you smoke, drink alcohol, or use illegal drugs. Some items may interact with your medicine. What should I watch for while using this medicine? Tell your doctor or healthcare professional if your symptoms do not start to get better or if they get worse. Your condition will be monitored carefully while you are receiving this medicine. You may need blood work done while you are taking this medicine. What side effects may I notice from receiving this medicine? Side effects that you should report to your doctor or health care professional as soon as possible: -allergic reactions like skin rash, itching or hives, swelling of the face, lips, or tongue -black, tarry stools -bloody or watery diarrhea -changes in vision -chills -cough -depressed mood -eye pain -feeling anxious -fever -general ill feeling or flu-like symptoms -hair loss -loss of appetite -low blood counts - this medicine may decrease the number of white blood cells, red blood cells and platelets. You may be at increased risk for infections and bleeding -pain, tingling, numbness in the hands or feet -redness, blistering, peeling or loosening of the skin, including inside the mouth -red pinpoint spots on skin -signs of decreased platelets or bleeding - bruising, pinpoint red spots on the skin, black, tarry stools, blood in the urine -signs of decreased red blood cells - unusually weak or tired, feeling faint or lightheaded, falls -signs of infection - fever or chills, cough, sore throat, pain or trouble passing urine -signs and symptoms of a dangerous change in heartbeat or heart rhythm like chest pain; dizziness; fast or irregular heartbeat; palpitations; feeling faint or lightheaded, falls; breathing problems -signs  and symptoms of high blood sugar such as dizziness; dry mouth; dry skin; fruity breath; nausea; stomach pain; increased hunger or thirst; increased  urination -signs and symptoms of kidney injury like trouble passing urine or change in the amount of urine -signs and symptoms of liver injury like dark yellow or brown urine; general ill feeling or flu-like symptoms; light-colored stools; loss of appetite; nausea; right upper belly pain; unusually weak or tired; yellowing of the eyes or skin -signs and symptoms of increased potassium like muscle weakness; chest pain; or fast, irregular heartbeat -signs and symptoms of low potassium like muscle cramps or muscle pain; chest pain; dizziness; feeling faint or lightheaded, falls; palpitations; breathing problems; or fast, irregular heartbeat -swelling of the ankles, feet, hands -weight gainSide effects that usually do not require medical attention (report to your doctor or health care professional if they continue or are bothersome): -constipation -general ill feeling or flu-like symptoms -hair loss -loss of appetite -nausea, vomiting This list may not describe all possible side effects. Call your doctor for medical advice about side effects. You may report side effects to FDA at 1-800-FDA-1088. Where should I keep my medicine? This drug is given in a hospital or clinic and will not be stored at home. NOTE: This sheet is a summary. It may not cover all possible information. If you have questions about this medicine, talk to your doctor, pharmacist, or health care provider.  2015, [employer]vier/Gold Standard. (2013-06-21 13:18:19)

## 2014-04-21 NOTE — Telephone Encounter (Signed)
gv adn rpinted appt sched and avs fo rpt for Jan .....sed added tx.....gv pt barium

## 2014-04-21 NOTE — Patient Instructions (Signed)
Continue your increased intake of both food and fluids at home He may take either Robitussin-DM or Delsym cough syrup as needed for your cough Take Ambien 5 mg at bedtime as prescribed for your insomnia Follow-up in 2 weeks with a restaging CT scan of your chest, abdomen and pelvis to reevaluate your disease prior to your next scheduled cycle of immunotherapy

## 2014-04-21 NOTE — Progress Notes (Addendum)
[place] Telephone:(336) [phone]   Fax:(336) [phone]  OFFICE PROGRESS NOTE  [name], MD [place] [address]  DIAGNOSIS: stage IV (T2a, N3, M1b) non-small cell lung cancer, adenocarcinoma presented with large left lower lobe lung mass in addition to bilateral pulmonary nodules and mediastinal and supraclavicular lymphadenopathy diagnosed in May of 2015.  Genomic Alterations Identified? ERBB3 amplification CDK4 amplification IDH2 R172S KRAS G12D MDM2 amplification RBM10 G427f*36 TERC amplification - equivocal? Additional Disease-relevant Genes with No Reportable Alterations Identified? RET ALK BRAF ERBB2 MET EGFR  PRIOR THERAPY:  1) status post [facility]  status post stereotactic radiotherapy to brain lesions under the care of Dr. [facility]atherine Roanat [facility]  11/17/2013 2) Systemic chemotherapy with carboplatin for AUC of 5, Alimta 500 mg/M2 and Avastin 15 mg/KG every 3 weeks, status post 5 cycles. [facility]   CURRENT THERAPY:  1) Immunotherapy with Nivolmab at 3 mg/kg given every 3 weeks. Status post 7 cycles  2) Xgeva 120 mcg subcutaneously every 2 months.  INTERVAL HISTORY: Sherri [id number].[id number]. female returns to the clinic today for follow-up visit accompanied by her significant other. The patient is feeling fine today except for a dry cough that is been present for the past week and a half. She also reports difficulty sleeping. She is unable to tolerate Benadryl as it heights are up as opposed to making her sleepy. She has a history of Gilbert's syndrome. She reports overall feeling stronger. She reports eating and drinking much better and is actually managed to gain a pound. She states that she did not  come in for IV fluids the last 2 time she was scheduled.  She is currently undergoing immunotherapy with Nivolumab, status post 7 cycles and tolerating her treatment fairly well. She was here today for cycle #8. The patient denied having any significant chest pain but continues to have shortness of breath with exertion with no significant cough or hemoptysis. She has no significant weight loss or night sweats.   MEDICAL HISTORY: Past Medical History  Diagnosis Date  . Arthritis   . Bilateral ovarian cysts   . GERD (gastroesophageal reflux disease)   . Strain of hip flexor 06/2013    left side torn  . Cancer     lung ca  . Bone metastases     to left hip and spine  . Radiation     at [facility] to left hip, sacrum and brain  . Radiation 02/10/14-03/06/14    left central chest 35 gray    ALLERGIES:  is allergic to other; ketoconazole; lorazepam; morphine and related; nystatin; sporanox; tylenol; vitamin d; and zofran.  MEDICATIONS:  Current Outpatient Prescriptions  Medication Sig Dispense Refill  . [facility] SENNA PLUS 8.6-50 MG per tablet   11  . docusate sodium (COLACE) 100 MG capsule Take 100 mg by mouth 2 (two) times daily.    .Marland Kitchenenoxaparin (LOVENOX) 100 MG/ML injection ONLY INJECT 0.9 ML SUBCUTANEOUSLY EVERY DAY 30 Syringe 0  . glycerin adult 2 G SUPP Place 1 suppository rectally once as needed for moderate constipation.    .Marland KitchenOLANZapine (ZYPREXA) 10 MG tablet     . albuterol (PROVENTIL HFA;VENTOLIN HFA) 108 (90 BASE) MCG/ACT inhaler Inhale 1-2 puffs into the lungs every 6 (  six) hours as needed for wheezing or shortness of breath. (Patient not taking: Reported on 04/21/2014) 1 Inhaler 2  . bisacodyl (DULCOLAX) 10 MG suppository Place 1 suppository (10 mg total) rectally daily as needed for moderate constipation. (Patient not taking: Reported on 04/21/2014) 10 suppository 0  . calcium carbonate (OS-CAL) 1250 MG chewable tablet Chew 4 tablets by mouth daily.    . codeine 30 MG tablet Take 1 tablet  (30 mg total) by mouth every 4 (four) hours as needed (cough). (Patient not taking: Reported on 04/21/2014) 80 tablet 0  . diphenhydrAMINE (SOMINEX) 25 MG tablet Take 25 mg by mouth at bedtime as needed for sleep. Takes 2 tablets every 3-4 hours during the night.    . dronabinol (MARINOL) 2.5 MG capsule Take 1 capsule (2.5 mg total) by mouth 2 (two) times daily before a meal. (Patient not taking: Reported on 04/21/2014) 60 capsule 0  . esomeprazole (NEXIUM) 40 MG capsule Take 1 capsule (40 mg total) by mouth 2 (two) times daily before a meal. (Patient not taking: Reported on 04/21/2014) 60 capsule 0  . fentaNYL (DURAGESIC - DOSED MCG/HR) 50 MCG/HR Place 1 patch (50 mcg total) onto the skin every 3 (three) days. (Patient not taking: Reported on 04/21/2014) 10 patch 0  . gabapentin (NEURONTIN) 600 MG tablet Take 600 mg by mouth 3 (three) times daily.    Marland Kitchen gabapentin (NEURONTIN) 800 MG tablet     . hydrocortisone 1 % lotion Apply 1 application topically 2 (two) times daily. (Patient not taking: Reported on 04/21/2014) 118 mL 0  . loratadine (CLARITIN) 10 MG tablet Take 1 tablet (10 mg total) by mouth daily. (Patient not taking: Reported on 04/21/2014) 15 tablet 0  . polyethylene glycol (MIRALAX / GLYCOLAX) packet Take 17 g by mouth daily as needed for moderate constipation.    . polyvinyl alcohol (LIQUIFILM TEARS) 1.4 % ophthalmic solution Place 1 drop into both eyes as needed for dry eyes.     . potassium chloride SA (K-DUR,KLOR-CON) 20 MEQ tablet Take 2 tablets (40 mEq total) by mouth daily. For 7 days (Patient not taking: Reported on 04/21/2014) 14 tablet 0  . prochlorperazine (COMPAZINE) 10 MG tablet Take 1 tablet (10 mg total) by mouth every 6 (six) hours as needed for nausea or vomiting. (Patient not taking: Reported on 04/21/2014) 30 tablet 1  . traMADol (ULTRAM) 50 MG tablet TAKE 1 TABLET BY MOUTH EVERY 6 HOURS AS NEEDED (Patient not taking: Reported on 04/21/2014) 60 tablet 0  . Wound Cleansers (RADIAPLEX EX)  Apply topically.    Marland Kitchen zolpidem (AMBIEN) 5 MG tablet Take 1 tablet (5 mg total) by mouth at bedtime as needed for sleep. 30 tablet 0   No current facility-administered medications for this visit.   Facility-Administered Medications Ordered in Other Visits  Medication Dose Route Frequency Provider Last Rate Last Dose  . 0.9 %  sodium chloride infusion   Intravenous Once Curt Bears, MD      . heparin lock flush 100 unit/mL  500 Units Intracatheter Once PRN Curt Bears, MD      . nivolumab (OPDIVO) 160 mg in sodium chloride 0.9 % 100 mL chemo infusion  3 mg/kg (Treatment Plan Actual) Intravenous Once Curt Bears, MD      . sodium chloride 0.9 % injection 10 mL  10 mL Intracatheter PRN Curt Bears, MD   10 mL at 04/07/14 1200  . sodium chloride 0.9 % injection 10 mL  10 mL Intracatheter PRN Curt Bears,  MD        SURGICAL HISTORY:  Past Surgical History  Procedure Laterality Date  . Dilation and curettage of uterus  2004    x 3   . Tubal ligation    . Colonoscopy  04/05/2004    normal     REVIEW OF SYSTEMS:  A comprehensive review of systems was negative except for: Constitutional: positive for fatigue Respiratory: positive for cough Behavioral/Psych: positive for sleep disturbance   PHYSICAL EXAMINATION: General appearance: alert, cooperative, fatigued and no distress Head: Normocephalic, without obvious abnormality, atraumatic Neck: no adenopathy, no JVD, supple, symmetrical, trachea midline and thyroid not enlarged, symmetric, no tenderness/mass/nodules Lymph nodes: Cervical, supraclavicular, and axillary nodes normal. Resp: clear to auscultation bilaterally Back: symmetric, no curvature. ROM normal. No CVA tenderness. Cardio: regular rate and rhythm, S1, S2 normal, no murmur, click, rub or gallop GI: soft, non-tender; bowel sounds normal; no masses,  no organomegaly Extremities: extremities normal, atraumatic, no cyanosis or edema  ECOG PERFORMANCE STATUS: 2  - Symptomatic, <50% confined to bed  Blood pressure 92/67, pulse 116, temperature 97.9 F (36.6 C), resp. rate 16, weight 116 lb 4.8 oz (52.753 kg), last menstrual period 01/14/2008, SpO2 100 %.  LABORATORY DATA: Lab Results  Component Value Date   WBC 4.6 04/21/2014   HGB 9.7* 04/21/2014   HCT 30.7* 04/21/2014   MCV 84.2 04/21/2014   PLT 228 04/21/2014      Chemistry      Component Value Date/Time   NA 139 04/21/2014 1131   NA 141 01/16/2014 0530   K 4.1 04/21/2014 1131   K 3.5* 01/16/2014 0530   CL 106 01/16/2014 0530   CO2 23 04/21/2014 1131   CO2 24 01/16/2014 0530   BUN 14.1 04/21/2014 1131   BUN 7 01/16/2014 0530   CREATININE 0.7 04/21/2014 1131   CREATININE 0.63 01/16/2014 0530   CREATININE 0.83 09/07/2013 1506      Component Value Date/Time   CALCIUM 8.6 04/21/2014 1131   CALCIUM 8.0* 01/16/2014 0530   ALKPHOS 65 04/21/2014 1131   ALKPHOS 66 11/14/2013 1057   AST 16 04/21/2014 1131   AST 12 11/14/2013 1057   ALT 10 04/21/2014 1131   ALT 15 11/14/2013 1057   BILITOT 0.94 04/21/2014 1131   BILITOT 1.4* 11/14/2013 1057       RADIOGRAPHIC STUDIES: No results found.  ASSESSMENT AND PLAN: This is a very pleasant 64 years old white female with metastatic non-small cell lung cancer, adenocarcinoma status post induction chemotherapy with carboplatin and Alimta but unfortunately has evidence for disease progression. She is currently undergoing immunotherapy with Nivolumab status post 7 cycles and tolerating her treatment fairly well. The patient is doing very well with this treatment and she has significant improvement in her symptoms. The patient was discussed with and also seen by Dr. Julien Nordmann. She'll proceed with cycle #8 today as scheduled. We will discontinue scheduled IV fluids and she is encouraged to continue eating and drinking well on her own at home. For the cough she is to try over-the-counter Robitussin-DM or Delsym cough syrup. For her insomnia we will give  her a trial of Ambien 5 mg tablets one by mouth at bedtime as needed for insomnia total of 30 tablets with no refill. She will follow-up in 2 weeks prior to cycle #9 with a restaging CT scan of the chest, abdomen and pelvis with contrast to reevaluate her disease.  She was advised to call immediately if she has any other concerning  symptoms in the interval. The patient voices understanding of current disease status and treatment options and is in agreement with the current care plan.  All questions were answered. The patient knows to call the clinic with any problems, questions or concerns. We can certainly see the patient much sooner if necessary.  Carlton Adam, PA-C 04/21/2014  ADDENDUM: Hematology/Oncology Attending: I had a face to face encounter with the patient. I recommended her care plan. This is a very pleasant 64 years old white female with metastatic non-small cell lung cancer, adenocarcinoma. The patient is currently undergoing immunotherapy with Nivolumab status post 7 cycles and tolerating her treatment fairly well. She does not require any pain medication. She also did not require any IV hydration recently and the patient is able to eat and drink well. I recommended for her to proceed with cycle #8 today as scheduled. For insomnia we'll start the patient on Ambien 5 mg by mouth daily at bedtime. She would come back for follow-up visit in 2 weeks for reevaluation after repeating CT scan of the chest, abdomen and pelvis for restaging of her disease. She was advised to call immediately if she has any concerning symptoms in the interval.  Disclaimer: This note was dictated with voice recognition software. Similar sounding words can inadvertently be transcribed and may not be corrected upon review. Eilleen Kempf., MD 04/23/2014

## 2014-04-26 ENCOUNTER — Ambulatory Visit: Payer: BC Managed Care – PPO

## 2014-04-27 ENCOUNTER — Telehealth: Payer: Self-pay | Admitting: Medical Oncology

## 2014-04-27 NOTE — Telephone Encounter (Signed)
Per Dr Julien Nordmann I instructed Shante to try Melatonin for insomnia.

## 2014-04-28 ENCOUNTER — Ambulatory Visit: Payer: BC Managed Care – PPO

## 2014-05-02 ENCOUNTER — Encounter (HOSPITAL_COMMUNITY): Payer: Self-pay

## 2014-05-02 ENCOUNTER — Ambulatory Visit (HOSPITAL_COMMUNITY)
Admission: RE | Admit: 2014-05-02 | Discharge: 2014-05-02 | Disposition: A | Discharge status: Home / Self Care | Payer: BC Managed Care – PPO | Source: Ambulatory Visit | Attending: Physician Assistant | Admitting: Physician Assistant

## 2014-05-02 DIAGNOSIS — Z9221 Personal history of antineoplastic chemotherapy: Secondary | ICD-10-CM | POA: Insufficient documentation

## 2014-05-02 DIAGNOSIS — Z923 Personal history of irradiation: Secondary | ICD-10-CM | POA: Insufficient documentation

## 2014-05-02 DIAGNOSIS — C3491 Malignant neoplasm of unspecified part of right bronchus or lung: Secondary | ICD-10-CM | POA: Diagnosis present

## 2014-05-02 DIAGNOSIS — R0602 Shortness of breath: Secondary | ICD-10-CM | POA: Insufficient documentation

## 2014-05-02 MED ORDER — IOHEXOL 300 MG/ML  SOLN
100.0000 mL | Freq: Once | INTRAMUSCULAR | Status: AC | PRN
Start: 2014-05-02 — End: 2014-05-02
  Administered 2014-05-02: 100 mL via INTRAVENOUS

## 2014-05-05 ENCOUNTER — Telehealth: Payer: Self-pay | Admitting: *Deleted

## 2014-05-05 ENCOUNTER — Other Ambulatory Visit (HOSPITAL_BASED_OUTPATIENT_CLINIC_OR_DEPARTMENT_OTHER): Payer: BC Managed Care – PPO

## 2014-05-05 ENCOUNTER — Ambulatory Visit: Payer: BC Managed Care – PPO

## 2014-05-05 ENCOUNTER — Encounter: Payer: Self-pay | Admitting: Internal Medicine

## 2014-05-05 ENCOUNTER — Ambulatory Visit (HOSPITAL_BASED_OUTPATIENT_CLINIC_OR_DEPARTMENT_OTHER): Payer: BC Managed Care – PPO

## 2014-05-05 ENCOUNTER — Telehealth: Payer: Self-pay | Admitting: Internal Medicine

## 2014-05-05 ENCOUNTER — Ambulatory Visit (HOSPITAL_BASED_OUTPATIENT_CLINIC_OR_DEPARTMENT_OTHER): Payer: BC Managed Care – PPO | Admitting: Internal Medicine

## 2014-05-05 VITALS — BP 108/59 | HR 107 | Temp 98.4°F | Resp 18 | Ht 63.0 in | Wt 117.8 lb

## 2014-05-05 DIAGNOSIS — C3491 Malignant neoplasm of unspecified part of right bronchus or lung: Secondary | ICD-10-CM

## 2014-05-05 DIAGNOSIS — C3432 Malignant neoplasm of lower lobe, left bronchus or lung: Secondary | ICD-10-CM | POA: Diagnosis not present

## 2014-05-05 DIAGNOSIS — Z5112 Encounter for antineoplastic immunotherapy: Secondary | ICD-10-CM

## 2014-05-05 DIAGNOSIS — C3490 Malignant neoplasm of unspecified part of unspecified bronchus or lung: Secondary | ICD-10-CM

## 2014-05-05 DIAGNOSIS — Z95828 Presence of other vascular implants and grafts: Secondary | ICD-10-CM

## 2014-05-05 DIAGNOSIS — Z79899 Other long term (current) drug therapy: Secondary | ICD-10-CM

## 2014-05-05 LAB — CBC WITH DIFFERENTIAL/PLATELET
BASO%: 0.5 % (ref 0.0–2.0)
Basophils Absolute: 0 10*3/uL (ref 0.0–0.1)
EOS%: 9.1 % — ABNORMAL HIGH (ref 0.0–7.0)
Eosinophils Absolute: 0.3 10*3/uL (ref 0.0–0.5)
HCT: 31.4 % — ABNORMAL LOW (ref 34.8–46.6)
HGB: 9.9 g/dL — ABNORMAL LOW (ref 11.6–15.9)
LYMPH%: 14.2 % (ref 14.0–49.7)
MCH: 27 pg (ref 25.1–34.0)
MCHC: 31.7 g/dL (ref 31.5–36.0)
MCV: 85.3 fL (ref 79.5–101.0)
MONO#: 0.3 10*3/uL (ref 0.1–0.9)
MONO%: 9.1 % (ref 0.0–14.0)
NEUT%: 67.1 % (ref 38.4–76.8)
NEUTROS ABS: 2.5 10*3/uL (ref 1.5–6.5)
Platelets: 242 10*3/uL (ref 145–400)
RBC: 3.68 10*6/uL — ABNORMAL LOW (ref 3.70–5.45)
RDW: 20.5 % — ABNORMAL HIGH (ref 11.2–14.5)
WBC: 3.7 10*3/uL — ABNORMAL LOW (ref 3.9–10.3)
lymph#: 0.5 10*3/uL — ABNORMAL LOW (ref 0.9–3.3)

## 2014-05-05 LAB — COMPREHENSIVE METABOLIC PANEL (CC13)
ALT: 17 U/L (ref 0–55)
ANION GAP: 8 meq/L (ref 3–11)
AST: 16 U/L (ref 5–34)
Albumin: 3.3 g/dL — ABNORMAL LOW (ref 3.5–5.0)
Alkaline Phosphatase: 63 U/L (ref 40–150)
BUN: 18.9 mg/dL (ref 7.0–26.0)
CO2: 24 meq/L (ref 22–29)
CREATININE: 0.7 mg/dL (ref 0.6–1.1)
Calcium: 9.5 mg/dL (ref 8.4–10.4)
Chloride: 107 mEq/L (ref 98–109)
EGFR: 89 mL/min/{1.73_m2} — ABNORMAL LOW (ref >90)
GLUCOSE: 91 mg/dL (ref 70–140)
Potassium: 4.3 mEq/L (ref 3.5–5.1)
SODIUM: 139 meq/L (ref 136–145)
Total Bilirubin: 0.74 mg/dL (ref 0.20–1.20)
Total Protein: 6.6 g/dL (ref 6.4–8.3)

## 2014-05-05 LAB — TSH CHCC: TSH: 3.04 m(IU)/L (ref 0.308–3.960)

## 2014-05-05 MED ORDER — SODIUM CHLORIDE 0.9 % IV SOLN
3.0000 mg/kg | Freq: Once | INTRAVENOUS | Status: AC
  Administered 2014-05-05: 160 mg via INTRAVENOUS
  Filled 2014-05-05: qty 16

## 2014-05-05 MED ORDER — SODIUM CHLORIDE 0.9 % IV SOLN
Freq: Once | INTRAVENOUS | Status: AC
  Administered 2014-05-05: 10:00:00 via INTRAVENOUS

## 2014-05-05 MED ORDER — SODIUM CHLORIDE 0.9 % IJ SOLN
10.0000 mL | INTRAMUSCULAR | Status: DC | PRN
  Administered 2014-05-05: 10 mL via INTRAVENOUS
  Filled 2014-05-05: qty 10

## 2014-05-05 MED ORDER — HEPARIN SOD (PORK) LOCK FLUSH 100 UNIT/ML IV SOLN
500.0000 [IU] | Freq: Once | INTRAVENOUS | Status: AC | PRN
  Administered 2014-05-05: 500 [IU]
  Filled 2014-05-05: qty 5

## 2014-05-05 MED ORDER — SODIUM CHLORIDE 0.9 % IJ SOLN
10.0000 mL | INTRAMUSCULAR | Status: DC | PRN
  Administered 2014-05-05: 10 mL
  Filled 2014-05-05: qty 10

## 2014-05-05 NOTE — Telephone Encounter (Signed)
Pt confirmed labs/ov per 01/21 POF, gave pt AVS.... KJ, sent msg to add chemo

## 2014-05-05 NOTE — Patient Instructions (Signed)
[place] Discharge Instructions for Patients Receiving Chemotherapy  Today you received the following chemotherapy agents Nivolumab.   To help prevent nausea and vomiting after your treatment, we encourage you to take your nausea medication as directed.    If you develop nausea and vomiting that is not controlled by your nausea medication, call the clinic.   BELOW ARE SYMPTOMS THAT SHOULD BE REPORTED IMMEDIATELY:  *FEVER GREATER THAN 100.5 F  *CHILLS WITH OR WITHOUT FEVER  NAUSEA AND VOMITING THAT IS NOT CONTROLLED WITH YOUR NAUSEA MEDICATION  *UNUSUAL SHORTNESS OF BREATH  *UNUSUAL BRUISING OR BLEEDING  TENDERNESS IN MOUTH AND THROAT WITH OR WITHOUT PRESENCE OF ULCERS  *URINARY PROBLEMS  *BOWEL PROBLEMS  UNUSUAL RASH Items with * indicate a potential emergency and should be followed up as soon as possible.  Feel free to call the clinic you have any questions or concerns. The clinic phone number is [phone].

## 2014-05-05 NOTE — Telephone Encounter (Signed)
Per staff message and POF I have scheduled appts. Advised scheduler of appts. JMW  

## 2014-05-05 NOTE — Progress Notes (Signed)
[facility] [phone]   [phone]  OFFICE PROGRESS NOTE  Sherri Downing, MD [place] [address]  DIAGNOSIS: stage IV (T2a, N3, M1b) non-small cell lung cancer, adenocarcinoma presented with large left lower lobe lung mass in addition to bilateral pulmonary nodules and mediastinal and supraclavicular lymphadenopathy diagnosed in May of 2015.  Genomic Alterations Identified? ERBB3 amplification CDK4 amplification IDH2 R172S KRAS G12D MDM2 amplification RBM10 G43f*36 TERC amplification - equivocal? Additional Disease-relevant Genes with No Reportable Alterations Identified? RET ALK BRAF ERBB2 MET EGFR  PRIOR THERAPY:  1) status post palliative radiotherapy to the left [facility]  status post stereotactic radiotherapy to brain lesions under the care of Dr. KKatherine Stafford [place]ul[place]on 11/17/2013 2) Systemic chemotherapy with carboplatin for AUC of 5, Alimta 500 mg/M2 and Avastin 15 mg/KG every 3 weeks, status post 5 cycles. [facility]  CURRENT THERAPY:  1) immunotherapy with Nivolmab at 3 mg/kg given every 3 weeks. Status post 8 cycles  2) Xgeva 120 mcg subcutaneously every 2 months.  INTERVAL HISTORY: Sherri [id number].[id number]. female returns to the clinic today for follow-up visit accompanied by her boyfriend. The patient is feeling much better these days with no significant complaints. She denied having any significant abdominal or back pain. She has no fever or chills, no nausea or vomiting. She denied having any significant chest pain, shortness breath, cough or hemoptysis. She is tolerating her current treatment with immunotherapy with Nivolumab fairly well with no significant adverse effects. She is currently on anticoagulation with  Lovenox and she would like to switch to oral anticoagulation in the near future. She still have 15 doses of Lovenox. She had repeat CT scan of the chest, abdomen and pelvis performed recently and she is here for evaluation and discussion of the scan results.  MEDICAL HISTORY: Past Medical History  Diagnosis Date  . Arthritis   . Bilateral ovarian cysts   . GERD (gastroesophageal reflux disease)   . Strain of hip flexor 06/2013    left side torn  . Cancer     lung ca  . Bone metastases     to left hip and spine  . Radiation     at [facility] to left hip, sacrum and brain  . Radiation 02/10/14-03/06/14    left central chest 35 gray    ALLERGIES:  is allergic to other; ketoconazole; lorazepam; morphine and related; nystatin; sporanox; tylenol; vitamin d; and zofran.  MEDICATIONS:  Current Outpatient Prescriptions  Medication Sig Dispense Refill  . albuterol (PROVENTIL HFA;VENTOLIN HFA) 108 (90 BASE) MCG/ACT inhaler Inhale 1-2 puffs into the lungs every 6 (six) hours as needed for wheezing or shortness of breath. (Patient not taking: Reported on 04/21/2014) 1 Inhaler 2  . bisacodyl (DULCOLAX) 10 MG suppository Place 1 suppository (10 mg total) rectally daily as needed for moderate constipation. (Patient not taking: Reported on 04/21/2014) 10 suppository 0  . calcium carbonate (OS-CAL) 1250 MG chewable tablet Chew 4 tablets by mouth daily.    . codeine 30 MG tablet Take 1 tablet (30 mg total) by mouth every 4 (four) hours as needed (cough). (Patient not taking: Reported on 04/21/2014) 80 tablet 0  . [employer] SENNA PLUS 8.6-50 MG per tablet   11  . diphenhydrAMINE (SOMINEX) 25 MG tablet  Take 25 mg by mouth at bedtime as needed for sleep. Takes 2 tablets every 3-4 hours during the night.    . docusate sodium (COLACE) 100 MG capsule Take 100 mg by mouth 2 (two) times daily.    Marland Kitchen dronabinol (MARINOL) 2.5 MG capsule Take 1 capsule (2.5 mg total) by mouth 2 (two) times daily before a meal. (Patient not taking:  Reported on 04/21/2014) 60 capsule 0  . enoxaparin (LOVENOX) 100 MG/ML injection ONLY INJECT 0.9 ML SUBCUTANEOUSLY EVERY DAY 30 Syringe 0  . esomeprazole (NEXIUM) 40 MG capsule Take 1 capsule (40 mg total) by mouth 2 (two) times daily before a meal. (Patient not taking: Reported on 04/21/2014) 60 capsule 0  . fentaNYL (DURAGESIC - DOSED MCG/HR) 50 MCG/HR Place 1 patch (50 mcg total) onto the skin every 3 (three) days. (Patient not taking: Reported on 04/21/2014) 10 patch 0  . gabapentin (NEURONTIN) 600 MG tablet Take 600 mg by mouth 3 (three) times daily.    Marland Kitchen gabapentin (NEURONTIN) 800 MG tablet     . glycerin adult 2 G SUPP Place 1 suppository rectally once as needed for moderate constipation.    . hydrocortisone 1 % lotion Apply 1 application topically 2 (two) times daily. (Patient not taking: Reported on 04/21/2014) 118 mL 0  . loratadine (CLARITIN) 10 MG tablet Take 1 tablet (10 mg total) by mouth daily. (Patient not taking: Reported on 04/21/2014) 15 tablet 0  . OLANZapine (ZYPREXA) 10 MG tablet     . polyethylene glycol (MIRALAX / GLYCOLAX) packet Take 17 g by mouth daily as needed for moderate constipation.    . polyvinyl alcohol (LIQUIFILM TEARS) 1.4 % ophthalmic solution Place 1 drop into both eyes as needed for dry eyes.     . potassium chloride SA (K-DUR,KLOR-CON) 20 MEQ tablet Take 2 tablets (40 mEq total) by mouth daily. For 7 days (Patient not taking: Reported on 04/21/2014) 14 tablet 0  . prochlorperazine (COMPAZINE) 10 MG tablet Take 1 tablet (10 mg total) by mouth every 6 (six) hours as needed for nausea or vomiting. (Patient not taking: Reported on 04/21/2014) 30 tablet 1  . traMADol (ULTRAM) 50 MG tablet TAKE 1 TABLET BY MOUTH EVERY 6 HOURS AS NEEDED (Patient not taking: Reported on 04/21/2014) 60 tablet 0  . Wound Cleansers (RADIAPLEX EX) Apply topically.    Marland Kitchen zolpidem (AMBIEN) 5 MG tablet Take 1 tablet (5 mg total) by mouth at bedtime as needed for sleep. 30 tablet 0   No current  facility-administered medications for this visit.   Facility-Administered Medications Ordered in Other Visits  Medication Dose Route Frequency Provider Last Rate Last Dose  . sodium chloride 0.9 % injection 10 mL  10 mL Intracatheter PRN Curt Bears, MD   10 mL at 04/07/14 1200    SURGICAL HISTORY:  Past Surgical History  Procedure Laterality Date  . Dilation and curettage of uterus  2004    x 3   . Tubal ligation    . Colonoscopy  04/05/2004    normal     REVIEW OF SYSTEMS:  Constitutional: negative Eyes: negative Ears, nose, mouth, throat, and face: negative Respiratory: negative Cardiovascular: negative Gastrointestinal: negative Genitourinary:negative Integument/breast: negative Hematologic/lymphatic: negative Musculoskeletal:negative Neurological: negative Behavioral/Psych: negative Endocrine: negative Allergic/Immunologic: negative   PHYSICAL EXAMINATION: General appearance: alert, cooperative, fatigued and no distress Head: Normocephalic, without obvious abnormality, atraumatic Neck: no adenopathy, no JVD, supple, symmetrical, trachea midline and thyroid not enlarged, symmetric, no tenderness/mass/nodules Lymph nodes: Cervical, supraclavicular, and axillary  nodes normal. Resp: clear to auscultation bilaterally Back: symmetric, no curvature. ROM normal. No CVA tenderness. Cardio: regular rate and rhythm, S1, S2 normal, no murmur, click, rub or gallop GI: soft, non-tender; bowel sounds normal; no masses,  no organomegaly Extremities: extremities normal, atraumatic, no cyanosis or edema  ECOG PERFORMANCE STATUS: 1 - Symptomatic but completely ambulatory  Blood pressure 108/59, pulse 107, temperature 98.4 F (36.9 C), temperature source Oral, resp. rate 18, height _0  (1.6 m), weight 117 lb 12.8 oz (53.434 kg), last menstrual period 01/14/2008, SpO2 100 %.  LABORATORY DATA: Lab Results  Component Value Date   WBC 3.7* 05/05/2014   HGB 9.9* 05/05/2014   HCT  31.4* 05/05/2014   MCV 85.3 05/05/2014   PLT 242 05/05/2014      Chemistry      Component Value Date/Time   NA 139 04/21/2014 1131   NA 141 01/16/2014 0530   K 4.1 04/21/2014 1131   K 3.5* 01/16/2014 0530   CL 106 01/16/2014 0530   CO2 23 04/21/2014 1131   CO2 24 01/16/2014 0530   BUN 14.1 04/21/2014 1131   BUN 7 01/16/2014 0530   CREATININE 0.7 04/21/2014 1131   CREATININE 0.63 01/16/2014 0530   CREATININE 0.83 09/07/2013 1506      Component Value Date/Time   CALCIUM 8.6 04/21/2014 1131   CALCIUM 8.0* 01/16/2014 0530   ALKPHOS 65 04/21/2014 1131   ALKPHOS 66 11/14/2013 1057   AST 16 04/21/2014 1131   AST 12 11/14/2013 1057   ALT 10 04/21/2014 1131   ALT 15 11/14/2013 1057   BILITOT 0.94 04/21/2014 1131   BILITOT 1.4* 11/14/2013 1057       RADIOGRAPHIC STUDIES: Ct Chest W Contrast  05/02/2014   CLINICAL DATA:  Non-small-cell lung cancer diagnosed 8 months ago. Chemotherapy and radiation therapy completed. Ongoing immunotherapy. Patient reports shortness of breath. Subsequent encounter.  EXAM: CT CHEST, ABDOMEN, AND PELVIS WITH CONTRAST  TECHNIQUE: Multidetector CT imaging of the chest, abdomen and pelvis was performed following the standard protocol during bolus administration of intravenous contrast.  CONTRAST:  138m OMNIPAQUE IOHEXOL 300 MG/ML  SOLN  COMPARISON:  Prior examinations 03/07/2014 and 12/31/2013.  FINDINGS: CT CHEST FINDINGS  Mediastinum: There has been further improvement in the mediastinal and left hilar lymphadenopathy. Right paratracheal node measures 1.4 cm short axis on image 20 (previously 2.0 cm). Subcarinal node measures 1.8 cm short axis on image 28 (previously 2.0 cm). The thyroid gland, trachea and esophagus demonstrate no significant findings. Right IJ Port-A-Cath tip extends to the SVC right atrial junction. The heart size is stable. The pericardial effusion noted previously has nearly completely resolved.There are no significant vascular findings.   Lungs/Pleura: Small left pleural effusion is similar in volume to the previous study. Again demonstrated are multiple pulmonary nodules bilaterally. Many of these are unchanged, although there are few that have decreased in size. In the left upper lobe, there is a 1.5 x 1.6 cm nodule on image 21 which previously measured 1.7 x 1.6 cm. A previously demonstrated retrosternal right upper lobe nodule measuring up to 1.5 x 0.9 cm previously now measures 1.2 cm maximally on image 23. A previously demonstrated 1.5 x 1.4 cm right lower lobe nodule now measures 1.1 cm maximally on image 37. The largest left lower lobe mass with adjacent airspace disease is slightly improved, measuring 4.0 x 3.4 cm on image 38 (previously 4.0 x 3.9 cm). No definite enlarging nodules identified.  Musculoskeletal/Chest wall: No progressive osseous metastases  demonstrated. A treated lesion involving the left 6 rib near the costovertebral junction appears unchanged. There is a suspected central disc extrusion at T6-7 which appears grossly stable. No definite epidural tumor identified.  CT ABDOMEN AND PELVIS FINDINGS  Hepatobiliary: The metastatic disease posteriorly in the right hepatic lobe demonstrates continued improvement. The residual disease is ill-defined, largest component measuring 1.5 x 1.2 cm on image 50. There is no progressive disease. No evidence of gallstones, gallbladder wall thickening or biliary dilatation.  Pancreas: Unremarkable. No pancreatic ductal dilatation or surrounding inflammatory changes.  Spleen: The previously demonstrated small splenic lesion has decreased in size, measuring 7 mm on image 60. The spleen otherwise appears normal.  Adrenals/Urinary Tract: Both adrenal glands appear normal.The kidneys appear normal without evidence of urinary tract calculus or hydronephrosis. No bladder abnormalities are seen.  Stomach/Bowel: No evidence of bowel wall thickening, distention or surrounding inflammatory change.No  ascites or focal extraluminal fluid collection.  Vascular/Lymphatic: There are new low-density lymph nodes within the porta hepatis, measuring up to 12 x 17 mm on image 59. No other enlarged abdominal pelvic lymph nodes identified. There is stable mild aortoiliac atherosclerosis.  Reproductive: The uterus and ovaries appear unremarkable. There is no adnexal mass.  Other: There is a new cluster of small nodules within the subcutaneous fat inferior to the umbilicus, possibly related to subcutaneous injections.  Musculoskeletal: Osseous metastatic disease is grossly stable with multiple mixed lytic and sclerotic lesions, largest in the sacrum and left acetabulum.  IMPRESSION: 1. Further improvement in thoracic metastatic disease with decreased lymphadenopathy. Some of the pulmonary nodules have decreased in size, others remaining unchanged. 2. Resolved pericardial effusion. Small left pleural effusion unchanged. 3. Further improvement in hepatic metastatic disease. 4. New low-density lymph nodes within the porta hepatis, likely necrotic. No other evidence of disease progression in the abdomen or pelvis. Small subcutaneous nodules inferior to the umbilicus are presumably related to subcutaneous injections. 5. Grossly stable multifocal osseous metastases.   Electronically Signed   By: Camie Patience M.D.   On: 05/02/2014 09:38   Ct Abdomen Pelvis W Contrast  05/02/2014   CLINICAL DATA:  Non-small-cell lung cancer diagnosed 8 months ago. Chemotherapy and radiation therapy completed. Ongoing immunotherapy. Patient reports shortness of breath. Subsequent encounter.  EXAM: CT CHEST, ABDOMEN, AND PELVIS WITH CONTRAST  TECHNIQUE: Multidetector CT imaging of the chest, abdomen and pelvis was performed following the standard protocol during bolus administration of intravenous contrast.  CONTRAST:  135m OMNIPAQUE IOHEXOL 300 MG/ML  SOLN  COMPARISON:  Prior examinations 03/07/2014 and 12/31/2013.  FINDINGS: CT CHEST FINDINGS   Mediastinum: There has been further improvement in the mediastinal and left hilar lymphadenopathy. Right paratracheal node measures 1.4 cm short axis on image 20 (previously 2.0 cm). Subcarinal node measures 1.8 cm short axis on image 28 (previously 2.0 cm). The thyroid gland, trachea and esophagus demonstrate no significant findings. Right IJ Port-A-Cath tip extends to the SVC right atrial junction. The heart size is stable. The pericardial effusion noted previously has nearly completely resolved.There are no significant vascular findings.  Lungs/Pleura: Small left pleural effusion is similar in volume to the previous study. Again demonstrated are multiple pulmonary nodules bilaterally. Many of these are unchanged, although there are few that have decreased in size. In the left upper lobe, there is a 1.5 x 1.6 cm nodule on image 21 which previously measured 1.7 x 1.6 cm. A previously demonstrated retrosternal right upper lobe nodule measuring up to 1.5 x 0.9 cm previously now measures  1.2 cm maximally on image 23. A previously demonstrated 1.5 x 1.4 cm right lower lobe nodule now measures 1.1 cm maximally on image 37. The largest left lower lobe mass with adjacent airspace disease is slightly improved, measuring 4.0 x 3.4 cm on image 38 (previously 4.0 x 3.9 cm). No definite enlarging nodules identified.  Musculoskeletal/Chest wall: No progressive osseous metastases demonstrated. A treated lesion involving the left 6 rib near the costovertebral junction appears unchanged. There is a suspected central disc extrusion at T6-7 which appears grossly stable. No definite epidural tumor identified.  CT ABDOMEN AND PELVIS FINDINGS  Hepatobiliary: The metastatic disease posteriorly in the right hepatic lobe demonstrates continued improvement. The residual disease is ill-defined, largest component measuring 1.5 x 1.2 cm on image 50. There is no progressive disease. No evidence of gallstones, gallbladder wall thickening or  biliary dilatation.  Pancreas: Unremarkable. No pancreatic ductal dilatation or surrounding inflammatory changes.  Spleen: The previously demonstrated small splenic lesion has decreased in size, measuring 7 mm on image 60. The spleen otherwise appears normal.  Adrenals/Urinary Tract: Both adrenal glands appear normal.The kidneys appear normal without evidence of urinary tract calculus or hydronephrosis. No bladder abnormalities are seen.  Stomach/Bowel: No evidence of bowel wall thickening, distention or surrounding inflammatory change.No ascites or focal extraluminal fluid collection.  Vascular/Lymphatic: There are new low-density lymph nodes within the porta hepatis, measuring up to 12 x 17 mm on image 59. No other enlarged abdominal pelvic lymph nodes identified. There is stable mild aortoiliac atherosclerosis.  Reproductive: The uterus and ovaries appear unremarkable. There is no adnexal mass.  Other: There is a new cluster of small nodules within the subcutaneous fat inferior to the umbilicus, possibly related to subcutaneous injections.  Musculoskeletal: Osseous metastatic disease is grossly stable with multiple mixed lytic and sclerotic lesions, largest in the sacrum and left acetabulum.  IMPRESSION: 1. Further improvement in thoracic metastatic disease with decreased lymphadenopathy. Some of the pulmonary nodules have decreased in size, others remaining unchanged. 2. Resolved pericardial effusion. Small left pleural effusion unchanged. 3. Further improvement in hepatic metastatic disease. 4. New low-density lymph nodes within the porta hepatis, likely necrotic. No other evidence of disease progression in the abdomen or pelvis. Small subcutaneous nodules inferior to the umbilicus are presumably related to subcutaneous injections. 5. Grossly stable multifocal osseous metastases.   Electronically Signed   By: Camie Patience M.D.   On: 05/02/2014 09:38   Ct Chest W Contrast  05/02/2014   CLINICAL DATA:   Non-small-cell lung cancer diagnosed 8 months ago. Chemotherapy and radiation therapy completed. Ongoing immunotherapy. Patient reports shortness of breath. Subsequent encounter.  EXAM: CT CHEST, ABDOMEN, AND PELVIS WITH CONTRAST  TECHNIQUE: Multidetector CT imaging of the chest, abdomen and pelvis was performed following the standard protocol during bolus administration of intravenous contrast.  CONTRAST:  137m OMNIPAQUE IOHEXOL 300 MG/ML  SOLN  COMPARISON:  Prior examinations 03/07/2014 and 12/31/2013.  FINDINGS: CT CHEST FINDINGS  Mediastinum: There has been further improvement in the mediastinal and left hilar lymphadenopathy. Right paratracheal node measures 1.4 cm short axis on image 20 (previously 2.0 cm). Subcarinal node measures 1.8 cm short axis on image 28 (previously 2.0 cm). The thyroid gland, trachea and esophagus demonstrate no significant findings. Right IJ Port-A-Cath tip extends to the SVC right atrial junction. The heart size is stable. The pericardial effusion noted previously has nearly completely resolved.There are no significant vascular findings.  Lungs/Pleura: Small left pleural effusion is similar in volume to the previous study.  Again demonstrated are multiple pulmonary nodules bilaterally. Many of these are unchanged, although there are few that have decreased in size. In the left upper lobe, there is a 1.5 x 1.6 cm nodule on image 21 which previously measured 1.7 x 1.6 cm. A previously demonstrated retrosternal right upper lobe nodule measuring up to 1.5 x 0.9 cm previously now measures 1.2 cm maximally on image 23. A previously demonstrated 1.5 x 1.4 cm right lower lobe nodule now measures 1.1 cm maximally on image 37. The largest left lower lobe mass with adjacent airspace disease is slightly improved, measuring 4.0 x 3.4 cm on image 38 (previously 4.0 x 3.9 cm). No definite enlarging nodules identified.  Musculoskeletal/Chest wall: No progressive osseous metastases demonstrated. A  treated lesion involving the left 6 rib near the costovertebral junction appears unchanged. There is a suspected central disc extrusion at T6-7 which appears grossly stable. No definite epidural tumor identified.  CT ABDOMEN AND PELVIS FINDINGS  Hepatobiliary: The metastatic disease posteriorly in the right hepatic lobe demonstrates continued improvement. The residual disease is ill-defined, largest component measuring 1.5 x 1.2 cm on image 50. There is no progressive disease. No evidence of gallstones, gallbladder wall thickening or biliary dilatation.  Pancreas: Unremarkable. No pancreatic ductal dilatation or surrounding inflammatory changes.  Spleen: The previously demonstrated small splenic lesion has decreased in size, measuring 7 mm on image 60. The spleen otherwise appears normal.  Adrenals/Urinary Tract: Both adrenal glands appear normal.The kidneys appear normal without evidence of urinary tract calculus or hydronephrosis. No bladder abnormalities are seen.  Stomach/Bowel: No evidence of bowel wall thickening, distention or surrounding inflammatory change.No ascites or focal extraluminal fluid collection.  Vascular/Lymphatic: There are new low-density lymph nodes within the porta hepatis, measuring up to 12 x 17 mm on image 59. No other enlarged abdominal pelvic lymph nodes identified. There is stable mild aortoiliac atherosclerosis.  Reproductive: The uterus and ovaries appear unremarkable. There is no adnexal mass.  Other: There is a new cluster of small nodules within the subcutaneous fat inferior to the umbilicus, possibly related to subcutaneous injections.  Musculoskeletal: Osseous metastatic disease is grossly stable with multiple mixed lytic and sclerotic lesions, largest in the sacrum and left acetabulum.  IMPRESSION: 1. Further improvement in thoracic metastatic disease with decreased lymphadenopathy. Some of the pulmonary nodules have decreased in size, others remaining unchanged. 2. Resolved  pericardial effusion. Small left pleural effusion unchanged. 3. Further improvement in hepatic metastatic disease. 4. New low-density lymph nodes within the porta hepatis, likely necrotic. No other evidence of disease progression in the abdomen or pelvis. Small subcutaneous nodules inferior to the umbilicus are presumably related to subcutaneous injections. 5. Grossly stable multifocal osseous metastases.   Electronically Signed   By: Camie Patience M.D.   On: 05/02/2014 09:38   Ct Abdomen Pelvis W Contrast  05/02/2014   CLINICAL DATA:  Non-small-cell lung cancer diagnosed 8 months ago. Chemotherapy and radiation therapy completed. Ongoing immunotherapy. Patient reports shortness of breath. Subsequent encounter.  EXAM: CT CHEST, ABDOMEN, AND PELVIS WITH CONTRAST  TECHNIQUE: Multidetector CT imaging of the chest, abdomen and pelvis was performed following the standard protocol during bolus administration of intravenous contrast.  CONTRAST:  161m OMNIPAQUE IOHEXOL 300 MG/ML  SOLN  COMPARISON:  Prior examinations 03/07/2014 and 12/31/2013.  FINDINGS: CT CHEST FINDINGS  Mediastinum: There has been further improvement in the mediastinal and left hilar lymphadenopathy. Right paratracheal node measures 1.4 cm short axis on image 20 (previously 2.0 cm). Subcarinal node measures 1.8  cm short axis on image 28 (previously 2.0 cm). The thyroid gland, trachea and esophagus demonstrate no significant findings. Right IJ Port-A-Cath tip extends to the SVC right atrial junction. The heart size is stable. The pericardial effusion noted previously has nearly completely resolved.There are no significant vascular findings.  Lungs/Pleura: Small left pleural effusion is similar in volume to the previous study. Again demonstrated are multiple pulmonary nodules bilaterally. Many of these are unchanged, although there are few that have decreased in size. In the left upper lobe, there is a 1.5 x 1.6 cm nodule on image 21 which previously  measured 1.7 x 1.6 cm. A previously demonstrated retrosternal right upper lobe nodule measuring up to 1.5 x 0.9 cm previously now measures 1.2 cm maximally on image 23. A previously demonstrated 1.5 x 1.4 cm right lower lobe nodule now measures 1.1 cm maximally on image 37. The largest left lower lobe mass with adjacent airspace disease is slightly improved, measuring 4.0 x 3.4 cm on image 38 (previously 4.0 x 3.9 cm). No definite enlarging nodules identified.  Musculoskeletal/Chest wall: No progressive osseous metastases demonstrated. A treated lesion involving the left 6 rib near the costovertebral junction appears unchanged. There is a suspected central disc extrusion at T6-7 which appears grossly stable. No definite epidural tumor identified.  CT ABDOMEN AND PELVIS FINDINGS  Hepatobiliary: The metastatic disease posteriorly in the right hepatic lobe demonstrates continued improvement. The residual disease is ill-defined, largest component measuring 1.5 x 1.2 cm on image 50. There is no progressive disease. No evidence of gallstones, gallbladder wall thickening or biliary dilatation.  Pancreas: Unremarkable. No pancreatic ductal dilatation or surrounding inflammatory changes.  Spleen: The previously demonstrated small splenic lesion has decreased in size, measuring 7 mm on image 60. The spleen otherwise appears normal.  Adrenals/Urinary Tract: Both adrenal glands appear normal.The kidneys appear normal without evidence of urinary tract calculus or hydronephrosis. No bladder abnormalities are seen.  Stomach/Bowel: No evidence of bowel wall thickening, distention or surrounding inflammatory change.No ascites or focal extraluminal fluid collection.  Vascular/Lymphatic: There are new low-density lymph nodes within the porta hepatis, measuring up to 12 x 17 mm on image 59. No other enlarged abdominal pelvic lymph nodes identified. There is stable mild aortoiliac atherosclerosis.  Reproductive: The uterus and ovaries  appear unremarkable. There is no adnexal mass.  Other: There is a new cluster of small nodules within the subcutaneous fat inferior to the umbilicus, possibly related to subcutaneous injections.  Musculoskeletal: Osseous metastatic disease is grossly stable with multiple mixed lytic and sclerotic lesions, largest in the sacrum and left acetabulum.  IMPRESSION: 1. Further improvement in thoracic metastatic disease with decreased lymphadenopathy. Some of the pulmonary nodules have decreased in size, others remaining unchanged. 2. Resolved pericardial effusion. Small left pleural effusion unchanged. 3. Further improvement in hepatic metastatic disease. 4. New low-density lymph nodes within the porta hepatis, likely necrotic. No other evidence of disease progression in the abdomen or pelvis. Small subcutaneous nodules inferior to the umbilicus are presumably related to subcutaneous injections. 5. Grossly stable multifocal osseous metastases.   Electronically Signed   By: Camie Patience M.D.   On: 05/02/2014 09:38   ASSESSMENT AND PLAN: This is a very pleasant 64 years old white female with metastatic non-small cell lung cancer, adenocarcinoma status post induction chemotherapy with carboplatin and Alimta but unfortunately has evidence for disease progression. She is currently undergoing immunotherapy with Nivolumab status post 8 cycles and tolerating her treatment fairly well. The patient is doing very  well with this treatment and she continues to have significant improvement in her symptoms. The recent CT scan of the chest, abdomen and pelvis showed further improvement in her disease in the chest and abdomen. I discussed the scan results with the patient and her boyfriend. I recommended for her to continue her current treatment with Nivolumab every 2 weeks as a scheduled. I recommended for her to proceed with cycle #9 today. She would come back for follow-up visit in 2 weeks with the next cycle of her  treatment. For the abdominal and back pain, she will continue on gabapentin and tramadol. For concern about dehydration, the patient will continue with IV normal saline on Tuesday and Thursday weekly. For the skin rash and itching of the back, I started the patient on hydrocortisone 1% lotion. For anticoagulation, she will continue on Lovenox for now but I may consider switching her to Xarelto after completion of the doses of Lovenox that she currently has. She was advised to call immediately if she has any other concerning symptoms in the interval. The patient voices understanding of current disease status and treatment options and is in agreement with the current care plan.  All questions were answered. The patient knows to call the clinic with any problems, questions or concerns. We can certainly see the patient much sooner if necessary.  Disclaimer: This note was dictated with voice recognition software. Similar sounding words can inadvertently be transcribed and may not be corrected upon review.

## 2014-05-05 NOTE — Patient Instructions (Signed)

## 2014-05-11 ENCOUNTER — Telehealth: Payer: Self-pay

## 2014-05-11 ENCOUNTER — Other Ambulatory Visit: Payer: Self-pay | Admitting: Medical Oncology

## 2014-05-11 DIAGNOSIS — C3490 Malignant neoplasm of unspecified part of unspecified bronchus or lung: Secondary | ICD-10-CM

## 2014-05-11 MED ORDER — RIVAROXABAN (XARELTO) VTE STARTER PACK (15 & 20 MG): ORAL_TABLET | ORAL | Status: DC

## 2014-05-11 NOTE — Progress Notes (Signed)
Per Jama Flavors started rx sent to pharmacy and pt notified.

## 2014-05-11 NOTE — Telephone Encounter (Signed)
Pt called stating Dr Julien Nordmann told her he will switch her from enoxaparin to an oral equivalent. She has 3 injections left. She needs the Rx sent to [facility] on [address] near [facility].

## 2014-05-19 ENCOUNTER — Ambulatory Visit (HOSPITAL_BASED_OUTPATIENT_CLINIC_OR_DEPARTMENT_OTHER): Payer: BC Managed Care – PPO

## 2014-05-19 ENCOUNTER — Other Ambulatory Visit (HOSPITAL_BASED_OUTPATIENT_CLINIC_OR_DEPARTMENT_OTHER): Payer: BC Managed Care – PPO

## 2014-05-19 ENCOUNTER — Encounter: Payer: Self-pay | Admitting: Physician Assistant

## 2014-05-19 ENCOUNTER — Ambulatory Visit (HOSPITAL_BASED_OUTPATIENT_CLINIC_OR_DEPARTMENT_OTHER): Payer: BC Managed Care – PPO | Admitting: Physician Assistant

## 2014-05-19 ENCOUNTER — Ambulatory Visit: Payer: BC Managed Care – PPO

## 2014-05-19 VITALS — BP 107/64 | HR 105 | Temp 98.1°F | Resp 18 | Ht 63.0 in | Wt 124.1 lb

## 2014-05-19 DIAGNOSIS — C3432 Malignant neoplasm of lower lobe, left bronchus or lung: Secondary | ICD-10-CM

## 2014-05-19 DIAGNOSIS — Z95828 Presence of other vascular implants and grafts: Secondary | ICD-10-CM

## 2014-05-19 DIAGNOSIS — C7951 Secondary malignant neoplasm of bone: Secondary | ICD-10-CM

## 2014-05-19 DIAGNOSIS — Z5111 Encounter for antineoplastic chemotherapy: Secondary | ICD-10-CM

## 2014-05-19 DIAGNOSIS — C3490 Malignant neoplasm of unspecified part of unspecified bronchus or lung: Secondary | ICD-10-CM

## 2014-05-19 LAB — CBC WITH DIFFERENTIAL/PLATELET
BASO%: 0.6 % (ref 0.0–2.0)
Basophils Absolute: 0 10*3/uL (ref 0.0–0.1)
EOS%: 5.6 % (ref 0.0–7.0)
Eosinophils Absolute: 0.3 10*3/uL (ref 0.0–0.5)
HEMATOCRIT: 32.9 % — AB (ref 34.8–46.6)
HEMOGLOBIN: 10.6 g/dL — AB (ref 11.6–15.9)
LYMPH%: 13.3 % — AB (ref 14.0–49.7)
MCH: 27.8 pg (ref 25.1–34.0)
MCHC: 32.2 g/dL (ref 31.5–36.0)
MCV: 86.3 fL (ref 79.5–101.0)
MONO#: 0.4 10*3/uL (ref 0.1–0.9)
MONO%: 8.9 % (ref 0.0–14.0)
NEUT%: 71.6 % (ref 38.4–76.8)
NEUTROS ABS: 3.4 10*3/uL (ref 1.5–6.5)
PLATELETS: 178 10*3/uL (ref 145–400)
RBC: 3.81 10*6/uL (ref 3.70–5.45)
RDW: 19.5 % — ABNORMAL HIGH (ref 11.2–14.5)
WBC: 4.7 10*3/uL (ref 3.9–10.3)
lymph#: 0.6 10*3/uL — ABNORMAL LOW (ref 0.9–3.3)

## 2014-05-19 LAB — COMPREHENSIVE METABOLIC PANEL (CC13)
ALT: 38 U/L (ref 0–55)
AST: 25 U/L (ref 5–34)
Albumin: 3.4 g/dL — ABNORMAL LOW (ref 3.5–5.0)
Alkaline Phosphatase: 70 U/L (ref 40–150)
Anion Gap: 11 mEq/L (ref 3–11)
BILIRUBIN TOTAL: 0.93 mg/dL (ref 0.20–1.20)
BUN: 23.7 mg/dL (ref 7.0–26.0)
CALCIUM: 9.1 mg/dL (ref 8.4–10.4)
CHLORIDE: 105 meq/L (ref 98–109)
CO2: 24 mEq/L (ref 22–29)
CREATININE: 0.7 mg/dL (ref 0.6–1.1)
EGFR: 89 mL/min/{1.73_m2} — ABNORMAL LOW (ref >90)
GLUCOSE: 93 mg/dL (ref 70–140)
POTASSIUM: 4.3 meq/L (ref 3.5–5.1)
Sodium: 140 mEq/L (ref 136–145)
Total Protein: 6.5 g/dL (ref 6.4–8.3)

## 2014-05-19 MED ORDER — SODIUM CHLORIDE 0.9 % IJ SOLN
10.0000 mL | INTRAMUSCULAR | Status: DC | PRN
  Administered 2014-05-19: 10 mL via INTRAVENOUS
  Filled 2014-05-19: qty 10

## 2014-05-19 MED ORDER — SODIUM CHLORIDE 0.9 % IV SOLN
Freq: Once | INTRAVENOUS | Status: AC
  Administered 2014-05-19: 11:00:00 via INTRAVENOUS

## 2014-05-19 MED ORDER — HEPARIN SOD (PORK) LOCK FLUSH 100 UNIT/ML IV SOLN
500.0000 [IU] | Freq: Once | INTRAVENOUS | Status: AC | PRN
  Administered 2014-05-19: 500 [IU]
  Filled 2014-05-19: qty 5

## 2014-05-19 MED ORDER — SODIUM CHLORIDE 0.9 % IJ SOLN
10.0000 mL | INTRAMUSCULAR | Status: DC | PRN
  Administered 2014-05-19: 10 mL
  Filled 2014-05-19: qty 10

## 2014-05-19 MED ORDER — SODIUM CHLORIDE 0.9 % IV SOLN
3.0000 mg/kg | Freq: Once | INTRAVENOUS | Status: AC
  Administered 2014-05-19: 160 mg via INTRAVENOUS
  Filled 2014-05-19: qty 16

## 2014-05-19 NOTE — Patient Instructions (Signed)
Follow-up in 2 weeks prior to next scheduled cycle of immunotherapy

## 2014-05-19 NOTE — Patient Instructions (Signed)
[facility] Discharge Instructions for Patients Receiving Chemotherapy  Today you received the following chemotherapy agents Nivolumab To help prevent nausea and vomiting after your treatment, we encourage you to take your nausea medication as prescribed.  If you develop nausea and vomiting that is not controlled by your nausea medication, call the clinic.   BELOW ARE SYMPTOMS THAT SHOULD BE REPORTED IMMEDIATELY:  *FEVER GREATER THAN 100.5 F  *CHILLS WITH OR WITHOUT FEVER  NAUSEA AND VOMITING THAT IS NOT CONTROLLED WITH YOUR NAUSEA MEDICATION  *UNUSUAL SHORTNESS OF BREATH  *UNUSUAL BRUISING OR BLEEDING  TENDERNESS IN MOUTH AND THROAT WITH OR WITHOUT PRESENCE OF ULCERS  *URINARY PROBLEMS  *BOWEL PROBLEMS  UNUSUAL RASH Items with * indicate a potential emergency and should be followed up as soon as possible.  Feel free to call the clinic you have any questions or concerns. The clinic phone number is [phone].

## 2014-05-19 NOTE — Patient Instructions (Signed)

## 2014-05-19 NOTE — Progress Notes (Signed)
[place] [phone]   [phone]  OFFICE PROGRESS NOTE  [name], MD [place] [address]  DIAGNOSIS: stage IV (T2a, N3, M1b) non-small cell lung cancer, adenocarcinoma presented with large left lower lobe lung mass in addition to bilateral pulmonary nodules and mediastinal and supraclavicular lymphadenopathy diagnosed in May of 2015.  Genomic Alterations Identified? ERBB3 amplification CDK4 amplification IDH2 R172S KRAS G12D MDM2 amplification RBM10 [id number] TERC amplification - equivocal? Additional Disease-relevant Genes with No Reportable Alterations Identified? RET ALK BRAF ERBB2 MET EGFR  PRIOR THERAPY:  1) status post palliative radiotherapy to the left hip [facility] 06[facility]12 to [facility]30[facility]  status post stereotactic radiotherapy to brain lesions under the care of Dr. KKatherine Roanat [facility] 11/17/2013 2) Systemic chemotherapy with carboplatin for AUC of 5, Alimta 500 mg/M2 and Avastin 15 mg/KG every 3 weeks, status post 5 cycles. [facility]  CURRENT THERAPY:  1) immunotherapy with Nivolmab at 3 mg/kg given every 3 weeks. Status post 9 cycles  2) Xgeva 120 mcg subcutaneously every 2 months.  INTERVAL HISTORY: Sherri RAYBORN64y.o. female returns to the clinic today for follow-up visit accompanied by her boyfriend. The patient is feeling much better these days with no significant complaints. Overall she is doing significantly better. Her appetite is improved and she has gained 7 pounds. She is stronger overall and is progressing with her physical therapy. She denied having any significant abdominal or back pain. She has no fever or chills, no nausea or vomiting. She denied having any significant chest pain, shortness breath, cough or hemoptysis.  She is tolerating her current treatment with immunotherapy with Nivolumab fairly well with no significant adverse effects. She is currently on anticoagulation with Lovenox and she would like to switch to oral anticoagulation in the near future.   MEDICAL HISTORY: Past Medical History  Diagnosis Date  . Arthritis   . Bilateral ovarian cysts   . GERD (gastroesophageal reflux disease)   . Strain of hip flexor 06/2013    left side torn  . Cancer     lung ca  . Bone metastases     to left hip and spine  . Radiation     at [facility] to left hip, sacrum and brain  . Radiation 02/10/14-03/06/14    left central chest 35 gray    ALLERGIES:  is allergic to other; ketoconazole; lorazepam; morphine and related; nystatin; sporanox; tylenol; vitamin d; and zofran.  MEDICATIONS:  Current Outpatient Prescriptions  Medication Sig Dispense Refill  . [facility] SENNA PLUS 8.6-50 MG per tablet   11  . docusate sodium (COLACE) 100 MG capsule Take 100 mg by mouth 2 (two) times daily.    . Rivaroxaban (XARELTO STARTER PACK) 15 & 20 MG TBPK Take as directed on package: Start with one 146mtablet by mouth twice a day with food. On Day 22, switch to one 2068mablet once a day with food. 51 each 0  . albuterol (PROVENTIL HFA;VENTOLIN HFA) 108 (90 BASE) MCG/ACT inhaler Inhale 1-2 puffs into the lungs every 6 (six) hours as needed for wheezing or shortness of breath. (Patient not taking: Reported on 04/21/2014) 1 Inhaler 2  . bisacodyl (DULCOLAX) 10 MG suppository Place 1 suppository (10 mg total) rectally daily as needed for moderate constipation. (Patient not taking: Reported  on 05/19/2014) 10 suppository 0  . calcium carbonate (OS-CAL) 1250 MG chewable tablet Chew 4 tablets by mouth daily.    . codeine 30 MG tablet Take 1 tablet (30 mg total) by mouth every 4 (four) hours as needed (cough). (Patient not taking: Reported on 04/21/2014) 80 tablet 0  . diphenhydrAMINE (SOMINEX) 25 MG tablet Take 25 mg by mouth at bedtime as needed  for sleep. Takes 2 tablets every 3-4 hours during the night.    . dronabinol (MARINOL) 2.5 MG capsule Take 1 capsule (2.5 mg total) by mouth 2 (two) times daily before a meal. (Patient not taking: Reported on 04/21/2014) 60 capsule 0  . esomeprazole (NEXIUM) 40 MG capsule Take 1 capsule (40 mg total) by mouth 2 (two) times daily before a meal. (Patient not taking: Reported on 04/21/2014) 60 capsule 0  . fentaNYL (DURAGESIC - DOSED MCG/HR) 50 MCG/HR Place 1 patch (50 mcg total) onto the skin every 3 (three) days. (Patient not taking: Reported on 04/21/2014) 10 patch 0  . gabapentin (NEURONTIN) 600 MG tablet Take 600 mg by mouth 3 (three) times daily.    Marland Kitchen gabapentin (NEURONTIN) 800 MG tablet     . glycerin adult 2 G SUPP Place 1 suppository rectally once as needed for moderate constipation.    . polyethylene glycol (MIRALAX / GLYCOLAX) packet Take 17 g by mouth daily as needed for moderate constipation.    . prochlorperazine (COMPAZINE) 10 MG tablet Take 1 tablet (10 mg total) by mouth every 6 (six) hours as needed for nausea or vomiting. (Patient not taking: Reported on 04/21/2014) 30 tablet 1  . traMADol (ULTRAM) 50 MG tablet TAKE 1 TABLET BY MOUTH EVERY 6 HOURS AS NEEDED (Patient not taking: Reported on 04/21/2014) 60 tablet 0   No current facility-administered medications for this visit.   Facility-Administered Medications Ordered in Other Visits  Medication Dose Route Frequency Provider Last Rate Last Dose  . sodium chloride 0.9 % injection 10 mL  10 mL Intracatheter PRN Curt Bears, MD   10 mL at 04/07/14 1200  . sodium chloride 0.9 % injection 10 mL  10 mL Intravenous PRN Carlton Adam, PA-C   10 mL at 05/19/14 2094  . sodium chloride 0.9 % injection 10 mL  10 mL Intracatheter PRN Curt Bears, MD   10 mL at 05/19/14 1252    SURGICAL HISTORY:  Past Surgical History  Procedure Laterality Date  . Dilation and curettage of uterus  2004    x 3   . Tubal ligation    . Colonoscopy   04/05/2004    normal     REVIEW OF SYSTEMS:  Constitutional: negative Eyes: negative Ears, nose, mouth, throat, and face: negative Respiratory: negative Cardiovascular: negative Gastrointestinal: negative Genitourinary:negative Integument/breast: negative Hematologic/lymphatic: negative Musculoskeletal:negative Neurological: negative Behavioral/Psych: negative Endocrine: negative Allergic/Immunologic: negative   PHYSICAL EXAMINATION: General appearance: alert, cooperative, fatigued and no distress Head: Normocephalic, without obvious abnormality, atraumatic Neck: no adenopathy, no JVD, supple, symmetrical, trachea midline and thyroid not enlarged, symmetric, no tenderness/mass/nodules Lymph nodes: Cervical, supraclavicular, and axillary nodes normal. Resp: clear to auscultation bilaterally Back: symmetric, no curvature. ROM normal. No CVA tenderness. Cardio: regular rate and rhythm, S1, S2 normal, no murmur, click, rub or gallop GI: soft, non-tender; bowel sounds normal; no masses,  no organomegaly Extremities: extremities normal, atraumatic, no cyanosis or edema  ECOG PERFORMANCE STATUS: 1 - Symptomatic but completely ambulatory  Blood pressure 107/64, pulse 105, temperature 98.1 F (36.7 C), temperature source Oral, resp.  rate 18, height _0  (1.6 m), weight 124 lb 1.6 oz (56.291 kg), last menstrual period 01/14/2008, SpO2 100 %.  LABORATORY DATA: Lab Results  Component Value Date   WBC 4.7 05/19/2014   HGB 10.6* 05/19/2014   HCT 32.9* 05/19/2014   MCV 86.3 05/19/2014   PLT 178 05/19/2014      Chemistry      Component Value Date/Time   NA 140 05/19/2014 0904   NA 141 01/16/2014 0530   K 4.3 05/19/2014 0904   K 3.5* 01/16/2014 0530   CL 106 01/16/2014 0530   CO2 24 05/19/2014 0904   CO2 24 01/16/2014 0530   BUN 23.7 05/19/2014 0904   BUN 7 01/16/2014 0530   CREATININE 0.7 05/19/2014 0904   CREATININE 0.63 01/16/2014 0530   CREATININE 0.83 09/07/2013 1506       Component Value Date/Time   CALCIUM 9.1 05/19/2014 0904   CALCIUM 8.0* 01/16/2014 0530   ALKPHOS 70 05/19/2014 0904   ALKPHOS 66 11/14/2013 1057   AST 25 05/19/2014 0904   AST 12 11/14/2013 1057   ALT 38 05/19/2014 0904   ALT 15 11/14/2013 1057   BILITOT 0.93 05/19/2014 0904   BILITOT 1.4* 11/14/2013 1057       RADIOGRAPHIC STUDIES: Ct Chest W Contrast  05/02/2014   CLINICAL DATA:  Non-small-cell lung cancer diagnosed 8 months ago. Chemotherapy and radiation therapy completed. Ongoing immunotherapy. Patient reports shortness of breath. Subsequent encounter.  EXAM: CT CHEST, ABDOMEN, AND PELVIS WITH CONTRAST  TECHNIQUE: Multidetector CT imaging of the chest, abdomen and pelvis was performed following the standard protocol during bolus administration of intravenous contrast.  CONTRAST:  171m OMNIPAQUE IOHEXOL 300 MG/ML  SOLN  COMPARISON:  Prior examinations 03/07/2014 and 12/31/2013.  FINDINGS: CT CHEST FINDINGS  Mediastinum: There has been further improvement in the mediastinal and left hilar lymphadenopathy. Right paratracheal node measures 1.4 cm short axis on image 20 (previously 2.0 cm). Subcarinal node measures 1.8 cm short axis on image 28 (previously 2.0 cm). The thyroid gland, trachea and esophagus demonstrate no significant findings. Right IJ Port-A-Cath tip extends to the SVC right atrial junction. The heart size is stable. The pericardial effusion noted previously has nearly completely resolved.There are no significant vascular findings.  Lungs/Pleura: Small left pleural effusion is similar in volume to the previous study. Again demonstrated are multiple pulmonary nodules bilaterally. Many of these are unchanged, although there are few that have decreased in size. In the left upper lobe, there is a 1.5 x 1.6 cm nodule on image 21 which previously measured 1.7 x 1.6 cm. A previously demonstrated retrosternal right upper lobe nodule measuring up to 1.5 x 0.9 cm previously now measures  1.2 cm maximally on image 23. A previously demonstrated 1.5 x 1.4 cm right lower lobe nodule now measures 1.1 cm maximally on image 37. The largest left lower lobe mass with adjacent airspace disease is slightly improved, measuring 4.0 x 3.4 cm on image 38 (previously 4.0 x 3.9 cm). No definite enlarging nodules identified.  Musculoskeletal/Chest wall: No progressive osseous metastases demonstrated. A treated lesion involving the left 6 rib near the costovertebral junction appears unchanged. There is a suspected central disc extrusion at T6-7 which appears grossly stable. No definite epidural tumor identified.  CT ABDOMEN AND PELVIS FINDINGS  Hepatobiliary: The metastatic disease posteriorly in the right hepatic lobe demonstrates continued improvement. The residual disease is ill-defined, largest component measuring 1.5 x 1.2 cm on image 50. There is no progressive disease. No  evidence of gallstones, gallbladder wall thickening or biliary dilatation.  Pancreas: Unremarkable. No pancreatic ductal dilatation or surrounding inflammatory changes.  Spleen: The previously demonstrated small splenic lesion has decreased in size, measuring 7 mm on image 60. The spleen otherwise appears normal.  Adrenals/Urinary Tract: Both adrenal glands appear normal.The kidneys appear normal without evidence of urinary tract calculus or hydronephrosis. No bladder abnormalities are seen.  Stomach/Bowel: No evidence of bowel wall thickening, distention or surrounding inflammatory change.No ascites or focal extraluminal fluid collection.  Vascular/Lymphatic: There are new low-density lymph nodes within the porta hepatis, measuring up to 12 x 17 mm on image 59. No other enlarged abdominal pelvic lymph nodes identified. There is stable mild aortoiliac atherosclerosis.  Reproductive: The uterus and ovaries appear unremarkable. There is no adnexal mass.  Other: There is a new cluster of small nodules within the subcutaneous fat inferior to the  umbilicus, possibly related to subcutaneous injections.  Musculoskeletal: Osseous metastatic disease is grossly stable with multiple mixed lytic and sclerotic lesions, largest in the sacrum and left acetabulum.  IMPRESSION: 1. Further improvement in thoracic metastatic disease with decreased lymphadenopathy. Some of the pulmonary nodules have decreased in size, others remaining unchanged. 2. Resolved pericardial effusion. Small left pleural effusion unchanged. 3. Further improvement in hepatic metastatic disease. 4. New low-density lymph nodes within the porta hepatis, likely necrotic. No other evidence of disease progression in the abdomen or pelvis. Small subcutaneous nodules inferior to the umbilicus are presumably related to subcutaneous injections. 5. Grossly stable multifocal osseous metastases.   Electronically Signed   By: Camie Patience M.D.   On: 05/02/2014 09:38   Ct Abdomen Pelvis W Contrast  05/02/2014   CLINICAL DATA:  Non-small-cell lung cancer diagnosed 8 months ago. Chemotherapy and radiation therapy completed. Ongoing immunotherapy. Patient reports shortness of breath. Subsequent encounter.  EXAM: CT CHEST, ABDOMEN, AND PELVIS WITH CONTRAST  TECHNIQUE: Multidetector CT imaging of the chest, abdomen and pelvis was performed following the standard protocol during bolus administration of intravenous contrast.  CONTRAST:  17m OMNIPAQUE IOHEXOL 300 MG/ML  SOLN  COMPARISON:  Prior examinations 03/07/2014 and 12/31/2013.  FINDINGS: CT CHEST FINDINGS  Mediastinum: There has been further improvement in the mediastinal and left hilar lymphadenopathy. Right paratracheal node measures 1.4 cm short axis on image 20 (previously 2.0 cm). Subcarinal node measures 1.8 cm short axis on image 28 (previously 2.0 cm). The thyroid gland, trachea and esophagus demonstrate no significant findings. Right IJ Port-A-Cath tip extends to the SVC right atrial junction. The heart size is stable. The pericardial effusion noted  previously has nearly completely resolved.There are no significant vascular findings.  Lungs/Pleura: Small left pleural effusion is similar in volume to the previous study. Again demonstrated are multiple pulmonary nodules bilaterally. Many of these are unchanged, although there are few that have decreased in size. In the left upper lobe, there is a 1.5 x 1.6 cm nodule on image 21 which previously measured 1.7 x 1.6 cm. A previously demonstrated retrosternal right upper lobe nodule measuring up to 1.5 x 0.9 cm previously now measures 1.2 cm maximally on image 23. A previously demonstrated 1.5 x 1.4 cm right lower lobe nodule now measures 1.1 cm maximally on image 37. The largest left lower lobe mass with adjacent airspace disease is slightly improved, measuring 4.0 x 3.4 cm on image 38 (previously 4.0 x 3.9 cm). No definite enlarging nodules identified.  Musculoskeletal/Chest wall: No progressive osseous metastases demonstrated. A treated lesion involving the left 6 rib near the  costovertebral junction appears unchanged. There is a suspected central disc extrusion at T6-7 which appears grossly stable. No definite epidural tumor identified.  CT ABDOMEN AND PELVIS FINDINGS  Hepatobiliary: The metastatic disease posteriorly in the right hepatic lobe demonstrates continued improvement. The residual disease is ill-defined, largest component measuring 1.5 x 1.2 cm on image 50. There is no progressive disease. No evidence of gallstones, gallbladder wall thickening or biliary dilatation.  Pancreas: Unremarkable. No pancreatic ductal dilatation or surrounding inflammatory changes.  Spleen: The previously demonstrated small splenic lesion has decreased in size, measuring 7 mm on image 60. The spleen otherwise appears normal.  Adrenals/Urinary Tract: Both adrenal glands appear normal.The kidneys appear normal without evidence of urinary tract calculus or hydronephrosis. No bladder abnormalities are seen.  Stomach/Bowel: No  evidence of bowel wall thickening, distention or surrounding inflammatory change.No ascites or focal extraluminal fluid collection.  Vascular/Lymphatic: There are new low-density lymph nodes within the porta hepatis, measuring up to 12 x 17 mm on image 59. No other enlarged abdominal pelvic lymph nodes identified. There is stable mild aortoiliac atherosclerosis.  Reproductive: The uterus and ovaries appear unremarkable. There is no adnexal mass.  Other: There is a new cluster of small nodules within the subcutaneous fat inferior to the umbilicus, possibly related to subcutaneous injections.  Musculoskeletal: Osseous metastatic disease is grossly stable with multiple mixed lytic and sclerotic lesions, largest in the sacrum and left acetabulum.  IMPRESSION: 1. Further improvement in thoracic metastatic disease with decreased lymphadenopathy. Some of the pulmonary nodules have decreased in size, others remaining unchanged. 2. Resolved pericardial effusion. Small left pleural effusion unchanged. 3. Further improvement in hepatic metastatic disease. 4. New low-density lymph nodes within the porta hepatis, likely necrotic. No other evidence of disease progression in the abdomen or pelvis. Small subcutaneous nodules inferior to the umbilicus are presumably related to subcutaneous injections. 5. Grossly stable multifocal osseous metastases.   Electronically Signed   By: Camie Patience M.D.   On: 05/02/2014 09:38   Ct Chest W Contrast  05/02/2014   CLINICAL DATA:  Non-small-cell lung cancer diagnosed 8 months ago. Chemotherapy and radiation therapy completed. Ongoing immunotherapy. Patient reports shortness of breath. Subsequent encounter.  EXAM: CT CHEST, ABDOMEN, AND PELVIS WITH CONTRAST  TECHNIQUE: Multidetector CT imaging of the chest, abdomen and pelvis was performed following the standard protocol during bolus administration of intravenous contrast.  CONTRAST:  128m OMNIPAQUE IOHEXOL 300 MG/ML  SOLN  COMPARISON:   Prior examinations 03/07/2014 and 12/31/2013.  FINDINGS: CT CHEST FINDINGS  Mediastinum: There has been further improvement in the mediastinal and left hilar lymphadenopathy. Right paratracheal node measures 1.4 cm short axis on image 20 (previously 2.0 cm). Subcarinal node measures 1.8 cm short axis on image 28 (previously 2.0 cm). The thyroid gland, trachea and esophagus demonstrate no significant findings. Right IJ Port-A-Cath tip extends to the SVC right atrial junction. The heart size is stable. The pericardial effusion noted previously has nearly completely resolved.There are no significant vascular findings.  Lungs/Pleura: Small left pleural effusion is similar in volume to the previous study. Again demonstrated are multiple pulmonary nodules bilaterally. Many of these are unchanged, although there are few that have decreased in size. In the left upper lobe, there is a 1.5 x 1.6 cm nodule on image 21 which previously measured 1.7 x 1.6 cm. A previously demonstrated retrosternal right upper lobe nodule measuring up to 1.5 x 0.9 cm previously now measures 1.2 cm maximally on image 23. A previously demonstrated 1.5 x 1.4  cm right lower lobe nodule now measures 1.1 cm maximally on image 37. The largest left lower lobe mass with adjacent airspace disease is slightly improved, measuring 4.0 x 3.4 cm on image 38 (previously 4.0 x 3.9 cm). No definite enlarging nodules identified.  Musculoskeletal/Chest wall: No progressive osseous metastases demonstrated. A treated lesion involving the left 6 rib near the costovertebral junction appears unchanged. There is a suspected central disc extrusion at T6-7 which appears grossly stable. No definite epidural tumor identified.  CT ABDOMEN AND PELVIS FINDINGS  Hepatobiliary: The metastatic disease posteriorly in the right hepatic lobe demonstrates continued improvement. The residual disease is ill-defined, largest component measuring 1.5 x 1.2 cm on image 50. There is no  progressive disease. No evidence of gallstones, gallbladder wall thickening or biliary dilatation.  Pancreas: Unremarkable. No pancreatic ductal dilatation or surrounding inflammatory changes.  Spleen: The previously demonstrated small splenic lesion has decreased in size, measuring 7 mm on image 60. The spleen otherwise appears normal.  Adrenals/Urinary Tract: Both adrenal glands appear normal.The kidneys appear normal without evidence of urinary tract calculus or hydronephrosis. No bladder abnormalities are seen.  Stomach/Bowel: No evidence of bowel wall thickening, distention or surrounding inflammatory change.No ascites or focal extraluminal fluid collection.  Vascular/Lymphatic: There are new low-density lymph nodes within the porta hepatis, measuring up to 12 x 17 mm on image 59. No other enlarged abdominal pelvic lymph nodes identified. There is stable mild aortoiliac atherosclerosis.  Reproductive: The uterus and ovaries appear unremarkable. There is no adnexal mass.  Other: There is a new cluster of small nodules within the subcutaneous fat inferior to the umbilicus, possibly related to subcutaneous injections.  Musculoskeletal: Osseous metastatic disease is grossly stable with multiple mixed lytic and sclerotic lesions, largest in the sacrum and left acetabulum.  IMPRESSION: 1. Further improvement in thoracic metastatic disease with decreased lymphadenopathy. Some of the pulmonary nodules have decreased in size, others remaining unchanged. 2. Resolved pericardial effusion. Small left pleural effusion unchanged. 3. Further improvement in hepatic metastatic disease. 4. New low-density lymph nodes within the porta hepatis, likely necrotic. No other evidence of disease progression in the abdomen or pelvis. Small subcutaneous nodules inferior to the umbilicus are presumably related to subcutaneous injections. 5. Grossly stable multifocal osseous metastases.   Electronically Signed   By: Camie Patience M.D.   On:  05/02/2014 09:38   Ct Abdomen Pelvis W Contrast  05/02/2014   CLINICAL DATA:  Non-small-cell lung cancer diagnosed 8 months ago. Chemotherapy and radiation therapy completed. Ongoing immunotherapy. Patient reports shortness of breath. Subsequent encounter.  EXAM: CT CHEST, ABDOMEN, AND PELVIS WITH CONTRAST  TECHNIQUE: Multidetector CT imaging of the chest, abdomen and pelvis was performed following the standard protocol during bolus administration of intravenous contrast.  CONTRAST:  11m OMNIPAQUE IOHEXOL 300 MG/ML  SOLN  COMPARISON:  Prior examinations 03/07/2014 and 12/31/2013.  FINDINGS: CT CHEST FINDINGS  Mediastinum: There has been further improvement in the mediastinal and left hilar lymphadenopathy. Right paratracheal node measures 1.4 cm short axis on image 20 (previously 2.0 cm). Subcarinal node measures 1.8 cm short axis on image 28 (previously 2.0 cm). The thyroid gland, trachea and esophagus demonstrate no significant findings. Right IJ Port-A-Cath tip extends to the SVC right atrial junction. The heart size is stable. The pericardial effusion noted previously has nearly completely resolved.There are no significant vascular findings.  Lungs/Pleura: Small left pleural effusion is similar in volume to the previous study. Again demonstrated are multiple pulmonary nodules bilaterally. Many of these are  unchanged, although there are few that have decreased in size. In the left upper lobe, there is a 1.5 x 1.6 cm nodule on image 21 which previously measured 1.7 x 1.6 cm. A previously demonstrated retrosternal right upper lobe nodule measuring up to 1.5 x 0.9 cm previously now measures 1.2 cm maximally on image 23. A previously demonstrated 1.5 x 1.4 cm right lower lobe nodule now measures 1.1 cm maximally on image 37. The largest left lower lobe mass with adjacent airspace disease is slightly improved, measuring 4.0 x 3.4 cm on image 38 (previously 4.0 x 3.9 cm). No definite enlarging nodules identified.   Musculoskeletal/Chest wall: No progressive osseous metastases demonstrated. A treated lesion involving the left 6 rib near the costovertebral junction appears unchanged. There is a suspected central disc extrusion at T6-7 which appears grossly stable. No definite epidural tumor identified.  CT ABDOMEN AND PELVIS FINDINGS  Hepatobiliary: The metastatic disease posteriorly in the right hepatic lobe demonstrates continued improvement. The residual disease is ill-defined, largest component measuring 1.5 x 1.2 cm on image 50. There is no progressive disease. No evidence of gallstones, gallbladder wall thickening or biliary dilatation.  Pancreas: Unremarkable. No pancreatic ductal dilatation or surrounding inflammatory changes.  Spleen: The previously demonstrated small splenic lesion has decreased in size, measuring 7 mm on image 60. The spleen otherwise appears normal.  Adrenals/Urinary Tract: Both adrenal glands appear normal.The kidneys appear normal without evidence of urinary tract calculus or hydronephrosis. No bladder abnormalities are seen.  Stomach/Bowel: No evidence of bowel wall thickening, distention or surrounding inflammatory change.No ascites or focal extraluminal fluid collection.  Vascular/Lymphatic: There are new low-density lymph nodes within the porta hepatis, measuring up to 12 x 17 mm on image 59. No other enlarged abdominal pelvic lymph nodes identified. There is stable mild aortoiliac atherosclerosis.  Reproductive: The uterus and ovaries appear unremarkable. There is no adnexal mass.  Other: There is a new cluster of small nodules within the subcutaneous fat inferior to the umbilicus, possibly related to subcutaneous injections.  Musculoskeletal: Osseous metastatic disease is grossly stable with multiple mixed lytic and sclerotic lesions, largest in the sacrum and left acetabulum.  IMPRESSION: 1. Further improvement in thoracic metastatic disease with decreased lymphadenopathy. Some of the  pulmonary nodules have decreased in size, others remaining unchanged. 2. Resolved pericardial effusion. Small left pleural effusion unchanged. 3. Further improvement in hepatic metastatic disease. 4. New low-density lymph nodes within the porta hepatis, likely necrotic. No other evidence of disease progression in the abdomen or pelvis. Small subcutaneous nodules inferior to the umbilicus are presumably related to subcutaneous injections. 5. Grossly stable multifocal osseous metastases.   Electronically Signed   By: Camie Patience M.D.   On: 05/02/2014 09:38   ASSESSMENT AND PLAN: This is a very pleasant 64 years old white female with metastatic non-small cell lung cancer, adenocarcinoma status post induction chemotherapy with carboplatin and Alimta but unfortunately has evidence for disease progression. She is currently undergoing immunotherapy with Nivolumab status post 9 cycles and tolerating her treatment fairly well. The patient is doing very well with this treatment and she continues to have significant improvement in her symptoms. The recent CT scan of the chest, abdomen and pelvis showed further improvement in her disease in the chest and abdomen. The patient was discussed with and also seen by Dr. Julien Nordmann. She will continue with her current treatment with Nivolumab every 2 weeks as a scheduled. She will proceed with cycle #10 today. She she will follow-up in 2  weeks prior to the start of cycle #11.  For the abdominal and back pain, she will continue on gabapentin and tramadol. She has not required IV hydration in a few weeks. For the skin rash and itching of the back, the patient will continue on hydrocortisone 1% lotion. For anticoagulation, she will continue on Lovenox for now but Dr. Julien Nordmann may consider switching her to Xarelto after completion of the doses of Lovenox that she currently has. She was advised to call immediately if she has any other concerning symptoms in the interval. The  patient voices understanding of current disease status and treatment options and is in agreement with the current care plan.  All questions were answered. The patient knows to call the clinic with any problems, questions or concerns. We can certainly see the patient much sooner if necessary.  Carlton Adam, PA-C 05/19/2014  ADDENDUM: Hematology/Oncology Attending: I had a face to face encounter with the patient. I recommended her care plan. This is a very pleasant 64 years old white female with metastatic non-small cell lung cancer, adenocarcinoma who is currently undergoing treatment with immunotherapy with Nivolumab status post 9 cycles. The patient is tolerating her treatment fairly well and she has significant improvement in her general condition. She now comes to the clinic ambulating after being chair we'll pounds for several months. She denied having any significant skin rash or diarrhea. I recommended for her to proceed with cycle #10 today as scheduled. She would come back for follow-up visit in 2 weeks with the next cycle of her treatment. She was advised to call immediately if she has any concerning symptoms in the interval.  Disclaimer: This note was dictated with voice recognition software. Similar sounding words can inadvertently be transcribed and may be missed upon review. Eilleen Kempf., MD 05/21/2014

## 2014-06-02 ENCOUNTER — Telehealth: Payer: Self-pay | Admitting: Physician Assistant

## 2014-06-02 ENCOUNTER — Encounter: Payer: Self-pay | Admitting: Physician Assistant

## 2014-06-02 ENCOUNTER — Ambulatory Visit: Payer: BC Managed Care – PPO

## 2014-06-02 ENCOUNTER — Ambulatory Visit (HOSPITAL_BASED_OUTPATIENT_CLINIC_OR_DEPARTMENT_OTHER): Payer: BC Managed Care – PPO | Admitting: Physician Assistant

## 2014-06-02 ENCOUNTER — Ambulatory Visit (HOSPITAL_BASED_OUTPATIENT_CLINIC_OR_DEPARTMENT_OTHER): Payer: BC Managed Care – PPO

## 2014-06-02 ENCOUNTER — Other Ambulatory Visit (HOSPITAL_BASED_OUTPATIENT_CLINIC_OR_DEPARTMENT_OTHER): Payer: BC Managed Care – PPO

## 2014-06-02 VITALS — BP 126/74 | HR 88 | Temp 97.8°F | Wt 127.3 lb

## 2014-06-02 VITALS — Resp 18 | Ht 63.0 in

## 2014-06-02 DIAGNOSIS — C7931 Secondary malignant neoplasm of brain: Secondary | ICD-10-CM

## 2014-06-02 DIAGNOSIS — C3432 Malignant neoplasm of lower lobe, left bronchus or lung: Secondary | ICD-10-CM

## 2014-06-02 DIAGNOSIS — C770 Secondary and unspecified malignant neoplasm of lymph nodes of head, face and neck: Secondary | ICD-10-CM

## 2014-06-02 DIAGNOSIS — M545 Low back pain: Secondary | ICD-10-CM

## 2014-06-02 DIAGNOSIS — Z5112 Encounter for antineoplastic immunotherapy: Secondary | ICD-10-CM

## 2014-06-02 DIAGNOSIS — C7951 Secondary malignant neoplasm of bone: Secondary | ICD-10-CM

## 2014-06-02 DIAGNOSIS — Z95828 Presence of other vascular implants and grafts: Secondary | ICD-10-CM

## 2014-06-02 DIAGNOSIS — Z79899 Other long term (current) drug therapy: Secondary | ICD-10-CM

## 2014-06-02 DIAGNOSIS — C787 Secondary malignant neoplasm of liver and intrahepatic bile duct: Secondary | ICD-10-CM

## 2014-06-02 DIAGNOSIS — C3490 Malignant neoplasm of unspecified part of unspecified bronchus or lung: Secondary | ICD-10-CM

## 2014-06-02 DIAGNOSIS — R109 Unspecified abdominal pain: Secondary | ICD-10-CM

## 2014-06-02 LAB — CBC WITH DIFFERENTIAL/PLATELET
BASO%: 0.5 % (ref 0.0–2.0)
Basophils Absolute: 0 10*3/uL (ref 0.0–0.1)
EOS%: 10 % — ABNORMAL HIGH (ref 0.0–7.0)
Eosinophils Absolute: 0.6 10*3/uL — ABNORMAL HIGH (ref 0.0–0.5)
HEMATOCRIT: 35.7 % (ref 34.8–46.6)
HEMOGLOBIN: 11.5 g/dL — AB (ref 11.6–15.9)
LYMPH#: 0.7 10*3/uL — AB (ref 0.9–3.3)
LYMPH%: 13.2 % — ABNORMAL LOW (ref 14.0–49.7)
MCH: 28.6 pg (ref 25.1–34.0)
MCHC: 32.2 g/dL (ref 31.5–36.0)
MCV: 88.8 fL (ref 79.5–101.0)
MONO#: 0.5 10*3/uL (ref 0.1–0.9)
MONO%: 8.7 % (ref 0.0–14.0)
NEUT%: 67.6 % (ref 38.4–76.8)
NEUTROS ABS: 3.8 10*3/uL (ref 1.5–6.5)
Platelets: 200 10*3/uL (ref 145–400)
RBC: 4.02 10*6/uL (ref 3.70–5.45)
RDW: 16.7 % — ABNORMAL HIGH (ref 11.2–14.5)
WBC: 5.6 10*3/uL (ref 3.9–10.3)

## 2014-06-02 LAB — COMPREHENSIVE METABOLIC PANEL (CC13)
ALT: 19 U/L (ref 0–55)
ANION GAP: 10 meq/L (ref 3–11)
AST: 19 U/L (ref 5–34)
Albumin: 3.5 g/dL (ref 3.5–5.0)
Alkaline Phosphatase: 69 U/L (ref 40–150)
BILIRUBIN TOTAL: 1.05 mg/dL (ref 0.20–1.20)
BUN: 18.7 mg/dL (ref 7.0–26.0)
CALCIUM: 9.4 mg/dL (ref 8.4–10.4)
CHLORIDE: 106 meq/L (ref 98–109)
CO2: 24 mEq/L (ref 22–29)
CREATININE: 0.8 mg/dL (ref 0.6–1.1)
EGFR: 81 mL/min/{1.73_m2} — ABNORMAL LOW (ref >90)
Glucose: 90 mg/dl (ref 70–140)
Potassium: 4.2 mEq/L (ref 3.5–5.1)
Sodium: 139 mEq/L (ref 136–145)
Total Protein: 6.7 g/dL (ref 6.4–8.3)

## 2014-06-02 MED ORDER — SODIUM CHLORIDE 0.9 % IJ SOLN
10.0000 mL | INTRAMUSCULAR | Status: DC | PRN
  Administered 2014-06-02: 10 mL
  Filled 2014-06-02: qty 10

## 2014-06-02 MED ORDER — SODIUM CHLORIDE 0.9 % IV SOLN
Freq: Once | INTRAVENOUS | Status: AC
  Administered 2014-06-02: 11:00:00 via INTRAVENOUS

## 2014-06-02 MED ORDER — HEPARIN SOD (PORK) LOCK FLUSH 100 UNIT/ML IV SOLN
500.0000 [IU] | Freq: Once | INTRAVENOUS | Status: AC | PRN
Start: 2014-06-02 — End: 2014-06-02
  Administered 2014-06-02: 500 [IU]
  Filled 2014-06-02: qty 5

## 2014-06-02 MED ORDER — SODIUM CHLORIDE 0.9 % IV SOLN
3.0000 mg/kg | Freq: Once | INTRAVENOUS | Status: AC
  Administered 2014-06-02: 160 mg via INTRAVENOUS
  Filled 2014-06-02: qty 16

## 2014-06-02 MED ORDER — SODIUM CHLORIDE 0.9 % IJ SOLN
10.0000 mL | INTRAMUSCULAR | Status: DC | PRN
  Administered 2014-06-02 (×2): 10 mL via INTRAVENOUS
  Filled 2014-06-02: qty 10

## 2014-06-02 NOTE — Patient Instructions (Signed)
[facility] Discharge Instructions for Patients Receiving Chemotherapy  Today you received the following chemotherapy agents Nivolumab.  To help prevent nausea and vomiting after your treatment, we encourage you to take your nausea medication as directed.    If you develop nausea and vomiting that is not controlled by your nausea medication, call the clinic.   BELOW ARE SYMPTOMS THAT SHOULD BE REPORTED IMMEDIATELY:  *FEVER GREATER THAN 100.5 F  *CHILLS WITH OR WITHOUT FEVER  NAUSEA AND VOMITING THAT IS NOT CONTROLLED WITH YOUR NAUSEA MEDICATION  *UNUSUAL SHORTNESS OF BREATH  *UNUSUAL BRUISING OR BLEEDING  TENDERNESS IN MOUTH AND THROAT WITH OR WITHOUT PRESENCE OF ULCERS  *URINARY PROBLEMS  *BOWEL PROBLEMS  UNUSUAL RASH Items with * indicate a potential emergency and should be followed up as soon as possible.  Feel free to call the clinic you have any questions or concerns. The clinic phone number is [phone].

## 2014-06-02 NOTE — Patient Instructions (Signed)

## 2014-06-02 NOTE — Progress Notes (Signed)
[facility] [phone]   [phone]  OFFICE PROGRESS NOTE  [name], MD [place] [address]  DIAGNOSIS: stage IV (T2a, N3, M1b) non-small cell lung cancer, adenocarcinoma presented with large left lower lobe lung mass in addition to bilateral pulmonary nodules and mediastinal and supraclavicular lymphadenopathy diagnosed in May of 2015.  Genomic Alterations Identified? ERBB3 amplification CDK4 amplification IDH2 R172S KRAS G12D MDM2 amplification RBM10 G413f*36 TERC amplification - equivocal? Additional Disease-relevant Genes with No Reportable Alterations Identified? RET ALK BRAF ERBB2 MET EGFR  PRIOR THERAPY:  1) status post palliative [facility] the left [facility]  status post stereotactic [facility] to brain lesions [facility] the [facility] 2) Systemic chemotherapy with carboplatin for AUC of 5, Alimta 500 mg/M2 and Avastin 15 mg/KG every 3 weeks, status post 5 cycles. [facility]  CURRENT THERAPY:  1) immunotherapy with Nivolmab at 3 mg/kg given every 3 weeks. Status post 10 cycles  2) Xgeva 120 mcg subcutaneously every 2 months.  INTERVAL HISTORY: Sherri NEVERS64y.o. female returns to the clinic today for follow-up visit accompanied by her boyfriend. The patient is feeling much better these days with no significant complaints. Overall she is doing significantly better. Her appetite is improved and she has gained another 3 pounds. She continues to be stronger overall and is progressing with her physical therapy. She denied having any significant abdominal or back pain. She has no fever or chills, no nausea or vomiting. She denied having any significant chest pain, shortness  breath, cough or hemoptysis. She is tolerating her current treatment with immunotherapy with Nivolumab fairly well with no significant adverse effects. She denied any diarrhea or skin rashes. She is currently on anticoagulation with Xarelto and requests a refill prescription for 20 mg tablets.  MEDICAL HISTORY: Past Medical History  Diagnosis Date  . Arthritis   . Bilateral ovarian cysts   . GERD (gastroesophageal reflux disease)   . Strain of hip flexor 06/2013    left side torn  . Cancer     lung ca  . Bone metastases     to left hip and spine  . Radiation     at [facility] to left hip, sacrum and brain  . Radiation 02/10/14-03/06/14    left central chest 35 gray    ALLERGIES:  is allergic to other; ketoconazole; lorazepam; morphine and related; nystatin; sporanox; tylenol; vitamin d; and zofran.  MEDICATIONS:  Current Outpatient Prescriptions  Medication Sig Dispense Refill  . albuterol (PROVENTIL HFA;VENTOLIN HFA) 108 (90 BASE) MCG/ACT inhaler Inhale 1-2 puffs into the lungs every 6 (six) hours as needed for wheezing or shortness of breath. 1 Inhaler 2  . bisacodyl (DULCOLAX) 10 MG suppository Place 1 suppository (10 mg total) rectally daily as needed for moderate constipation. 10 suppository 0  . calcium carbonate (OS-CAL) 1250 MG chewable tablet Chew 4 tablets by mouth daily.    . codeine 30 MG tablet Take 1 tablet (30 mg total) by mouth every 4 (four) hours as needed (cough). 80 tablet 0  . [employer] SENNA PLUS 8.6-50 MG per tablet   11  . diphenhydrAMINE (SOMINEX) 25 MG tablet Take 25 mg by mouth at bedtime as needed for sleep. Takes 2 tablets every 3-4 hours  during the night.    . docusate sodium (COLACE) 100 MG capsule Take 100 mg by mouth 2 (two) times daily.    Marland Kitchen dronabinol (MARINOL) 2.5 MG capsule Take 1 capsule (2.5 mg total) by mouth 2 (two) times daily before a meal. 60 capsule 0  . esomeprazole (NEXIUM) 40 MG capsule Take 1 capsule (40 mg total) by mouth 2 (two) times daily  before a meal. 60 capsule 0  . fentaNYL (DURAGESIC - DOSED MCG/HR) 50 MCG/HR Place 1 patch (50 mcg total) onto the skin every 3 (three) days. 10 patch 0  . gabapentin (NEURONTIN) 600 MG tablet Take 600 mg by mouth 3 (three) times daily.    Marland Kitchen gabapentin (NEURONTIN) 800 MG tablet     . glycerin adult 2 G SUPP Place 1 suppository rectally once as needed for moderate constipation.    . polyethylene glycol (MIRALAX / GLYCOLAX) packet Take 17 g by mouth daily as needed for moderate constipation.    . prochlorperazine (COMPAZINE) 10 MG tablet Take 1 tablet (10 mg total) by mouth every 6 (six) hours as needed for nausea or vomiting. 30 tablet 1  . Rivaroxaban (XARELTO STARTER PACK) 15 & 20 MG TBPK Take as directed on package: Start with one 35m tablet by mouth twice a day with food. On Day 22, switch to one 295mtablet once a day with food. 51 each 0  . traMADol (ULTRAM) 50 MG tablet TAKE 1 TABLET BY MOUTH EVERY 6 HOURS AS NEEDED 60 tablet 0   No current facility-administered medications for this visit.   Facility-Administered Medications Ordered in Other Visits  Medication Dose Route Frequency Provider Last Rate Last Dose  . sodium chloride 0.9 % injection 10 mL  10 mL Intracatheter PRN MoCurt BearsMD   10 mL at 04/07/14 1200    SURGICAL HISTORY:  Past Surgical History  Procedure Laterality Date  . Dilation and curettage of uterus  2004    x 3   . Tubal ligation    . Colonoscopy  04/05/2004    normal     REVIEW OF SYSTEMS:  Constitutional: negative Eyes: negative Ears, nose, mouth, throat, and face: negative Respiratory: negative Cardiovascular: negative Gastrointestinal: negative Genitourinary:negative Integument/breast: negative Hematologic/lymphatic: negative Musculoskeletal:negative Neurological: negative Behavioral/Psych: negative Endocrine: negative Allergic/Immunologic: negative   PHYSICAL EXAMINATION: General appearance: alert, cooperative, fatigued and no  distress Head: Normocephalic, without obvious abnormality, atraumatic Neck: no adenopathy, no JVD, supple, symmetrical, trachea midline and thyroid not enlarged, symmetric, no tenderness/mass/nodules Lymph nodes: Cervical, supraclavicular, and axillary nodes normal. Resp: clear to auscultation bilaterally Back: symmetric, no curvature. ROM normal. No CVA tenderness. Cardio: regular rate and rhythm, S1, S2 normal, no murmur, click, rub or gallop GI: soft, non-tender; bowel sounds normal; no masses,  no organomegaly Extremities: extremities normal, atraumatic, no cyanosis or edema  ECOG PERFORMANCE STATUS: 1 - Symptomatic but completely ambulatory  Resp. rate 18, height '5\' 3"'  (1.6 m), last menstrual period 01/14/2008, SpO2 100 %.  LABORATORY DATA: Lab Results  Component Value Date   WBC 5.6 06/02/2014   HGB 11.5* 06/02/2014   HCT 35.7 06/02/2014   MCV 88.8 06/02/2014   PLT 200 06/02/2014      Chemistry      Component Value Date/Time   NA 139 06/02/2014 0915   NA 141 01/16/2014 0530   K 4.2 06/02/2014 0915   K 3.5* 01/16/2014 0530   CL 106 01/16/2014 0530   CO2 24 06/02/2014 0915   CO2 24 01/16/2014 0530  BUN 18.7 06/02/2014 0915   BUN 7 01/16/2014 0530   CREATININE 0.8 06/02/2014 0915   CREATININE 0.63 01/16/2014 0530   CREATININE 0.83 09/07/2013 1506      Component Value Date/Time   CALCIUM 9.4 06/02/2014 0915   CALCIUM 8.0* 01/16/2014 0530   ALKPHOS 69 06/02/2014 0915   ALKPHOS 66 11/14/2013 1057   AST 19 06/02/2014 0915   AST 12 11/14/2013 1057   ALT 19 06/02/2014 0915   ALT 15 11/14/2013 1057   BILITOT 1.05 06/02/2014 0915   BILITOT 1.4* 11/14/2013 1057       RADIOGRAPHIC STUDIES: No results found. No results found. ASSESSMENT AND PLAN: This is a very pleasant 64 years old white female with metastatic non-small cell lung cancer, adenocarcinoma status post induction chemotherapy with carboplatin and Alimta but unfortunately has evidence for disease  progression. She is currently undergoing immunotherapy with Nivolumab status post 9 cycles and tolerating her treatment fairly well. The patient is doing very well with this treatment and she continues to have significant improvement in her symptoms. The recent CT scan of the chest, abdomen and pelvis showed further improvement in her disease in the chest and abdomen. The patient was discussed with and also seen by Dr. Julien Nordmann. She will continue with her current treatment with Nivolumab every 2 weeks as a scheduled. She will proceed with cycle #11 today. She she will follow-up in 2 weeks prior to the start of cycle #12.  For the abdominal and back pain, she will continue on gabapentin and tramadol. She has not required IV hydration in a few weeks. For the skin rash and itching of the back, the patient will continue on hydrocortisone 1% lotion. For anticoagulation, she will continue on Lovenox for now but Dr. Julien Nordmann may consider switching her to Xarelto after completion of the doses of Lovenox that she currently has. She was advised to call immediately if she has any other concerning symptoms in the interval. The patient voices understanding of current disease status and treatment options and is in agreement with the current care plan.  All questions were answered. The patient knows to call the clinic with any problems, questions or concerns. We can certainly see the patient much sooner if necessary.  Carlton Adam, PA-C 06/02/2014  ADDENDUM: Hematology/Oncology Attending: I had a face to face encounter with the patient. I recommended her care plan. This is a very pleasant 64 years old white female with metastatic non-small cell lung cancer, adenocarcinoma who is currently undergoing treatment with immunotherapy with Nivolumab status post 9 cycles. The patient is tolerating her treatment fairly well and she has significant improvement in her general condition. She now comes to the clinic  ambulating after being in a wheelchair for several months. She denied having any significant skin rash or diarrhea. I recommended for her to proceed with cycle #11 today as scheduled. She would come back for follow-up visit in 2 weeks with the next cycle of her treatment. She was advised to call immediately if she has any concerning symptoms in the interval.  Disclaimer: This note was dictated with voice recognition software. Similar sounding words can inadvertently be transcribed and may be missed upon review. Carlton Adam, PA-C 06/02/2014

## 2014-06-02 NOTE — Telephone Encounter (Signed)
Pt confirmed labs/ov per 02/18 POF, gave pt AVS.... KJ, sent msg to add chemo

## 2014-06-03 ENCOUNTER — Telehealth: Payer: Self-pay | Admitting: Internal Medicine

## 2014-06-03 ENCOUNTER — Telehealth: Payer: Self-pay | Admitting: *Deleted

## 2014-06-03 NOTE — Telephone Encounter (Signed)
I have adjsuted 3/3 appt

## 2014-06-03 NOTE — Telephone Encounter (Signed)
returned call and s.w. pt husband and reconfirmed that on the sched printed for pt she has a MD visit on 3.3.16 and at that visit the MD will advise when to make the next visit.

## 2014-06-06 LAB — TSH CHCC: TSH: 2.811 m(IU)/L (ref 0.308–3.960)

## 2014-06-08 MED ORDER — RIVAROXABAN 20 MG PO TABS: 20.0000 mg | ORAL_TABLET | Freq: Every day | ORAL | Status: AC

## 2014-06-08 NOTE — Addendum Note (Signed)
Addended by: Curt Bears on: 06/08/2014 06:03 PM   Modules accepted: Level of Service

## 2014-06-13 ENCOUNTER — Telehealth: Payer: Self-pay | Admitting: *Deleted

## 2014-06-13 NOTE — Telephone Encounter (Signed)
[name] called reporting the MRI at [facility] has been read by Dr. Leonel Ramsay.  He sent the results to Dr. Julien Nordmann.  I want to make sure Dr. Julien Nordmann has accessed the results before my f/u on Thursday.  Will notify Dr. Julien Nordmann.  F/U with Nivolumab scheduled for 06-16-2014 with Awilda Metro PA-C.

## 2014-06-13 NOTE — Telephone Encounter (Signed)
I checked Care everywhere but I did not see any new MRI results.

## 2014-06-14 ENCOUNTER — Telehealth: Payer: Self-pay | Admitting: Medical Oncology

## 2014-06-14 NOTE — Telephone Encounter (Signed)
1715 called Dr. Leonel Ramsay [phone]). Spoke with Sherri Stafford who reports she faxed MRI at 1400 to [phone].  If not received to call her back.  Message left for collaborative with this information.

## 2014-06-14 NOTE — Telephone Encounter (Signed)
I left message for pt that Dr Julien Nordmann does not have MRI results in care everywhere and to f/u with [facility]. I left message in rad onc at [facility] to please call and or fax MRI prior to pt visit on Thursday.

## 2014-06-15 NOTE — Telephone Encounter (Signed)
No new note

## 2014-06-16 ENCOUNTER — Telehealth: Payer: Self-pay | Admitting: Internal Medicine

## 2014-06-16 ENCOUNTER — Ambulatory Visit (HOSPITAL_BASED_OUTPATIENT_CLINIC_OR_DEPARTMENT_OTHER): Payer: BC Managed Care – PPO

## 2014-06-16 ENCOUNTER — Ambulatory Visit: Payer: BC Managed Care – PPO

## 2014-06-16 ENCOUNTER — Other Ambulatory Visit (HOSPITAL_BASED_OUTPATIENT_CLINIC_OR_DEPARTMENT_OTHER): Payer: BC Managed Care – PPO

## 2014-06-16 ENCOUNTER — Encounter: Payer: Self-pay | Admitting: Physician Assistant

## 2014-06-16 ENCOUNTER — Ambulatory Visit (HOSPITAL_BASED_OUTPATIENT_CLINIC_OR_DEPARTMENT_OTHER): Payer: BC Managed Care – PPO | Admitting: Physician Assistant

## 2014-06-16 VITALS — BP 121/64 | HR 104 | Temp 97.9°F

## 2014-06-16 VITALS — Wt 129.4 lb

## 2014-06-16 DIAGNOSIS — C3432 Malignant neoplasm of lower lobe, left bronchus or lung: Secondary | ICD-10-CM

## 2014-06-16 DIAGNOSIS — C3490 Malignant neoplasm of unspecified part of unspecified bronchus or lung: Secondary | ICD-10-CM

## 2014-06-16 DIAGNOSIS — Z95828 Presence of other vascular implants and grafts: Secondary | ICD-10-CM

## 2014-06-16 DIAGNOSIS — Z5112 Encounter for antineoplastic immunotherapy: Secondary | ICD-10-CM

## 2014-06-16 LAB — CBC WITH DIFFERENTIAL/PLATELET
BASO%: 0.2 % (ref 0.0–2.0)
Basophils Absolute: 0 10*3/uL (ref 0.0–0.1)
EOS%: 9.4 % — AB (ref 0.0–7.0)
Eosinophils Absolute: 0.5 10*3/uL (ref 0.0–0.5)
HCT: 34.9 % (ref 34.8–46.6)
HGB: 11.4 g/dL — ABNORMAL LOW (ref 11.6–15.9)
LYMPH%: 12.7 % — AB (ref 14.0–49.7)
MCH: 28.7 pg (ref 25.1–34.0)
MCHC: 32.7 g/dL (ref 31.5–36.0)
MCV: 87.9 fL (ref 79.5–101.0)
MONO#: 0.4 10*3/uL (ref 0.1–0.9)
MONO%: 7.5 % (ref 0.0–14.0)
NEUT#: 3.8 10*3/uL (ref 1.5–6.5)
NEUT%: 70.2 % (ref 38.4–76.8)
PLATELETS: 215 10*3/uL (ref 145–400)
RBC: 3.97 10*6/uL (ref 3.70–5.45)
RDW: 16 % — ABNORMAL HIGH (ref 11.2–14.5)
WBC: 5.3 10*3/uL (ref 3.9–10.3)
lymph#: 0.7 10*3/uL — ABNORMAL LOW (ref 0.9–3.3)

## 2014-06-16 LAB — COMPREHENSIVE METABOLIC PANEL (CC13)
ALK PHOS: 68 U/L (ref 40–150)
ALT: 12 U/L (ref 0–55)
AST: 20 U/L (ref 5–34)
Albumin: 3.4 g/dL — ABNORMAL LOW (ref 3.5–5.0)
Anion Gap: 10 mEq/L (ref 3–11)
BUN: 13.4 mg/dL (ref 7.0–26.0)
CO2: 24 meq/L (ref 22–29)
CREATININE: 0.8 mg/dL (ref 0.6–1.1)
Calcium: 9 mg/dL (ref 8.4–10.4)
Chloride: 107 mEq/L (ref 98–109)
EGFR: 79 mL/min/{1.73_m2} — ABNORMAL LOW (ref >90)
Glucose: 98 mg/dl (ref 70–140)
Potassium: 4.3 mEq/L (ref 3.5–5.1)
SODIUM: 141 meq/L (ref 136–145)
TOTAL PROTEIN: 6.4 g/dL (ref 6.4–8.3)
Total Bilirubin: 0.96 mg/dL (ref 0.20–1.20)

## 2014-06-16 MED ORDER — SODIUM CHLORIDE 0.9 % IV SOLN
Freq: Once | INTRAVENOUS | Status: AC
  Administered 2014-06-16: 10:00:00 via INTRAVENOUS

## 2014-06-16 MED ORDER — SODIUM CHLORIDE 0.9 % IV SOLN
180.0000 mg | Freq: Once | INTRAVENOUS | Status: AC
  Administered 2014-06-16: 180 mg via INTRAVENOUS
  Filled 2014-06-16: qty 18

## 2014-06-16 MED ORDER — SODIUM CHLORIDE 0.9 % IJ SOLN
10.0000 mL | INTRAMUSCULAR | Status: DC | PRN
  Administered 2014-06-16: 10 mL
  Filled 2014-06-16: qty 10

## 2014-06-16 MED ORDER — HEPARIN SOD (PORK) LOCK FLUSH 100 UNIT/ML IV SOLN
500.0000 [IU] | Freq: Once | INTRAVENOUS | Status: AC | PRN
  Administered 2014-06-16: 500 [IU]
  Filled 2014-06-16: qty 5

## 2014-06-16 MED ORDER — SODIUM CHLORIDE 0.9 % IJ SOLN
10.0000 mL | INTRAMUSCULAR | Status: DC | PRN
  Administered 2014-06-16: 10 mL via INTRAVENOUS
  Filled 2014-06-16: qty 10

## 2014-06-16 NOTE — Progress Notes (Signed)
Pt has double port.  Both ports flushed with blood return noted.

## 2014-06-16 NOTE — Telephone Encounter (Signed)
gv and printed appt sched and avs for pt for March...emailed MD and AJ to see if ok to sched with HF

## 2014-06-16 NOTE — Progress Notes (Addendum)
[place] [phone]   [phone]  OFFICE PROGRESS NOTE  [name], MD [place] [address]  DIAGNOSIS: stage IV (T2a, N3, M1b) non-small cell lung cancer, adenocarcinoma presented with large left lower lobe lung mass in addition to bilateral pulmonary nodules and mediastinal and supraclavicular lymphadenopathy diagnosed in May of 2015.  Genomic Alterations Identified? ERBB3 amplification CDK4 amplification IDH2 R172S KRAS G12D MDM2 amplification RBM10 [id number] TERC amplification - equivocal? Additional Disease-relevant Genes with No Reportable Alterations Identified? RET ALK BRAF ERBB2 MET EGFR  PRIOR THERAPY:  1) status post palliative radiotherapy to the left hip between 06/12 to 09/30/2013 at [facility]  status post stereotactic radiotherapy to brain lesions under the care of Dr. KKatherine Roanat DLeonon 11/17/2013 2) Systemic chemotherapy with carboplatin for AUC of 5, Alimta 500 mg/M2 and Avastin 15 mg/KG every 3 weeks, status post 5 cycles. [facility]  CURRENT THERAPY:  1) immunotherapy with Nivolmab at 3 mg/kg given every 3 weeks. Status post 11 cycles  2) Xgeva 120 mcg subcutaneously every 2 months.  INTERVAL HISTORY: Sherri [id number].[id number]. female returns to the clinic today for follow-up visit accompanied by her boyfriend. The patient is feeling much better these days with no significant complaints. Overall she continues to do significantly better. Her appetite is improved and she has gained another 2 pounds. She continues to be stronger overall and is progressing with her physical therapy. She had her last physical therapy session on Tuesday. She reports she can now walk 1 mile with her walker and about a quarter of a mile without her walker. She  denied having any significant abdominal or back pain. She has no fever or chills, no nausea or vomiting. She denied having any significant chest pain, shortness breath, cough or hemoptysis. She is tolerating her current treatment with immunotherapy with Nivolumab fairly well with no significant adverse effects. She denied any diarrhea or skin rashes. She is currently on anticoagulation with Xarelto at 20 mg by mouth daily.   MEDICAL HISTORY: Past Medical History  Diagnosis Date  . Arthritis   . Bilateral ovarian cysts   . GERD (gastroesophageal reflux disease)   . Strain of hip flexor 06/2013    left side torn  . Cancer     lung ca  . Bone metastases     to left hip and spine  . Radiation     at [facility] to left hip, sacrum and brain  . Radiation 02/10/14-03/06/14    left central chest 35 gray    ALLERGIES:  is allergic to other; ketoconazole; lorazepam; morphine and related; nystatin; sporanox; tylenol; vitamin d; and zofran.  MEDICATIONS:  Current Outpatient Prescriptions  Medication Sig Dispense Refill  . esomeprazole (NEXIUM) 40 MG capsule Take 1 capsule (40 mg total) by mouth 2 (two) times daily before a meal. 60 capsule 0  . rivaroxaban (XARELTO) 20 MG TABS tablet Take 1 tablet (20 mg total) by mouth daily with supper. 30 tablet 5  . traMADol (ULTRAM) 50 MG tablet TAKE 1 TABLET BY MOUTH EVERY 6 HOURS AS NEEDED 60 tablet 0  . albuterol (PROVENTIL HFA;VENTOLIN HFA) 108 (90 BASE) MCG/ACT inhaler Inhale 1-2 puffs into the lungs every 6 (six) hours as needed for wheezing or shortness of breath. (Patient not taking: Reported on 06/16/2014) 1  Inhaler 2  . bisacodyl (DULCOLAX) 10 MG suppository Place 1 suppository (10 mg total) rectally daily as needed for moderate constipation. (Patient not taking: Reported on 06/16/2014) 10 suppository 0  . calcium carbonate (OS-CAL) 1250 MG chewable tablet Chew 4 tablets by mouth daily.    . codeine 30 MG tablet Take 1 tablet (30 mg total) by mouth every 4  (four) hours as needed (cough). (Patient not taking: Reported on 06/16/2014) 80 tablet 0  . [employer] SENNA PLUS 8.6-50 MG per tablet   11  . diphenhydrAMINE (SOMINEX) 25 MG tablet Take 25 mg by mouth at bedtime as needed for sleep. Takes 2 tablets every 3-4 hours during the night.    . docusate sodium (COLACE) 100 MG capsule Take 100 mg by mouth 2 (two) times daily.    Marland Kitchen dronabinol (MARINOL) 2.5 MG capsule Take 1 capsule (2.5 mg total) by mouth 2 (two) times daily before a meal. (Patient not taking: Reported on 06/16/2014) 60 capsule 0  . fentaNYL (DURAGESIC - DOSED MCG/HR) 50 MCG/HR Place 1 patch (50 mcg total) onto the skin every 3 (three) days. (Patient not taking: Reported on 06/16/2014) 10 patch 0  . gabapentin (NEURONTIN) 600 MG tablet Take 600 mg by mouth 3 (three) times daily.    Marland Kitchen gabapentin (NEURONTIN) 800 MG tablet     . glycerin adult 2 G SUPP Place 1 suppository rectally once as needed for moderate constipation.    . polyethylene glycol (MIRALAX / GLYCOLAX) packet Take 17 g by mouth daily as needed for moderate constipation.    . prochlorperazine (COMPAZINE) 10 MG tablet Take 1 tablet (10 mg total) by mouth every 6 (six) hours as needed for nausea or vomiting. (Patient not taking: Reported on 06/16/2014) 30 tablet 1   No current facility-administered medications for this visit.   Facility-Administered Medications Ordered in Other Visits  Medication Dose Route Frequency Provider Last Rate Last Dose  . sodium chloride 0.9 % injection 10 mL  10 mL Intracatheter PRN Curt Bears, MD   10 mL at 04/07/14 1200  . sodium chloride 0.9 % injection 10 mL  10 mL Intracatheter PRN Curt Bears, MD   10 mL at 06/16/14 1126    SURGICAL HISTORY:  Past Surgical History  Procedure Laterality Date  . Dilation and curettage of uterus  2004    x 3   . Tubal ligation    . Colonoscopy  04/05/2004    normal     REVIEW OF SYSTEMS:  Constitutional: negative Eyes: negative Ears, nose, mouth, throat, and  face: negative Respiratory: negative Cardiovascular: negative Gastrointestinal: negative Genitourinary:negative Integument/breast: negative Hematologic/lymphatic: negative Musculoskeletal:negative Neurological: negative Behavioral/Psych: negative Endocrine: negative Allergic/Immunologic: negative   PHYSICAL EXAMINATION: General appearance: alert, cooperative, fatigued and no distress Head: Normocephalic, without obvious abnormality, atraumatic Neck: no adenopathy, no JVD, supple, symmetrical, trachea midline and thyroid not enlarged, symmetric, no tenderness/mass/nodules Lymph nodes: Cervical, supraclavicular, and axillary nodes normal. Resp: clear to auscultation bilaterally Back: symmetric, no curvature. ROM normal. No CVA tenderness. Cardio: regular rate and rhythm, S1, S2 normal, no murmur, click, rub or gallop GI: soft, non-tender; bowel sounds normal; no masses,  no organomegaly Extremities: extremities normal, atraumatic, no cyanosis or edema  ECOG PERFORMANCE STATUS: 1 - Symptomatic but completely ambulatory  Weight 129 lb 6.4 oz (58.695 kg), last menstrual period 01/14/2008.  LABORATORY DATA: Lab Results  Component Value Date   WBC 5.3 06/16/2014   HGB 11.4* 06/16/2014   HCT 34.9 06/16/2014   MCV 87.9  06/16/2014   PLT 215 06/16/2014      Chemistry      Component Value Date/Time   NA 141 06/16/2014 0831   NA 141 01/16/2014 0530   K 4.3 06/16/2014 0831   K 3.5* 01/16/2014 0530   CL 106 01/16/2014 0530   CO2 24 06/16/2014 0831   CO2 24 01/16/2014 0530   BUN 13.4 06/16/2014 0831   BUN 7 01/16/2014 0530   CREATININE 0.8 06/16/2014 0831   CREATININE 0.63 01/16/2014 0530   CREATININE 0.83 09/07/2013 1506      Component Value Date/Time   CALCIUM 9.0 06/16/2014 0831   CALCIUM 8.0* 01/16/2014 0530   ALKPHOS 68 06/16/2014 0831   ALKPHOS 66 11/14/2013 1057   AST 20 06/16/2014 0831   AST 12 11/14/2013 1057   ALT 12 06/16/2014 0831   ALT 15 11/14/2013 1057    BILITOT 0.96 06/16/2014 0831   BILITOT 1.4* 11/14/2013 1057       RADIOGRAPHIC STUDIES: No results found.   Patient had an MRI of her brain performed on 06/10/2014 that was read by Dr. Lynden Ang. The study revealed punctate focus of enhancement in the right cerebellum is no longer seen and there is the stable 5 mm enhancing nodule in the left cerebellar hemisphere.  ASSESSMENT AND PLAN: This is a very pleasant 64 years old white female with metastatic non-small cell lung cancer, adenocarcinoma status post induction chemotherapy with carboplatin and Alimta but unfortunately has evidence for disease progression. She is currently undergoing immunotherapy with Nivolumab status post 11 cycles and tolerating her treatment fairly well. The patient is doing very well with this treatment and she continues to have significant improvement in her symptoms. The last CT scan of the chest, abdomen and pelvis showed further improvement in her disease in the chest and abdomen. The patient was discussed with and also seen by Dr. Julien Nordmann. She will continue with her current treatment with Nivolumab every 2 weeks as a scheduled. She will proceed with cycle #12 today. She she will follow-up in 2 weeks prior to the start of cycle #13 with a restaging CT scan of her chest, abdomen and pelvis to reevaluate her disease..  For anticoagulation, she will continue Xarelto. She was advised to call immediately if she has any other concerning symptoms in the interval. The patient voices understanding of current disease status and treatment options and is in agreement with the current care plan.  All questions were answered. The patient knows to call the clinic with any problems, questions or concerns. We can certainly see the patient much sooner if necessary.  Carlton Adam, PA-C 06/16/2014  ADDENDUM: Hematology/Oncology Attending: I had a face to face encounter with the patient. I recommended her care plan.  This is a very pleasant 64 years old white female with metastatic non-small cell lung cancer, adenocarcinoma currently undergoing immunotherapy with Nivolumab status post 11 cycles. The patient is doing fine and tolerating her treatment fairly well with no significant adverse effects. She has significant improvement in her general condition and the patient is now more active and exercises at regular basis. I recommended for the patient to proceed with cycle #12 today as scheduled. She will come back for follow-up visit in 2 weeks after repeating CT scan of the chest, abdomen and pelvis for restaging of her disease. She was advised to call immediately if she has any concerning symptoms in the interval.  Disclaimer: This note was dictated with voice recognition software. Similar sounding words  can inadvertently be transcribed and may be missed upon review. Eilleen Kempf., MD 06/20/2014

## 2014-06-16 NOTE — Patient Instructions (Signed)

## 2014-06-16 NOTE — Patient Instructions (Signed)
[place] Discharge Instructions for Patients Receiving Chemotherapy  Today you received the following chemotherapy agents: Nivolumab.   To help prevent nausea and vomiting after your treatment, we encourage you to take your nausea medication as prescribed.    If you develop nausea and vomiting that is not controlled by your nausea medication, call the clinic.   BELOW ARE SYMPTOMS THAT SHOULD BE REPORTED IMMEDIATELY:  *FEVER GREATER THAN 100.5 F  *CHILLS WITH OR WITHOUT FEVER  NAUSEA AND VOMITING THAT IS NOT CONTROLLED WITH YOUR NAUSEA MEDICATION  *UNUSUAL SHORTNESS OF BREATH  *UNUSUAL BRUISING OR BLEEDING  TENDERNESS IN MOUTH AND THROAT WITH OR WITHOUT PRESENCE OF ULCERS  *URINARY PROBLEMS  *BOWEL PROBLEMS  UNUSUAL RASH Items with * indicate a potential emergency and should be followed up as soon as possible.  Feel free to call the clinic you have any questions or concerns. The clinic phone number is [phone].

## 2014-06-19 NOTE — Patient Instructions (Signed)
Follow-up in 2 weeks with a restaging CT scan of your chest, abdomen and pelvis to reevaluate your disease  

## 2014-06-27 ENCOUNTER — Encounter (HOSPITAL_COMMUNITY): Payer: Self-pay

## 2014-06-27 ENCOUNTER — Telehealth: Payer: Self-pay | Admitting: *Deleted

## 2014-06-27 ENCOUNTER — Ambulatory Visit (HOSPITAL_COMMUNITY)
Admission: RE | Admit: 2014-06-27 | Discharge: 2014-06-27 | Disposition: A | Discharge status: Home / Self Care | Payer: BC Managed Care – PPO | Source: Ambulatory Visit | Attending: Physician Assistant | Admitting: Physician Assistant

## 2014-06-27 ENCOUNTER — Telehealth: Payer: Self-pay | Admitting: Medical Oncology

## 2014-06-27 DIAGNOSIS — Z79899 Other long term (current) drug therapy: Secondary | ICD-10-CM | POA: Diagnosis not present

## 2014-06-27 DIAGNOSIS — C3490 Malignant neoplasm of unspecified part of unspecified bronchus or lung: Secondary | ICD-10-CM | POA: Diagnosis present

## 2014-06-27 MED ORDER — IOHEXOL 300 MG/ML  SOLN
100.0000 mL | Freq: Once | INTRAMUSCULAR | Status: AC | PRN
  Administered 2014-06-27: 100 mL via INTRAVENOUS

## 2014-06-27 NOTE — Telephone Encounter (Signed)
Called received from patient reporting she saw the P.A. for Dr. Alvan Dame last week.  The P.A. is to send xray reports to Dr. Julien Nordmann.  Wanting to ensure these results have been received.  Will notify Dr. Julien Nordmann.  Dr. Alvan Dame added to Care Team list.

## 2014-06-27 NOTE — Telephone Encounter (Signed)
Records from [facility] placed in Mohamed's in basket.

## 2014-06-28 ENCOUNTER — Telehealth: Payer: Self-pay | Admitting: *Deleted

## 2014-06-28 NOTE — Telephone Encounter (Signed)
ON 06/27/14 PT. HAD A SMALL BOWEL MOVEMENT WITH THE USE OF A SUPPOSITORY. SHE HAS NOT BEEN TAKING A STOOL SOFTER ON A DAILY BASIS. ALSO PT.'S FLUID INTAKE IN A 24 HOUR PERIOD WAS 32 OUNCES. ENCOURAGED PT. TO TAKE A STOOL SOFTER ON A DAILY BASIS, TO PUSH FLUIDS TO 64 OUNCES IN A 24 HOUR PERIOD, AND TO TAKE TWO SENNA TABLETS AT BEDTIME TONIGHT. INFORMED PT. THE GOAL IS TO HAVE A GOOD BOWEL MOVEMENT EVERY THREE DAYS. PT. ALSO MENTIONED SHE HAS BEEN NAUSEATED AND VOMITED ON THREE OCCASIONS SINCE 06/23/14 BUT HAS NOT TAKEN HER COMPAZINE. ENCOURAGED PT. TO TAKE THE COMPAZINE AT THE FIRST SIGN OF NAUSEA. PT. VOICES UNDERSTANDING. PT. HAS AN APPOINTMENT ON 06/30/14 TO SEE HEATHER BOELTER,NP. PT. WILL CALL IF THE NEED ARISES.

## 2014-06-30 ENCOUNTER — Ambulatory Visit (HOSPITAL_BASED_OUTPATIENT_CLINIC_OR_DEPARTMENT_OTHER): Payer: BC Managed Care – PPO

## 2014-06-30 ENCOUNTER — Other Ambulatory Visit: Payer: Self-pay | Admitting: *Deleted

## 2014-06-30 ENCOUNTER — Other Ambulatory Visit (HOSPITAL_BASED_OUTPATIENT_CLINIC_OR_DEPARTMENT_OTHER): Payer: BC Managed Care – PPO

## 2014-06-30 ENCOUNTER — Telehealth: Payer: Self-pay | Admitting: Nurse Practitioner

## 2014-06-30 ENCOUNTER — Encounter: Payer: Self-pay | Admitting: Nurse Practitioner

## 2014-06-30 ENCOUNTER — Ambulatory Visit: Payer: BC Managed Care – PPO

## 2014-06-30 ENCOUNTER — Ambulatory Visit (HOSPITAL_BASED_OUTPATIENT_CLINIC_OR_DEPARTMENT_OTHER): Payer: BC Managed Care – PPO | Admitting: Nurse Practitioner

## 2014-06-30 VITALS — BP 118/75 | HR 104 | Temp 97.9°F | Resp 18 | Ht 63.0 in | Wt 128.0 lb

## 2014-06-30 DIAGNOSIS — Z95828 Presence of other vascular implants and grafts: Secondary | ICD-10-CM

## 2014-06-30 DIAGNOSIS — C3432 Malignant neoplasm of lower lobe, left bronchus or lung: Secondary | ICD-10-CM

## 2014-06-30 DIAGNOSIS — C7951 Secondary malignant neoplasm of bone: Secondary | ICD-10-CM

## 2014-06-30 DIAGNOSIS — K5900 Constipation, unspecified: Secondary | ICD-10-CM

## 2014-06-30 DIAGNOSIS — C3490 Malignant neoplasm of unspecified part of unspecified bronchus or lung: Secondary | ICD-10-CM

## 2014-06-30 DIAGNOSIS — G893 Neoplasm related pain (acute) (chronic): Secondary | ICD-10-CM

## 2014-06-30 DIAGNOSIS — Z5112 Encounter for antineoplastic immunotherapy: Secondary | ICD-10-CM

## 2014-06-30 DIAGNOSIS — Z79899 Other long term (current) drug therapy: Secondary | ICD-10-CM

## 2014-06-30 DIAGNOSIS — J90 Pleural effusion, not elsewhere classified: Secondary | ICD-10-CM

## 2014-06-30 LAB — CBC WITH DIFFERENTIAL/PLATELET
BASO%: 0.9 % (ref 0.0–2.0)
Basophils Absolute: 0 10*3/uL (ref 0.0–0.1)
EOS%: 8 % — ABNORMAL HIGH (ref 0.0–7.0)
Eosinophils Absolute: 0.3 10*3/uL (ref 0.0–0.5)
HCT: 34.1 % — ABNORMAL LOW (ref 34.8–46.6)
HGB: 11.2 g/dL — ABNORMAL LOW (ref 11.6–15.9)
LYMPH%: 14 % (ref 14.0–49.7)
MCH: 27.9 pg (ref 25.1–34.0)
MCHC: 32.8 g/dL (ref 31.5–36.0)
MCV: 85.2 fL (ref 79.5–101.0)
MONO#: 0.4 10*3/uL (ref 0.1–0.9)
MONO%: 10.5 % (ref 0.0–14.0)
NEUT#: 2.8 10*3/uL (ref 1.5–6.5)
NEUT%: 66.6 % (ref 38.4–76.8)
Platelets: 202 10*3/uL (ref 145–400)
RBC: 4 10*6/uL (ref 3.70–5.45)
RDW: 15.5 % — ABNORMAL HIGH (ref 11.2–14.5)
WBC: 4.2 10*3/uL (ref 3.9–10.3)
lymph#: 0.6 10*3/uL — ABNORMAL LOW (ref 0.9–3.3)

## 2014-06-30 LAB — COMPREHENSIVE METABOLIC PANEL (CC13)
ALBUMIN: 3.4 g/dL — AB (ref 3.5–5.0)
ALT: 13 U/L (ref 0–55)
AST: 17 U/L (ref 5–34)
Alkaline Phosphatase: 57 U/L (ref 40–150)
Anion Gap: 10 mEq/L (ref 3–11)
BUN: 15.9 mg/dL (ref 7.0–26.0)
CO2: 25 meq/L (ref 22–29)
CREATININE: 0.8 mg/dL (ref 0.6–1.1)
Calcium: 8.7 mg/dL (ref 8.4–10.4)
Chloride: 105 mEq/L (ref 98–109)
EGFR: 81 mL/min/{1.73_m2} — AB (ref >90)
Glucose: 96 mg/dl (ref 70–140)
POTASSIUM: 4.1 meq/L (ref 3.5–5.1)
SODIUM: 139 meq/L (ref 136–145)
Total Bilirubin: 1.36 mg/dL — ABNORMAL HIGH (ref 0.20–1.20)
Total Protein: 6.4 g/dL (ref 6.4–8.3)

## 2014-06-30 LAB — TSH CHCC: TSH: 3.343 m(IU)/L (ref 0.308–3.960)

## 2014-06-30 MED ORDER — SODIUM CHLORIDE 0.9 % IJ SOLN
10.0000 mL | INTRAMUSCULAR | Status: DC | PRN
  Administered 2014-06-30: 10 mL via INTRAVENOUS
  Filled 2014-06-30: qty 10

## 2014-06-30 MED ORDER — SODIUM CHLORIDE 0.9 % IV SOLN
3.0000 mg/kg | Freq: Once | INTRAVENOUS | Status: AC
  Administered 2014-06-30: 180 mg via INTRAVENOUS
  Filled 2014-06-30: qty 18

## 2014-06-30 MED ORDER — SODIUM CHLORIDE 0.9 % IJ SOLN
10.0000 mL | INTRAMUSCULAR | Status: DC | PRN
  Administered 2014-06-30: 10 mL
  Filled 2014-06-30: qty 10

## 2014-06-30 MED ORDER — DENOSUMAB 120 MG/1.7ML ~~LOC~~ SOLN
120.0000 mg | Freq: Once | SUBCUTANEOUS | Status: AC
  Administered 2014-06-30: 120 mg via SUBCUTANEOUS
  Filled 2014-06-30: qty 1.7

## 2014-06-30 MED ORDER — FENTANYL 50 MCG/HR TD PT72: 50.0000 ug | MEDICATED_PATCH | TRANSDERMAL | Status: DC

## 2014-06-30 MED ORDER — SODIUM CHLORIDE 0.9 % IV SOLN
Freq: Once | INTRAVENOUS | Status: AC
  Administered 2014-06-30: 11:00:00 via INTRAVENOUS

## 2014-06-30 MED ORDER — HEPARIN SOD (PORK) LOCK FLUSH 100 UNIT/ML IV SOLN
500.0000 [IU] | Freq: Once | INTRAVENOUS | Status: AC | PRN
  Administered 2014-06-30: 500 [IU]
  Filled 2014-06-30: qty 5

## 2014-06-30 NOTE — Patient Instructions (Signed)
[place] Discharge Instructions for Patients Receiving Chemotherapy  Today you received the following chemotherapy agents xgeva and nivolumab  To help prevent nausea and vomiting after your treatment, we encourage you to take your nausea medication as directed   If you develop nausea and vomiting that is not controlled by your nausea medication, call the clinic.   BELOW ARE SYMPTOMS THAT SHOULD BE REPORTED IMMEDIATELY:  *FEVER GREATER THAN 100.5 F  *CHILLS WITH OR WITHOUT FEVER  NAUSEA AND VOMITING THAT IS NOT CONTROLLED WITH YOUR NAUSEA MEDICATION  *UNUSUAL SHORTNESS OF BREATH  *UNUSUAL BRUISING OR BLEEDING  TENDERNESS IN MOUTH AND THROAT WITH OR WITHOUT PRESENCE OF ULCERS  *URINARY PROBLEMS  *BOWEL PROBLEMS  UNUSUAL RASH Items with * indicate a potential emergency and should be followed up as soon as possible.  Feel free to call the clinic you have any questions or concerns. The clinic phone number is [phone].  Please show the [place] at check to the Emergency Department and triage nurse.

## 2014-06-30 NOTE — Telephone Encounter (Signed)
Appointments made and avs printed for patient °

## 2014-06-30 NOTE — Progress Notes (Addendum)
[place] [phone]   [phone]  OFFICE PROGRESS NOTE  [name], MD [place] [address]  DIAGNOSIS: stage IV (T2a, N3, M1b) non-small cell lung cancer, adenocarcinoma presented with large left lower lobe lung mass in addition to bilateral pulmonary nodules and mediastinal and supraclavicular lymphadenopathy diagnosed in May of 2015.  Genomic Alterations Identified? ERBB3 amplification CDK4 amplification IDH2 R172S KRAS G12D MDM2 amplification RBM10 [id number] TERC amplification - equivocal? Additional Disease-relevant Genes with No Reportable Alterations Identified? RET ALK BRAF ERBB2 MET EGFR  PRIOR THERAPY:  1) status post palliative [facility] to the left [facility]  status [facility] 2) Systemic chemotherapy with carboplatin for AUC of 5, Alimta 500 mg/M2 and Avastin 15 mg/KG every 3 weeks, status post 5 cycles. [facility]  CURRENT THERAPY:  1) immunotherapy with Nivolmab at 3 mg/kg given every 3 weeks. Status post 12 cycles  2) Xgeva 120 mcg subcutaneously every 2 months.  INTERVAL HISTORY: Sherri [id number]. female returns to the clinic today for follow-up visit, accompanied by her boyfriend. The interval history is significant for nausea and vomiting on 3 separate days last week. The patient assumed it was a 72hr bug, and never thought to use any of her home antiemetic medicines. This resolved early on this week and she has not had any nausea since then. She had a repeat CT of the chest, abdomen, and pelvis on 3/14 and is here for the results. She is also due for cycle 13 of nivolumab and her next xgeva injection.  Overall she is tolerating treatments well. She treats her constipation with senna and colace. She has lower back pain, rated 4/10, managed with 552m  fentanyl patches. She will need a refill on this today. She denies shortness of breath, chest pain, cough, or hemoptysis. She walked 1.24m88ms this past Sunday with her walker. For short distances she does not need a walker at all.   MEDICAL HISTORY: Past Medical History  Diagnosis Date  . Arthritis   . Bilateral ovarian cysts   . GERD (gastroesophageal reflux disease)   . Strain of hip flexor 06/2013    left side torn  . Cancer     lung ca  . Bone metastases     to left hip and spine  . Radiation     at [facility] to left hip, sacrum and brain  . Radiation 02/10/14-03/06/14    left central chest 35 gray    ALLERGIES:  is allergic to other; ketoconazole; lorazepam; morphine and related; nystatin; sporanox; tylenol; vitamin d; and zofran.  MEDICATIONS:  Current Outpatient Prescriptions  Medication Sig Dispense Refill  . bisacodyl (DULCOLAX) 10 MG suppository Place 1 suppository (10 mg total) rectally daily as needed for moderate constipation. 10 suppository 0  . [facility] SENNA PLUS 8.6-50 MG per tablet   11  . docusate sodium (COLACE) 100 MG capsule Take 100 mg by mouth 2 (two) times daily.    . eMarland Kitchenomeprazole (NEXIUM) 40 MG capsule Take 1 capsule (40 mg total) by mouth 2 (two) times daily before a meal. 60 capsule 0  . rivaroxaban (XARELTO) 20 MG TABS tablet Take 1 tablet (20 mg total) by mouth daily with  supper. 30 tablet 5  . traMADol (ULTRAM) 50 MG tablet TAKE 1 TABLET BY MOUTH EVERY 6 HOURS AS NEEDED 60 tablet 0  . albuterol (PROVENTIL HFA;VENTOLIN HFA) 108 (90 BASE) MCG/ACT inhaler Inhale 1-2 puffs into the lungs every 6 (six) hours as needed for wheezing or shortness of breath. (Patient not taking: Reported on 06/16/2014) 1 Inhaler 2  . calcium carbonate (OS-CAL) 1250 MG chewable tablet Chew 4 tablets by mouth daily.    . codeine 30 MG tablet  Take 1 tablet (30 mg total) by mouth every 4 (four) hours as needed (cough). (Patient not taking: Reported on 06/16/2014) 80 tablet 0  . diphenhydrAMINE (SOMINEX) 25 MG tablet Take 25 mg by mouth at bedtime as needed for sleep. Takes 2 tablets every 3-4 hours during the night.    . dronabinol (MARINOL) 2.5 MG capsule Take 1 capsule (2.5 mg total) by mouth 2 (two) times daily before a meal. (Patient not taking: Reported on 06/16/2014) 60 capsule 0  . fentaNYL (DURAGESIC - DOSED MCG/HR) 50 MCG/HR Place 1 patch (50 mcg total) onto the skin every 3 (three) days. 10 patch 0  . gabapentin (NEURONTIN) 600 MG tablet Take 600 mg by mouth 3 (three) times daily.    Marland Kitchen gabapentin (NEURONTIN) 800 MG tablet     . glycerin adult 2 G SUPP Place 1 suppository rectally once as needed for moderate constipation.    . polyethylene glycol (MIRALAX / GLYCOLAX) packet Take 17 g by mouth daily as needed for moderate constipation.    . prochlorperazine (COMPAZINE) 10 MG tablet Take 1 tablet (10 mg total) by mouth every 6 (six) hours as needed for nausea or vomiting. (Patient not taking: Reported on 06/16/2014) 30 tablet 1   No current facility-administered medications for this visit.   Facility-Administered Medications Ordered in Other Visits  Medication Dose Route Frequency Provider Last Rate Last Dose  . sodium chloride 0.9 % injection 10 mL  10 mL Intracatheter PRN Curt Bears, MD   10 mL at 04/07/14 1200    SURGICAL HISTORY:  Past Surgical History  Procedure Laterality Date  . Dilation and curettage of uterus  2004    x 3   . Tubal ligation    . Colonoscopy  04/05/2004    normal     REVIEW OF SYSTEMS:  Constitutional: negative Eyes: negative Ears, nose, mouth, throat, and face: negative Respiratory: negative Cardiovascular: negative Gastrointestinal: positive for constipation Genitourinary:negative Integument/breast: negative Hematologic/lymphatic: negative Musculoskeletal:positive for back  pain Neurological: negative Behavioral/Psych: negative Endocrine: negative Allergic/Immunologic: negative   PHYSICAL EXAMINATION: General appearance: alert, cooperative, fatigued and no distress Head: Normocephalic, without obvious abnormality, atraumatic Neck: no adenopathy, no JVD, supple, symmetrical, trachea midline and thyroid not enlarged, symmetric, no tenderness/mass/nodules Lymph nodes: Cervical, supraclavicular, and axillary nodes normal. Resp: clear to auscultation bilaterally Back: symmetric, no curvature. ROM normal. No CVA tenderness. Cardio: regular rate and rhythm, S1, S2 normal, no murmur, click, rub or gallop GI: soft, non-tender; bowel sounds normal; no masses,  no organomegaly Extremities: extremities normal, atraumatic, no cyanosis or edema  ECOG PERFORMANCE STATUS: 1 - Symptomatic but completely ambulatory  Blood pressure 118/75, pulse 104, temperature 97.9 F (36.6 C), temperature source Oral, resp. rate 18, height '5\' 3"'  (1.6 m), weight 128 lb (58.06 kg), last menstrual period 01/14/2008.  LABORATORY DATA: Lab Results  Component Value Date   WBC 4.2 06/30/2014   HGB 11.2* 06/30/2014   HCT 34.1* 06/30/2014   MCV 85.2 06/30/2014   PLT 202 06/30/2014  Chemistry      Component Value Date/Time   NA 139 06/30/2014 0916   NA 141 01/16/2014 0530   K 4.1 06/30/2014 0916   K 3.5* 01/16/2014 0530   CL 106 01/16/2014 0530   CO2 25 06/30/2014 0916   CO2 24 01/16/2014 0530   BUN 15.9 06/30/2014 0916   BUN 7 01/16/2014 0530   CREATININE 0.8 06/30/2014 0916   CREATININE 0.63 01/16/2014 0530   CREATININE 0.83 09/07/2013 1506      Component Value Date/Time   CALCIUM 8.7 06/30/2014 0916   CALCIUM 8.0* 01/16/2014 0530   ALKPHOS 57 06/30/2014 0916   ALKPHOS 66 11/14/2013 1057   AST 17 06/30/2014 0916   AST 12 11/14/2013 1057   ALT 13 06/30/2014 0916   ALT 15 11/14/2013 1057   BILITOT 1.36* 06/30/2014 0916   BILITOT 1.4* 11/14/2013 1057        RADIOGRAPHIC STUDIES: Ct Chest W Contrast  06/27/2014   CLINICAL DATA:  Patient with history of metastatic lung cancer. On chemotherapy.  EXAM: CT CHEST, ABDOMEN, AND PELVIS WITH CONTRAST  TECHNIQUE: Multidetector CT imaging of the chest, abdomen and pelvis was performed following the standard protocol during bolus administration of intravenous contrast.  CONTRAST:  150m OMNIPAQUE IOHEXOL 300 MG/ML  SOLN  COMPARISON:  CT 05/02/2014  FINDINGS: CT CHEST FINDINGS  Mediastinum/Nodes: Grossly stable mediastinal and left hilar lymphadenopathy. 1.3 cm pretracheal lymph node (image 22; series 2), previously measured 1.4 cm. 1.7 cm subcarinal lymph node (image 30; series 2) previously measured 1.8 cm normal heart size. Right anterior chest wall Port-A-Cath with tip terminating in the right atrium. Normal heart size. Trace pericardial fluid.  Lungs/Pleura: Central airways are patent. Interval increase in size of now moderate left pleural effusion. There is interval increase in left parahilar consolidation. Multiple bilateral pulmonary nodules are re- demonstrated and grossly stable when compared to previous examination. Reference nodules are as follows: Left upper lobe nodule measuring 0.9 x 0.8 cm (image 15; series 4), previously 1.0 x 0.9 cm. Left upper lobe nodule measuring 1.5 x 1.6 cm (image 25; series 4), previously 1.5 x 1.6 cm. Left upper lobe nodule measuring 1.2 x 0.8 cm (image 28; series 4), previously 1.2 x 0.8 cm. 2.9 x 2.5 cm left lower lobe nodule (image 34; series 4), previously 2.9 x 2.6 cm. The adjacent left lower lobe mass measuring 3.9 x 3.1 cm (image 37; series 4), previously 3.4 x 4.0 cm. There is adjacent airspace disease. 1.8 x 1.0 cm right upper lobe nodule (image 30; series 4), previously 1.7 x 0.9 cm  Musculoskeletal: Unchanged left posterior sixth rib metastasis. No progressive osseous metastatic disease.  CT ABDOMEN AND PELVIS FINDINGS  Hepatobiliary: Liver is normal in size and  contour. No significant interval change in hepatic metastatic disease including a 1.4 x 1.3 cm lesion within the posterior right hepatic lobe (image 52; series 2, previously measuring 1.5 x 1.2 cm. No definite new hepatic lesions are identified. Gallbladder is unremarkable.  Pancreas: Unremarkable  Spleen: Interval increase in size of now 1.3 cm low-attenuation mass within the inferior aspect of the spleen, previously measuring 0.6 cm (image 64; series 2).  Adrenals/Urinary Tract: Normal bilateral adrenal glands. The kidneys enhance symmetrically with contrast. No hydronephrosis.  Stomach/Bowel: No abnormal bowel wall thickening or evidence for bowel obstruction. No free fluid or free intraperitoneal air.  Vascular/Lymphatic: Slight interval increase in size of probable necrotic porta hepatic lymphadenopathy measuring 1.7 x 2.2 cm, previously 1.5 x 1.9 cm (  image 59; series 2).  Other: Multiple injection granulomata within the ventral abdominal wall.  Musculoskeletal: Grossly stable osseous metastatic disease including multiple mixed lytic and sclerotic lesions with the largest lesion in the sacrum and left acetabulum.  IMPRESSION: 1. Interval increase in left perihilar consolidation, potentially secondary to post radiation changes. Infectious/inflammatory process or metastatic disease are additional considerations. Attention on followup is recommended. 2. Interval increase in size of now moderate left pleural effusion. 3. Slight interval increase in size of likely necrotic porta hepatic adenopathy. 4. Interval increase in size of low-attenuation splenic lesion. 5. Otherwise no significant interval change in additional bilateral pulmonary nodules. 6. No significant interval change in mediastinal and left hilar adenopathy. 7. Grossly stable hepatic metastatic disease. 8. Grossly stable osseous metastatic disease.   Electronically Signed   By: Lovey Newcomer M.D.   On: 06/27/2014 08:40   Ct Abdomen Pelvis W  Contrast  06/27/2014   CLINICAL DATA:  Patient with history of metastatic lung cancer. On chemotherapy.  EXAM: CT CHEST, ABDOMEN, AND PELVIS WITH CONTRAST  TECHNIQUE: Multidetector CT imaging of the chest, abdomen and pelvis was performed following the standard protocol during bolus administration of intravenous contrast.  CONTRAST:  149m OMNIPAQUE IOHEXOL 300 MG/ML  SOLN  COMPARISON:  CT 05/02/2014  FINDINGS: CT CHEST FINDINGS  Mediastinum/Nodes: Grossly stable mediastinal and left hilar lymphadenopathy. 1.3 cm pretracheal lymph node (image 22; series 2), previously measured 1.4 cm. 1.7 cm subcarinal lymph node (image 30; series 2) previously measured 1.8 cm normal heart size. Right anterior chest wall Port-A-Cath with tip terminating in the right atrium. Normal heart size. Trace pericardial fluid.  Lungs/Pleura: Central airways are patent. Interval increase in size of now moderate left pleural effusion. There is interval increase in left parahilar consolidation. Multiple bilateral pulmonary nodules are re- demonstrated and grossly stable when compared to previous examination. Reference nodules are as follows: Left upper lobe nodule measuring 0.9 x 0.8 cm (image 15; series 4), previously 1.0 x 0.9 cm. Left upper lobe nodule measuring 1.5 x 1.6 cm (image 25; series 4), previously 1.5 x 1.6 cm. Left upper lobe nodule measuring 1.2 x 0.8 cm (image 28; series 4), previously 1.2 x 0.8 cm. 2.9 x 2.5 cm left lower lobe nodule (image 34; series 4), previously 2.9 x 2.6 cm. The adjacent left lower lobe mass measuring 3.9 x 3.1 cm (image 37; series 4), previously 3.4 x 4.0 cm. There is adjacent airspace disease. 1.8 x 1.0 cm right upper lobe nodule (image 30; series 4), previously 1.7 x 0.9 cm  Musculoskeletal: Unchanged left posterior sixth rib metastasis. No progressive osseous metastatic disease.  CT ABDOMEN AND PELVIS FINDINGS  Hepatobiliary: Liver is normal in size and contour. No significant interval change in  hepatic metastatic disease including a 1.4 x 1.3 cm lesion within the posterior right hepatic lobe (image 52; series 2, previously measuring 1.5 x 1.2 cm. No definite new hepatic lesions are identified. Gallbladder is unremarkable.  Pancreas: Unremarkable  Spleen: Interval increase in size of now 1.3 cm low-attenuation mass within the inferior aspect of the spleen, previously measuring 0.6 cm (image 64; series 2).  Adrenals/Urinary Tract: Normal bilateral adrenal glands. The kidneys enhance symmetrically with contrast. No hydronephrosis.  Stomach/Bowel: No abnormal bowel wall thickening or evidence for bowel obstruction. No free fluid or free intraperitoneal air.  Vascular/Lymphatic: Slight interval increase in size of probable necrotic porta hepatic lymphadenopathy measuring 1.7 x 2.2 cm, previously 1.5 x 1.9 cm (image 59; series 2).  Other:  Multiple injection granulomata within the ventral abdominal wall.  Musculoskeletal: Grossly stable osseous metastatic disease including multiple mixed lytic and sclerotic lesions with the largest lesion in the sacrum and left acetabulum.  IMPRESSION: 1. Interval increase in left perihilar consolidation, potentially secondary to post radiation changes. Infectious/inflammatory process or metastatic disease are additional considerations. Attention on followup is recommended. 2. Interval increase in size of now moderate left pleural effusion. 3. Slight interval increase in size of likely necrotic porta hepatic adenopathy. 4. Interval increase in size of low-attenuation splenic lesion. 5. Otherwise no significant interval change in additional bilateral pulmonary nodules. 6. No significant interval change in mediastinal and left hilar adenopathy. 7. Grossly stable hepatic metastatic disease. 8. Grossly stable osseous metastatic disease.   Electronically Signed   By: Lovey Newcomer M.D.   On: 06/27/2014 08:40     Patient had an MRI of her brain performed on 06/10/2014 that was read by  Dr. Lynden Ang. The study revealed punctate focus of enhancement in the right cerebellum is no longer seen and there is the stable 5 mm enhancing nodule in the left cerebellar hemisphere.  ASSESSMENT AND PLAN: This is a very pleasant 64 years old white female with metastatic non-small cell lung cancer, adenocarcinoma status post induction chemotherapy with carboplatin and Alimta but unfortunately had evidence for disease progression. She is currently undergoing immunotherapy with Nivolumab status post 12 cycles. The patient is doing very well with this treatment and she continues to have significant improvement in her symptoms. Dr. Julien Nordmann was consulted and shared this visit. He reviewed the result of her most recent scans from Monday.  The CT of the chest, abdomen, and pelvis showed stable disease in her chest and abdomen.  After 4 more cycles, additional restaging scans will be performed. Her fentanyl prescription was refilled during this visit. She will continue to use senna and colace for her constipation. She was advised to proceed with cycle #13 today. She is also due for xgeva at this time. She will return in 2 weeks for labs, a follow up visit, and cycle 14 of treatment.   She understands and agrees with this plan. She has been encouraged to call with any issues that might arise before her next visit here.  Laurie Panda, NP 06/30/2014  ADDENDUM: Hematology/Oncology Attending: I had a face to face encounter with the patient. I recommended her care plan. This is a very pleasant 64 years old white female with metastatic non-small cell lung cancer currently undergoing immunotherapy with Nivolumab status post 12 cycles. She is tolerating her treatment fairly well and she has significant improvement in her genetic condition since he started this course of immunotherapy. The recent CT scan showed no significant evidence for disease progression except for interval increase in the left  perihilar consolidation as well as increased in a moderate left pleural effusion and a necrotic porta hepatis adenopathy. I discussed the scan results with the patient and her boyfriend. I recommended for her to continue with the same regimen with Nivolumab. She will start cycle #13 today. The patient would come back for follow-up visit in 2 weeks with the next cycle of her treatment.  She was advised to call immediately if she has any concerning symptoms in the interval.   Disclaimer: This note was dictated with voice recognition software. Similar sounding words can inadvertently be transcribed and may be missed upon review. Eilleen Kempf., MD 07/03/2014

## 2014-06-30 NOTE — Addendum Note (Signed)
Addended by: Marcelino Duster on: 06/30/2014 11:05 AM   Modules accepted: Level of Service

## 2014-06-30 NOTE — Patient Instructions (Signed)

## 2014-07-03 NOTE — Addendum Note (Signed)
Addended by: Curt Bears on: 07/03/2014 04:02 PM   Modules accepted: Level of Service

## 2014-07-14 ENCOUNTER — Ambulatory Visit (HOSPITAL_BASED_OUTPATIENT_CLINIC_OR_DEPARTMENT_OTHER): Payer: BC Managed Care – PPO

## 2014-07-14 ENCOUNTER — Ambulatory Visit: Payer: BC Managed Care – PPO

## 2014-07-14 ENCOUNTER — Ambulatory Visit (HOSPITAL_BASED_OUTPATIENT_CLINIC_OR_DEPARTMENT_OTHER): Payer: BC Managed Care – PPO | Admitting: Physician Assistant

## 2014-07-14 ENCOUNTER — Encounter: Payer: Self-pay | Admitting: Physician Assistant

## 2014-07-14 ENCOUNTER — Other Ambulatory Visit (HOSPITAL_BASED_OUTPATIENT_CLINIC_OR_DEPARTMENT_OTHER): Payer: BC Managed Care – PPO

## 2014-07-14 ENCOUNTER — Other Ambulatory Visit: Payer: BC Managed Care – PPO

## 2014-07-14 VITALS — BP 117/50 | HR 100 | Temp 98.4°F | Resp 18 | Ht 63.0 in | Wt 130.7 lb

## 2014-07-14 DIAGNOSIS — C3432 Malignant neoplasm of lower lobe, left bronchus or lung: Secondary | ICD-10-CM

## 2014-07-14 DIAGNOSIS — C3490 Malignant neoplasm of unspecified part of unspecified bronchus or lung: Secondary | ICD-10-CM

## 2014-07-14 DIAGNOSIS — Z5112 Encounter for antineoplastic immunotherapy: Secondary | ICD-10-CM | POA: Diagnosis not present

## 2014-07-14 DIAGNOSIS — C7951 Secondary malignant neoplasm of bone: Secondary | ICD-10-CM | POA: Diagnosis not present

## 2014-07-14 DIAGNOSIS — Z95828 Presence of other vascular implants and grafts: Secondary | ICD-10-CM

## 2014-07-14 LAB — COMPREHENSIVE METABOLIC PANEL (CC13)
ALBUMIN: 3.5 g/dL (ref 3.5–5.0)
ALT: 12 U/L (ref 0–55)
AST: 17 U/L (ref 5–34)
Alkaline Phosphatase: 57 U/L (ref 40–150)
Anion Gap: 12 mEq/L — ABNORMAL HIGH (ref 3–11)
BUN: 15.3 mg/dL (ref 7.0–26.0)
CHLORIDE: 106 meq/L (ref 98–109)
CO2: 22 meq/L (ref 22–29)
Calcium: 9.1 mg/dL (ref 8.4–10.4)
Creatinine: 0.9 mg/dL (ref 0.6–1.1)
EGFR: 70 mL/min/{1.73_m2} — ABNORMAL LOW (ref >90)
Glucose: 127 mg/dl (ref 70–140)
POTASSIUM: 4.3 meq/L (ref 3.5–5.1)
Sodium: 139 mEq/L (ref 136–145)
Total Bilirubin: 1.47 mg/dL — ABNORMAL HIGH (ref 0.20–1.20)
Total Protein: 6.8 g/dL (ref 6.4–8.3)

## 2014-07-14 LAB — CBC WITH DIFFERENTIAL/PLATELET
BASO%: 0.9 % (ref 0.0–2.0)
Basophils Absolute: 0.1 10*3/uL (ref 0.0–0.1)
EOS%: 7.8 % — AB (ref 0.0–7.0)
Eosinophils Absolute: 0.5 10*3/uL (ref 0.0–0.5)
HEMATOCRIT: 33.2 % — AB (ref 34.8–46.6)
HEMOGLOBIN: 11 g/dL — AB (ref 11.6–15.9)
LYMPH#: 0.6 10*3/uL — AB (ref 0.9–3.3)
LYMPH%: 9.6 % — ABNORMAL LOW (ref 14.0–49.7)
MCH: 27.5 pg (ref 25.1–34.0)
MCHC: 33.1 g/dL (ref 31.5–36.0)
MCV: 83.3 fL (ref 79.5–101.0)
MONO#: 0.4 10*3/uL (ref 0.1–0.9)
MONO%: 5.9 % (ref 0.0–14.0)
NEUT#: 4.8 10*3/uL (ref 1.5–6.5)
NEUT%: 75.8 % (ref 38.4–76.8)
Platelets: 246 10*3/uL (ref 145–400)
RBC: 3.98 10*6/uL (ref 3.70–5.45)
RDW: 15.6 % — AB (ref 11.2–14.5)
WBC: 6.3 10*3/uL (ref 3.9–10.3)

## 2014-07-14 MED ORDER — NIVOLUMAB CHEMO INJECTION 100 MG/10ML
3.0000 mg/kg | Freq: Once | INTRAVENOUS | Status: AC
  Administered 2014-07-14: 180 mg via INTRAVENOUS
  Filled 2014-07-14: qty 18

## 2014-07-14 MED ORDER — SODIUM CHLORIDE 0.9 % IJ SOLN
10.0000 mL | INTRAMUSCULAR | Status: DC | PRN
  Administered 2014-07-14: 10 mL via INTRAVENOUS
  Filled 2014-07-14: qty 10

## 2014-07-14 MED ORDER — HEPARIN SOD (PORK) LOCK FLUSH 100 UNIT/ML IV SOLN
500.0000 [IU] | Freq: Once | INTRAVENOUS | Status: AC | PRN
  Administered 2014-07-14: 500 [IU]
  Filled 2014-07-14: qty 5

## 2014-07-14 MED ORDER — SODIUM CHLORIDE 0.9 % IJ SOLN
10.0000 mL | INTRAMUSCULAR | Status: DC | PRN
  Administered 2014-07-14: 10 mL
  Filled 2014-07-14: qty 10

## 2014-07-14 MED ORDER — SODIUM CHLORIDE 0.9 % IV SOLN
Freq: Once | INTRAVENOUS | Status: AC
  Administered 2014-07-14: 15:00:00 via INTRAVENOUS

## 2014-07-14 NOTE — Patient Instructions (Signed)
[facility] Discharge Instructions for Patients Receiving Chemotherapy  Today you received the following chemotherapy agents Nivolumab.  To help prevent nausea and vomiting after your treatment, we encourage you to take your nausea medication as prescribed.   If you develop nausea and vomiting that is not controlled by your nausea medication, call the clinic.   BELOW ARE SYMPTOMS THAT SHOULD BE REPORTED IMMEDIATELY:  *FEVER GREATER THAN 100.5 F  *CHILLS WITH OR WITHOUT FEVER  NAUSEA AND VOMITING THAT IS NOT CONTROLLED WITH YOUR NAUSEA MEDICATION  *UNUSUAL SHORTNESS OF BREATH  *UNUSUAL BRUISING OR BLEEDING  TENDERNESS IN MOUTH AND THROAT WITH OR WITHOUT PRESENCE OF ULCERS  *URINARY PROBLEMS  *BOWEL PROBLEMS  UNUSUAL RASH Items with * indicate a potential emergency and should be followed up as soon as possible.  Feel free to call the clinic you have any questions or concerns. The clinic phone number is [phone].  Please show the CHEMO ALERT CARD at check-in to the Emergency Department and triage nurse.   

## 2014-07-14 NOTE — Progress Notes (Addendum)
[place] [phone]   [phone]  OFFICE PROGRESS NOTE  [name], MD [place] [address]  DIAGNOSIS: stage IV (T2a, N3, M1b) non-small cell lung cancer, adenocarcinoma presented with large left lower lobe lung mass in addition to bilateral pulmonary nodules and mediastinal and supraclavicular lymphadenopathy diagnosed in May of 2015.  Genomic Alterations Identified? ERBB3 amplification CDK4 amplification IDH2 R172S KRAS G12D MDM2 amplification RBM10 [id number] TERC amplification - equivocal? Additional Disease-relevant Genes with No Reportable Alterations Identified? RET ALK BRAF ERBB2 MET EGFR  PRIOR THERAPY:  1) status post palliative radiotherapy to the left hip between 06/12 to [facility]30[facility]  status post stereotactic radiotherapy to brain lesions under the care of Dr. KKatherine Roanat DAlta Sierraon 11/17/2013 2) Systemic chemotherapy with carboplatin for AUC of 5, Alimta 500 mg/M2 and Avastin 15 mg/KG every 3 weeks, status post 5 cycles. [facility]  CURRENT THERAPY:  1) immunotherapy with Nivolmab at 3 mg/kg given every 3 weeks. Status post 13 cycles  2) Xgeva 120 mcg subcutaneously every 2 months.  INTERVAL HISTORY: Sherri GARRETTE89y.o. female returns to the clinic today for follow-up visit, accompanied by her boyfriend. She is feeling well today. She continues to be able to walk for a mile and a half with her walker and a half mile without the walker. She is eating and drinking well. She has gained an additional 2.7 pounds since her last office visit 2 weeks ago. She voices no specific complaints today.  She denies shortness of breath, chest pain, cough, or hemoptysis.   MEDICAL HISTORY: Past Medical History  Diagnosis Date  . Arthritis   .  Bilateral ovarian cysts   . GERD (gastroesophageal reflux disease)   . Strain of hip flexor 06/2013    left side torn  . Cancer     lung ca  . Bone metastases     to left hip and spine  . Radiation     at [facility] to left hip, sacrum and brain  . Radiation 02/10/14-03/06/14    left central chest 35 gray    ALLERGIES:  is allergic to other; ketoconazole; lorazepam; morphine and related; nystatin; sporanox; tylenol; vitamin d; and zofran.  MEDICATIONS:  Current Outpatient Prescriptions  Medication Sig Dispense Refill  . albuterol (PROVENTIL HFA;VENTOLIN HFA) 108 (90 BASE) MCG/ACT inhaler Inhale 1-2 puffs into the lungs every 6 (six) hours as needed for wheezing or shortness of breath. 1 Inhaler 2  . calcium carbonate (OS-CAL) 1250 MG chewable tablet Chew 4 tablets by mouth daily.    . codeine 30 MG tablet Take 1 tablet (30 mg total) by mouth every 4 (four) hours as needed (cough). 80 tablet 0  . [employer] SENNA PLUS 8.6-50 MG per tablet   11  . diphenhydrAMINE (SOMINEX) 25 MG tablet Take 25 mg by mouth at bedtime as needed for sleep. Takes 2 tablets every 3-4 hours during the night.    . docusate sodium (COLACE) 100 MG capsule Take 100 mg by mouth 2 (two) times daily.    .Marland Kitchendronabinol (MARINOL) 2.5 MG capsule Take 1 capsule (2.5 mg total) by mouth 2 (two) times daily before a meal. 60 capsule 0  . esomeprazole (NEXIUM) 40 MG capsule Take 1 capsule (40 mg total) by mouth 2 (two) times daily before  a meal. 60 capsule 0  . fentaNYL (DURAGESIC - DOSED MCG/HR) 50 MCG/HR Place 1 patch (50 mcg total) onto the skin every 3 (three) days. 10 patch 0  . glycerin adult 2 G SUPP Place 1 suppository rectally once as needed for moderate constipation.    . polyethylene glycol (MIRALAX / GLYCOLAX) packet Take 17 g by mouth daily as needed for moderate constipation.    . prochlorperazine (COMPAZINE) 10 MG tablet Take 1 tablet (10 mg total) by mouth every 6 (six) hours as needed for nausea or vomiting. 30 tablet 1  .  rivaroxaban (XARELTO) 20 MG TABS tablet Take 1 tablet (20 mg total) by mouth daily with supper. 30 tablet 5  . traMADol (ULTRAM) 50 MG tablet TAKE 1 TABLET BY MOUTH EVERY 6 HOURS AS NEEDED 60 tablet 0  . bisacodyl (DULCOLAX) 10 MG suppository Place 1 suppository (10 mg total) rectally daily as needed for moderate constipation. (Patient not taking: Reported on 07/14/2014) 10 suppository 0   No current facility-administered medications for this visit.   Facility-Administered Medications Ordered in Other Visits  Medication Dose Route Frequency Provider Last Rate Last Dose  . sodium chloride 0.9 % injection 10 mL  10 mL Intracatheter PRN Curt Bears, MD   10 mL at 04/07/14 1200    SURGICAL HISTORY:  Past Surgical History  Procedure Laterality Date  . Dilation and curettage of uterus  2004    x 3   . Tubal ligation    . Colonoscopy  04/05/2004    normal     REVIEW OF SYSTEMS:  Constitutional: negative Eyes: negative Ears, nose, mouth, throat, and face: negative Respiratory: negative Cardiovascular: negative Gastrointestinal: positive for constipation Genitourinary:negative Integument/breast: negative Hematologic/lymphatic: negative Musculoskeletal:positive for back pain Neurological: negative Behavioral/Psych: negative Endocrine: negative Allergic/Immunologic: negative   PHYSICAL EXAMINATION: General appearance: alert, cooperative and no distress Head: Normocephalic, without obvious abnormality, atraumatic Neck: no adenopathy, no JVD, supple, symmetrical, trachea midline and thyroid not enlarged, symmetric, no tenderness/mass/nodules Lymph nodes: Cervical, supraclavicular, and axillary nodes normal. Resp: clear to auscultation bilaterally Back: symmetric, no curvature. ROM normal. No CVA tenderness. Cardio: regular rate and rhythm, S1, S2 normal, no murmur, click, rub or gallop GI: soft, non-tender; bowel sounds normal; no masses,  no organomegaly Extremities: extremities  normal, atraumatic, no cyanosis or edema  ECOG PERFORMANCE STATUS: 1 - Symptomatic but completely ambulatory  Blood pressure 117/50, pulse 100, temperature 98.4 F (36.9 C), temperature source Oral, resp. rate 18, height _0  (1.6 m), weight 130 lb 11.2 oz (59.285 kg), last menstrual period 01/14/2008, SpO2 100 %.  LABORATORY DATA: Lab Results  Component Value Date   WBC 6.3 07/14/2014   HGB 11.0* 07/14/2014   HCT 33.2* 07/14/2014   MCV 83.3 07/14/2014   PLT 246 07/14/2014      Chemistry      Component Value Date/Time   NA 139 07/14/2014 1338   NA 141 01/16/2014 0530   K 4.3 07/14/2014 1338   K 3.5* 01/16/2014 0530   CL 106 01/16/2014 0530   CO2 22 07/14/2014 1338   CO2 24 01/16/2014 0530   BUN 15.3 07/14/2014 1338   BUN 7 01/16/2014 0530   CREATININE 0.9 07/14/2014 1338   CREATININE 0.63 01/16/2014 0530   CREATININE 0.83 09/07/2013 1506      Component Value Date/Time   CALCIUM 9.1 07/14/2014 1338   CALCIUM 8.0* 01/16/2014 0530   ALKPHOS 57 07/14/2014 1338   ALKPHOS 66 11/14/2013 1057   AST 17 07/14/2014  1338   AST 12 11/14/2013 1057   ALT 12 07/14/2014 1338   ALT 15 11/14/2013 1057   BILITOT 1.47* 07/14/2014 1338   BILITOT 1.4* 11/14/2013 1057       RADIOGRAPHIC STUDIES: Ct Chest W Contrast  06/27/2014   CLINICAL DATA:  Patient with history of metastatic lung cancer. On chemotherapy.  EXAM: CT CHEST, ABDOMEN, AND PELVIS WITH CONTRAST  TECHNIQUE: Multidetector CT imaging of the chest, abdomen and pelvis was performed following the standard protocol during bolus administration of intravenous contrast.  CONTRAST:  168m OMNIPAQUE IOHEXOL 300 MG/ML  SOLN  COMPARISON:  CT 05/02/2014  FINDINGS: CT CHEST FINDINGS  Mediastinum/Nodes: Grossly stable mediastinal and left hilar lymphadenopathy. 1.3 cm pretracheal lymph node (image 22; series 2), previously measured 1.4 cm. 1.7 cm subcarinal lymph node (image 30; series 2) previously measured 1.8 cm normal heart size. Right  anterior chest wall Port-A-Cath with tip terminating in the right atrium. Normal heart size. Trace pericardial fluid.  Lungs/Pleura: Central airways are patent. Interval increase in size of now moderate left pleural effusion. There is interval increase in left parahilar consolidation. Multiple bilateral pulmonary nodules are re- demonstrated and grossly stable when compared to previous examination. Reference nodules are as follows: Left upper lobe nodule measuring 0.9 x 0.8 cm (image 15; series 4), previously 1.0 x 0.9 cm. Left upper lobe nodule measuring 1.5 x 1.6 cm (image 25; series 4), previously 1.5 x 1.6 cm. Left upper lobe nodule measuring 1.2 x 0.8 cm (image 28; series 4), previously 1.2 x 0.8 cm. 2.9 x 2.5 cm left lower lobe nodule (image 34; series 4), previously 2.9 x 2.6 cm. The adjacent left lower lobe mass measuring 3.9 x 3.1 cm (image 37; series 4), previously 3.4 x 4.0 cm. There is adjacent airspace disease. 1.8 x 1.0 cm right upper lobe nodule (image 30; series 4), previously 1.7 x 0.9 cm  Musculoskeletal: Unchanged left posterior sixth rib metastasis. No progressive osseous metastatic disease.  CT ABDOMEN AND PELVIS FINDINGS  Hepatobiliary: Liver is normal in size and contour. No significant interval change in hepatic metastatic disease including a 1.4 x 1.3 cm lesion within the posterior right hepatic lobe (image 52; series 2, previously measuring 1.5 x 1.2 cm. No definite new hepatic lesions are identified. Gallbladder is unremarkable.  Pancreas: Unremarkable  Spleen: Interval increase in size of now 1.3 cm low-attenuation mass within the inferior aspect of the spleen, previously measuring 0.6 cm (image 64; series 2).  Adrenals/Urinary Tract: Normal bilateral adrenal glands. The kidneys enhance symmetrically with contrast. No hydronephrosis.  Stomach/Bowel: No abnormal bowel wall thickening or evidence for bowel obstruction. No free fluid or free intraperitoneal air.  Vascular/Lymphatic: Slight  interval increase in size of probable necrotic porta hepatic lymphadenopathy measuring 1.7 x 2.2 cm, previously 1.5 x 1.9 cm (image 59; series 2).  Other: Multiple injection granulomata within the ventral abdominal wall.  Musculoskeletal: Grossly stable osseous metastatic disease including multiple mixed lytic and sclerotic lesions with the largest lesion in the sacrum and left acetabulum.  IMPRESSION: 1. Interval increase in left perihilar consolidation, potentially secondary to post radiation changes. Infectious/inflammatory process or metastatic disease are additional considerations. Attention on followup is recommended. 2. Interval increase in size of now moderate left pleural effusion. 3. Slight interval increase in size of likely necrotic porta hepatic adenopathy. 4. Interval increase in size of low-attenuation splenic lesion. 5. Otherwise no significant interval change in additional bilateral pulmonary nodules. 6. No significant interval change in mediastinal and left  hilar adenopathy. 7. Grossly stable hepatic metastatic disease. 8. Grossly stable osseous metastatic disease.   Electronically Signed   By: Lovey Newcomer M.D.   On: 06/27/2014 08:40   Ct Abdomen Pelvis W Contrast  06/27/2014   CLINICAL DATA:  Patient with history of metastatic lung cancer. On chemotherapy.  EXAM: CT CHEST, ABDOMEN, AND PELVIS WITH CONTRAST  TECHNIQUE: Multidetector CT imaging of the chest, abdomen and pelvis was performed following the standard protocol during bolus administration of intravenous contrast.  CONTRAST:  114m OMNIPAQUE IOHEXOL 300 MG/ML  SOLN  COMPARISON:  CT 05/02/2014  FINDINGS: CT CHEST FINDINGS  Mediastinum/Nodes: Grossly stable mediastinal and left hilar lymphadenopathy. 1.3 cm pretracheal lymph node (image 22; series 2), previously measured 1.4 cm. 1.7 cm subcarinal lymph node (image 30; series 2) previously measured 1.8 cm normal heart size. Right anterior chest wall Port-A-Cath with tip terminating in the  right atrium. Normal heart size. Trace pericardial fluid.  Lungs/Pleura: Central airways are patent. Interval increase in size of now moderate left pleural effusion. There is interval increase in left parahilar consolidation. Multiple bilateral pulmonary nodules are re- demonstrated and grossly stable when compared to previous examination. Reference nodules are as follows: Left upper lobe nodule measuring 0.9 x 0.8 cm (image 15; series 4), previously 1.0 x 0.9 cm. Left upper lobe nodule measuring 1.5 x 1.6 cm (image 25; series 4), previously 1.5 x 1.6 cm. Left upper lobe nodule measuring 1.2 x 0.8 cm (image 28; series 4), previously 1.2 x 0.8 cm. 2.9 x 2.5 cm left lower lobe nodule (image 34; series 4), previously 2.9 x 2.6 cm. The adjacent left lower lobe mass measuring 3.9 x 3.1 cm (image 37; series 4), previously 3.4 x 4.0 cm. There is adjacent airspace disease. 1.8 x 1.0 cm right upper lobe nodule (image 30; series 4), previously 1.7 x 0.9 cm  Musculoskeletal: Unchanged left posterior sixth rib metastasis. No progressive osseous metastatic disease.  CT ABDOMEN AND PELVIS FINDINGS  Hepatobiliary: Liver is normal in size and contour. No significant interval change in hepatic metastatic disease including a 1.4 x 1.3 cm lesion within the posterior right hepatic lobe (image 52; series 2, previously measuring 1.5 x 1.2 cm. No definite new hepatic lesions are identified. Gallbladder is unremarkable.  Pancreas: Unremarkable  Spleen: Interval increase in size of now 1.3 cm low-attenuation mass within the inferior aspect of the spleen, previously measuring 0.6 cm (image 64; series 2).  Adrenals/Urinary Tract: Normal bilateral adrenal glands. The kidneys enhance symmetrically with contrast. No hydronephrosis.  Stomach/Bowel: No abnormal bowel wall thickening or evidence for bowel obstruction. No free fluid or free intraperitoneal air.  Vascular/Lymphatic: Slight interval increase in size of probable necrotic porta hepatic  lymphadenopathy measuring 1.7 x 2.2 cm, previously 1.5 x 1.9 cm (image 59; series 2).  Other: Multiple injection granulomata within the ventral abdominal wall.  Musculoskeletal: Grossly stable osseous metastatic disease including multiple mixed lytic and sclerotic lesions with the largest lesion in the sacrum and left acetabulum.  IMPRESSION: 1. Interval increase in left perihilar consolidation, potentially secondary to post radiation changes. Infectious/inflammatory process or metastatic disease are additional considerations. Attention on followup is recommended. 2. Interval increase in size of now moderate left pleural effusion. 3. Slight interval increase in size of likely necrotic porta hepatic adenopathy. 4. Interval increase in size of low-attenuation splenic lesion. 5. Otherwise no significant interval change in additional bilateral pulmonary nodules. 6. No significant interval change in mediastinal and left hilar adenopathy. 7. Grossly stable hepatic  metastatic disease. 8. Grossly stable osseous metastatic disease.   Electronically Signed   By: Lovey Newcomer M.D.   On: 06/27/2014 08:40     Patient had an MRI of her brain performed on 06/10/2014 that was read by Dr. Lynden Ang. The study revealed punctate focus of enhancement in the right cerebellum is no longer seen and there is the stable 5 mm enhancing nodule in the left cerebellar hemisphere.  ASSESSMENT AND PLAN: This is a very pleasant 64 years old white female with metastatic non-small cell lung cancer, adenocarcinoma status post induction chemotherapy with carboplatin and Alimta but unfortunately had evidence for disease progression. She is currently undergoing immunotherapy with Nivolumab status post 13 cycles. The patient is doing very well with this treatment and she continues to have significant improvement in her symptoms. Patient was discussed with and also seen by Dr. Julien Nordmann. She will continue with her current treatment with  immunotherapy consisting of  Nivolumab 3 mg/kg given every 2 weeks. She will proceed with cycle #14 today.  After 2 more cycles, additional restaging scans will be performed. She will return in 2 weeks for labs, a follow up visit, and cycle #15 of treatment.   She understands and agrees with this plan. She has been encouraged to call with any issues that might arise before her next visit here.  Carlton Adam, PA-C 07/14/2014  ADDENDUM: Hematology/Oncology Attending: I had a face to face encounter with the patient. I recommended her care plan. This is a very pleasant 65 years old white female with metastatic non-small cell lung cancer, adenocarcinoma who is currently undergoing immunotherapy with Nivolumab status post 13 cycles and tolerating her treatment fairly well. The patient has significant improvement in her condition after starting the immunotherapy. She is now able to walk and exercise regularly. She was a wheelchair-bound for several months before starting the immunotherapy. I recommended for the patient to proceed with cycle #14 today as scheduled. She will come back for follow-up visit in 2 weeks with the next cycle of her treatment. The patient was advised to call immediately if she has any concerning symptoms in the interval.  Disclaimer: This note was dictated with voice recognition software. Similar sounding words can inadvertently be transcribed and may be missed upon review. Eilleen Kempf., MD 07/19/2014

## 2014-07-14 NOTE — Patient Instructions (Signed)

## 2014-07-18 NOTE — Patient Instructions (Signed)
Follow up in 2 weeks prior to your next scheduled cycle of immunotherapy 

## 2014-07-28 ENCOUNTER — Ambulatory Visit (HOSPITAL_BASED_OUTPATIENT_CLINIC_OR_DEPARTMENT_OTHER): Payer: BC Managed Care – PPO | Admitting: Physician Assistant

## 2014-07-28 ENCOUNTER — Ambulatory Visit: Payer: BC Managed Care – PPO

## 2014-07-28 ENCOUNTER — Other Ambulatory Visit (HOSPITAL_BASED_OUTPATIENT_CLINIC_OR_DEPARTMENT_OTHER): Payer: BC Managed Care – PPO

## 2014-07-28 ENCOUNTER — Ambulatory Visit (HOSPITAL_BASED_OUTPATIENT_CLINIC_OR_DEPARTMENT_OTHER): Payer: BC Managed Care – PPO

## 2014-07-28 ENCOUNTER — Encounter: Payer: Self-pay | Admitting: Physician Assistant

## 2014-07-28 ENCOUNTER — Other Ambulatory Visit (HOSPITAL_COMMUNITY)
Admission: RE | Admit: 2014-07-28 | Discharge: 2014-07-28 | Disposition: A | Discharge status: Home / Self Care | Payer: BC Managed Care – PPO | Source: Ambulatory Visit | Attending: Internal Medicine | Admitting: Internal Medicine

## 2014-07-28 VITALS — BP 105/53 | HR 92 | Temp 98.2°F | Resp 18 | Ht 63.0 in | Wt 127.8 lb

## 2014-07-28 DIAGNOSIS — C7951 Secondary malignant neoplasm of bone: Secondary | ICD-10-CM

## 2014-07-28 DIAGNOSIS — C3490 Malignant neoplasm of unspecified part of unspecified bronchus or lung: Secondary | ICD-10-CM

## 2014-07-28 DIAGNOSIS — C3432 Malignant neoplasm of lower lobe, left bronchus or lung: Secondary | ICD-10-CM

## 2014-07-28 DIAGNOSIS — Z79899 Other long term (current) drug therapy: Secondary | ICD-10-CM

## 2014-07-28 DIAGNOSIS — Z95828 Presence of other vascular implants and grafts: Secondary | ICD-10-CM

## 2014-07-28 DIAGNOSIS — M79672 Pain in left foot: Secondary | ICD-10-CM

## 2014-07-28 DIAGNOSIS — Z5112 Encounter for antineoplastic immunotherapy: Secondary | ICD-10-CM

## 2014-07-28 DIAGNOSIS — C7931 Secondary malignant neoplasm of brain: Secondary | ICD-10-CM

## 2014-07-28 DIAGNOSIS — C7889 Secondary malignant neoplasm of other digestive organs: Secondary | ICD-10-CM | POA: Diagnosis not present

## 2014-07-28 DIAGNOSIS — C770 Secondary and unspecified malignant neoplasm of lymph nodes of head, face and neck: Secondary | ICD-10-CM

## 2014-07-28 LAB — CBC WITH DIFFERENTIAL/PLATELET
BASO%: 0.4 % (ref 0.0–2.0)
BASOS ABS: 0 10*3/uL (ref 0.0–0.1)
EOS ABS: 0.4 10*3/uL (ref 0.0–0.5)
EOS%: 8.9 % — AB (ref 0.0–7.0)
HCT: 33.8 % — ABNORMAL LOW (ref 34.8–46.6)
HEMOGLOBIN: 11 g/dL — AB (ref 11.6–15.9)
LYMPH%: 11.9 % — AB (ref 14.0–49.7)
MCH: 27.9 pg (ref 25.1–34.0)
MCHC: 32.5 g/dL (ref 31.5–36.0)
MCV: 85.8 fL (ref 79.5–101.0)
MONO#: 0.4 10*3/uL (ref 0.1–0.9)
MONO%: 7.3 % (ref 0.0–14.0)
NEUT%: 71.5 % (ref 38.4–76.8)
NEUTROS ABS: 3.5 10*3/uL (ref 1.5–6.5)
PLATELETS: 200 10*3/uL (ref 145–400)
RBC: 3.94 10*6/uL (ref 3.70–5.45)
RDW: 15.3 % — AB (ref 11.2–14.5)
WBC: 5 10*3/uL (ref 3.9–10.3)
lymph#: 0.6 10*3/uL — ABNORMAL LOW (ref 0.9–3.3)

## 2014-07-28 LAB — COMPREHENSIVE METABOLIC PANEL
ALK PHOS: 49 U/L (ref 39–117)
ALT: 12 U/L (ref 0–35)
ANION GAP: 5 (ref 5–15)
AST: 21 U/L (ref 0–37)
Albumin: 4 g/dL (ref 3.5–5.2)
BILIRUBIN TOTAL: 0.9 mg/dL (ref 0.3–1.2)
BUN: 13 mg/dL (ref 6–23)
CHLORIDE: 104 mmol/L (ref 96–112)
CO2: 28 mmol/L (ref 19–32)
Calcium: 8.8 mg/dL (ref 8.4–10.5)
Creatinine, Ser: 0.85 mg/dL (ref 0.50–1.10)
GFR, EST AFRICAN AMERICAN: 84 mL/min — AB (ref >90)
GFR, EST NON AFRICAN AMERICAN: 72 mL/min — AB (ref >90)
GLUCOSE: 95 mg/dL (ref 70–99)
POTASSIUM: 4.4 mmol/L (ref 3.5–5.1)
Sodium: 137 mmol/L (ref 135–145)
Total Protein: 6.8 g/dL (ref 6.0–8.3)

## 2014-07-28 LAB — TSH CHCC: TSH: 3.957 m(IU)/L (ref 0.308–3.960)

## 2014-07-28 MED ORDER — HEPARIN SOD (PORK) LOCK FLUSH 100 UNIT/ML IV SOLN
500.0000 [IU] | Freq: Once | INTRAVENOUS | Status: AC | PRN
  Administered 2014-07-28: 500 [IU]
  Filled 2014-07-28: qty 5

## 2014-07-28 MED ORDER — SODIUM CHLORIDE 0.9 % IJ SOLN
10.0000 mL | INTRAMUSCULAR | Status: DC | PRN
  Administered 2014-07-28: 10 mL
  Filled 2014-07-28: qty 10

## 2014-07-28 MED ORDER — SODIUM CHLORIDE 0.9 % IV SOLN
3.0000 mg/kg | Freq: Once | INTRAVENOUS | Status: AC
  Administered 2014-07-28: 180 mg via INTRAVENOUS
  Filled 2014-07-28: qty 18

## 2014-07-28 MED ORDER — SODIUM CHLORIDE 0.9 % IJ SOLN
10.0000 mL | INTRAMUSCULAR | Status: DC | PRN
  Administered 2014-07-28: 10 mL via INTRAVENOUS
  Filled 2014-07-28: qty 10

## 2014-07-28 MED ORDER — SODIUM CHLORIDE 0.9 % IV SOLN
Freq: Once | INTRAVENOUS | Status: AC
  Administered 2014-07-28: 10:00:00 via INTRAVENOUS

## 2014-07-28 NOTE — Progress Notes (Signed)
[place] [phone]   [phone]  OFFICE PROGRESS NOTE  [name], MD [place] [address]  DIAGNOSIS: stage IV (T2a, N3, M1b) non-small cell lung cancer, adenocarcinoma presented with large left lower lobe lung mass in addition to bilateral pulmonary nodules and mediastinal and supraclavicular lymphadenopathy diagnosed in May of 2015.  Genomic Alterations Identified? ERBB3 amplification CDK4 amplification IDH2 R172S KRAS G12D MDM2 amplification RBM10 [id number] TERC amplification - equivocal? Additional Disease-relevant Genes with No Reportable Alterations Identified? RET ALK BRAF ERBB2 MET EGFR  PRIOR THERAPY:  1) status post [facility]  status post stereotactic radiotherapy to brain lesions [place] the [place] [place] 2) Systemic chemotherapy with carboplatin for AUC of 5, Alimta 500 mg/M2 and Avastin 15 mg/KG every 3 weeks, status post 5 cycles. [facility]  CURRENT THERAPY:  1) immunotherapy with Nivolmab at 3 mg/kg given every 3 weeks. Status post 14 cycles  2) Xgeva 120 mcg subcutaneously every 2 months.  INTERVAL HISTORY: Sherri GIBAS64y.o. female returns to the clinic today for follow-up visit, accompanied by her boyfriend. She is feeling well today. She continues to be able to walk for a mile and a half with her walker and a half mile without the walker. She is walking primarily on concrete surfaces.She is complaining of some left heel pain. She notes it  Has been a recent but gradual pain. She plans to see her podiatrist for further evaluation. She continues to eat and drink well. She voices no other specific complaints today.  She denies shortness  of breath, chest pain, cough, or hemoptysis.   MEDICAL HISTORY: Past Medical History  Diagnosis Date  . Arthritis   . Bilateral ovarian cysts   . GERD (gastroesophageal reflux disease)   . Strain of hip flexor 06/2013    left side torn  . Cancer     lung ca  . Bone metastases     to left hip and spine  . Radiation     at [facility] to left hip, sacrum and brain  . Radiation 02/10/14-03/06/14    left central chest 35 gray    ALLERGIES:  is allergic to other; ketoconazole; lorazepam; morphine and related; nystatin; sporanox; tylenol; vitamin d; and zofran.  MEDICATIONS:  Current Outpatient Prescriptions  Medication Sig Dispense Refill  . albuterol (PROVENTIL HFA;VENTOLIN HFA) 108 (90 BASE) MCG/ACT inhaler Inhale 1-2 puffs into the lungs every 6 (six) hours as needed for wheezing or shortness of breath. 1 Inhaler 2  . bisacodyl (DULCOLAX) 10 MG suppository Place 1 suppository (10 mg total) rectally daily as needed for moderate constipation. 10 suppository 0  . calcium carbonate (OS-CAL) 1250 MG chewable tablet Chew 4 tablets by mouth daily.    . codeine 30 MG tablet Take 1 tablet (30 mg total) by mouth every 4 (four) hours as needed (cough). 80 tablet 0  . [facility] SENNA PLUS 8.6-50 MG per tablet   11  . diphenhydrAMINE (SOMINEX) 25 MG tablet Take 25 mg by mouth at bedtime as needed for sleep. Takes 2 tablets every 3-4 hours during the night.    . docusate sodium (COLACE) 100 MG capsule Take 100 mg by mouth 2 (two) times daily.    .Marland Kitchen  dronabinol (MARINOL) 2.5 MG capsule Take 1 capsule (2.5 mg total) by mouth 2 (two) times daily before a meal. 60 capsule 0  . esomeprazole (NEXIUM) 40 MG capsule Take 1 capsule (40 mg total) by mouth 2 (two) times daily before a meal. 60 capsule 0  . fentaNYL (DURAGESIC - DOSED MCG/HR) 50 MCG/HR Place 1 patch (50 mcg total) onto the skin every 3 (three) days. 10 patch 0  . glycerin adult 2 G SUPP Place 1 suppository rectally once as needed for moderate constipation.     . polyethylene glycol (MIRALAX / GLYCOLAX) packet Take 17 g by mouth daily as needed for moderate constipation.    . prochlorperazine (COMPAZINE) 10 MG tablet Take 1 tablet (10 mg total) by mouth every 6 (six) hours as needed for nausea or vomiting. 30 tablet 1  . rivaroxaban (XARELTO) 20 MG TABS tablet Take 1 tablet (20 mg total) by mouth daily with supper. 30 tablet 5  . traMADol (ULTRAM) 50 MG tablet TAKE 1 TABLET BY MOUTH EVERY 6 HOURS AS NEEDED 60 tablet 0   No current facility-administered medications for this visit.   Facility-Administered Medications Ordered in Other Visits  Medication Dose Route Frequency Provider Last Rate Last Dose  . sodium chloride 0.9 % injection 10 mL  10 mL Intracatheter PRN Curt Bears, MD   10 mL at 04/07/14 1200  . sodium chloride 0.9 % injection 10 mL  10 mL Intracatheter PRN Curt Bears, MD   10 mL at 07/28/14 1215    SURGICAL HISTORY:  Past Surgical History  Procedure Laterality Date  . Dilation and curettage of uterus  2004    x 3   . Tubal ligation    . Colonoscopy  04/05/2004    normal     REVIEW OF SYSTEMS:  Constitutional: negative Eyes: negative Ears, nose, mouth, throat, and face: negative Respiratory: negative Cardiovascular: negative Gastrointestinal: positive for constipation Genitourinary:negative Integument/breast: negative Hematologic/lymphatic: negative Musculoskeletal:positive for back pain and left heel pain Neurological: negative Behavioral/Psych: negative Endocrine: negative Allergic/Immunologic: negative   PHYSICAL EXAMINATION: General appearance: alert, cooperative and no distress Head: Normocephalic, without obvious abnormality, atraumatic Neck: no adenopathy, no JVD, supple, symmetrical, trachea midline and thyroid not enlarged, symmetric, no tenderness/mass/nodules Lymph nodes: Cervical, supraclavicular, and axillary nodes normal. Resp: clear to auscultation bilaterally Back: symmetric, no curvature.  ROM normal. No CVA tenderness. Cardio: regular rate and rhythm, S1, S2 normal, no murmur, click, rub or gallop GI: soft, non-tender; bowel sounds normal; no masses,  no organomegaly Extremities: extremities normal, atraumatic, no cyanosis or edema  ECOG PERFORMANCE STATUS: 1 - Symptomatic but completely ambulatory  Blood pressure 105/53, pulse 92, temperature 98.2 F (36.8 C), temperature source Oral, resp. rate 18, height '5\' 3"'  (1.6 m), weight 127 lb 12.8 oz (57.97 kg), last menstrual period 01/14/2008, SpO2 100 %.  LABORATORY DATA: Lab Results  Component Value Date   WBC 5.0 07/28/2014   HGB 11.0* 07/28/2014   HCT 33.8* 07/28/2014   MCV 85.8 07/28/2014   PLT 200 07/28/2014      Chemistry      Component Value Date/Time   NA 137 07/28/2014 0916   NA 139 07/14/2014 1338   K 4.4 07/28/2014 0916   K 4.3 07/14/2014 1338   CL 104 07/28/2014 0916   CO2 28 07/28/2014 0916   CO2 22 07/14/2014 1338   BUN 13 07/28/2014 0916   BUN 15.3 07/14/2014 1338   CREATININE 0.85 07/28/2014 0916   CREATININE 0.9 07/14/2014 1338  CREATININE 0.83 09/07/2013 1506      Component Value Date/Time   CALCIUM 8.8 07/28/2014 0916   CALCIUM 9.1 07/14/2014 1338   ALKPHOS 49 07/28/2014 0916   ALKPHOS 57 07/14/2014 1338   AST 21 07/28/2014 0916   AST 17 07/14/2014 1338   ALT 12 07/28/2014 0916   ALT 12 07/14/2014 1338   BILITOT 0.9 07/28/2014 0916   BILITOT 1.47* 07/14/2014 1338       RADIOGRAPHIC STUDIES: No results found.   Patient had an MRI of her brain performed on 06/10/2014 that was read by Dr. Lynden Ang. The study revealed punctate focus of enhancement in the right cerebellum is no longer seen and there is the stable 5 mm enhancing nodule in the left cerebellar hemisphere.  ASSESSMENT AND PLAN: This is a very pleasant 64 years old white female with metastatic non-small cell lung cancer, adenocarcinoma status post induction chemotherapy with carboplatin and Alimta but unfortunately  had evidence for disease progression. She is currently undergoing immunotherapy with Nivolumab status post 14 cycles. The patient is doing very well with this treatment and she continues to have significant improvement in her symptoms. Patient was discussed with and also seen by Dr. Julien Nordmann. She will continue with her current treatment with immunotherapy consisting of  Nivolumab 3 mg/kg given every 2 weeks. She will proceed with cycle #15 today. She was encouraged to see her podiatrist for her heel pain.  After 1 more cycle, additional restaging scans will be performed. She will return in 2 weeks for labs, a follow up visit, and cycle #15 of treatment.   She understands and agrees with this plan. She has been encouraged to call with any issues that might arise before her next visit here.  Carlton Adam, PA-C 07/28/2014

## 2014-07-28 NOTE — Patient Instructions (Signed)

## 2014-07-31 NOTE — Patient Instructions (Signed)
Follow-up in 2 weeks

## 2014-08-08 ENCOUNTER — Telehealth: Payer: Self-pay | Admitting: Nurse Practitioner

## 2014-08-08 ENCOUNTER — Telehealth: Payer: Self-pay | Admitting: *Deleted

## 2014-08-08 NOTE — Telephone Encounter (Signed)
Spoke with patient. She would like to have her cholesterol level checked with her annual exam.  Advised that our office will not attempt to draw blood from her mediport.  Patient does have her mediport accessed q two weeks for flushing. Advised patient to discuss lab work with Sherri Stafford, [place] and provider can place orders and if patient advises nurse who flushes her port that she will need lab work drawn it can be drawn at that time but will need to let nurse know. Or can have labs drawn in this office peripherally by phlebotomist.  Patient states she would rather have blood draw from port.  Advised patient to discuss labs with Sherri Cage, FNP at time of Annual exam and lab orders can be placed.  Routing to provider for final review. Patient agreeable to disposition. Will close encounter

## 2014-08-08 NOTE — Telephone Encounter (Signed)
Pt has a port for cancer and wondering if there is someone here that can use that port for her blood work on Tuesday?

## 2014-08-08 NOTE — Telephone Encounter (Signed)
TC from patient asking about pain meds. She states she is having some mild pain in her L shoulder and L leg. Currently on Fentanyl patch 50 mcg every 3 days. She is asking if it is ok for her to take occasional Tramadol for breakthrough pain. She is allergic to Tylenol. Also takes Xarelto. No other prn pain meds listed. She has taken this before without problems.  Advised that this was ok but that if she did not notice any improvement in her pain to call back.  She voiced understanding.  No other needs identified. No prescriptions needed.

## 2014-08-09 ENCOUNTER — Encounter: Payer: Self-pay | Admitting: Nurse Practitioner

## 2014-08-09 ENCOUNTER — Ambulatory Visit (INDEPENDENT_AMBULATORY_CARE_PROVIDER_SITE_OTHER): Payer: BC Managed Care – PPO | Admitting: Nurse Practitioner

## 2014-08-09 VITALS — BP 132/94 | HR 80 | Ht 64.75 in | Wt 126.0 lb

## 2014-08-09 DIAGNOSIS — Z0000 Encounter for general adult medical examination without abnormal findings: Secondary | ICD-10-CM

## 2014-08-09 DIAGNOSIS — Z01419 Encounter for gynecological examination (general) (routine) without abnormal findings: Secondary | ICD-10-CM | POA: Diagnosis not present

## 2014-08-09 NOTE — Progress Notes (Signed)
Patient ID: Sherri Stafford, female   DOB: Aug 19, 1950, 64 y.o.   MRN: [id number] 64 y.o. G0P0 Divorced  Caucasian Fe here for annual exam.  Since last here new diagnosis of lung cancer with metasis to bone, brain and liver.  Sh is having chemotherapy and radiation.  She had PE few months ago.  Her ex husband lives with her as they have tried to work things out for his ETOH addiction.  He has had a bad accident in the interim and she has to drive them everywhere.  She has had some bad days and other family members have had to step in and help out.  She is having an increase in vaso symptoms and wants to restart HRT.  She wants [place] to send order for labs to [place] to get Lipids and Vit D checked.  Patient's last menstrual period was 01/14/2008.          Sexually active: No.  The current method of family planning is post menopausal status.    Exercising: Yes.    walking Smoker:  no  Health Maintenance: Pap:  06/06/11, negative with neg HR HPV MMG:  08/01/14, [facility], no report at time of visit Colonoscopy:  03/2004, repeat in 10 years BMD:   05/2011, -1.1/-1.8/-1.5 TDaP:  03/15/11 Shingles: 2015 Prevnar: not yet Labs:  [facility], will go next week   reports that she quit smoking about 30 years ago. Her smoking use included Cigarettes. She has a 14 pack-year smoking history. She has never used smokeless tobacco. She reports that she does not drink alcohol or use illicit drugs.  Past Medical History  Diagnosis Date  . Arthritis   . Bilateral ovarian cysts   . GERD (gastroesophageal reflux disease)   . Strain of hip flexor 06/2013    left side torn  . Cancer     lung ca  . Bone metastases     to left hip and spine  . Radiation     at [facility] to left hip, sacrum and brain  . Radiation 02/10/14-03/06/14    left central chest 35 gray    Past Surgical History  Procedure Laterality Date  . Dilation and curettage of uterus  2004    x 3   . Tubal ligation    . Colonoscopy  04/05/2004   normal     Current Outpatient Prescriptions  Medication Sig Dispense Refill  . albuterol (PROVENTIL HFA;VENTOLIN HFA) 108 (90 BASE) MCG/ACT inhaler Inhale 1-2 puffs into the lungs every 6 (six) hours as needed for wheezing or shortness of breath. 1 Inhaler 2  . bisacodyl (DULCOLAX) 10 MG suppository Place 1 suppository (10 mg total) rectally daily as needed for moderate constipation. 10 suppository 0  . calcium carbonate (OS-CAL) 1250 MG chewable tablet Chew 4 tablets by mouth daily.    . codeine 30 MG tablet Take 1 tablet (30 mg total) by mouth every 4 (four) hours as needed (cough). 80 tablet 0  . [facility] SENNA PLUS 8.6-50 MG per tablet   11  . diphenhydrAMINE (SOMINEX) 25 MG tablet Take 25 mg by mouth at bedtime as needed for sleep. Takes 2 tablets every 3-4 hours during the night.    . docusate sodium (COLACE) 100 MG capsule Take 100 mg by mouth 2 (two) times daily.    Marland Kitchen dronabinol (MARINOL) 2.5 MG capsule Take 1 capsule (2.5 mg total) by mouth 2 (two) times daily before a meal. 60 capsule 0  . esomeprazole (NEXIUM) 40  MG capsule Take 1 capsule (40 mg total) by mouth 2 (two) times daily before a meal. 60 capsule 0  . fentaNYL (DURAGESIC - DOSED MCG/HR) 50 MCG/HR Place 1 patch (50 mcg total) onto the skin every 3 (three) days. 10 patch 0  . glycerin adult 2 G SUPP Place 1 suppository rectally once as needed for moderate constipation.    . polyethylene glycol (MIRALAX / GLYCOLAX) packet Take 17 g by mouth daily as needed for moderate constipation.    . prochlorperazine (COMPAZINE) 10 MG tablet Take 1 tablet (10 mg total) by mouth every 6 (six) hours as needed for nausea or vomiting. 30 tablet 1  . rivaroxaban (XARELTO) 20 MG TABS tablet Take 1 tablet (20 mg total) by mouth daily with supper. 30 tablet 5  . traMADol (ULTRAM) 50 MG tablet TAKE 1 TABLET BY MOUTH EVERY 6 HOURS AS NEEDED 60 tablet 0   No current facility-administered medications for this visit.   Facility-Administered Medications  Ordered in Other Visits  Medication Dose Route Frequency Provider Last Rate Last Dose  . sodium chloride 0.9 % injection 10 mL  10 mL Intracatheter PRN Curt Bears, MD   10 mL at 04/07/14 1200    Family History  Problem Relation Age of Onset  . Brain cancer Father   . Emphysema Father     smoked  . Pancreatic cancer Mother   . Colon cancer Neg Hx   . Breast cancer Paternal Aunt 44    ROS:  Pertinent items are noted in HPI.  Otherwise, a comprehensive ROS was negative.  Exam:   BP 132/94 mmHg  Pulse 80  Ht 5' 4.75" (1.645 m)  Wt 126 lb (57.153 kg)  BMI 21.12 kg/m2  LMP 01/14/2008 Height: 5' 4.75" (164.5 cm) (with shoes) Ht Readings from Last 3 Encounters:  08/09/14 5' 4.75" (1.645 m)  07/28/14 '5\' 3"'$  (1.6 m)  07/14/14 '5\' 3"'$  (1.6 m)   (Weight 07/12/13 was 158 lbs)  General appearance: alert, cooperative and appears older that stated age.  She is thin, pale with obvious wt loss.  Head: Normocephalic, without obvious abnormality, atraumatic Neck: no adenopathy, supple, symmetrical, trachea midline and thyroid normal to inspection and palpation Lungs: scattered rhonchi right upper lobe, double port cath right upper chest wall Breasts: normal appearance, no masses or tenderness Heart: regular rate and rhythm Abdomen: soft, non-tender; no masses,  no organomegaly Extremities: extremities normal, atraumatic, no cyanosis or edema Skin: Skin color, texture, turgor normal. No rashes or lesions Lymph nodes: Cervical, supraclavicular, and axillary nodes normal. No abnormal inguinal nodes palpated Neurologic: Grossly normal   Pelvic: External genitalia:  no lesions              Urethra:  normal appearing urethra with no masses, tenderness or lesions              Bartholin's and Skene's: normal                 Vagina: normal appearing vagina with normal color and discharge, no lesions              Cervix: anteverted              Pap taken: No. 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  She is understanding and OK with plan.  Counseled on breast self exam, mammography screening, adequate intake of calcium and vitamin D, diet and exercise return annually or prn  An After Visit Summary was printed and given to the patient.

## 2014-08-09 NOTE — Patient Instructions (Signed)

## 2014-08-11 ENCOUNTER — Telehealth: Payer: Self-pay | Admitting: Physician Assistant

## 2014-08-11 ENCOUNTER — Ambulatory Visit (HOSPITAL_BASED_OUTPATIENT_CLINIC_OR_DEPARTMENT_OTHER): Payer: BC Managed Care – PPO

## 2014-08-11 ENCOUNTER — Encounter: Payer: Self-pay | Admitting: Physician Assistant

## 2014-08-11 ENCOUNTER — Ambulatory Visit: Payer: BC Managed Care – PPO

## 2014-08-11 ENCOUNTER — Other Ambulatory Visit: Payer: BC Managed Care – PPO

## 2014-08-11 ENCOUNTER — Other Ambulatory Visit: Payer: Self-pay | Admitting: Nurse Practitioner

## 2014-08-11 ENCOUNTER — Ambulatory Visit (HOSPITAL_BASED_OUTPATIENT_CLINIC_OR_DEPARTMENT_OTHER): Payer: BC Managed Care – PPO | Admitting: Physician Assistant

## 2014-08-11 VITALS — BP 131/70 | HR 105 | Temp 97.9°F | Resp 18 | Ht 64.75 in | Wt 124.8 lb

## 2014-08-11 DIAGNOSIS — Z5112 Encounter for antineoplastic immunotherapy: Secondary | ICD-10-CM

## 2014-08-11 DIAGNOSIS — M25511 Pain in right shoulder: Secondary | ICD-10-CM

## 2014-08-11 DIAGNOSIS — C3490 Malignant neoplasm of unspecified part of unspecified bronchus or lung: Secondary | ICD-10-CM

## 2014-08-11 DIAGNOSIS — Z95828 Presence of other vascular implants and grafts: Secondary | ICD-10-CM

## 2014-08-11 DIAGNOSIS — M25512 Pain in left shoulder: Secondary | ICD-10-CM | POA: Diagnosis not present

## 2014-08-11 DIAGNOSIS — C7951 Secondary malignant neoplasm of bone: Secondary | ICD-10-CM

## 2014-08-11 DIAGNOSIS — C3432 Malignant neoplasm of lower lobe, left bronchus or lung: Secondary | ICD-10-CM

## 2014-08-11 DIAGNOSIS — C7931 Secondary malignant neoplasm of brain: Secondary | ICD-10-CM | POA: Diagnosis not present

## 2014-08-11 LAB — VITAMIN D 25 HYDROXY (VIT D DEFICIENCY, FRACTURES): VIT D 25 HYDROXY: 15 ng/mL — AB (ref 30–100)

## 2014-08-11 LAB — CBC WITH DIFFERENTIAL/PLATELET
BASO%: 0.8 % (ref 0.0–2.0)
Basophils Absolute: 0 10*3/uL (ref 0.0–0.1)
EOS ABS: 0.4 10*3/uL (ref 0.0–0.5)
EOS%: 7 % (ref 0.0–7.0)
HCT: 33.9 % — ABNORMAL LOW (ref 34.8–46.6)
HGB: 11.2 g/dL — ABNORMAL LOW (ref 11.6–15.9)
LYMPH#: 0.6 10*3/uL — AB (ref 0.9–3.3)
LYMPH%: 10.7 % — ABNORMAL LOW (ref 14.0–49.7)
MCH: 27.2 pg (ref 25.1–34.0)
MCHC: 33 g/dL (ref 31.5–36.0)
MCV: 82.2 fL (ref 79.5–101.0)
MONO#: 0.4 10*3/uL (ref 0.1–0.9)
MONO%: 7.6 % (ref 0.0–14.0)
NEUT%: 73.9 % (ref 38.4–76.8)
NEUTROS ABS: 4.3 10*3/uL (ref 1.5–6.5)
Platelets: 255 10*3/uL (ref 145–400)
RBC: 4.12 10*6/uL (ref 3.70–5.45)
RDW: 15.5 % — AB (ref 11.2–14.5)
WBC: 5.8 10*3/uL (ref 3.9–10.3)

## 2014-08-11 LAB — COMPREHENSIVE METABOLIC PANEL (CC13)
ALK PHOS: 57 U/L (ref 40–150)
ALT: 10 U/L (ref 0–55)
ANION GAP: 13 meq/L — AB (ref 3–11)
AST: 16 U/L (ref 5–34)
Albumin: 3.6 g/dL (ref 3.5–5.0)
BILIRUBIN TOTAL: 1.11 mg/dL (ref 0.20–1.20)
BUN: 13.7 mg/dL (ref 7.0–26.0)
CO2: 21 mEq/L — ABNORMAL LOW (ref 22–29)
Calcium: 9 mg/dL (ref 8.4–10.4)
Chloride: 106 mEq/L (ref 98–109)
Creatinine: 0.8 mg/dL (ref 0.6–1.1)
EGFR: 78 mL/min/{1.73_m2} — AB (ref >90)
GLUCOSE: 105 mg/dL (ref 70–140)
POTASSIUM: 4.2 meq/L (ref 3.5–5.1)
Sodium: 141 mEq/L (ref 136–145)
Total Protein: 6.8 g/dL (ref 6.4–8.3)

## 2014-08-11 LAB — LIPID PANEL
Cholesterol: 178 mg/dL (ref 0–200)
HDL: 50 mg/dL (ref >=46)
LDL CALC: 110 mg/dL — AB (ref 0–99)
Total CHOL/HDL Ratio: 3.6 Ratio
Triglycerides: 91 mg/dL (ref <150)
VLDL: 18 mg/dL (ref 0–40)

## 2014-08-11 MED ORDER — SODIUM CHLORIDE 0.9 % IJ SOLN
10.0000 mL | INTRAMUSCULAR | Status: DC | PRN
  Administered 2014-08-11: 10 mL
  Filled 2014-08-11: qty 10

## 2014-08-11 MED ORDER — SODIUM CHLORIDE 0.9 % IV SOLN
Freq: Once | INTRAVENOUS | Status: AC
  Administered 2014-08-11: 11:00:00 via INTRAVENOUS

## 2014-08-11 MED ORDER — HEPARIN SOD (PORK) LOCK FLUSH 100 UNIT/ML IV SOLN
500.0000 [IU] | Freq: Once | INTRAVENOUS | Status: AC | PRN
  Administered 2014-08-11: 500 [IU]
  Filled 2014-08-11: qty 5

## 2014-08-11 MED ORDER — NIVOLUMAB CHEMO INJECTION 100 MG/10ML
3.0000 mg/kg | Freq: Once | INTRAVENOUS | Status: AC
  Administered 2014-08-11: 180 mg via INTRAVENOUS
  Filled 2014-08-11: qty 18

## 2014-08-11 MED ORDER — FENTANYL 50 MCG/HR TD PT72: 50.0000 ug | MEDICATED_PATCH | TRANSDERMAL | Status: DC

## 2014-08-11 MED ORDER — SODIUM CHLORIDE 0.9 % IJ SOLN
10.0000 mL | INTRAMUSCULAR | Status: DC | PRN
  Administered 2014-08-11: 10 mL via INTRAVENOUS
  Filled 2014-08-11: qty 10

## 2014-08-11 NOTE — Telephone Encounter (Addendum)
Gave avs & calendar for May. Sent message treatment. Gave contrast for scan.

## 2014-08-11 NOTE — Patient Instructions (Signed)

## 2014-08-11 NOTE — Progress Notes (Addendum)
Sherri Stafford Telephone:([phone]) [phone]   Fax:([phone]) [phone]  OFFICE PROGRESS NOTE  Sherri Downing, MD [place] [address]  DIAGNOSIS: stage IV (T2a, N3, M1b) non-small cell lung cancer, adenocarcinoma presented with large left lower lobe lung mass in addition to bilateral pulmonary nodules and mediastinal and supraclavicular lymphadenopathy diagnosed in May of 2015.  Genomic Alterations Identified? ERBB3 amplification CDK4 amplification IDH2 R172S KRAS G12D MDM2 amplification RBM10 G482f*36 TERC amplification - equivocal? Additional Disease-relevant Genes with No Reportable Alterations Identified? RET ALK BRAF ERBB2 MET EGFR  PRIOR THERAPY:  1) status post palliative [facility] to the left [facility]  status post stereotactic radiotherapy to brain lesions [place] the [place] 2) Systemic chemotherapy with carboplatin for AUC of 5, Alimta 500 mg/M2 and Avastin 15 mg/KG every 3 weeks, status post 5 cycles. [facility]  CURRENT THERAPY:  1) immunotherapy with Nivolmab at 3 mg/kg given every 3 weeks. Status post 15 cycles  2) Xgeva 120 mcg subcutaneously every 2 months.  INTERVAL HISTORY: SSHAYNA EBLEN64y.o. female returns to the clinic today for follow-up visit, accompanied by her boyfriend. She is feeling well today. She continues to walk for a mile and a half to two miles with her walker and a half mile without the walker. She complains of pain in the left side of her neck extending to her left arm  As well as some discomfort in her left groin area. Both "pains" feel muscular in nature. She has been taking Ultram with some relief. She continues to eat and drink well. She voices no other specific complaints today.  She  denies shortness of breath, chest pain, cough, or hemoptysis.   MEDICAL HISTORY: Past Medical History  Diagnosis Date  . Arthritis   . Bilateral ovarian cysts   . GERD (gastroesophageal reflux disease)   . Strain of hip flexor 06/2013    left side torn  . Cancer     lung ca  . Bone metastases     to left hip and spine  . Radiation     at [facility] to left hip, sacrum and brain  . Radiation 02/10/14-03/06/14    left central chest 35 gray    ALLERGIES:  is allergic to other; ketoconazole; lorazepam; morphine and related; nystatin; sporanox; tylenol; vitamin d; and zofran.  MEDICATIONS:  Current Outpatient Prescriptions  Medication Sig Dispense Refill  . bisacodyl (DULCOLAX) 10 MG suppository Place 1 suppository (10 mg total) rectally daily as needed for moderate constipation. 10 suppository 0  . calcium carbonate (OS-CAL) 1250 MG chewable tablet Chew 4 tablets by mouth daily.    . diphenhydrAMINE (SOMINEX) 25 MG tablet Take 25 mg by mouth at bedtime as needed for sleep. Takes 2 tablets every 3-4 hours during the night.    . dronabinol (MARINOL) 2.5 MG capsule Take 1 capsule (2.5 mg total) by mouth 2 (two) times daily before a meal. 60 capsule 0  . esomeprazole (NEXIUM) 40 MG capsule Take 1 capsule (40 mg total) by mouth 2 (two) times daily before a meal. 60 capsule 0  . fentaNYL (DURAGESIC - DOSED MCG/HR) 50 MCG/HR Place 1 patch (50 mcg total) onto the skin every 3 (three) days. 10 patch 0  . glycerin adult 2 G  SUPP Place 1 suppository rectally once as needed for moderate constipation.    . prochlorperazine (COMPAZINE) 10 MG tablet Take 1 tablet (10 mg total) by mouth every 6 (six) hours as needed for nausea or vomiting. 30 tablet 1  . rivaroxaban (XARELTO) 20 MG TABS tablet Take 1 tablet (20 mg total) by mouth daily with supper. 30 tablet 5  . traMADol (ULTRAM) 50 MG tablet TAKE 1 TABLET BY MOUTH EVERY 6 HOURS AS NEEDED 60 tablet 0  . albuterol (PROVENTIL HFA;VENTOLIN HFA) 108 (90 BASE)  MCG/ACT inhaler Inhale 1-2 puffs into the lungs every 6 (six) hours as needed for wheezing or shortness of breath. (Patient not taking: Reported on 08/11/2014) 1 Inhaler 2  . codeine 30 MG tablet Take 1 tablet (30 mg total) by mouth every 4 (four) hours as needed (cough). (Patient not taking: Reported on 08/11/2014) 80 tablet 0  . [employer] SENNA PLUS 8.6-50 MG per tablet   11  . docusate sodium (COLACE) 100 MG capsule Take 100 mg by mouth 2 (two) times daily.    . polyethylene glycol (MIRALAX / GLYCOLAX) packet Take 17 g by mouth daily as needed for moderate constipation.     No current facility-administered medications for this visit.   Facility-Administered Medications Ordered in Other Visits  Medication Dose Route Frequency Provider Last Rate Last Dose  . sodium chloride 0.9 % injection 10 mL  10 mL Intracatheter PRN Sherri Bears, MD   10 mL at 04/07/14 1200  . sodium chloride 0.9 % injection 10 mL  10 mL Intracatheter PRN Sherri Bears, MD   10 mL at 08/11/14 1239    SURGICAL HISTORY:  Past Surgical History  Procedure Laterality Date  . Dilation and curettage of uterus  2004    x 3   . Tubal ligation    . Colonoscopy  04/05/2004    normal     REVIEW OF SYSTEMS:  Constitutional: negative Eyes: negative Ears, nose, mouth, throat, and face: negative Respiratory: negative Cardiovascular: negative Gastrointestinal: positive for constipation Genitourinary:negative Integument/breast: negative Hematologic/lymphatic: negative Musculoskeletal:positive for back pain, myalgias and neck pain Neurological: negative Behavioral/Psych: negative Endocrine: negative Allergic/Immunologic: negative   PHYSICAL EXAMINATION: General appearance: alert, cooperative and no distress Head: Normocephalic, without obvious abnormality, atraumatic Neck: no adenopathy, no JVD, supple, symmetrical, trachea midline and thyroid not enlarged, symmetric, no tenderness/mass/nodules Lymph nodes: Cervical,  supraclavicular, and axillary nodes normal. Resp: clear to auscultation bilaterally Back: symmetric, no curvature. ROM normal. No CVA tenderness. Cardio: regular rate and rhythm, S1, S2 normal, no murmur, click, rub or gallop GI: soft, non-tender; bowel sounds normal; no masses,  no organomegaly Extremities: extremities normal, atraumatic, no cyanosis or edema  ECOG PERFORMANCE STATUS: 1 - Symptomatic but completely ambulatory  Blood pressure 131/70, pulse 105, temperature 97.9 F (36.6 C), temperature source Oral, resp. rate 18, height 5' 4.75" (1.645 m), weight 124 lb 12.8 oz (56.609 kg), last menstrual period 01/14/2008, SpO2 100 %.  LABORATORY DATA: Lab Results  Component Value Date   WBC 5.8 08/11/2014   HGB 11.2* 08/11/2014   HCT 33.9* 08/11/2014   MCV 82.2 08/11/2014   PLT 255 08/11/2014      Chemistry      Component Value Date/Time   NA 141 08/11/2014 0832   NA 137 07/28/2014 0916   K 4.2 08/11/2014 0832   K 4.4 07/28/2014 0916   CL 104 07/28/2014 0916   CO2 21* 08/11/2014 0832   CO2 28 07/28/2014 0916   BUN 13.7 08/11/2014  0832   BUN 13 07/28/2014 0916   CREATININE 0.8 08/11/2014 0832   CREATININE 0.85 07/28/2014 0916   CREATININE 0.83 09/07/2013 1506      Component Value Date/Time   CALCIUM 9.0 08/11/2014 0832   CALCIUM 8.8 07/28/2014 0916   ALKPHOS 57 08/11/2014 0832   ALKPHOS 49 07/28/2014 0916   AST 16 08/11/2014 0832   AST 21 07/28/2014 0916   ALT 10 08/11/2014 0832   ALT 12 07/28/2014 0916   BILITOT 1.11 08/11/2014 0832   BILITOT 0.9 07/28/2014 0916       RADIOGRAPHIC STUDIES: No results found.   Patient had an MRI of her brain performed on 06/10/2014 that was read by Dr. Lynden Ang. The study revealed punctate focus of enhancement in the right cerebellum is no longer seen and there is the stable 5 mm enhancing nodule in the left cerebellar hemisphere.  ASSESSMENT AND PLAN: This is a very pleasant 64 years old white female with metastatic  non-small cell lung cancer, adenocarcinoma status post induction chemotherapy with carboplatin and Alimta but unfortunately had evidence for disease progression. She is currently undergoing immunotherapy with Nivolumab status post 15 cycles. The patient is doing very well with this treatment and she continues to have significant improvement in her symptoms. Patient was discussed with and also seen by Dr. Julien Nordmann. She will continue with her current treatment with immunotherapy consisting of  Nivolumab 3 mg/kg given every 2 weeks. She will proceed with cycle #16 today. She will continue symptomatic treatment with local heat and Ultram. She will monitor these symptoms closely.  She will return in 2 weeks for labs, a follow up visit with a restaging CT scan of her neck, chest, abdomen and pelvis with contrast to re-evaluate her disease, and cycle #17 of treatment.   She understands and agrees with this plan. She has been encouraged to call with any issues that might arise before her next visit here.  Carlton Adam, PA-C 08/11/2014  ADDENDUM:  Hematology/Oncology Attending:  I had a face to face encounter with the patient. I recommended her care plan. This is a very pleasant 64 years old white female with stage IV non-small cell lung cancer, adenocarcinoma status post induction chemotherapy with carboplatin and Alimta with disease progression. The patient is currently on treatment with immunotherapy with Nivolumab status post 15 cycles and tolerating her treatment fairly well. She continues to gain strength and stamina and currently exercises regularly. She has mild bilateral shoulder pain started recently. I recommended for the patient to proceed with cycle #16 today as a scheduled. We will see her back for follow-up visit in 2 weeks for reevaluation after repeating CT scan of the neck, chest, abdomen and pelvis for restaging of her disease. The patient was advised to call immediately if she has any  concerning symptoms in the interval.  Disclaimer: This note was dictated with voice recognition software. Similar sounding words can inadvertently be transcribed and may be missed upon review. Sherri Kempf., MD 08/14/2014

## 2014-08-11 NOTE — Progress Notes (Signed)
Encounter reviewed by Dr. Brook Silva.  

## 2014-08-11 NOTE — Patient Instructions (Signed)
[facility] Discharge Instructions for Patients Receiving Chemotherapy  Today you received the following chemotherapy agents: Nivolumab.  To help prevent nausea and vomiting after your treatment, we encourage you to take your nausea medication: Compazine. Take one every 6 hour as needed.   If you develop nausea and vomiting that is not controlled by your nausea medication, call the clinic.   BELOW ARE SYMPTOMS THAT SHOULD BE REPORTED IMMEDIATELY:  *FEVER GREATER THAN 100.5 F  *CHILLS WITH OR WITHOUT FEVER  NAUSEA AND VOMITING THAT IS NOT CONTROLLED WITH YOUR NAUSEA MEDICATION  *UNUSUAL SHORTNESS OF BREATH  *UNUSUAL BRUISING OR BLEEDING  TENDERNESS IN MOUTH AND THROAT WITH OR WITHOUT PRESENCE OF ULCERS  *URINARY PROBLEMS  *BOWEL PROBLEMS  UNUSUAL RASH Items with * indicate a potential emergency and should be followed up as soon as possible.  Feel free to call the clinic should you have any questions or concerns. The clinic phone number is [phone].  Please show the [facility] at check-in to the Emergency Department and triage nurse.

## 2014-08-12 ENCOUNTER — Telehealth: Payer: Self-pay | Admitting: Internal Medicine

## 2014-08-12 NOTE — Telephone Encounter (Signed)
returned call and lvm for pt regarding to May appt....confirmed appt

## 2014-08-13 NOTE — Patient Instructions (Signed)
Follow up in 2 weeks with a re-staging CT scan of your neck, chest abdomen and pelvis to re-evaluate your disease

## 2014-08-15 ENCOUNTER — Telehealth: Payer: Self-pay | Admitting: Internal Medicine

## 2014-08-15 NOTE — Telephone Encounter (Signed)
Returned patient call and left message confirming both appointments for 5/12 and 5/26.

## 2014-08-16 ENCOUNTER — Telehealth: Payer: Self-pay | Admitting: Internal Medicine

## 2014-08-16 NOTE — Telephone Encounter (Signed)
Patient continues to call re confirming times for 5/26 appointments. Returned call yesterday and today. Confirming appointments for 5/12 and 5/26. Today in voicemail patient given dates and times of each appointment on 5/12 and 5/26. 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 Schedule mailed.

## 2014-08-17 ENCOUNTER — Other Ambulatory Visit: Payer: Self-pay | Admitting: *Deleted

## 2014-08-17 DIAGNOSIS — Z0000 Encounter for general adult medical examination without abnormal findings: Secondary | ICD-10-CM

## 2014-08-18 ENCOUNTER — Telehealth: Payer: Self-pay | Admitting: Nurse Practitioner

## 2014-08-18 NOTE — Telephone Encounter (Signed)
Patient calling requesting her recent pap results from the nurse personally.

## 2014-08-18 NOTE — Telephone Encounter (Signed)
Spoke with patient. Advised I have reviewed OV notes from 08/09/2014 and it appears pap smear was not taken at this appointment. Patient states "I am almost 100 percent sure she took one because I had not had one in 3 years. She has a little container that she put a swab in." Advised patient will need to check with Milford Cage, FNP regarding pap and return call. Patient is agreeable.

## 2014-08-19 NOTE — Telephone Encounter (Signed)
I will be happy to see her for pap only as this was my fault for not collecting the pap.  Only charge would be for her pap, no OV charge.

## 2014-08-19 NOTE — Telephone Encounter (Signed)
Spoke with patient. Advised patient of message as seen below from Sherri Stafford, [place]. Patient is agreeable. Appointment scheduled for 5/10 at 10am with Sherri Stafford, [place]. Patient is agreeable to date and time.  Routing to provider for final review. Patient agreeable to disposition. Patient aware provider will review message and nurse will return call with any additional instructions or change of disposition. Will close encounter.

## 2014-08-22 ENCOUNTER — Encounter (HOSPITAL_COMMUNITY): Payer: Self-pay

## 2014-08-22 ENCOUNTER — Telehealth: Payer: Self-pay | Admitting: Nurse Practitioner

## 2014-08-22 ENCOUNTER — Ambulatory Visit (HOSPITAL_COMMUNITY)
Admission: RE | Admit: 2014-08-22 | Discharge: 2014-08-22 | Disposition: A | Discharge status: Home / Self Care | Payer: BC Managed Care – PPO | Source: Ambulatory Visit | Attending: Physician Assistant | Admitting: Physician Assistant

## 2014-08-22 ENCOUNTER — Encounter: Payer: Self-pay | Admitting: Gastroenterology

## 2014-08-22 DIAGNOSIS — Z79899 Other long term (current) drug therapy: Secondary | ICD-10-CM | POA: Insufficient documentation

## 2014-08-22 DIAGNOSIS — C3490 Malignant neoplasm of unspecified part of unspecified bronchus or lung: Secondary | ICD-10-CM

## 2014-08-22 DIAGNOSIS — C7951 Secondary malignant neoplasm of bone: Secondary | ICD-10-CM | POA: Diagnosis present

## 2014-08-22 MED ORDER — IOHEXOL 300 MG/ML  SOLN
100.0000 mL | Freq: Once | INTRAMUSCULAR | Status: AC | PRN
  Administered 2014-08-22: 100 mL via INTRAVENOUS

## 2014-08-22 NOTE — Telephone Encounter (Signed)
Provider cx. Left voicemail for pt to cb and rs. Pts appt for pap.

## 2014-08-23 ENCOUNTER — Ambulatory Visit: Payer: BC Managed Care – PPO | Admitting: Nurse Practitioner

## 2014-08-25 ENCOUNTER — Ambulatory Visit: Payer: BC Managed Care – PPO

## 2014-08-25 ENCOUNTER — Ambulatory Visit (HOSPITAL_BASED_OUTPATIENT_CLINIC_OR_DEPARTMENT_OTHER): Payer: BC Managed Care – PPO | Admitting: Internal Medicine

## 2014-08-25 ENCOUNTER — Telehealth: Payer: Self-pay | Admitting: *Deleted

## 2014-08-25 ENCOUNTER — Telehealth: Payer: Self-pay | Admitting: Internal Medicine

## 2014-08-25 ENCOUNTER — Other Ambulatory Visit (HOSPITAL_BASED_OUTPATIENT_CLINIC_OR_DEPARTMENT_OTHER): Payer: BC Managed Care – PPO

## 2014-08-25 VITALS — BP 145/79 | HR 98 | Temp 97.9°F | Resp 18 | Ht 64.75 in | Wt 123.7 lb

## 2014-08-25 DIAGNOSIS — R109 Unspecified abdominal pain: Secondary | ICD-10-CM

## 2014-08-25 DIAGNOSIS — C3432 Malignant neoplasm of lower lobe, left bronchus or lung: Secondary | ICD-10-CM

## 2014-08-25 DIAGNOSIS — Z95828 Presence of other vascular implants and grafts: Secondary | ICD-10-CM

## 2014-08-25 DIAGNOSIS — R591 Generalized enlarged lymph nodes: Secondary | ICD-10-CM | POA: Diagnosis not present

## 2014-08-25 DIAGNOSIS — M25552 Pain in left hip: Secondary | ICD-10-CM

## 2014-08-25 DIAGNOSIS — C3490 Malignant neoplasm of unspecified part of unspecified bronchus or lung: Secondary | ICD-10-CM

## 2014-08-25 DIAGNOSIS — Z79899 Other long term (current) drug therapy: Secondary | ICD-10-CM

## 2014-08-25 DIAGNOSIS — G893 Neoplasm related pain (acute) (chronic): Secondary | ICD-10-CM

## 2014-08-25 DIAGNOSIS — M549 Dorsalgia, unspecified: Secondary | ICD-10-CM

## 2014-08-25 DIAGNOSIS — C7951 Secondary malignant neoplasm of bone: Secondary | ICD-10-CM | POA: Diagnosis not present

## 2014-08-25 DIAGNOSIS — M25512 Pain in left shoulder: Secondary | ICD-10-CM

## 2014-08-25 DIAGNOSIS — C7931 Secondary malignant neoplasm of brain: Secondary | ICD-10-CM | POA: Diagnosis not present

## 2014-08-25 LAB — COMPREHENSIVE METABOLIC PANEL (CC13)
ALBUMIN: 3.5 g/dL (ref 3.5–5.0)
ALK PHOS: 66 U/L (ref 40–150)
ALT: 8 U/L (ref 0–55)
AST: 13 U/L (ref 5–34)
Anion Gap: 8 mEq/L (ref 3–11)
BILIRUBIN TOTAL: 0.8 mg/dL (ref 0.20–1.20)
BUN: 10.6 mg/dL (ref 7.0–26.0)
CO2: 25 mEq/L (ref 22–29)
Calcium: 8.4 mg/dL (ref 8.4–10.4)
Chloride: 104 mEq/L (ref 98–109)
Creatinine: 0.8 mg/dL (ref 0.6–1.1)
EGFR: 82 mL/min/{1.73_m2} — AB (ref >90)
GLUCOSE: 88 mg/dL (ref 70–140)
POTASSIUM: 4.2 meq/L (ref 3.5–5.1)
SODIUM: 137 meq/L (ref 136–145)
Total Protein: 6.8 g/dL (ref 6.4–8.3)

## 2014-08-25 LAB — CBC WITH DIFFERENTIAL/PLATELET
BASO%: 0.6 % (ref 0.0–2.0)
Basophils Absolute: 0 10*3/uL (ref 0.0–0.1)
EOS ABS: 0.3 10*3/uL (ref 0.0–0.5)
EOS%: 5.6 % (ref 0.0–7.0)
HCT: 34.1 % — ABNORMAL LOW (ref 34.8–46.6)
HGB: 11.2 g/dL — ABNORMAL LOW (ref 11.6–15.9)
LYMPH%: 11.4 % — AB (ref 14.0–49.7)
MCH: 27.7 pg (ref 25.1–34.0)
MCHC: 32.8 g/dL (ref 31.5–36.0)
MCV: 84.4 fL (ref 79.5–101.0)
MONO#: 0.4 10*3/uL (ref 0.1–0.9)
MONO%: 7.6 % (ref 0.0–14.0)
NEUT#: 3.9 10*3/uL (ref 1.5–6.5)
NEUT%: 74.8 % (ref 38.4–76.8)
PLATELETS: 187 10*3/uL (ref 145–400)
RBC: 4.04 10*6/uL (ref 3.70–5.45)
RDW: 14.8 % — ABNORMAL HIGH (ref 11.2–14.5)
WBC: 5.2 10*3/uL (ref 3.9–10.3)
lymph#: 0.6 10*3/uL — ABNORMAL LOW (ref 0.9–3.3)

## 2014-08-25 LAB — TSH CHCC: TSH: 3.026 m(IU)/L (ref 0.308–3.960)

## 2014-08-25 MED ORDER — SODIUM CHLORIDE 0.9 % IJ SOLN
10.0000 mL | INTRAMUSCULAR | Status: DC | PRN
  Administered 2014-08-25: 10 mL via INTRAVENOUS
  Filled 2014-08-25: qty 10

## 2014-08-25 MED ORDER — AFATINIB DIMALEATE 40 MG PO TABS: 40.0000 mg | ORAL_TABLET | Freq: Every day | ORAL | Status: DC

## 2014-08-25 NOTE — Telephone Encounter (Signed)
[place] faxed Prior authorization request for Gilotrif 40 mg tablets.  Request to [employer] for review.

## 2014-08-25 NOTE — Telephone Encounter (Signed)
Gave and printed appt sched and avs for pt for June....emailed Santiago Glad to sched radiation appt

## 2014-08-25 NOTE — Patient Instructions (Signed)

## 2014-08-25 NOTE — Progress Notes (Signed)
[place] [phone]   [phone]  OFFICE PROGRESS NOTE  [name], MD [place] [address]  DIAGNOSIS: stage IV (T2a, N3, M1b) non-small cell lung cancer, adenocarcinoma presented with large left lower lobe lung mass in addition to bilateral pulmonary nodules and mediastinal and supraclavicular lymphadenopathy diagnosed in May of 2015.  Genomic Alterations Identified? ERBB3 amplification CDK4 amplification IDH2 R172S KRAS G12D MDM2 amplification RBM10 [id number] TERC amplification - equivocal? Additional Disease-relevant Genes with No Reportable Alterations Identified? RET ALK BRAF ERBB2 MET EGFR  PRIOR THERAPY:  1) status post palliative radiotherapy to the left [facility]  status post stereotactic radiotherapy to brain lesions [place] 2) Systemic chemotherapy with carboplatin for AUC of 5, Alimta 500 mg/M2 and Avastin 15 mg/KG every 3 weeks, status post 5 cycles. [facility] 3)  immunotherapy with Nivolmab at 3 mg/kg given every 3 weeks. Status post 16 cycles discontinued today secondary to disease progression.  CURRENT THERAPY:  1) Gilotrif 40 mg by mouth daily for a patient with positive ERBB3 amplification. First dose is expected to start in the next few days pending insurance coverage. 2) Xgeva 120 mcg subcutaneously every 2 months.  INTERVAL HISTORY: Sherri [id number].o. female returns to the clinic today for follow-up visit accompanied by her boyfriend. The patient is feeling well today except for left shoulder pain as well as left hip pain started over the last 2 weeks. The left shoulder pain radiated to the left arm. She is currently on fentanyl patch and tramadol for  pain. She has no fever or chills, no nausea or vomiting. She denied having any significant chest pain, shortness of breath, cough or hemoptysis. She is tolerating her current treatment with immunotherapy with Nivolumab fairly well with no significant adverse effects. She had repeat CT scan of the chest, abdomen and pelvis performed recently and she is here for evaluation and discussion of the scan results.  MEDICAL HISTORY: Past Medical History  Diagnosis Date  . Arthritis   . Bilateral ovarian cysts   . GERD (gastroesophageal reflux disease)   . Strain of hip flexor 06/2013    left side torn  . Radiation     at [facility] to left hip, sacrum and brain  . Radiation 02/10/14-03/06/14    left central chest 35 gray  . Cancer     lung ca  . Bone metastases     to left hip and spine    ALLERGIES:  is allergic to other; ketoconazole; lorazepam; morphine and related; nystatin; sporanox; tylenol; vitamin d; and zofran.  MEDICATIONS:  Current Outpatient Prescriptions  Medication Sig Dispense Refill  . albuterol (PROVENTIL HFA;VENTOLIN HFA) 108 (90 BASE) MCG/ACT inhaler Inhale 1-2 puffs into the lungs every 6 (six) hours as needed for wheezing or shortness of breath. 1 Inhaler 2  . bisacodyl (DULCOLAX) 10 MG suppository Place 1 suppository (10 mg total) rectally daily as needed for moderate constipation. 10 suppository 0  . calcium carbonate (OS-CAL) 1250 MG chewable tablet Chew 4 tablets by mouth daily.    . codeine 30 MG tablet Take 1 tablet (30 mg total) by mouth every 4 (four) hours as needed (cough). 80 tablet 0  . [employer] SENNA PLUS 8.6-50 MG per  tablet   11  . diphenhydrAMINE (SOMINEX) 25 MG tablet Take 25 mg by mouth at bedtime as needed for sleep. Takes 2 tablets every 3-4 hours during the night.    . docusate sodium (COLACE) 100 MG capsule Take 100 mg by mouth 2 (two) times daily.    Marland Kitchen dronabinol (MARINOL) 2.5 MG capsule Take 1 capsule (2.5 mg total) by mouth 2 (two) times daily before a meal.  60 capsule 0  . esomeprazole (NEXIUM) 40 MG capsule Take 1 capsule (40 mg total) by mouth 2 (two) times daily before a meal. 60 capsule 0  . fentaNYL (DURAGESIC - DOSED MCG/HR) 50 MCG/HR Place 1 patch (50 mcg total) onto the skin every 3 (three) days. 10 patch 0  . glycerin adult 2 G SUPP Place 1 suppository rectally once as needed for moderate constipation.    . polyethylene glycol (MIRALAX / GLYCOLAX) packet Take 17 g by mouth daily as needed for moderate constipation.    . prochlorperazine (COMPAZINE) 10 MG tablet Take 1 tablet (10 mg total) by mouth every 6 (six) hours as needed for nausea or vomiting. 30 tablet 1  . rivaroxaban (XARELTO) 20 MG TABS tablet Take 1 tablet (20 mg total) by mouth daily with supper. 30 tablet 5  . traMADol (ULTRAM) 50 MG tablet TAKE 1 TABLET BY MOUTH EVERY 6 HOURS AS NEEDED 60 tablet 0   No current facility-administered medications for this visit.   Facility-Administered Medications Ordered in Other Visits  Medication Dose Route Frequency Provider Last Rate Last Dose  . sodium chloride 0.9 % injection 10 mL  10 mL Intracatheter PRN Curt Bears, MD   10 mL at 04/07/14 1200    SURGICAL HISTORY:  Past Surgical History  Procedure Laterality Date  . Dilation and curettage of uterus  2004    x 3   . Tubal ligation    . Colonoscopy  04/05/2004    normal     REVIEW OF SYSTEMS:  Constitutional: negative Eyes: negative Ears, nose, mouth, throat, and face: negative Respiratory: negative Cardiovascular: negative Gastrointestinal: negative Genitourinary:negative Integument/breast: negative Hematologic/lymphatic: negative Musculoskeletal:positive for bone pain and neck pain Neurological: negative Behavioral/Psych: negative Endocrine: negative Allergic/Immunologic: negative   PHYSICAL EXAMINATION: General appearance: alert, cooperative, fatigued and no distress Head: Normocephalic, without obvious abnormality, atraumatic Neck: no adenopathy, no JVD,  supple, symmetrical, trachea midline and thyroid not enlarged, symmetric, no tenderness/mass/nodules Lymph nodes: Cervical, supraclavicular, and axillary nodes normal. Resp: clear to auscultation bilaterally Back: symmetric, no curvature. ROM normal. No CVA tenderness. Cardio: regular rate and rhythm, S1, S2 normal, no murmur, click, rub or gallop GI: soft, non-tender; bowel sounds normal; no masses,  no organomegaly Extremities: extremities normal, atraumatic, no cyanosis or edema Neurologic: Alert and oriented X 3, normal strength and tone. Normal symmetric reflexes. Normal coordination and gait  ECOG PERFORMANCE STATUS: 1 - Symptomatic but completely ambulatory  Blood pressure 145/79, pulse 98, temperature 97.9 F (36.6 C), temperature source Oral, resp. rate 18, height 5' 4.75" (1.645 m), weight 123 lb 11.2 oz (56.11 kg), last menstrual period 01/14/2008, SpO2 100 %.  LABORATORY DATA: Lab Results  Component Value Date   WBC 5.2 08/25/2014   HGB 11.2* 08/25/2014   HCT 34.1* 08/25/2014   MCV 84.4 08/25/2014   PLT 187 08/25/2014      Chemistry      Component Value Date/Time   NA 137 08/25/2014 0925   NA 137 07/28/2014 0916   K 4.2 08/25/2014 0925   K 4.4  07/28/2014 0916   CL 104 07/28/2014 0916   CO2 25 08/25/2014 0925   CO2 28 07/28/2014 0916   BUN 10.6 08/25/2014 0925   BUN 13 07/28/2014 0916   CREATININE 0.8 08/25/2014 0925   CREATININE 0.85 07/28/2014 0916   CREATININE 0.83 09/07/2013 1506      Component Value Date/Time   CALCIUM 8.4 08/25/2014 0925   CALCIUM 8.8 07/28/2014 0916   ALKPHOS 66 08/25/2014 0925   ALKPHOS 49 07/28/2014 0916   AST 13 08/25/2014 0925   AST 21 07/28/2014 0916   ALT 8 08/25/2014 0925   ALT 12 07/28/2014 0916   BILITOT 0.80 08/25/2014 0925   BILITOT 0.9 07/28/2014 0916       RADIOGRAPHIC STUDIES: Ct Soft Tissue Neck W Contrast  08/22/2014   CLINICAL DATA:  Lung cancer diagnosed 08/25/2013. Metastatic disease to bone. Chemotherapy in  progress. Radiation therapy complete 2015. Subsequent treatment strategy.  EXAM: CT OF THE NECK WITH CONTRAST  CT OF THE CHEST WITH CONTRAST  CT OF THE ABDOMEN AND PELVIS WITH CONTRAST  TECHNIQUE: Multidetector CT imaging of the neck was performed with intravenous contrast.; Multidetector CT imaging of the abdomen and pelvis was performed following the standard protocol during bolus administration of intravenous contrast.; Multidetector CT imaging of the chest was performed following the standard protocol during bolus administration of intravenous contrast.  CONTRAST:  145m OMNIPAQUE IOHEXOL 300 MG/ML  SOLN  COMPARISON:  CT 06/27/2014, PET-CT 09/09/2013  FINDINGS: CT NECK FINDINGS  Left supraclavicular lymph node is increased in volume measuring 3.8 x 3.5 cm increased from 1.8 by 1.6 cm on CT 06/27/2014. Interval increase in right supraclavicular lymph node measuring 1.3 cm (image 66, series 7) compared to 0.8 cm.  No additional metastatic lymph nodes identified superior to the supraclavicular nodes. The glottis and supraglottic tissues superior normal. The salivary glands are normal.  Limited view of the base of the brain and orbits are normal.  A sclerotic lesion at C1 on the right (image 34 of sagittal series) and C3 lesion on image 41, series 7 are slightly more prominent.  CT CHEST FINDINGS  Mediastinum/Nodes: There is a port in the right chest wall no axillary adenopathy. Supraclavicular adenopathy described in the neck findings.  Lungs/Pleura: Bilateral pulmonary nodules again demonstrated and appear stable. Example nodule in the left upper lobe measures 13 mm x 15 mm (image 24, series 4) compared to 15 mm x 16 mm  Example nodule in the left upper lobe measures 9 mm on image 15 compared to 9 mm on prior  Example nodule the right middle lobe measures 16 mm by 9 mm compared 18 mm x 10 mm. No new pulmonary nodules are identified.  There is a moderate to large left pleural effusion not changed significantly in  volume.  The left lower lobe is atelectatic within this large pleural effusion. There are several low-density lesions within the atelectatic lung which are not changed from comparison exam (image 34, series 2).  CT ABDOMEN AND PELVIS FINDINGS  Hepatobiliary: The are several ill-defined lesions in the subcapsular right hepatic lobe which are not changed from prior. Example 12 mm x 9 mm lesion on image 54, series 2 compares to 15 mm x 11 mm. 11 mm lesion on image 55 compares to 12 mm on prior. Small is 6 mm lesion on image 59. No new lesions are identified.  Pancreas: Pancreas is normal. No ductal dilatation. No pancreatic inflammation.  Spleen: Low-density lobular lesion the spleen is increased  significantly measuring 31 mm compared 13 mm (image 60, series 2).  Adrenals/urinary tract: Adrenal glands and kidneys are normal. The ureters and bladder normal.  Stomach/Bowel: Stomach, small bowel, appendix, and cecum are normal. The colon and rectosigmoid colon are normal.  Vascular/Lymphatic: Mild interval increase in size of left periaortic retroperitoneal lymph nodes. For example 13 mm node on image 68, series 2 is increased from 8 mm. Adjacent necrotic 14 mm node on image 71 is increased from 13 mm. 9 mm aortocaval node same level is increased from 4 mm on prior.  Two adjacent periportal lymph nodes are again noted. The more lateral lymph node is increased in size measuring 15 mm (image 58, series 2) compared to 10 mm on prior. The larger 16 mm adjacent node is not changed.  Reproductive: Uterus and ovaries are normal.  Musculoskeletal: Multiple lytic and sclerotic lesions throughout the pelvis and spine are not changed in the interval.  Other: No peritoneal metastasis evident.  IMPRESSION: Neck impression:  1. Interval increase in bilateral supraclavicular adenopathy. The left supraclavicular node is large at 3.5 cm.  Chest Impression:  1. Bilateral pulmonary nodular metastasis are unchanged. 2. Mediastinal adenopathy  is unchanged. 3. Large left effusion with left lower lobe atelectasis is unchanged. Low-density lesions within the atelectatic left lung are stable.  Abdomen / Pelvis Impression:  1. Interval increase in size of a splenic metastasis. 2. Stable ill-defined liver metastases. 3. Interval increase in periportal and periaortic metastatic adenopathy. 4. Stable skeletal metastasis.   Electronically Signed   By: Suzy Bouchard M.D.   On: 08/22/2014 09:08   Ct Chest W Contrast  08/22/2014   CLINICAL DATA:  Lung cancer diagnosed 08/25/2013. Metastatic disease to bone. Chemotherapy in progress. Radiation therapy complete 2015. Subsequent treatment strategy.  EXAM: CT OF THE NECK WITH CONTRAST  CT OF THE CHEST WITH CONTRAST  CT OF THE ABDOMEN AND PELVIS WITH CONTRAST  TECHNIQUE: Multidetector CT imaging of the neck was performed with intravenous contrast.; Multidetector CT imaging of the abdomen and pelvis was performed following the standard protocol during bolus administration of intravenous contrast.; Multidetector CT imaging of the chest was performed following the standard protocol during bolus administration of intravenous contrast.  CONTRAST:  125m OMNIPAQUE IOHEXOL 300 MG/ML  SOLN  COMPARISON:  CT 06/27/2014, PET-CT 09/09/2013  FINDINGS: CT NECK FINDINGS  Left supraclavicular lymph node is increased in volume measuring 3.8 x 3.5 cm increased from 1.8 by 1.6 cm on CT 06/27/2014. Interval increase in right supraclavicular lymph node measuring 1.3 cm (image 66, series 7) compared to 0.8 cm.  No additional metastatic lymph nodes identified superior to the supraclavicular nodes. The glottis and supraglottic tissues superior normal. The salivary glands are normal.  Limited view of the base of the brain and orbits are normal.  A sclerotic lesion at C1 on the right (image 34 of sagittal series) and C3 lesion on image 41, series 7 are slightly more prominent.  CT CHEST FINDINGS  Mediastinum/Nodes: There is a port in the  right chest wall no axillary adenopathy. Supraclavicular adenopathy described in the neck findings.  Lungs/Pleura: Bilateral pulmonary nodules again demonstrated and appear stable. Example nodule in the left upper lobe measures 13 mm x 15 mm (image 24, series 4) compared to 15 mm x 16 mm  Example nodule in the left upper lobe measures 9 mm on image 15 compared to 9 mm on prior  Example nodule the right middle lobe measures 16 mm by 9 mm compared  18 mm x 10 mm. No new pulmonary nodules are identified.  There is a moderate to large left pleural effusion not changed significantly in volume.  The left lower lobe is atelectatic within this large pleural effusion. There are several low-density lesions within the atelectatic lung which are not changed from comparison exam (image 34, series 2).  CT ABDOMEN AND PELVIS FINDINGS  Hepatobiliary: The are several ill-defined lesions in the subcapsular right hepatic lobe which are not changed from prior. Example 12 mm x 9 mm lesion on image 54, series 2 compares to 15 mm x 11 mm. 11 mm lesion on image 55 compares to 12 mm on prior. Small is 6 mm lesion on image 59. No new lesions are identified.  Pancreas: Pancreas is normal. No ductal dilatation. No pancreatic inflammation.  Spleen: Low-density lobular lesion the spleen is increased significantly measuring 31 mm compared 13 mm (image 60, series 2).  Adrenals/urinary tract: Adrenal glands and kidneys are normal. The ureters and bladder normal.  Stomach/Bowel: Stomach, small bowel, appendix, and cecum are normal. The colon and rectosigmoid colon are normal.  Vascular/Lymphatic: Mild interval increase in size of left periaortic retroperitoneal lymph nodes. For example 13 mm node on image 68, series 2 is increased from 8 mm. Adjacent necrotic 14 mm node on image 71 is increased from 13 mm. 9 mm aortocaval node same level is increased from 4 mm on prior.  Two adjacent periportal lymph nodes are again noted. The more lateral lymph node  is increased in size measuring 15 mm (image 58, series 2) compared to 10 mm on prior. The larger 16 mm adjacent node is not changed.  Reproductive: Uterus and ovaries are normal.  Musculoskeletal: Multiple lytic and sclerotic lesions throughout the pelvis and spine are not changed in the interval.  Other: No peritoneal metastasis evident.  IMPRESSION: Neck impression:  1. Interval increase in bilateral supraclavicular adenopathy. The left supraclavicular node is large at 3.5 cm.  Chest Impression:  1. Bilateral pulmonary nodular metastasis are unchanged. 2. Mediastinal adenopathy is unchanged. 3. Large left effusion with left lower lobe atelectasis is unchanged. Low-density lesions within the atelectatic left lung are stable.  Abdomen / Pelvis Impression:  1. Interval increase in size of a splenic metastasis. 2. Stable ill-defined liver metastases. 3. Interval increase in periportal and periaortic metastatic adenopathy. 4. Stable skeletal metastasis.   Electronically Signed   By: Suzy Bouchard M.D.   On: 08/22/2014 09:08   Ct Abdomen Pelvis W Contrast  08/22/2014   CLINICAL DATA:  Lung cancer diagnosed 08/25/2013. Metastatic disease to bone. Chemotherapy in progress. Radiation therapy complete 2015. Subsequent treatment strategy.  EXAM: CT OF THE NECK WITH CONTRAST  CT OF THE CHEST WITH CONTRAST  CT OF THE ABDOMEN AND PELVIS WITH CONTRAST  TECHNIQUE: Multidetector CT imaging of the neck was performed with intravenous contrast.; Multidetector CT imaging of the abdomen and pelvis was performed following the standard protocol during bolus administration of intravenous contrast.; Multidetector CT imaging of the chest was performed following the standard protocol during bolus administration of intravenous contrast.  CONTRAST:  132m OMNIPAQUE IOHEXOL 300 MG/ML  SOLN  COMPARISON:  CT 06/27/2014, PET-CT 09/09/2013  FINDINGS: CT NECK FINDINGS  Left supraclavicular lymph node is increased in volume measuring 3.8 x 3.5  cm increased from 1.8 by 1.6 cm on CT 06/27/2014. Interval increase in right supraclavicular lymph node measuring 1.3 cm (image 66, series 7) compared to 0.8 cm.  No additional metastatic lymph nodes identified superior  to the supraclavicular nodes. The glottis and supraglottic tissues superior normal. The salivary glands are normal.  Limited view of the base of the brain and orbits are normal.  A sclerotic lesion at C1 on the right (image 34 of sagittal series) and C3 lesion on image 41, series 7 are slightly more prominent.  CT CHEST FINDINGS  Mediastinum/Nodes: There is a port in the right chest wall no axillary adenopathy. Supraclavicular adenopathy described in the neck findings.  Lungs/Pleura: Bilateral pulmonary nodules again demonstrated and appear stable. Example nodule in the left upper lobe measures 13 mm x 15 mm (image 24, series 4) compared to 15 mm x 16 mm  Example nodule in the left upper lobe measures 9 mm on image 15 compared to 9 mm on prior  Example nodule the right middle lobe measures 16 mm by 9 mm compared 18 mm x 10 mm. No new pulmonary nodules are identified.  There is a moderate to large left pleural effusion not changed significantly in volume.  The left lower lobe is atelectatic within this large pleural effusion. There are several low-density lesions within the atelectatic lung which are not changed from comparison exam (image 34, series 2).  CT ABDOMEN AND PELVIS FINDINGS  Hepatobiliary: The are several ill-defined lesions in the subcapsular right hepatic lobe which are not changed from prior. Example 12 mm x 9 mm lesion on image 54, series 2 compares to 15 mm x 11 mm. 11 mm lesion on image 55 compares to 12 mm on prior. Small is 6 mm lesion on image 59. No new lesions are identified.  Pancreas: Pancreas is normal. No ductal dilatation. No pancreatic inflammation.  Spleen: Low-density lobular lesion the spleen is increased significantly measuring 31 mm compared 13 mm (image 60, series  2).  Adrenals/urinary tract: Adrenal glands and kidneys are normal. The ureters and bladder normal.  Stomach/Bowel: Stomach, small bowel, appendix, and cecum are normal. The colon and rectosigmoid colon are normal.  Vascular/Lymphatic: Mild interval increase in size of left periaortic retroperitoneal lymph nodes. For example 13 mm node on image 68, series 2 is increased from 8 mm. Adjacent necrotic 14 mm node on image 71 is increased from 13 mm. 9 mm aortocaval node same level is increased from 4 mm on prior.  Two adjacent periportal lymph nodes are again noted. The more lateral lymph node is increased in size measuring 15 mm (image 58, series 2) compared to 10 mm on prior. The larger 16 mm adjacent node is not changed.  Reproductive: Uterus and ovaries are normal.  Musculoskeletal: Multiple lytic and sclerotic lesions throughout the pelvis and spine are not changed in the interval.  Other: No peritoneal metastasis evident.  IMPRESSION: Neck impression:  1. Interval increase in bilateral supraclavicular adenopathy. The left supraclavicular node is large at 3.5 cm.  Chest Impression:  1. Bilateral pulmonary nodular metastasis are unchanged. 2. Mediastinal adenopathy is unchanged. 3. Large left effusion with left lower lobe atelectasis is unchanged. Low-density lesions within the atelectatic left lung are stable.  Abdomen / Pelvis Impression:  1. Interval increase in size of a splenic metastasis. 2. Stable ill-defined liver metastases. 3. Interval increase in periportal and periaortic metastatic adenopathy. 4. Stable skeletal metastasis.   Electronically Signed   By: Suzy Bouchard M.D.   On: 08/22/2014 09:08   Ct Soft Tissue Neck W Contrast  08/22/2014   CLINICAL DATA:  Lung cancer diagnosed 08/25/2013. Metastatic disease to bone. Chemotherapy in progress. Radiation therapy complete 2015. Subsequent  treatment strategy.  EXAM: CT OF THE NECK WITH CONTRAST  CT OF THE CHEST WITH CONTRAST  CT OF THE ABDOMEN AND  PELVIS WITH CONTRAST  TECHNIQUE: Multidetector CT imaging of the neck was performed with intravenous contrast.; Multidetector CT imaging of the abdomen and pelvis was performed following the standard protocol during bolus administration of intravenous contrast.; Multidetector CT imaging of the chest was performed following the standard protocol during bolus administration of intravenous contrast.  CONTRAST:  149m OMNIPAQUE IOHEXOL 300 MG/ML  SOLN  COMPARISON:  CT 06/27/2014, PET-CT 09/09/2013  FINDINGS: CT NECK FINDINGS  Left supraclavicular lymph node is increased in volume measuring 3.8 x 3.5 cm increased from 1.8 by 1.6 cm on CT 06/27/2014. Interval increase in right supraclavicular lymph node measuring 1.3 cm (image 66, series 7) compared to 0.8 cm.  No additional metastatic lymph nodes identified superior to the supraclavicular nodes. The glottis and supraglottic tissues superior normal. The salivary glands are normal.  Limited view of the base of the brain and orbits are normal.  A sclerotic lesion at C1 on the right (image 34 of sagittal series) and C3 lesion on image 41, series 7 are slightly more prominent.  CT CHEST FINDINGS  Mediastinum/Nodes: There is a port in the right chest wall no axillary adenopathy. Supraclavicular adenopathy described in the neck findings.  Lungs/Pleura: Bilateral pulmonary nodules again demonstrated and appear stable. Example nodule in the left upper lobe measures 13 mm x 15 mm (image 24, series 4) compared to 15 mm x 16 mm  Example nodule in the left upper lobe measures 9 mm on image 15 compared to 9 mm on prior  Example nodule the right middle lobe measures 16 mm by 9 mm compared 18 mm x 10 mm. No new pulmonary nodules are identified.  There is a moderate to large left pleural effusion not changed significantly in volume.  The left lower lobe is atelectatic within this large pleural effusion. There are several low-density lesions within the atelectatic lung which are not  changed from comparison exam (image 34, series 2).  CT ABDOMEN AND PELVIS FINDINGS  Hepatobiliary: The are several ill-defined lesions in the subcapsular right hepatic lobe which are not changed from prior. Example 12 mm x 9 mm lesion on image 54, series 2 compares to 15 mm x 11 mm. 11 mm lesion on image 55 compares to 12 mm on prior. Small is 6 mm lesion on image 59. No new lesions are identified.  Pancreas: Pancreas is normal. No ductal dilatation. No pancreatic inflammation.  Spleen: Low-density lobular lesion the spleen is increased significantly measuring 31 mm compared 13 mm (image 60, series 2).  Adrenals/urinary tract: Adrenal glands and kidneys are normal. The ureters and bladder normal.  Stomach/Bowel: Stomach, small bowel, appendix, and cecum are normal. The colon and rectosigmoid colon are normal.  Vascular/Lymphatic: Mild interval increase in size of left periaortic retroperitoneal lymph nodes. For example 13 mm node on image 68, series 2 is increased from 8 mm. Adjacent necrotic 14 mm node on image 71 is increased from 13 mm. 9 mm aortocaval node same level is increased from 4 mm on prior.  Two adjacent periportal lymph nodes are again noted. The more lateral lymph node is increased in size measuring 15 mm (image 58, series 2) compared to 10 mm on prior. The larger 16 mm adjacent node is not changed.  Reproductive: Uterus and ovaries are normal.  Musculoskeletal: Multiple lytic and sclerotic lesions throughout the pelvis and spine  are not changed in the interval.  Other: No peritoneal metastasis evident.  IMPRESSION: Neck impression:  1. Interval increase in bilateral supraclavicular adenopathy. The left supraclavicular node is large at 3.5 cm.  Chest Impression:  1. Bilateral pulmonary nodular metastasis are unchanged. 2. Mediastinal adenopathy is unchanged. 3. Large left effusion with left lower lobe atelectasis is unchanged. Low-density lesions within the atelectatic left lung are stable.  Abdomen /  Pelvis Impression:  1. Interval increase in size of a splenic metastasis. 2. Stable ill-defined liver metastases. 3. Interval increase in periportal and periaortic metastatic adenopathy. 4. Stable skeletal metastasis.   Electronically Signed   By: Suzy Bouchard M.D.   On: 08/22/2014 09:08   Ct Chest W Contrast  08/22/2014   CLINICAL DATA:  Lung cancer diagnosed 08/25/2013. Metastatic disease to bone. Chemotherapy in progress. Radiation therapy complete 2015. Subsequent treatment strategy.  EXAM: CT OF THE NECK WITH CONTRAST  CT OF THE CHEST WITH CONTRAST  CT OF THE ABDOMEN AND PELVIS WITH CONTRAST  TECHNIQUE: Multidetector CT imaging of the neck was performed with intravenous contrast.; Multidetector CT imaging of the abdomen and pelvis was performed following the standard protocol during bolus administration of intravenous contrast.; Multidetector CT imaging of the chest was performed following the standard protocol during bolus administration of intravenous contrast.  CONTRAST:  148m OMNIPAQUE IOHEXOL 300 MG/ML  SOLN  COMPARISON:  CT 06/27/2014, PET-CT 09/09/2013  FINDINGS: CT NECK FINDINGS  Left supraclavicular lymph node is increased in volume measuring 3.8 x 3.5 cm increased from 1.8 by 1.6 cm on CT 06/27/2014. Interval increase in right supraclavicular lymph node measuring 1.3 cm (image 66, series 7) compared to 0.8 cm.  No additional metastatic lymph nodes identified superior to the supraclavicular nodes. The glottis and supraglottic tissues superior normal. The salivary glands are normal.  Limited view of the base of the brain and orbits are normal.  A sclerotic lesion at C1 on the right (image 34 of sagittal series) and C3 lesion on image 41, series 7 are slightly more prominent.  CT CHEST FINDINGS  Mediastinum/Nodes: There is a port in the right chest wall no axillary adenopathy. Supraclavicular adenopathy described in the neck findings.  Lungs/Pleura: Bilateral pulmonary nodules again demonstrated  and appear stable. Example nodule in the left upper lobe measures 13 mm x 15 mm (image 24, series 4) compared to 15 mm x 16 mm  Example nodule in the left upper lobe measures 9 mm on image 15 compared to 9 mm on prior  Example nodule the right middle lobe measures 16 mm by 9 mm compared 18 mm x 10 mm. No new pulmonary nodules are identified.  There is a moderate to large left pleural effusion not changed significantly in volume.  The left lower lobe is atelectatic within this large pleural effusion. There are several low-density lesions within the atelectatic lung which are not changed from comparison exam (image 34, series 2).  CT ABDOMEN AND PELVIS FINDINGS  Hepatobiliary: The are several ill-defined lesions in the subcapsular right hepatic lobe which are not changed from prior. Example 12 mm x 9 mm lesion on image 54, series 2 compares to 15 mm x 11 mm. 11 mm lesion on image 55 compares to 12 mm on prior. Small is 6 mm lesion on image 59. No new lesions are identified.  Pancreas: Pancreas is normal. No ductal dilatation. No pancreatic inflammation.  Spleen: Low-density lobular lesion the spleen is increased significantly measuring 31 mm compared 13 mm (image  60, series 2).  Adrenals/urinary tract: Adrenal glands and kidneys are normal. The ureters and bladder normal.  Stomach/Bowel: Stomach, small bowel, appendix, and cecum are normal. The colon and rectosigmoid colon are normal.  Vascular/Lymphatic: Mild interval increase in size of left periaortic retroperitoneal lymph nodes. For example 13 mm node on image 68, series 2 is increased from 8 mm. Adjacent necrotic 14 mm node on image 71 is increased from 13 mm. 9 mm aortocaval node same level is increased from 4 mm on prior.  Two adjacent periportal lymph nodes are again noted. The more lateral lymph node is increased in size measuring 15 mm (image 58, series 2) compared to 10 mm on prior. The larger 16 mm adjacent node is not changed.  Reproductive: Uterus and  ovaries are normal.  Musculoskeletal: Multiple lytic and sclerotic lesions throughout the pelvis and spine are not changed in the interval.  Other: No peritoneal metastasis evident.  IMPRESSION: Neck impression:  1. Interval increase in bilateral supraclavicular adenopathy. The left supraclavicular node is large at 3.5 cm.  Chest Impression:  1. Bilateral pulmonary nodular metastasis are unchanged. 2. Mediastinal adenopathy is unchanged. 3. Large left effusion with left lower lobe atelectasis is unchanged. Low-density lesions within the atelectatic left lung are stable.  Abdomen / Pelvis Impression:  1. Interval increase in size of a splenic metastasis. 2. Stable ill-defined liver metastases. 3. Interval increase in periportal and periaortic metastatic adenopathy. 4. Stable skeletal metastasis.   Electronically Signed   By: Suzy Bouchard M.D.   On: 08/22/2014 09:08   Ct Abdomen Pelvis W Contrast  08/22/2014   CLINICAL DATA:  Lung cancer diagnosed 08/25/2013. Metastatic disease to bone. Chemotherapy in progress. Radiation therapy complete 2015. Subsequent treatment strategy.  EXAM: CT OF THE NECK WITH CONTRAST  CT OF THE CHEST WITH CONTRAST  CT OF THE ABDOMEN AND PELVIS WITH CONTRAST  TECHNIQUE: Multidetector CT imaging of the neck was performed with intravenous contrast.; Multidetector CT imaging of the abdomen and pelvis was performed following the standard protocol during bolus administration of intravenous contrast.; Multidetector CT imaging of the chest was performed following the standard protocol during bolus administration of intravenous contrast.  CONTRAST:  126m OMNIPAQUE IOHEXOL 300 MG/ML  SOLN  COMPARISON:  CT 06/27/2014, PET-CT 09/09/2013  FINDINGS: CT NECK FINDINGS  Left supraclavicular lymph node is increased in volume measuring 3.8 x 3.5 cm increased from 1.8 by 1.6 cm on CT 06/27/2014. Interval increase in right supraclavicular lymph node measuring 1.3 cm (image 66, series 7) compared to 0.8  cm.  No additional metastatic lymph nodes identified superior to the supraclavicular nodes. The glottis and supraglottic tissues superior normal. The salivary glands are normal.  Limited view of the base of the brain and orbits are normal.  A sclerotic lesion at C1 on the right (image 34 of sagittal series) and C3 lesion on image 41, series 7 are slightly more prominent.  CT CHEST FINDINGS  Mediastinum/Nodes: There is a port in the right chest wall no axillary adenopathy. Supraclavicular adenopathy described in the neck findings.  Lungs/Pleura: Bilateral pulmonary nodules again demonstrated and appear stable. Example nodule in the left upper lobe measures 13 mm x 15 mm (image 24, series 4) compared to 15 mm x 16 mm  Example nodule in the left upper lobe measures 9 mm on image 15 compared to 9 mm on prior  Example nodule the right middle lobe measures 16 mm by 9 mm compared 18 mm x 10 mm. No new  pulmonary nodules are identified.  There is a moderate to large left pleural effusion not changed significantly in volume.  The left lower lobe is atelectatic within this large pleural effusion. There are several low-density lesions within the atelectatic lung which are not changed from comparison exam (image 34, series 2).  CT ABDOMEN AND PELVIS FINDINGS  Hepatobiliary: The are several ill-defined lesions in the subcapsular right hepatic lobe which are not changed from prior. Example 12 mm x 9 mm lesion on image 54, series 2 compares to 15 mm x 11 mm. 11 mm lesion on image 55 compares to 12 mm on prior. Small is 6 mm lesion on image 59. No new lesions are identified.  Pancreas: Pancreas is normal. No ductal dilatation. No pancreatic inflammation.  Spleen: Low-density lobular lesion the spleen is increased significantly measuring 31 mm compared 13 mm (image 60, series 2).  Adrenals/urinary tract: Adrenal glands and kidneys are normal. The ureters and bladder normal.  Stomach/Bowel: Stomach, small bowel, appendix, and cecum  are normal. The colon and rectosigmoid colon are normal.  Vascular/Lymphatic: Mild interval increase in size of left periaortic retroperitoneal lymph nodes. For example 13 mm node on image 68, series 2 is increased from 8 mm. Adjacent necrotic 14 mm node on image 71 is increased from 13 mm. 9 mm aortocaval node same level is increased from 4 mm on prior.  Two adjacent periportal lymph nodes are again noted. The more lateral lymph node is increased in size measuring 15 mm (image 58, series 2) compared to 10 mm on prior. The larger 16 mm adjacent node is not changed.  Reproductive: Uterus and ovaries are normal.  Musculoskeletal: Multiple lytic and sclerotic lesions throughout the pelvis and spine are not changed in the interval.  Other: No peritoneal metastasis evident.  IMPRESSION: Neck impression:  1. Interval increase in bilateral supraclavicular adenopathy. The left supraclavicular node is large at 3.5 cm.  Chest Impression:  1. Bilateral pulmonary nodular metastasis are unchanged. 2. Mediastinal adenopathy is unchanged. 3. Large left effusion with left lower lobe atelectasis is unchanged. Low-density lesions within the atelectatic left lung are stable.  Abdomen / Pelvis Impression:  1. Interval increase in size of a splenic metastasis. 2. Stable ill-defined liver metastases. 3. Interval increase in periportal and periaortic metastatic adenopathy. 4. Stable skeletal metastasis.   Electronically Signed   By: Suzy Bouchard M.D.   On: 08/22/2014 09:08   ASSESSMENT AND PLAN: This is a very pleasant 65 years old white female with metastatic non-small cell lung cancer, adenocarcinoma status post induction chemotherapy with carboplatin and Alimta but unfortunately has evidence for disease progression. She is currently undergoing immunotherapy with Nivolumab status post 16 cycles and tolerating her treatment fairly well. The patient is doing very well with this treatment and she continues to have significant  improvement in her symptoms except for the recent left neck and left hip pain. The recent CT scan of the neck, chest, abdomen and pelvis showed interval increase in bilateral supraclavicular adenopathy with a larger left supraclavicular lymph nodes the bilateral pulmonary nodules, mediastinal lymph nodes as well as a large left effusion are unchanged. There was interval increase in the size of these pain neck metastasis in addition to increase and the periportal and periaortic metastatic lymphadenopathy. I had a lengthy discussion with the patient and her boyfriend today about her current condition and treatment options. I recommended for the patient to discontinue her current treatment with immunotherapy with Nivolumab. I discussed with the patient  other treatment options including palliative care and hospice versus systemic chemotherapy with docetaxel and Cyramza versus treatment with targeted therapy with oral Gilotrif (Afatinib) as the previous biomarker studies showed positive ERBB3 ampilification which is expected to respond to treatment with Afatinib. The patient is interested in proceeding with the oral target therapy. I discussed with her the adverse effect of this treatment including but not limited to a skin rash, diarrhea, interstitial lung disease, liver or renal dysfunction. I will send her prescription to [facility] outpatient pharmacy. For the enlarging and painful left supraclavicular lymphadenopathy, I would send the patient to Dr. Brantley Stage for consideration of palliative radiotherapy to this area.  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 She may also benefit from palliative radiotherapy to the left hip. For the abdominal and back pain, she will continue on gabapentin and tramadol. For anticoagulation, she will continue on Xarelto. The patient would come back for follow-up visit in 3 weeks for  reevaluation and management of any adverse effect of her treatment. She was advised to call immediately if she has any other concerning symptoms in the interval. The patient voices understanding of current disease status and treatment options and is in agreement with the current care plan.  All questions were answered. The patient knows to call the clinic with any problems, questions or concerns. We can certainly see the patient much sooner if necessary.  Disclaimer: This note was dictated with voice recognition software. Similar sounding words can inadvertently be transcribed and may not be corrected upon review.

## 2014-08-26 ENCOUNTER — Telehealth: Payer: Self-pay | Admitting: *Deleted

## 2014-08-26 ENCOUNTER — Encounter: Payer: Self-pay | Admitting: Internal Medicine

## 2014-08-26 NOTE — Telephone Encounter (Signed)
"  Yesterday I talked with Dr. Julien Nordmann about my left groin area hurting.  He asked if I have an enlarged lymph node there.  As far as I know I don't.  My left left groin still hurts and I need to know what it is.  An enlarged lymph node was found on my left shoulder.  I would like to know if the CT scan shows enlarged lymph node to left groin.  Took aleve which helped the shoulder but it did not help the groin."  Return number [phone].    1300 called patient who reports she is still wearing Duragesic patch.  "Aleve works better than the tramadol.  I've experienced the pain for month and discussed this the past two visits.  I had radiation August, July in this area."  Denies need for change in pain medicine at this time.  Would like return call 08-29-2014.

## 2014-08-26 NOTE — Progress Notes (Signed)
Per [employer] gilotrif '40mg'$  tablets are approved. MQTTCN:[id number];VLDKCCQ Name:ST: Gilotrif - *[place]*;Status:Approved;Coverage Start Date:07/27/2014;Coverage End Date:08/25/2017;

## 2014-08-26 NOTE — Progress Notes (Signed)
Histology and Location of Primary Cancer: stage IV (T2a, N3, M1b) non-small cell lung cancer, adenocarcinoma presented with large left lower lobe lung mass in addition to bilateral pulmonary nodules and mediastinal and supraclavicular lymphadenopathy diagnosed in May of 2015.   Location(s) of Symptomatic tumor(s): left neck lymph nodes - CT of neck from 08/22/14 shows "Left supraclavicular lymph node is increased in volume measuring 3.8 x 3.5 cm increased from 1.8 by 1.6 cm on CT 06/27/2014."  Past/Anticipated chemotherapy by medical oncology, if any: Systemic chemotherapy with carboplatin for AUC of 5, Alimta 500 mg/M2 and Avastin 15 mg/KG every 3 weeks, status post 5 cycles. The first 4 cycles of her treatments were given at [facility] under the care of Dr. Oretha Caprice. Immunotherapy with Nivolmab at 3 mg/kg given every 3 weeks. Status post 16 cycles discontinued secondary to disease progression. Gilotrif 40 mg by mouth daily for a patient with positive ERBB3 amplification. First dose was on Monday, 5//16/16. She is not taking Xgeva.  Pain on a scale of 0-10 is: 2 in her left shoulder.  She is taking aleve which helps.   SAFETY ISSUES:  Prior radiation?at [facility] to left hip, sacrum and brain, 02/10/14-03/06/14 - left central chest 35 gray  Pacemaker/ICD? no  Possible current pregnancy? no  Is the patient on methotrexate? no  Additional Complaints / other details:  Sherri Stafford is here with her partner.  She reports that she will be having an MRI of her brain at [facility] on Friday.  She reports she had itching at night over her chest. 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  She is worried that radiation will trigger it again.    BP 127/68 mmHg  Pulse 94  Temp(Src) 97.9 F (36.6 C) (Oral)  Resp 16  Ht 5' 4.75" (1.645 m)  Wt 123 lb 3.2 oz (55.883 kg)  BMI 20.65 kg/m2  SpO2 100%  LMP 01/14/2008

## 2014-08-26 NOTE — Telephone Encounter (Signed)
PT. ASKED IF THE GILOTRIF PRESCRIPTION HAD BEEN SENT TO THE PHARMACY. EXPLAINED TO PT. THAT THE PRESCRIPTION WAS SENT TO THE PHARMACY BUT THE MEDICATION NEEDS A PRIOR AUTHORIZATION FROM HER INSURANCE COMPANY. THE MANAGED CARE DEPARTMENT AT [facility] IS WORKING WITH THE INSURANCE COMPANY TO GET THE MEDICATION APPROVED. INSTRUCTED PT. TO CALL THE CANCER CENTER Monday AFTERNOON IF SHE HAS NOT HEARD FROM THE PHARMACY. SHE VOICES UNDERSTANDING.

## 2014-08-27 ENCOUNTER — Encounter: Payer: Self-pay | Admitting: Internal Medicine

## 2014-08-27 NOTE — Telephone Encounter (Signed)
No enlarged Lymph nodes in that area but some bone disease, unchanged from before. She will see Dr. Sondra Come for XRT. She can discuss with him if she needs XRT to the hip area.

## 2014-08-29 ENCOUNTER — Telehealth: Payer: Self-pay | Admitting: *Deleted

## 2014-08-29 ENCOUNTER — Ambulatory Visit (INDEPENDENT_AMBULATORY_CARE_PROVIDER_SITE_OTHER): Payer: BC Managed Care – PPO | Admitting: Nurse Practitioner

## 2014-08-29 ENCOUNTER — Encounter: Payer: Self-pay | Admitting: Nurse Practitioner

## 2014-08-29 VITALS — BP 108/70 | HR 92 | Ht 64.75 in | Wt 127.0 lb

## 2014-08-29 DIAGNOSIS — Z0000 Encounter for general adult medical examination without abnormal findings: Secondary | ICD-10-CM

## 2014-08-29 NOTE — Progress Notes (Signed)
64 y.o. WM Fe  G0P0 here for follow up of her AEX done on  08/09/14.  We discussed doing a pap since she is also now dealing with a new lung cancer.   She recalls the pap being done but no pap was sent in on my error or lab error.  She comes in today for a pap only at no charge for the OV and just charged for a pap.   She now stated the Nivolumab has been stopped for treatment of her stage IV lung cancer.     O: Healthy WD,WN female  Pelvic exam: normal vaginal discharge, atrophic, specimen is obtained for pap.  Previous exam included a bimanual exam and rectal.  A: Collection of pap smear    P: Will follow with pap smear.    Copy of labs is given to pt.  Her Vit D was very low at 15 and states she is unable to take Vit D supplementation but will try to increase with foods.   Labs : pap    RV

## 2014-08-29 NOTE — Telephone Encounter (Signed)
I told pt that the lump she is feeling is what showed up on CT scan per  Mohamed's note. She will start her gilotrif today and has  radiation appt  this week.

## 2014-08-29 NOTE — Telephone Encounter (Signed)
Pt called and left message wanting to talk to nurse about new neck lump.  Spoke with pt and was informed that pt noticed a lump on left side of neck last night - size of a quarter.  Pt denied pain in this area.  Pt also stated she still has pain in the left groin lump.   Pt was not sure if she should be concerned about the neck lump.  Pt would like what Dr. Julien Nordmann thoughts on these  issues.   Pt had seen Dr. Julien Nordmann on 08/25/14, and was told that she had enlarged lymph nodes.   Message routed to Dr. Julien Nordmann, and Diane, desk nurse today. Pt's  Phone   [phone].

## 2014-08-29 NOTE — Patient Instructions (Signed)
Follow with pap

## 2014-08-30 NOTE — Telephone Encounter (Signed)
Pt seen 08/29/14 for pap. Ending follow up and closing encounter.

## 2014-08-30 NOTE — Telephone Encounter (Signed)
Observed phone documenation by Elray Buba RN who called patient.

## 2014-08-31 LAB — IPS PAP TEST WITH HPV

## 2014-09-01 ENCOUNTER — Encounter: Payer: Self-pay | Admitting: Radiation Oncology

## 2014-09-01 ENCOUNTER — Ambulatory Visit
Admission: RE | Admit: 2014-09-01 | Discharge: 2014-09-01 | Disposition: A | Discharge status: Home / Self Care | Payer: BC Managed Care – PPO | Source: Ambulatory Visit | Attending: Radiation Oncology | Admitting: Radiation Oncology

## 2014-09-01 VITALS — BP 127/68 | HR 94 | Temp 97.9°F | Resp 16 | Ht 64.75 in | Wt 123.2 lb

## 2014-09-01 DIAGNOSIS — Z85118 Personal history of other malignant neoplasm of bronchus and lung: Secondary | ICD-10-CM | POA: Diagnosis not present

## 2014-09-01 DIAGNOSIS — Z510 Encounter for antineoplastic radiation therapy: Secondary | ICD-10-CM | POA: Insufficient documentation

## 2014-09-01 DIAGNOSIS — C3492 Malignant neoplasm of unspecified part of left bronchus or lung: Secondary | ICD-10-CM

## 2014-09-01 DIAGNOSIS — C3490 Malignant neoplasm of unspecified part of unspecified bronchus or lung: Secondary | ICD-10-CM

## 2014-09-01 DIAGNOSIS — Z9221 Personal history of antineoplastic chemotherapy: Secondary | ICD-10-CM | POA: Insufficient documentation

## 2014-09-01 DIAGNOSIS — Z923 Personal history of irradiation: Secondary | ICD-10-CM | POA: Diagnosis not present

## 2014-09-01 DIAGNOSIS — R221 Localized swelling, mass and lump, neck: Secondary | ICD-10-CM | POA: Insufficient documentation

## 2014-09-01 NOTE — Progress Notes (Signed)
Radiation Oncology         [phone] ________________________________  Name: Sherri Stafford MRN: [id number]  Date: 09/01/2014  DOB: 02-18-1951  Re-Evaluation Note  CC: [name], MD  Curt Bears, MD  Diagnosis: stage IV (T2a, N3, M1b) non-small cell lung cancer   Interval Since Last Radiation:   6 months, the patient completed palliative radiation therapy to the left central chest area  Narrative: The patient returns today for evaluation of mass in the neck. Patient c/o of pain in left neck area, left hip area. Have pain when getting up and moving around. Weakness in the left leg, previous radiation to pelvis ([facility]) and lung area. Denies coughing up blood or pain in the chest area. She denies any breathing issues. Having discomfort/pain in the left supraclavicular region from her mass in this area  Sherri Stafford is here with her partner. She reports that she will be having an MRI of her brain at [facility] on Friday. She reports she had itching at night over her back after her chest radiation. Dr. Leonel Ramsay at [facility] told her this was "[place]." She was told heat activates it. She is worried that radiation will trigger it again.    ALLERGIES:  is allergic to other; ketoconazole; lorazepam; morphine and related; nystatin; sporanox; tylenol; vitamin d; and zofran.  Meds: Current Outpatient Prescriptions  Medication Sig Dispense Refill  . albuterol (PROVENTIL HFA;VENTOLIN HFA) 108 (90 BASE) MCG/ACT inhaler Inhale 1-2 puffs into the lungs every 6 (six) hours as needed for wheezing or shortness of breath. (Patient not taking: Reported on 03/25/2014) 1 Inhaler 2  . bisacodyl (DULCOLAX) 10 MG suppository Place 1 suppository (10 mg total) rectally daily as needed for moderate constipation. (Patient not taking: Reported on 03/11/2014) 10 suppository 0  . calcium carbonate (OS-CAL) 1250 MG chewable tablet Chew 4 tablets by mouth daily.    . codeine 30 MG tablet Take 1 tablet (30 mg total) by  mouth every 4 (four) hours as needed (cough). (Patient not taking: Reported on 03/11/2014) 80 tablet 0  . [employer] SENNA PLUS 8.6-50 MG per tablet   11  . docusate sodium (COLACE) 100 MG capsule Take 100 mg by mouth 2 (two) times daily.    Marland Kitchen dronabinol (MARINOL) 2.5 MG capsule Take 1 capsule (2.5 mg total) by mouth 2 (two) times daily before a meal. 60 capsule 0  . enoxaparin (LOVENOX) 100 MG/ML injection ONLY INJECT 0.9 ML SUBCUTANEOUSLY EVERY DAY 30 Syringe 0  . esomeprazole (NEXIUM) 40 MG capsule Take 1 capsule (40 mg total) by mouth 2 (two) times daily before a meal. 60 capsule 0  . fentaNYL (DURAGESIC - DOSED MCG/HR) 50 MCG/HR Place 1 patch (50 mcg total) onto the skin every 3 (three) days. 10 patch 0  . gabapentin (NEURONTIN) 600 MG tablet Take 600 mg by mouth 3 (three) times daily.    Marland Kitchen gabapentin (NEURONTIN) 800 MG tablet     . glycerin adult 2 G SUPP Place 1 suppository rectally once as needed for moderate constipation.    Marland Kitchen loratadine (CLARITIN) 10 MG tablet Take 1 tablet (10 mg total) by mouth daily. 15 tablet 0  . OLANZapine (ZYPREXA) 10 MG tablet     . polyethylene glycol (MIRALAX / GLYCOLAX) packet Take 17 g by mouth daily as needed for moderate constipation.    . polyvinyl alcohol (LIQUIFILM TEARS) 1.4 % ophthalmic solution Place 1 drop into both eyes as needed for dry eyes.     Marland Kitchen prochlorperazine (COMPAZINE) 10  MG tablet Take 1 tablet (10 mg total) by mouth every 6 (six) hours as needed for nausea or vomiting. 30 tablet 1  . traMADol (ULTRAM) 50 MG tablet TAKE 1 TABLET BY MOUTH EVERY 6 HOURS AS NEEDED 60 tablet 0  . Wound Cleansers (RADIAPLEX EX) Apply topically.     No current facility-administered medications for this encounter.    Physical Findings: The patient is in no acute distress. Patient is alert and oriented. BP 127/68 mmHg  Pulse 94  Temp(Src) 97.9 F (36.6 C) (Oral)  Resp 16  Ht 5' 4.75" (1.645 m)  Wt 123 lb 3.2 oz (55.883 kg)  BMI 20.65 kg/m2  SpO2 100%  LMP  01/14/2008  vitals were not taken for this visit. The lungs are clear. The heart has a regular rhythm and rate. Abdomen soft non-tender, bowel sounds present. Left supraclavicular mass measuring 3 x 4 cm in size. No skin involvement. No other palpable adenopathy in the neck and clavicular regions or axillary regions. No palpable inguinal adenopathy.  Examination of the pelvic region reveals some slight thickening along the soft tissues of the left upper medial 5. No palpable mass or nodularity. No significant skin reaction in this area. No skin reaction noted in the back region.  Lab Findings: Lab Results  Component Value Date   WBC 3.4* 04/07/2014   HGB 9.3* 04/07/2014   HCT 29.6* 04/07/2014   MCV 84.6 04/07/2014   PLT 225 04/07/2014    Radiographic Findings: No results found.  Impression: Progressive disease in the left supra clavicular fossa. The patient would be a good candidate for a short course of palliative radiation therapy directed at this area.  I examined the patient carefully and reviewed her pelvic CT scans. I am unable to identify a source of her pain in the left medial pelvis region. This appears to be muscular in origin based on the location.   Plan:  Treat left supraclavicular mass with radiation for a course of 3 weeks.  This document serves as a record of services personally performed by Gery Pray, MD. It was created on his behalf by Jeralene Peters, a trained medical scribe. The creation of this record is based on the scribe's personal observations and the provider's statements to them. This document has been checked and approved by the attending provider.       ____________________________________ Blair Promise, MD

## 2014-09-01 NOTE — Progress Notes (Signed)
Please see the Nurse Progress Note in the MD Initial Consult Encounter for this patient. 

## 2014-09-04 NOTE — Progress Notes (Signed)
Encounter reviewed by Dr. Adonai Helzer Silva.  

## 2014-09-05 NOTE — Progress Notes (Signed)
  Radiation Oncology         [phone] ________________________________  Name: Sherri Stafford MRN: [id number]  Date: 09/01/2014  DOB: 1950-08-16  SIMULATION AND TREATMENT PLANNING NOTE    ICD-9-CM ICD-10-CM   1. 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  Identity was confirmed.  All relevant records and images related to the planned course of therapy were reviewed.  The patient freely provided informed written consent to proceed with treatment after reviewing the details related to the planned course of therapy. The consent form was witnessed and verified by the simulation staff.  Then, the patient was set-up in a stable reproducible  supine position for radiation therapy.  CT images were obtained.  Surface markings were placed.  The CT images were loaded into the planning software.  Then the target and avoidance structures were contoured.  Treatment planning then occurred.  The radiation prescription was entered and confirmed.  Then, I designed and supervised the construction of a total of 1 medically necessary complex treatment devices.  I have requested : 3D Simulation  I have requested a DVH of the following structures: GTV, PTV, lungs spinal cord.  I have ordered:dose calc.  PLAN:  The patient will receive 35 Gy in 14 fractions directed at the left supraclavicular mass.  ________________________________  -----------------------------------  Blair Promise, PhD, MD

## 2014-09-06 DIAGNOSIS — Z510 Encounter for antineoplastic radiation therapy: Secondary | ICD-10-CM | POA: Diagnosis not present

## 2014-09-07 ENCOUNTER — Ambulatory Visit
Admission: RE | Admit: 2014-09-07 | Discharge: 2014-09-07 | Disposition: A | Discharge status: Home / Self Care | Payer: BC Managed Care – PPO | Source: Ambulatory Visit | Attending: Radiation Oncology | Admitting: Radiation Oncology

## 2014-09-07 ENCOUNTER — Telehealth: Payer: Self-pay | Admitting: Internal Medicine

## 2014-09-07 DIAGNOSIS — C3492 Malignant neoplasm of unspecified part of left bronchus or lung: Secondary | ICD-10-CM

## 2014-09-07 DIAGNOSIS — Z510 Encounter for antineoplastic radiation therapy: Secondary | ICD-10-CM | POA: Diagnosis not present

## 2014-09-07 NOTE — Progress Notes (Signed)
  Radiation Oncology         [phone] ________________________________  Name: Sherri Stafford MRN: [id number]  Date: 09/07/2014  DOB: [birth year]  Simulation Verification Note    ICD-9-CM ICD-10-CM   1. 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 The clinical setup was verified. Then port films were obtained and uploaded to the radiation oncology medical record software.  The treatment beams were carefully compared against the planned radiation fields. The position location and shape of the radiation fields was reviewed. They targeted volume of tissue appears to be appropriately covered by the radiation beams. Organs at risk appear to be excluded as planned.  Based on my personal review, I approved the simulation verification. The patient's treatment will proceed as planned.  -----------------------------------  Blair Promise, PhD, MD

## 2014-09-07 NOTE — Telephone Encounter (Signed)
Moved 6/1 appointment to AM after xrt due to center event. S/w patient she is aware.

## 2014-09-08 ENCOUNTER — Other Ambulatory Visit: Payer: BC Managed Care – PPO

## 2014-09-08 ENCOUNTER — Ambulatory Visit: Payer: BC Managed Care – PPO

## 2014-09-08 ENCOUNTER — Ambulatory Visit: Payer: BC Managed Care – PPO | Admitting: Physician Assistant

## 2014-09-08 ENCOUNTER — Ambulatory Visit
Admission: RE | Admit: 2014-09-08 | Discharge: 2014-09-08 | Disposition: A | Discharge status: Home / Self Care | Payer: BC Managed Care – PPO | Source: Ambulatory Visit | Attending: Radiation Oncology | Admitting: Radiation Oncology

## 2014-09-08 DIAGNOSIS — Z510 Encounter for antineoplastic radiation therapy: Secondary | ICD-10-CM | POA: Diagnosis not present

## 2014-09-09 ENCOUNTER — Ambulatory Visit
Admission: RE | Admit: 2014-09-09 | Discharge: 2014-09-09 | Disposition: A | Discharge status: Home / Self Care | Payer: BC Managed Care – PPO | Source: Ambulatory Visit | Attending: Radiation Oncology | Admitting: Radiation Oncology

## 2014-09-09 ENCOUNTER — Ambulatory Visit: Payer: BC Managed Care – PPO

## 2014-09-13 ENCOUNTER — Encounter: Payer: Self-pay | Admitting: Radiation Oncology

## 2014-09-13 ENCOUNTER — Ambulatory Visit
Admission: RE | Admit: 2014-09-13 | Discharge: 2014-09-13 | Disposition: A | Discharge status: Home / Self Care | Payer: BC Managed Care – PPO | Source: Ambulatory Visit | Attending: Radiation Oncology | Admitting: Radiation Oncology

## 2014-09-13 VITALS — BP 107/70 | HR 88 | Temp 98.1°F | Resp 16 | Ht 64.75 in | Wt 123.3 lb

## 2014-09-13 DIAGNOSIS — C3492 Malignant neoplasm of unspecified part of left bronchus or lung: Secondary | ICD-10-CM | POA: Diagnosis not present

## 2014-09-13 DIAGNOSIS — Z510 Encounter for antineoplastic radiation therapy: Secondary | ICD-10-CM | POA: Insufficient documentation

## 2014-09-13 MED ORDER — BIAFINE EX EMUL
Freq: Once | CUTANEOUS | Status: AC
  Administered 2014-09-13: 16:00:00 via TOPICAL

## 2014-09-13 NOTE — Addendum Note (Signed)
Encounter addended by: Jacqulyn Liner, RN on: 09/13/2014  4:04 PM<BR>     Documentation filed: Inpatient MAR

## 2014-09-13 NOTE — Progress Notes (Signed)
  Radiation Oncology         [phone] ________________________________  Name: Sherri Stafford MRN: [id number]  Date: 09/13/2014  DOB: 12-05-1950  Weekly Radiation Therapy Management  DIAGNOSIS: stage IV (T2a, N3, M1b) non-small cell lung cancer  Current Dose: 10 Gy     Planned Dose:  35 Gy  Narrative . . . . . . . . The patient presents for routine under treatment assessment.                                   The patient is without complaint.  She reports her throat is sore but this started before radiation. She reports a postnasal drip as the etiology.  She also reports having a dry mouth. She has been given biafine cream and was instructed to apply it to her left neck/chest area twice a day, after treatment and bedtime.                                 Set-up films were reviewed.                                 The chart was checked. Physical Findings. . .  height is 5' 4.75" (1.645 m) and weight is 123 lb 4.8 oz (55.929 kg). Her oral temperature is 98.1 F (36.7 C). Her blood pressure is 107/70 and her pulse is 88. Her respiration is 16 and oxygen saturation is 99%. . The lungs are clear. The heart has a regular rhythm and rate. The left supraclavicular mass is unchanged on exam today. No infection noted in the oral cavity or posterior pharynx Impression . . . . . . . The patient is tolerating radiation. Plan . . . . . . . . . . . . Continue treatment as planned.  ________________________________   Blair Promise, PhD, MD  kk

## 2014-09-13 NOTE — Progress Notes (Signed)
Cierrah Dace has completed 3 fractions to her left subclavian area.  She reports her throat is sore but this started before radiation.  She also reports having a dry mouth.  She has been given biafine cream and was instructed to apply it to her left neck/chest area twice a day, after treatment and bedtime.  Also reviewed potential side effects including fatigue, skin irritation and throat/mouth irritation.  BP 107/70 mmHg  Pulse 88  Temp(Src) 98.1 F (36.7 C) (Oral)  Resp 16  Ht 5' 4.75" (1.645 m)  Wt 123 lb 4.8 oz (55.929 kg)  BMI 20.67 kg/m2  SpO2 99%  LMP 01/14/2008

## 2014-09-14 ENCOUNTER — Telehealth: Payer: Self-pay | Admitting: Internal Medicine

## 2014-09-14 ENCOUNTER — Ambulatory Visit
Admission: RE | Admit: 2014-09-14 | Discharge: 2014-09-14 | Disposition: A | Discharge status: Home / Self Care | Payer: BC Managed Care – PPO | Source: Ambulatory Visit | Attending: Radiation Oncology | Admitting: Radiation Oncology

## 2014-09-14 ENCOUNTER — Ambulatory Visit (HOSPITAL_BASED_OUTPATIENT_CLINIC_OR_DEPARTMENT_OTHER): Payer: BC Managed Care – PPO

## 2014-09-14 ENCOUNTER — Encounter: Payer: Self-pay | Admitting: Physician Assistant

## 2014-09-14 ENCOUNTER — Ambulatory Visit (HOSPITAL_BASED_OUTPATIENT_CLINIC_OR_DEPARTMENT_OTHER): Payer: BC Managed Care – PPO | Admitting: Physician Assistant

## 2014-09-14 ENCOUNTER — Other Ambulatory Visit (HOSPITAL_BASED_OUTPATIENT_CLINIC_OR_DEPARTMENT_OTHER): Payer: BC Managed Care – PPO

## 2014-09-14 VITALS — BP 110/63 | HR 95 | Temp 98.0°F | Resp 18 | Ht 64.0 in | Wt 123.0 lb

## 2014-09-14 DIAGNOSIS — C770 Secondary and unspecified malignant neoplasm of lymph nodes of head, face and neck: Secondary | ICD-10-CM | POA: Diagnosis not present

## 2014-09-14 DIAGNOSIS — C3432 Malignant neoplasm of lower lobe, left bronchus or lung: Secondary | ICD-10-CM | POA: Diagnosis not present

## 2014-09-14 DIAGNOSIS — C3490 Malignant neoplasm of unspecified part of unspecified bronchus or lung: Secondary | ICD-10-CM

## 2014-09-14 DIAGNOSIS — C787 Secondary malignant neoplasm of liver and intrahepatic bile duct: Secondary | ICD-10-CM

## 2014-09-14 DIAGNOSIS — C7931 Secondary malignant neoplasm of brain: Secondary | ICD-10-CM

## 2014-09-14 DIAGNOSIS — C7951 Secondary malignant neoplasm of bone: Secondary | ICD-10-CM

## 2014-09-14 DIAGNOSIS — R21 Rash and other nonspecific skin eruption: Secondary | ICD-10-CM

## 2014-09-14 DIAGNOSIS — M25552 Pain in left hip: Secondary | ICD-10-CM

## 2014-09-14 DIAGNOSIS — R109 Unspecified abdominal pain: Secondary | ICD-10-CM

## 2014-09-14 DIAGNOSIS — Z510 Encounter for antineoplastic radiation therapy: Secondary | ICD-10-CM | POA: Diagnosis not present

## 2014-09-14 DIAGNOSIS — Z95828 Presence of other vascular implants and grafts: Secondary | ICD-10-CM

## 2014-09-14 DIAGNOSIS — R1032 Left lower quadrant pain: Secondary | ICD-10-CM

## 2014-09-14 DIAGNOSIS — M542 Cervicalgia: Secondary | ICD-10-CM

## 2014-09-14 DIAGNOSIS — Z5111 Encounter for antineoplastic chemotherapy: Secondary | ICD-10-CM

## 2014-09-14 DIAGNOSIS — M545 Low back pain: Secondary | ICD-10-CM

## 2014-09-14 LAB — CBC WITH DIFFERENTIAL/PLATELET
BASO%: 0.3 % (ref 0.0–2.0)
Basophils Absolute: 0 10*3/uL (ref 0.0–0.1)
EOS%: 9.3 % — AB (ref 0.0–7.0)
Eosinophils Absolute: 0.5 10*3/uL (ref 0.0–0.5)
HCT: 33 % — ABNORMAL LOW (ref 34.8–46.6)
HEMOGLOBIN: 10.7 g/dL — AB (ref 11.6–15.9)
LYMPH#: 0.7 10*3/uL — AB (ref 0.9–3.3)
LYMPH%: 11.7 % — ABNORMAL LOW (ref 14.0–49.7)
MCH: 27.4 pg (ref 25.1–34.0)
MCHC: 32.4 g/dL (ref 31.5–36.0)
MCV: 84.4 fL (ref 79.5–101.0)
MONO#: 0.4 10*3/uL (ref 0.1–0.9)
MONO%: 7.4 % (ref 0.0–14.0)
NEUT#: 4.2 10*3/uL (ref 1.5–6.5)
NEUT%: 71.3 % (ref 38.4–76.8)
PLATELETS: 172 10*3/uL (ref 145–400)
RBC: 3.91 10*6/uL (ref 3.70–5.45)
RDW: 15.5 % — ABNORMAL HIGH (ref 11.2–14.5)
WBC: 5.8 10*3/uL (ref 3.9–10.3)

## 2014-09-14 LAB — COMPREHENSIVE METABOLIC PANEL (CC13)
ALK PHOS: 69 U/L (ref 40–150)
ALT: 12 U/L (ref 0–55)
ANION GAP: 10 meq/L (ref 3–11)
AST: 17 U/L (ref 5–34)
Albumin: 3.4 g/dL — ABNORMAL LOW (ref 3.5–5.0)
BUN: 14 mg/dL (ref 7.0–26.0)
CALCIUM: 9 mg/dL (ref 8.4–10.4)
CO2: 24 mEq/L (ref 22–29)
Chloride: 105 mEq/L (ref 98–109)
Creatinine: 0.8 mg/dL (ref 0.6–1.1)
EGFR: 82 mL/min/{1.73_m2} — AB (ref >90)
Glucose: 119 mg/dl (ref 70–140)
Potassium: 4.2 mEq/L (ref 3.5–5.1)
SODIUM: 138 meq/L (ref 136–145)
Total Bilirubin: 0.85 mg/dL (ref 0.20–1.20)
Total Protein: 6.7 g/dL (ref 6.4–8.3)

## 2014-09-14 MED ORDER — HEPARIN SOD (PORK) LOCK FLUSH 100 UNIT/ML IV SOLN
500.0000 [IU] | Freq: Once | INTRAVENOUS | Status: AC
  Administered 2014-09-14: 500 [IU] via INTRAVENOUS
  Filled 2014-09-14: qty 5

## 2014-09-14 MED ORDER — SODIUM CHLORIDE 0.9 % IJ SOLN
10.0000 mL | INTRAMUSCULAR | Status: DC | PRN
  Administered 2014-09-14: 10 mL via INTRAVENOUS
  Filled 2014-09-14: qty 10

## 2014-09-14 NOTE — Telephone Encounter (Signed)
Gave and printed appt sched for [employer]

## 2014-09-14 NOTE — Progress Notes (Addendum)
[place] Telephone:(336) [phone]   Fax:(336) [phone]  OFFICE PROGRESS NOTE  [name], MD [place] [address]  DIAGNOSIS: stage IV (T2a, N3, M1b) non-small cell lung cancer, adenocarcinoma presented with large left lower lobe lung mass in addition to bilateral pulmonary nodules and mediastinal and supraclavicular lymphadenopathy diagnosed in May of 2015.  Genomic Alterations Identified? ERBB3 amplification CDK4 amplification IDH2 R172S KRAS G12D MDM2 amplification RBM10 [id number] TERC amplification - equivocal? Additional Disease-relevant Genes with No Reportable Alterations Identified? RET ALK BRAF ERBB2 MET EGFR  PRIOR THERAPY:  1) status post palliative [facility]  status post stereotactic radiotherapy to brain lesions under the care of Dr. KKatherine Roanat DLamaron 11/17/2013 2) Systemic chemotherapy with carboplatin for AUC of 5, Alimta 500 mg/M2 and Avastin 15 mg/KG every 3 weeks, status post 5 cycles. [facility] 3)  immunotherapy with Nivolmab at 3 mg/kg given every 3 weeks. Status post 16 cycles discontinued today secondary to disease progression.  CURRENT THERAPY:  1) Gilotrif 40 mg by mouth daily for a patient with positive ERBB3 amplification. Therapy starting 08/29/2014 2) Xgeva 120 mcg subcutaneously every 2 months.  INTERVAL HISTORY: Sherri [id number].[id number]. female returns to the clinic today for follow-up visit accompanied by her boyfriend. The patient is feeling well today except for continued left hip/groin pain. She is currently on fentanyl patch and tramadol for pain. She started treatment with Gilotrif 40 mg by mouth daily on 08/29/2014. She reports a few episodes of diarrhea. She developed a skin rash  primarily on her nose and checks consisting of "explosive pimples". The rash has calmed down a bit.  She has no fever or chills, no nausea or vomiting. She denied having any significant chest pain, shortness of breath, cough or hemoptysis. She is otherwise tolerating her current treatment with Gilotrif fairly well. She complains of sinus tenderness. She reports a long history of sinusitis.   MEDICAL HISTORY: Past Medical History  Diagnosis Date  . Arthritis   . Bilateral ovarian cysts   . GERD (gastroesophageal reflux disease)   . Strain of hip flexor 06/2013    left side torn  . Radiation     at [facility] to left hip, sacrum and brain  . Radiation 02/10/14-03/06/14    left central chest 35 gray  . Cancer     lung ca  . Bone metastases     to left hip and spine    ALLERGIES:  is allergic to other; ketoconazole; lorazepam; morphine and related; nystatin; sporanox; tylenol; vitamin d; and zofran.  MEDICATIONS:  Current Outpatient Prescriptions  Medication Sig Dispense Refill  . afatinib dimaleate (GILOTRIF) 40 MG tablet Take 1 tablet (40 mg total) by mouth daily. Take on an empty stomach 1hr before or 2 hrs after meals. 30 tablet 2  . albuterol (PROVENTIL HFA;VENTOLIN HFA) 108 (90 BASE) MCG/ACT inhaler Inhale 1-2 puffs into the lungs every 6 (six) hours as needed for wheezing or shortness of breath. 1 Inhaler 2  . bisacodyl (DULCOLAX) 10 MG suppository Place 1 suppository (10 mg total) rectally daily as needed for moderate constipation. 10 suppository 0  . calcium carbonate (OS-CAL) 1250 MG chewable tablet Chew 4 tablets by mouth daily.    . codeine 30 MG tablet Take  1 tablet (30 mg total) by mouth every 4 (four) hours as needed (cough). 80 tablet 0  . [employer] SENNA PLUS 8.6-50 MG per tablet   11  . diphenhydrAMINE (SOMINEX) 25 MG tablet Take 25 mg by mouth at bedtime as needed for sleep. Takes 2 tablets every 3-4 hours during the night.    . docusate sodium (COLACE) 100 MG capsule Take 100 mg by  mouth 2 (two) times daily.    Marland Kitchen dronabinol (MARINOL) 2.5 MG capsule Take 1 capsule (2.5 mg total) by mouth 2 (two) times daily before a meal. 60 capsule 0  . esomeprazole (NEXIUM) 40 MG capsule Take 1 capsule (40 mg total) by mouth 2 (two) times daily before a meal. 60 capsule 0  . fentaNYL (DURAGESIC - DOSED MCG/HR) 50 MCG/HR Place 1 patch (50 mcg total) onto the skin every 3 (three) days. 10 patch 0  . glycerin adult 2 G SUPP Place 1 suppository rectally once as needed for moderate constipation.    . naproxen sodium (ANAPROX) 220 MG tablet Take 220 mg by mouth 2 (two) times daily with a meal.    . polyethylene glycol (MIRALAX / GLYCOLAX) packet Take 17 g by mouth daily as needed for moderate constipation.    . prochlorperazine (COMPAZINE) 10 MG tablet Take 1 tablet (10 mg total) by mouth every 6 (six) hours as needed for nausea or vomiting. 30 tablet 1  . rivaroxaban (XARELTO) 20 MG TABS tablet Take 1 tablet (20 mg total) by mouth daily with supper. 30 tablet 5  . traMADol (ULTRAM) 50 MG tablet TAKE 1 TABLET BY MOUTH EVERY 6 HOURS AS NEEDED 60 tablet 0   No current facility-administered medications for this visit.   Facility-Administered Medications Ordered in Other Visits  Medication Dose Route Frequency Provider Last Rate Last Dose  . sodium chloride 0.9 % injection 10 mL  10 mL Intracatheter PRN Curt Bears, MD   10 mL at 04/07/14 1200    SURGICAL HISTORY:  Past Surgical History  Procedure Laterality Date  . Dilation and curettage of uterus  2004    x 3   . Tubal ligation    . Colonoscopy  04/05/2004    normal     REVIEW OF SYSTEMS:  Constitutional: negative Eyes: negative Ears, nose, mouth, throat, and face: positive for sinus tenderness and congestion Respiratory: negative Cardiovascular: negative Gastrointestinal: negative Genitourinary:negative Integument/breast: positive for rash Hematologic/lymphatic: negative Musculoskeletal:positive for bone pain, neck pain and  left groin/hip pain Neurological: negative Behavioral/Psych: negative Endocrine: negative Allergic/Immunologic: negative   PHYSICAL EXAMINATION: General appearance: alert, cooperative, fatigued and no distress Head: Normocephalic, without obvious abnormality, atraumatic Neck: no adenopathy, no JVD, supple, symmetrical, trachea midline and thyroid not enlarged, symmetric, no tenderness/mass/nodules Lymph nodes: Cervical, supraclavicular, and axillary nodes normal. Resp: clear to auscultation bilaterally Back: symmetric, no curvature. ROM normal. No CVA tenderness. Cardio: regular rate and rhythm, S1, S2 normal, no murmur, click, rub or gallop GI: soft, non-tender; bowel sounds normal; no masses,  no organomegaly Extremities: extremities normal, atraumatic, no cyanosis or edema Neurologic: Alert and oriented X 3, normal strength and tone. Normal symmetric reflexes. Normal coordination and gait Skin: grade 1-2 acneform rash across the nose and cheeks with dry skin. No evidence of infection. Bilateral maxillary sinus tenderness, right greater than left  ECOG PERFORMANCE STATUS: 1 - Symptomatic but completely ambulatory  Blood pressure 110/63, pulse 95, temperature 98 F (36.7 C), temperature source Oral, resp. rate 18, height '5\' 4"'  (1.626 m), weight 123  lb (55.792 kg), last menstrual period 01/14/2008, SpO2 100 %.  LABORATORY DATA: Lab Results  Component Value Date   WBC 5.8 09/14/2014   HGB 10.7* 09/14/2014   HCT 33.0* 09/14/2014   MCV 84.4 09/14/2014   PLT 172 09/14/2014      Chemistry      Component Value Date/Time   NA 138 09/14/2014 1046   NA 137 07/28/2014 0916   K 4.2 09/14/2014 1046   K 4.4 07/28/2014 0916   CL 104 07/28/2014 0916   CO2 24 09/14/2014 1046   CO2 28 07/28/2014 0916   BUN 14.0 09/14/2014 1046   BUN 13 07/28/2014 0916   CREATININE 0.8 09/14/2014 1046   CREATININE 0.85 07/28/2014 0916   CREATININE 0.83 09/07/2013 1506      Component Value Date/Time    CALCIUM 9.0 09/14/2014 1046   CALCIUM 8.8 07/28/2014 0916   ALKPHOS 69 09/14/2014 1046   ALKPHOS 49 07/28/2014 0916   AST 17 09/14/2014 1046   AST 21 07/28/2014 0916   ALT 12 09/14/2014 1046   ALT 12 07/28/2014 0916   BILITOT 0.85 09/14/2014 1046   BILITOT 0.9 07/28/2014 0916       RADIOGRAPHIC STUDIES: Ct Soft Tissue Neck W Contrast  08/22/2014   CLINICAL DATA:  Lung cancer diagnosed 08/25/2013. Metastatic disease to bone. Chemotherapy in progress. Radiation therapy complete 2015. Subsequent treatment strategy.  EXAM: CT OF THE NECK WITH CONTRAST  CT OF THE CHEST WITH CONTRAST  CT OF THE ABDOMEN AND PELVIS WITH CONTRAST  TECHNIQUE: Multidetector CT imaging of the neck was performed with intravenous contrast.; Multidetector CT imaging of the abdomen and pelvis was performed following the standard protocol during bolus administration of intravenous contrast.; Multidetector CT imaging of the chest was performed following the standard protocol during bolus administration of intravenous contrast.  CONTRAST:  141m OMNIPAQUE IOHEXOL 300 MG/ML  SOLN  COMPARISON:  CT 06/27/2014, PET-CT 09/09/2013  FINDINGS: CT NECK FINDINGS  Left supraclavicular lymph node is increased in volume measuring 3.8 x 3.5 cm increased from 1.8 by 1.6 cm on CT 06/27/2014. Interval increase in right supraclavicular lymph node measuring 1.3 cm (image 66, series 7) compared to 0.8 cm.  No additional metastatic lymph nodes identified superior to the supraclavicular nodes. The glottis and supraglottic tissues superior normal. The salivary glands are normal.  Limited view of the base of the brain and orbits are normal.  A sclerotic lesion at C1 on the right (image 34 of sagittal series) and C3 lesion on image 41, series 7 are slightly more prominent.  CT CHEST FINDINGS  Mediastinum/Nodes: There is a port in the right chest wall no axillary adenopathy. Supraclavicular adenopathy described in the neck findings.  Lungs/Pleura: Bilateral  pulmonary nodules again demonstrated and appear stable. Example nodule in the left upper lobe measures 13 mm x 15 mm (image 24, series 4) compared to 15 mm x 16 mm  Example nodule in the left upper lobe measures 9 mm on image 15 compared to 9 mm on prior  Example nodule the right middle lobe measures 16 mm by 9 mm compared 18 mm x 10 mm. No new pulmonary nodules are identified.  There is a moderate to large left pleural effusion not changed significantly in volume.  The left lower lobe is atelectatic within this large pleural effusion. There are several low-density lesions within the atelectatic lung which are not changed from comparison exam (image 34, series 2).  CT ABDOMEN AND PELVIS FINDINGS  Hepatobiliary: The  are several ill-defined lesions in the subcapsular right hepatic lobe which are not changed from prior. Example 12 mm x 9 mm lesion on image 54, series 2 compares to 15 mm x 11 mm. 11 mm lesion on image 55 compares to 12 mm on prior. Small is 6 mm lesion on image 59. No new lesions are identified.  Pancreas: Pancreas is normal. No ductal dilatation. No pancreatic inflammation.  Spleen: Low-density lobular lesion the spleen is increased significantly measuring 31 mm compared 13 mm (image 60, series 2).  Adrenals/urinary tract: Adrenal glands and kidneys are normal. The ureters and bladder normal.  Stomach/Bowel: Stomach, small bowel, appendix, and cecum are normal. The colon and rectosigmoid colon are normal.  Vascular/Lymphatic: Mild interval increase in size of left periaortic retroperitoneal lymph nodes. For example 13 mm node on image 68, series 2 is increased from 8 mm. Adjacent necrotic 14 mm node on image 71 is increased from 13 mm. 9 mm aortocaval node same level is increased from 4 mm on prior.  Two adjacent periportal lymph nodes are again noted. The more lateral lymph node is increased in size measuring 15 mm (image 58, series 2) compared to 10 mm on prior. The larger 16 mm adjacent node is not  changed.  Reproductive: Uterus and ovaries are normal.  Musculoskeletal: Multiple lytic and sclerotic lesions throughout the pelvis and spine are not changed in the interval.  Other: No peritoneal metastasis evident.  IMPRESSION: Neck impression:  1. Interval increase in bilateral supraclavicular adenopathy. The left supraclavicular node is large at 3.5 cm.  Chest Impression:  1. Bilateral pulmonary nodular metastasis are unchanged. 2. Mediastinal adenopathy is unchanged. 3. Large left effusion with left lower lobe atelectasis is unchanged. Low-density lesions within the atelectatic left lung are stable.  Abdomen / Pelvis Impression:  1. Interval increase in size of a splenic metastasis. 2. Stable ill-defined liver metastases. 3. Interval increase in periportal and periaortic metastatic adenopathy. 4. Stable skeletal metastasis.   Electronically Signed   By: Suzy Bouchard M.D.   On: 08/22/2014 09:08   Ct Chest W Contrast  08/22/2014   CLINICAL DATA:  Lung cancer diagnosed 08/25/2013. Metastatic disease to bone. Chemotherapy in progress. Radiation therapy complete 2015. Subsequent treatment strategy.  EXAM: CT OF THE NECK WITH CONTRAST  CT OF THE CHEST WITH CONTRAST  CT OF THE ABDOMEN AND PELVIS WITH CONTRAST  TECHNIQUE: Multidetector CT imaging of the neck was performed with intravenous contrast.; Multidetector CT imaging of the abdomen and pelvis was performed following the standard protocol during bolus administration of intravenous contrast.; Multidetector CT imaging of the chest was performed following the standard protocol during bolus administration of intravenous contrast.  CONTRAST:  174m OMNIPAQUE IOHEXOL 300 MG/ML  SOLN  COMPARISON:  CT 06/27/2014, PET-CT 09/09/2013  FINDINGS: CT NECK FINDINGS  Left supraclavicular lymph node is increased in volume measuring 3.8 x 3.5 cm increased from 1.8 by 1.6 cm on CT 06/27/2014. Interval increase in right supraclavicular lymph node measuring 1.3 cm (image 66,  series 7) compared to 0.8 cm.  No additional metastatic lymph nodes identified superior to the supraclavicular nodes. The glottis and supraglottic tissues superior normal. The salivary glands are normal.  Limited view of the base of the brain and orbits are normal.  A sclerotic lesion at C1 on the right (image 34 of sagittal series) and C3 lesion on image 41, series 7 are slightly more prominent.  CT CHEST FINDINGS  Mediastinum/Nodes: There is a port in the right  chest wall no axillary adenopathy. Supraclavicular adenopathy described in the neck findings.  Lungs/Pleura: Bilateral pulmonary nodules again demonstrated and appear stable. Example nodule in the left upper lobe measures 13 mm x 15 mm (image 24, series 4) compared to 15 mm x 16 mm  Example nodule in the left upper lobe measures 9 mm on image 15 compared to 9 mm on prior  Example nodule the right middle lobe measures 16 mm by 9 mm compared 18 mm x 10 mm. No new pulmonary nodules are identified.  There is a moderate to large left pleural effusion not changed significantly in volume.  The left lower lobe is atelectatic within this large pleural effusion. There are several low-density lesions within the atelectatic lung which are not changed from comparison exam (image 34, series 2).  CT ABDOMEN AND PELVIS FINDINGS  Hepatobiliary: The are several ill-defined lesions in the subcapsular right hepatic lobe which are not changed from prior. Example 12 mm x 9 mm lesion on image 54, series 2 compares to 15 mm x 11 mm. 11 mm lesion on image 55 compares to 12 mm on prior. Small is 6 mm lesion on image 59. No new lesions are identified.  Pancreas: Pancreas is normal. No ductal dilatation. No pancreatic inflammation.  Spleen: Low-density lobular lesion the spleen is increased significantly measuring 31 mm compared 13 mm (image 60, series 2).  Adrenals/urinary tract: Adrenal glands and kidneys are normal. The ureters and bladder normal.  Stomach/Bowel: Stomach, small  bowel, appendix, and cecum are normal. The colon and rectosigmoid colon are normal.  Vascular/Lymphatic: Mild interval increase in size of left periaortic retroperitoneal lymph nodes. For example 13 mm node on image 68, series 2 is increased from 8 mm. Adjacent necrotic 14 mm node on image 71 is increased from 13 mm. 9 mm aortocaval node same level is increased from 4 mm on prior.  Two adjacent periportal lymph nodes are again noted. The more lateral lymph node is increased in size measuring 15 mm (image 58, series 2) compared to 10 mm on prior. The larger 16 mm adjacent node is not changed.  Reproductive: Uterus and ovaries are normal.  Musculoskeletal: Multiple lytic and sclerotic lesions throughout the pelvis and spine are not changed in the interval.  Other: No peritoneal metastasis evident.  IMPRESSION: Neck impression:  1. Interval increase in bilateral supraclavicular adenopathy. The left supraclavicular node is large at 3.5 cm.  Chest Impression:  1. Bilateral pulmonary nodular metastasis are unchanged. 2. Mediastinal adenopathy is unchanged. 3. Large left effusion with left lower lobe atelectasis is unchanged. Low-density lesions within the atelectatic left lung are stable.  Abdomen / Pelvis Impression:  1. Interval increase in size of a splenic metastasis. 2. Stable ill-defined liver metastases. 3. Interval increase in periportal and periaortic metastatic adenopathy. 4. Stable skeletal metastasis.   Electronically Signed   By: Suzy Bouchard M.D.   On: 08/22/2014 09:08   Ct Abdomen Pelvis W Contrast  08/22/2014   CLINICAL DATA:  Lung cancer diagnosed 08/25/2013. Metastatic disease to bone. Chemotherapy in progress. Radiation therapy complete 2015. Subsequent treatment strategy.  EXAM: CT OF THE NECK WITH CONTRAST  CT OF THE CHEST WITH CONTRAST  CT OF THE ABDOMEN AND PELVIS WITH CONTRAST  TECHNIQUE: Multidetector CT imaging of the neck was performed with intravenous contrast.; Multidetector CT imaging  of the abdomen and pelvis was performed following the standard protocol during bolus administration of intravenous contrast.; Multidetector CT imaging of the chest was performed following  the standard protocol during bolus administration of intravenous contrast.  CONTRAST:  161m OMNIPAQUE IOHEXOL 300 MG/ML  SOLN  COMPARISON:  CT 06/27/2014, PET-CT 09/09/2013  FINDINGS: CT NECK FINDINGS  Left supraclavicular lymph node is increased in volume measuring 3.8 x 3.5 cm increased from 1.8 by 1.6 cm on CT 06/27/2014. Interval increase in right supraclavicular lymph node measuring 1.3 cm (image 66, series 7) compared to 0.8 cm.  No additional metastatic lymph nodes identified superior to the supraclavicular nodes. The glottis and supraglottic tissues superior normal. The salivary glands are normal.  Limited view of the base of the brain and orbits are normal.  A sclerotic lesion at C1 on the right (image 34 of sagittal series) and C3 lesion on image 41, series 7 are slightly more prominent.  CT CHEST FINDINGS  Mediastinum/Nodes: There is a port in the right chest wall no axillary adenopathy. Supraclavicular adenopathy described in the neck findings.  Lungs/Pleura: Bilateral pulmonary nodules again demonstrated and appear stable. Example nodule in the left upper lobe measures 13 mm x 15 mm (image 24, series 4) compared to 15 mm x 16 mm  Example nodule in the left upper lobe measures 9 mm on image 15 compared to 9 mm on prior  Example nodule the right middle lobe measures 16 mm by 9 mm compared 18 mm x 10 mm. No new pulmonary nodules are identified.  There is a moderate to large left pleural effusion not changed significantly in volume.  The left lower lobe is atelectatic within this large pleural effusion. There are several low-density lesions within the atelectatic lung which are not changed from comparison exam (image 34, series 2).  CT ABDOMEN AND PELVIS FINDINGS  Hepatobiliary: The are several ill-defined lesions in the  subcapsular right hepatic lobe which are not changed from prior. Example 12 mm x 9 mm lesion on image 54, series 2 compares to 15 mm x 11 mm. 11 mm lesion on image 55 compares to 12 mm on prior. Small is 6 mm lesion on image 59. No new lesions are identified.  Pancreas: Pancreas is normal. No ductal dilatation. No pancreatic inflammation.  Spleen: Low-density lobular lesion the spleen is increased significantly measuring 31 mm compared 13 mm (image 60, series 2).  Adrenals/urinary tract: Adrenal glands and kidneys are normal. The ureters and bladder normal.  Stomach/Bowel: Stomach, small bowel, appendix, and cecum are normal. The colon and rectosigmoid colon are normal.  Vascular/Lymphatic: Mild interval increase in size of left periaortic retroperitoneal lymph nodes. For example 13 mm node on image 68, series 2 is increased from 8 mm. Adjacent necrotic 14 mm node on image 71 is increased from 13 mm. 9 mm aortocaval node same level is increased from 4 mm on prior.  Two adjacent periportal lymph nodes are again noted. The more lateral lymph node is increased in size measuring 15 mm (image 58, series 2) compared to 10 mm on prior. The larger 16 mm adjacent node is not changed.  Reproductive: Uterus and ovaries are normal.  Musculoskeletal: Multiple lytic and sclerotic lesions throughout the pelvis and spine are not changed in the interval.  Other: No peritoneal metastasis evident.  IMPRESSION: Neck impression:  1. Interval increase in bilateral supraclavicular adenopathy. The left supraclavicular node is large at 3.5 cm.  Chest Impression:  1. Bilateral pulmonary nodular metastasis are unchanged. 2. Mediastinal adenopathy is unchanged. 3. Large left effusion with left lower lobe atelectasis is unchanged. Low-density lesions within the atelectatic left lung are stable.  Abdomen / Pelvis Impression:  1. Interval increase in size of a splenic metastasis. 2. Stable ill-defined liver metastases. 3. Interval increase in  periportal and periaortic metastatic adenopathy. 4. Stable skeletal metastasis.   Electronically Signed   By: Suzy Bouchard M.D.   On: 08/22/2014 09:08   Ct Soft Tissue Neck W Contrast  08/22/2014   CLINICAL DATA:  Lung cancer diagnosed 08/25/2013. Metastatic disease to bone. Chemotherapy in progress. Radiation therapy complete 2015. Subsequent treatment strategy.  EXAM: CT OF THE NECK WITH CONTRAST  CT OF THE CHEST WITH CONTRAST  CT OF THE ABDOMEN AND PELVIS WITH CONTRAST  TECHNIQUE: Multidetector CT imaging of the neck was performed with intravenous contrast.; Multidetector CT imaging of the abdomen and pelvis was performed following the standard protocol during bolus administration of intravenous contrast.; Multidetector CT imaging of the chest was performed following the standard protocol during bolus administration of intravenous contrast.  CONTRAST:  151m OMNIPAQUE IOHEXOL 300 MG/ML  SOLN  COMPARISON:  CT 06/27/2014, PET-CT 09/09/2013  FINDINGS: CT NECK FINDINGS  Left supraclavicular lymph node is increased in volume measuring 3.8 x 3.5 cm increased from 1.8 by 1.6 cm on CT 06/27/2014. Interval increase in right supraclavicular lymph node measuring 1.3 cm (image 66, series 7) compared to 0.8 cm.  No additional metastatic lymph nodes identified superior to the supraclavicular nodes. The glottis and supraglottic tissues superior normal. The salivary glands are normal.  Limited view of the base of the brain and orbits are normal.  A sclerotic lesion at C1 on the right (image 34 of sagittal series) and C3 lesion on image 41, series 7 are slightly more prominent.  CT CHEST FINDINGS  Mediastinum/Nodes: There is a port in the right chest wall no axillary adenopathy. Supraclavicular adenopathy described in the neck findings.  Lungs/Pleura: Bilateral pulmonary nodules again demonstrated and appear stable. Example nodule in the left upper lobe measures 13 mm x 15 mm (image 24, series 4) compared to 15 mm x 16 mm   Example nodule in the left upper lobe measures 9 mm on image 15 compared to 9 mm on prior  Example nodule the right middle lobe measures 16 mm by 9 mm compared 18 mm x 10 mm. No new pulmonary nodules are identified.  There is a moderate to large left pleural effusion not changed significantly in volume.  The left lower lobe is atelectatic within this large pleural effusion. There are several low-density lesions within the atelectatic lung which are not changed from comparison exam (image 34, series 2).  CT ABDOMEN AND PELVIS FINDINGS  Hepatobiliary: The are several ill-defined lesions in the subcapsular right hepatic lobe which are not changed from prior. Example 12 mm x 9 mm lesion on image 54, series 2 compares to 15 mm x 11 mm. 11 mm lesion on image 55 compares to 12 mm on prior. Small is 6 mm lesion on image 59. No new lesions are identified.  Pancreas: Pancreas is normal. No ductal dilatation. No pancreatic inflammation.  Spleen: Low-density lobular lesion the spleen is increased significantly measuring 31 mm compared 13 mm (image 60, series 2).  Adrenals/urinary tract: Adrenal glands and kidneys are normal. The ureters and bladder normal.  Stomach/Bowel: Stomach, small bowel, appendix, and cecum are normal. The colon and rectosigmoid colon are normal.  Vascular/Lymphatic: Mild interval increase in size of left periaortic retroperitoneal lymph nodes. For example 13 mm node on image 68, series 2 is increased from 8 mm. Adjacent necrotic 14 mm node on  image 71 is increased from 13 mm. 9 mm aortocaval node same level is increased from 4 mm on prior.  Two adjacent periportal lymph nodes are again noted. The more lateral lymph node is increased in size measuring 15 mm (image 58, series 2) compared to 10 mm on prior. The larger 16 mm adjacent node is not changed.  Reproductive: Uterus and ovaries are normal.  Musculoskeletal: Multiple lytic and sclerotic lesions throughout the pelvis and spine are not changed in the  interval.  Other: No peritoneal metastasis evident.  IMPRESSION: Neck impression:  1. Interval increase in bilateral supraclavicular adenopathy. The left supraclavicular node is large at 3.5 cm.  Chest Impression:  1. Bilateral pulmonary nodular metastasis are unchanged. 2. Mediastinal adenopathy is unchanged. 3. Large left effusion with left lower lobe atelectasis is unchanged. Low-density lesions within the atelectatic left lung are stable.  Abdomen / Pelvis Impression:  1. Interval increase in size of a splenic metastasis. 2. Stable ill-defined liver metastases. 3. Interval increase in periportal and periaortic metastatic adenopathy. 4. Stable skeletal metastasis.   Electronically Signed   By: Suzy Bouchard M.D.   On: 08/22/2014 09:08   Ct Chest W Contrast  08/22/2014   CLINICAL DATA:  Lung cancer diagnosed 08/25/2013. Metastatic disease to bone. Chemotherapy in progress. Radiation therapy complete 2015. Subsequent treatment strategy.  EXAM: CT OF THE NECK WITH CONTRAST  CT OF THE CHEST WITH CONTRAST  CT OF THE ABDOMEN AND PELVIS WITH CONTRAST  TECHNIQUE: Multidetector CT imaging of the neck was performed with intravenous contrast.; Multidetector CT imaging of the abdomen and pelvis was performed following the standard protocol during bolus administration of intravenous contrast.; Multidetector CT imaging of the chest was performed following the standard protocol during bolus administration of intravenous contrast.  CONTRAST:  175m OMNIPAQUE IOHEXOL 300 MG/ML  SOLN  COMPARISON:  CT 06/27/2014, PET-CT 09/09/2013  FINDINGS: CT NECK FINDINGS  Left supraclavicular lymph node is increased in volume measuring 3.8 x 3.5 cm increased from 1.8 by 1.6 cm on CT 06/27/2014. Interval increase in right supraclavicular lymph node measuring 1.3 cm (image 66, series 7) compared to 0.8 cm.  No additional metastatic lymph nodes identified superior to the supraclavicular nodes. The glottis and supraglottic tissues superior  normal. The salivary glands are normal.  Limited view of the base of the brain and orbits are normal.  A sclerotic lesion at C1 on the right (image 34 of sagittal series) and C3 lesion on image 41, series 7 are slightly more prominent.  CT CHEST FINDINGS  Mediastinum/Nodes: There is a port in the right chest wall no axillary adenopathy. Supraclavicular adenopathy described in the neck findings.  Lungs/Pleura: Bilateral pulmonary nodules again demonstrated and appear stable. Example nodule in the left upper lobe measures 13 mm x 15 mm (image 24, series 4) compared to 15 mm x 16 mm  Example nodule in the left upper lobe measures 9 mm on image 15 compared to 9 mm on prior  Example nodule the right middle lobe measures 16 mm by 9 mm compared 18 mm x 10 mm. No new pulmonary nodules are identified.  There is a moderate to large left pleural effusion not changed significantly in volume.  The left lower lobe is atelectatic within this large pleural effusion. There are several low-density lesions within the atelectatic lung which are not changed from comparison exam (image 34, series 2).  CT ABDOMEN AND PELVIS FINDINGS  Hepatobiliary: The are several ill-defined lesions in the subcapsular right hepatic  lobe which are not changed from prior. Example 12 mm x 9 mm lesion on image 54, series 2 compares to 15 mm x 11 mm. 11 mm lesion on image 55 compares to 12 mm on prior. Small is 6 mm lesion on image 59. No new lesions are identified.  Pancreas: Pancreas is normal. No ductal dilatation. No pancreatic inflammation.  Spleen: Low-density lobular lesion the spleen is increased significantly measuring 31 mm compared 13 mm (image 60, series 2).  Adrenals/urinary tract: Adrenal glands and kidneys are normal. The ureters and bladder normal.  Stomach/Bowel: Stomach, small bowel, appendix, and cecum are normal. The colon and rectosigmoid colon are normal.  Vascular/Lymphatic: Mild interval increase in size of left periaortic  retroperitoneal lymph nodes. For example 13 mm node on image 68, series 2 is increased from 8 mm. Adjacent necrotic 14 mm node on image 71 is increased from 13 mm. 9 mm aortocaval node same level is increased from 4 mm on prior.  Two adjacent periportal lymph nodes are again noted. The more lateral lymph node is increased in size measuring 15 mm (image 58, series 2) compared to 10 mm on prior. The larger 16 mm adjacent node is not changed.  Reproductive: Uterus and ovaries are normal.  Musculoskeletal: Multiple lytic and sclerotic lesions throughout the pelvis and spine are not changed in the interval.  Other: No peritoneal metastasis evident.  IMPRESSION: Neck impression:  1. Interval increase in bilateral supraclavicular adenopathy. The left supraclavicular node is large at 3.5 cm.  Chest Impression:  1. Bilateral pulmonary nodular metastasis are unchanged. 2. Mediastinal adenopathy is unchanged. 3. Large left effusion with left lower lobe atelectasis is unchanged. Low-density lesions within the atelectatic left lung are stable.  Abdomen / Pelvis Impression:  1. Interval increase in size of a splenic metastasis. 2. Stable ill-defined liver metastases. 3. Interval increase in periportal and periaortic metastatic adenopathy. 4. Stable skeletal metastasis.   Electronically Signed   By: Suzy Bouchard M.D.   On: 08/22/2014 09:08   Ct Abdomen Pelvis W Contrast  08/22/2014   CLINICAL DATA:  Lung cancer diagnosed 08/25/2013. Metastatic disease to bone. Chemotherapy in progress. Radiation therapy complete 2015. Subsequent treatment strategy.  EXAM: CT OF THE NECK WITH CONTRAST  CT OF THE CHEST WITH CONTRAST  CT OF THE ABDOMEN AND PELVIS WITH CONTRAST  TECHNIQUE: Multidetector CT imaging of the neck was performed with intravenous contrast.; Multidetector CT imaging of the abdomen and pelvis was performed following the standard protocol during bolus administration of intravenous contrast.; Multidetector CT imaging of  the chest was performed following the standard protocol during bolus administration of intravenous contrast.  CONTRAST:  139m OMNIPAQUE IOHEXOL 300 MG/ML  SOLN  COMPARISON:  CT 06/27/2014, PET-CT 09/09/2013  FINDINGS: CT NECK FINDINGS  Left supraclavicular lymph node is increased in volume measuring 3.8 x 3.5 cm increased from 1.8 by 1.6 cm on CT 06/27/2014. Interval increase in right supraclavicular lymph node measuring 1.3 cm (image 66, series 7) compared to 0.8 cm.  No additional metastatic lymph nodes identified superior to the supraclavicular nodes. The glottis and supraglottic tissues superior normal. The salivary glands are normal.  Limited view of the base of the brain and orbits are normal.  A sclerotic lesion at C1 on the right (image 34 of sagittal series) and C3 lesion on image 41, series 7 are slightly more prominent.  CT CHEST FINDINGS  Mediastinum/Nodes: There is a port in the right chest wall no axillary adenopathy. Supraclavicular adenopathy described  in the neck findings.  Lungs/Pleura: Bilateral pulmonary nodules again demonstrated and appear stable. Example nodule in the left upper lobe measures 13 mm x 15 mm (image 24, series 4) compared to 15 mm x 16 mm  Example nodule in the left upper lobe measures 9 mm on image 15 compared to 9 mm on prior  Example nodule the right middle lobe measures 16 mm by 9 mm compared 18 mm x 10 mm. No new pulmonary nodules are identified.  There is a moderate to large left pleural effusion not changed significantly in volume.  The left lower lobe is atelectatic within this large pleural effusion. There are several low-density lesions within the atelectatic lung which are not changed from comparison exam (image 34, series 2).  CT ABDOMEN AND PELVIS FINDINGS  Hepatobiliary: The are several ill-defined lesions in the subcapsular right hepatic lobe which are not changed from prior. Example 12 mm x 9 mm lesion on image 54, series 2 compares to 15 mm x 11 mm. 11 mm lesion  on image 55 compares to 12 mm on prior. Small is 6 mm lesion on image 59. No new lesions are identified.  Pancreas: Pancreas is normal. No ductal dilatation. No pancreatic inflammation.  Spleen: Low-density lobular lesion the spleen is increased significantly measuring 31 mm compared 13 mm (image 60, series 2).  Adrenals/urinary tract: Adrenal glands and kidneys are normal. The ureters and bladder normal.  Stomach/Bowel: Stomach, small bowel, appendix, and cecum are normal. The colon and rectosigmoid colon are normal.  Vascular/Lymphatic: Mild interval increase in size of left periaortic retroperitoneal lymph nodes. For example 13 mm node on image 68, series 2 is increased from 8 mm. Adjacent necrotic 14 mm node on image 71 is increased from 13 mm. 9 mm aortocaval node same level is increased from 4 mm on prior.  Two adjacent periportal lymph nodes are again noted. The more lateral lymph node is increased in size measuring 15 mm (image 58, series 2) compared to 10 mm on prior. The larger 16 mm adjacent node is not changed.  Reproductive: Uterus and ovaries are normal.  Musculoskeletal: Multiple lytic and sclerotic lesions throughout the pelvis and spine are not changed in the interval.  Other: No peritoneal metastasis evident.  IMPRESSION: Neck impression:  1. Interval increase in bilateral supraclavicular adenopathy. The left supraclavicular node is large at 3.5 cm.  Chest Impression:  1. Bilateral pulmonary nodular metastasis are unchanged. 2. Mediastinal adenopathy is unchanged. 3. Large left effusion with left lower lobe atelectasis is unchanged. Low-density lesions within the atelectatic left lung are stable.  Abdomen / Pelvis Impression:  1. Interval increase in size of a splenic metastasis. 2. Stable ill-defined liver metastases. 3. Interval increase in periportal and periaortic metastatic adenopathy. 4. Stable skeletal metastasis.   Electronically Signed   By: Suzy Bouchard M.D.   On: 08/22/2014 09:08    ASSESSMENT AND PLAN: This is a very pleasant 64 years old white female with metastatic non-small cell lung cancer, adenocarcinoma status post induction chemotherapy with carboplatin and Alimta but unfortunately has evidence for disease progression. She is currently undergoing immunotherapy with Nivolumab status post 16 cycles and tolerating her treatment fairly well. The patient is doing very well with this treatment and she continues to have significant improvement in her symptoms except for the recent left neck and left hip pain. The recent CT scan of the neck, chest, abdomen and pelvis showed interval increase in bilateral supraclavicular adenopathy with a larger left supraclavicular  lymph nodes the bilateral pulmonary nodules, mediastinal lymph nodes as well as a large left effusion are unchanged. There was interval increase in the size of these pain neck metastasis in addition to increase and the periportal and periaortic metastatic lymphadenopathy. Her  treatment with immunotherapy with Nivolumab was discontinued secondary to disease progression. She is currently being treated with Gilotrif 40 mg by mouth daily status post approximately 2 weeks of therapy. Overall she is tolerating the Gilotrif fairly well with the exception of occasional episodes of diarrhea and grade 1-2 skin rash. She has not required Imodium for the diarrhea. For the skin rash, a prescription for clindamycin solution was sent to her pharmacy of record via [place]. A prescription for Amoxicillin 500 mg TID for seven days was also sent to her pharmacy. To further evaluate the left groin.hip pain, she will be scheduled for a MRI of the left hip. For the abdominal and back pain, she will continue on gabapentin and tramadol. For anticoagulation, she will continue on Xarelto. The patient will follow-up in 2 weeks for reevaluation and management of any adverse effect of her treatment. She was advised to call immediately if she has  any other concerning symptoms in the interval. The patient voices understanding of current disease status and treatment options and is in agreement with the current care plan.  All questions were answered. The patient knows to call the clinic with any problems, questions or concerns. We can certainly see the patient much sooner if necessary.  Carlton Adam, PA-C 09/14/2014   ADDENDUM: Hematology/Oncology Attending: I had a face to face encounter with the patient. I recommended her care plan. This is a very pleasant 64 years old white female with metastatic non-small cell lung cancer, adenocarcinoma who was recently treated with immunotherapy with Nivolumab for 16 cycles but this was discontinued secondary to disease progression. She completed a course of palliative radiotherapy to the enlarging right supraclavicular lymph nodes under the care of Dr. Sondra Come. The patient is currently undergoing treatment with Gilotrif 40 mg by mouth daily started 2 weeks ago. She is tolerating her treatment fairly well except for mild skin rash on the nose but no significant diarrhea. She is also complaining of sinus congestion and requested antibiotics. Will start the patient on amoxicillin 500 mg by mouth 3 times a day. For anticoagulation, she will continue on Xarelto and the patient will continue on tramadol and gabapentin for pain management. She would come back for follow-up visit in 2 weeks for reevaluation and management of any adverse effect of her treatment. The patient was advised to call immediately if she has any concerning symptoms in the interval.   Disclaimer: This note was dictated with voice recognition software. Similar sounding words can inadvertently be transcribed and may be missed upon review. Sherri Stafford., MD 09/19/2014

## 2014-09-14 NOTE — Patient Instructions (Signed)

## 2014-09-15 ENCOUNTER — Ambulatory Visit
Admission: RE | Admit: 2014-09-15 | Discharge: 2014-09-15 | Disposition: A | Discharge status: Home / Self Care | Payer: BC Managed Care – PPO | Source: Ambulatory Visit | Attending: Radiation Oncology | Admitting: Radiation Oncology

## 2014-09-15 ENCOUNTER — Telehealth: Payer: Self-pay | Admitting: *Deleted

## 2014-09-15 DIAGNOSIS — Z510 Encounter for antineoplastic radiation therapy: Secondary | ICD-10-CM | POA: Diagnosis not present

## 2014-09-15 MED ORDER — CLINDAMYCIN PHOSPHATE 1 % EX SOLN: Freq: Two times a day (BID) | CUTANEOUS | Status: AC

## 2014-09-15 MED ORDER — AMOXICILLIN 500 MG PO TABS: 500.0000 mg | ORAL_TABLET | Freq: Three times a day (TID) | ORAL | Status: DC

## 2014-09-15 NOTE — Patient Instructions (Signed)
Keep the appointment for the MRI to further evaluate your left hip/groin pain Continue Gilotrif 40 mg by mouth daily Take the Amoxicillin as prescribed for your sinus infection

## 2014-09-15 NOTE — Telephone Encounter (Signed)
Patient called reporting she went to pharmacy for medications and [facility] has not received orders.  Sherri Metro PA-C notified. 10:21 called Sherri Stafford to notify orders sent to [facility].  Reviewed dose and sig.

## 2014-09-16 ENCOUNTER — Ambulatory Visit
Admission: RE | Admit: 2014-09-16 | Discharge: 2014-09-16 | Disposition: A | Discharge status: Home / Self Care | Payer: BC Managed Care – PPO | Source: Ambulatory Visit | Attending: Radiation Oncology | Admitting: Radiation Oncology

## 2014-09-16 DIAGNOSIS — Z510 Encounter for antineoplastic radiation therapy: Secondary | ICD-10-CM | POA: Diagnosis not present

## 2014-09-19 ENCOUNTER — Ambulatory Visit
Admission: RE | Admit: 2014-09-19 | Discharge: 2014-09-19 | Disposition: A | Discharge status: Home / Self Care | Payer: BC Managed Care – PPO | Source: Ambulatory Visit | Attending: Radiation Oncology | Admitting: Radiation Oncology

## 2014-09-19 ENCOUNTER — Encounter: Payer: Self-pay | Admitting: Physician Assistant

## 2014-09-19 DIAGNOSIS — Z5111 Encounter for antineoplastic chemotherapy: Secondary | ICD-10-CM

## 2014-09-19 DIAGNOSIS — Z510 Encounter for antineoplastic radiation therapy: Secondary | ICD-10-CM | POA: Diagnosis not present

## 2014-09-19 HISTORY — DX: Encounter for antineoplastic chemotherapy: Z51.11

## 2014-09-20 ENCOUNTER — Telehealth: Payer: Self-pay | Admitting: *Deleted

## 2014-09-20 ENCOUNTER — Other Ambulatory Visit: Payer: Self-pay | Admitting: Internal Medicine

## 2014-09-20 ENCOUNTER — Other Ambulatory Visit: Payer: Self-pay | Admitting: *Deleted

## 2014-09-20 ENCOUNTER — Encounter: Payer: Self-pay | Admitting: Radiation Oncology

## 2014-09-20 ENCOUNTER — Ambulatory Visit
Admission: RE | Admit: 2014-09-20 | Discharge: 2014-09-20 | Disposition: A | Discharge status: Home / Self Care | Payer: BC Managed Care – PPO | Source: Ambulatory Visit | Attending: Radiation Oncology | Admitting: Radiation Oncology

## 2014-09-20 VITALS — BP 95/69 | HR 94 | Temp 97.9°F | Resp 16 | Ht 64.0 in | Wt 121.9 lb

## 2014-09-20 DIAGNOSIS — C3490 Malignant neoplasm of unspecified part of unspecified bronchus or lung: Secondary | ICD-10-CM

## 2014-09-20 DIAGNOSIS — Z510 Encounter for antineoplastic radiation therapy: Secondary | ICD-10-CM | POA: Diagnosis not present

## 2014-09-20 MED ORDER — MAGIC MOUTHWASH W/LIDOCAINE: 5.0000 mL | Freq: Four times a day (QID) | ORAL | Status: DC | PRN

## 2014-09-20 MED ORDER — FENTANYL 50 MCG/HR TD PT72: 50.0000 ug | MEDICATED_PATCH | TRANSDERMAL | Status: DC

## 2014-09-20 MED ORDER — AFATINIB DIMALEATE 40 MG PO TABS: 40.0000 mg | ORAL_TABLET | Freq: Every day | ORAL | Status: DC

## 2014-09-20 MED ORDER — MAGIC MOUTHWASH W/LIDOCAINE
5.0000 mL | Freq: Four times a day (QID) | ORAL | Status: DC | PRN
Start: 2014-09-20 — End: 2014-12-22

## 2014-09-20 NOTE — Telephone Encounter (Signed)
Refill of Gilotrif sent to [facility]. We can order Magic mouth wash for mouth burns. Please refill her Fentanyl patch.

## 2014-09-20 NOTE — Telephone Encounter (Signed)
VM message from patient requesting refill on her Gilotrif 40 mg tabs @ [facility],  her Fentanyl patches 50 mcg @ [facility] on [address].. Fentanyl was last filled on 08/11/14.  Patient also states that her mouth burns. She has tried biotene but this has not helped.

## 2014-09-20 NOTE — Progress Notes (Addendum)
Mahrukh Seguin has completed 8 fractions to her left subclavian.  She reports pain at a 4/10 in her left hip.  She is going to have an MRI on Thursday.  She continues to use a fentanly patch for pain.  She reports that her mouth is very sore.  On inspection, the roof of her mouth is red.  Magic Mouthwash has been called in for her by Dr. Worthy Flank office.  She continues to take Gilotrif.  She reports fatigue.  She denies a sore throat.  She has been taking amoxicillin for a sinus infection.  She has lost 2 lbs since last week.  Her skin is intact in the treatment area.  She is using biafine cream.  BP 95/69 mmHg  Pulse 94  Temp(Src) 97.9 F (36.6 C) (Oral)  Resp 16  Ht '5\' 4"'$  (1.626 m)  Wt 121 lb 14.4 oz (55.293 kg)  BMI 20.91 kg/m2  SpO2 100%  LMP 01/14/2008

## 2014-09-20 NOTE — Progress Notes (Signed)
  Radiation Oncology         [phone] ________________________________  Name: Sherri Stafford MRN: [id number]  Date: 09/20/2014  DOB: 01-Dec-1950  Weekly Radiation Therapy Management   DIAGNOSIS: stage IV (T2a, N3, M1b) non-small cell lung cancer   Current Dose: 20 Gy     Planned Dose:  35 Gy  Narrative . . . . . . . . The patient presents for routine under treatment assessment.                                 She reports pain at a 4/10 in her left hip. She is going to have an MRI on Thursday. She continues to use a fentanly patch for pain. She reports that her mouth is very sore. Magic Mouthwash has been called in for her by Dr. Worthy Flank office. She continues to take Gilotrif. She reports fatigue. She denies a sore throat. She has been taking amoxicillin for a sinus infection. She has lost 2 lbs since last week. Her skin is intact in the treatment area. She is using biafine cream.                                 Set-up films were reviewed.                                 The chart was checked. Physical Findings. . .  height is '5\' 4"'$  (1.626 m) and weight is 121 lb 14.4 oz (55.293 kg). Her oral temperature is 97.9 F (36.6 C). Her blood pressure is 95/69 and her pulse is 94. Her respiration is 16 and oxygen saturation is 100%. . Patient has significant mucositis along the buccal mucosa and soft palate region no hemorrhagic mucositis.  The left supraclavicular lymph node mass seems to be smaller on exam today. Impression . . . . . . . The patient is tolerating radiation. Plan . . . . . . . . . . . . Continue treatment as planned.  ________________________________   Blair Promise, PhD, MD

## 2014-09-21 ENCOUNTER — Ambulatory Visit
Admission: RE | Admit: 2014-09-21 | Discharge: 2014-09-21 | Disposition: A | Discharge status: Home / Self Care | Payer: BC Managed Care – PPO | Source: Ambulatory Visit | Attending: Radiation Oncology | Admitting: Radiation Oncology

## 2014-09-21 DIAGNOSIS — Z510 Encounter for antineoplastic radiation therapy: Secondary | ICD-10-CM | POA: Diagnosis not present

## 2014-09-22 ENCOUNTER — Ambulatory Visit
Admission: RE | Admit: 2014-09-22 | Discharge: 2014-09-22 | Disposition: A | Discharge status: Home / Self Care | Payer: BC Managed Care – PPO | Source: Ambulatory Visit | Attending: Radiation Oncology | Admitting: Radiation Oncology

## 2014-09-22 ENCOUNTER — Ambulatory Visit (HOSPITAL_COMMUNITY)
Admission: RE | Admit: 2014-09-22 | Discharge: 2014-09-22 | Disposition: A | Discharge status: Home / Self Care | Payer: BC Managed Care – PPO | Source: Ambulatory Visit | Attending: Physician Assistant | Admitting: Physician Assistant

## 2014-09-22 DIAGNOSIS — Z510 Encounter for antineoplastic radiation therapy: Secondary | ICD-10-CM | POA: Diagnosis not present

## 2014-09-22 DIAGNOSIS — C3490 Malignant neoplasm of unspecified part of unspecified bronchus or lung: Secondary | ICD-10-CM | POA: Insufficient documentation

## 2014-09-22 DIAGNOSIS — M25551 Pain in right hip: Secondary | ICD-10-CM | POA: Diagnosis present

## 2014-09-22 DIAGNOSIS — M25552 Pain in left hip: Secondary | ICD-10-CM | POA: Insufficient documentation

## 2014-09-22 DIAGNOSIS — C7951 Secondary malignant neoplasm of bone: Secondary | ICD-10-CM | POA: Insufficient documentation

## 2014-09-22 DIAGNOSIS — R1032 Left lower quadrant pain: Secondary | ICD-10-CM

## 2014-09-22 MED ORDER — GADOBENATE DIMEGLUMINE 529 MG/ML IV SOLN
10.0000 mL | Freq: Once | INTRAVENOUS | Status: AC | PRN
  Administered 2014-09-22: 10 mL via INTRAVENOUS

## 2014-09-22 MED ORDER — HEPARIN SOD (PORK) LOCK FLUSH 100 UNIT/ML IV SOLN
500.0000 [IU] | INTRAVENOUS | Status: AC | PRN
  Administered 2014-09-22: 500 [IU]

## 2014-09-23 ENCOUNTER — Ambulatory Visit
Admission: RE | Admit: 2014-09-23 | Discharge: 2014-09-23 | Disposition: A | Discharge status: Home / Self Care | Payer: BC Managed Care – PPO | Source: Ambulatory Visit | Attending: Radiation Oncology | Admitting: Radiation Oncology

## 2014-09-23 DIAGNOSIS — Z510 Encounter for antineoplastic radiation therapy: Secondary | ICD-10-CM | POA: Diagnosis not present

## 2014-09-26 ENCOUNTER — Ambulatory Visit: Payer: BC Managed Care – PPO

## 2014-09-26 ENCOUNTER — Ambulatory Visit (HOSPITAL_BASED_OUTPATIENT_CLINIC_OR_DEPARTMENT_OTHER): Payer: BC Managed Care – PPO

## 2014-09-26 ENCOUNTER — Ambulatory Visit (HOSPITAL_BASED_OUTPATIENT_CLINIC_OR_DEPARTMENT_OTHER): Payer: BC Managed Care – PPO | Admitting: Nurse Practitioner

## 2014-09-26 ENCOUNTER — Telehealth: Payer: Self-pay | Admitting: Nurse Practitioner

## 2014-09-26 ENCOUNTER — Other Ambulatory Visit: Payer: Self-pay | Admitting: *Deleted

## 2014-09-26 ENCOUNTER — Encounter: Payer: Self-pay | Admitting: Nurse Practitioner

## 2014-09-26 ENCOUNTER — Ambulatory Visit
Admission: RE | Admit: 2014-09-26 | Discharge: 2014-09-26 | Disposition: A | Discharge status: Home / Self Care | Payer: BC Managed Care – PPO | Source: Ambulatory Visit | Attending: Radiation Oncology | Admitting: Radiation Oncology

## 2014-09-26 ENCOUNTER — Telehealth: Payer: Self-pay | Admitting: *Deleted

## 2014-09-26 VITALS — BP 116/62 | HR 96 | Temp 98.0°F | Resp 16 | Wt 119.3 lb

## 2014-09-26 VITALS — BP 102/49 | HR 85

## 2014-09-26 DIAGNOSIS — R197 Diarrhea, unspecified: Secondary | ICD-10-CM

## 2014-09-26 DIAGNOSIS — C3432 Malignant neoplasm of lower lobe, left bronchus or lung: Secondary | ICD-10-CM

## 2014-09-26 DIAGNOSIS — E860 Dehydration: Secondary | ICD-10-CM | POA: Diagnosis not present

## 2014-09-26 DIAGNOSIS — C7951 Secondary malignant neoplasm of bone: Secondary | ICD-10-CM | POA: Diagnosis not present

## 2014-09-26 DIAGNOSIS — Z79899 Other long term (current) drug therapy: Secondary | ICD-10-CM | POA: Diagnosis not present

## 2014-09-26 DIAGNOSIS — Z95828 Presence of other vascular implants and grafts: Secondary | ICD-10-CM

## 2014-09-26 DIAGNOSIS — G893 Neoplasm related pain (acute) (chronic): Secondary | ICD-10-CM | POA: Diagnosis not present

## 2014-09-26 DIAGNOSIS — K1231 Oral mucositis (ulcerative) due to antineoplastic therapy: Secondary | ICD-10-CM | POA: Diagnosis not present

## 2014-09-26 DIAGNOSIS — Z510 Encounter for antineoplastic radiation therapy: Secondary | ICD-10-CM | POA: Diagnosis not present

## 2014-09-26 DIAGNOSIS — R21 Rash and other nonspecific skin eruption: Secondary | ICD-10-CM

## 2014-09-26 DIAGNOSIS — C3490 Malignant neoplasm of unspecified part of unspecified bronchus or lung: Secondary | ICD-10-CM

## 2014-09-26 LAB — CBC WITH DIFFERENTIAL/PLATELET
BASO%: 0.4 % (ref 0.0–2.0)
Basophils Absolute: 0 10*3/uL (ref 0.0–0.1)
EOS ABS: 0.6 10*3/uL — AB (ref 0.0–0.5)
EOS%: 8.9 % — AB (ref 0.0–7.0)
HCT: 34.1 % — ABNORMAL LOW (ref 34.8–46.6)
HEMOGLOBIN: 11.2 g/dL — AB (ref 11.6–15.9)
LYMPH%: 7.1 % — AB (ref 14.0–49.7)
MCH: 26.9 pg (ref 25.1–34.0)
MCHC: 32.9 g/dL (ref 31.5–36.0)
MCV: 81.6 fL (ref 79.5–101.0)
MONO#: 0.5 10*3/uL (ref 0.1–0.9)
MONO%: 8.3 % (ref 0.0–14.0)
NEUT%: 75.3 % (ref 38.4–76.8)
NEUTROS ABS: 5 10*3/uL (ref 1.5–6.5)
PLATELETS: 216 10*3/uL (ref 145–400)
RBC: 4.17 10*6/uL (ref 3.70–5.45)
RDW: 16.1 % — ABNORMAL HIGH (ref 11.2–14.5)
WBC: 6.6 10*3/uL (ref 3.9–10.3)
lymph#: 0.5 10*3/uL — ABNORMAL LOW (ref 0.9–3.3)

## 2014-09-26 LAB — COMPREHENSIVE METABOLIC PANEL (CC13)
ALBUMIN: 3.5 g/dL (ref 3.5–5.0)
ALT: 21 U/L (ref 0–55)
AST: 21 U/L (ref 5–34)
Alkaline Phosphatase: 92 U/L (ref 40–150)
Anion Gap: 9 mEq/L (ref 3–11)
BUN: 12.3 mg/dL (ref 7.0–26.0)
CALCIUM: 8.9 mg/dL (ref 8.4–10.4)
CHLORIDE: 106 meq/L (ref 98–109)
CO2: 22 mEq/L (ref 22–29)
Creatinine: 1 mg/dL (ref 0.6–1.1)
EGFR: 63 mL/min/{1.73_m2} — AB (ref >90)
GLUCOSE: 123 mg/dL (ref 70–140)
POTASSIUM: 3.9 meq/L (ref 3.5–5.1)
SODIUM: 137 meq/L (ref 136–145)
TOTAL PROTEIN: 7 g/dL (ref 6.4–8.3)
Total Bilirubin: 1.33 mg/dL — ABNORMAL HIGH (ref 0.20–1.20)

## 2014-09-26 LAB — TSH CHCC: TSH: 3.048 m(IU)/L (ref 0.308–3.960)

## 2014-09-26 MED ORDER — DIPHENOXYLATE-ATROPINE 2.5-0.025 MG PO TABS: 2.0000 | ORAL_TABLET | Freq: Four times a day (QID) | ORAL | Status: AC | PRN

## 2014-09-26 MED ORDER — SODIUM CHLORIDE 0.9 % IV SOLN
Freq: Once | INTRAVENOUS | Status: AC
  Administered 2014-09-26: 13:00:00 via INTRAVENOUS

## 2014-09-26 MED ORDER — AFATINIB DIMALEATE 30 MG PO TABS: 30.0000 mg | ORAL_TABLET | Freq: Every day | ORAL | Status: DC

## 2014-09-26 MED ORDER — HEPARIN SOD (PORK) LOCK FLUSH 100 UNIT/ML IV SOLN
500.0000 [IU] | Freq: Once | INTRAVENOUS | Status: DC
  Filled 2014-09-26: qty 5

## 2014-09-26 MED ORDER — SODIUM CHLORIDE 0.9 % IJ SOLN
10.0000 mL | INTRAMUSCULAR | Status: DC | PRN
  Administered 2014-09-26: 10 mL via INTRAVENOUS
  Filled 2014-09-26: qty 10

## 2014-09-26 MED ORDER — HEPARIN SOD (PORK) LOCK FLUSH 100 UNIT/ML IV SOLN
500.0000 [IU] | Freq: Once | INTRAVENOUS | Status: AC
  Administered 2014-09-26: 500 [IU] via INTRAVENOUS
  Filled 2014-09-26: qty 5

## 2014-09-26 NOTE — Assessment & Plan Note (Signed)
Patient has developed some significant diarrhea within the past few days as well.  She states she had 7-8 diarrhea episodes within the past 24 hours.  She has been taking the maximum Imodium per day, with only minimal effectiveness.  Ordered a stool for C. difficile to rule out.  Patient will receive IV fluid rehydration today.  Also, patient was given a prescription for Lomotil to alternate with the Imodium.  Patient was advised to hold the Gilotrif for one week to allow for recovery.

## 2014-09-26 NOTE — Patient Instructions (Signed)

## 2014-09-26 NOTE — Assessment & Plan Note (Signed)
Bilirubin has increased from 0.85-1.33.  Will continue to monitor closely.

## 2014-09-26 NOTE — Assessment & Plan Note (Signed)
Patient has been complaining of some chronic, increased left hip pain.  MRI of the left hip/pelvic region obtained on 09/22/2014 revealed:  Multiple metastatic bone lesions involving the pelvis as described above. The right iliac lesion likely accounts for the patient's right hip pain and the left acetabular lesions could be responsible for the patient's left hip pain.  There is evidence of cortical breakthrough involving the right iliac bone lesion. No other pathologic fractures are demonstrated.  No significant intrapelvic abnormalities.  Patient ready has pain medication at home.  Patient was advised to follow-up with radiation oncologist Dr. Sondra Come regarding MRI results.  Patient may be a candidate for additional radiation treatment to the left hip region for pain control.

## 2014-09-26 NOTE — Telephone Encounter (Signed)
Pt states she has been having diarrhea since Friday. 8 last night, has "maxed out" imodium. Mouth raw-using MMW, but not sure when to use it. Pt tearful, feels weak. Instructed to come in for an office visit with Selena Lesser, NP

## 2014-09-26 NOTE — Progress Notes (Signed)
Patient transported to treatment room at 1410 with IVF. Report given to [facility].

## 2014-09-26 NOTE — Assessment & Plan Note (Signed)
Patient developed significant diarrhea over this past weekend.  Patient states that she had approximately 7-8 diarrhea episodes within the past 24 hours.  We'll order a stool for C. difficile to rule out.  Patient was given a prescription for Lomotil to alternate with the Imodium.  Patient will receive IV fluid rehydration today; and will also return this coming Wednesday and Friday for additional IV fluid rehydration.  She was also encouraged to push fluids at home.

## 2014-09-26 NOTE — Assessment & Plan Note (Signed)
Patient has developed some significant mucositis within the past several days.  She states she is having a hard time taking anything in orally due to the discomfort.  She has started been prescribed Magic mouthwash with lidocaine; with only minimal effectiveness.  On exam.-Patient has increased erythema to her entire tongue; with multiple lesions to her entire oral mucosa.  Patient also has cracks to the corners of her mouth.  Patient was advised to continue with her Magic mouthwash with lidocaine as previously directed.  Patient will receive IV fluid rehydration today; and both this coming Wednesday and Friday as well.  Patient was advised to hold her Gilotrif oral chemotherapy for one week to allow for recovery.

## 2014-09-26 NOTE — Telephone Encounter (Signed)
Appointments made and avs pritned by [facility]

## 2014-09-26 NOTE — Assessment & Plan Note (Signed)
Patient initiated Gilotrif oral therapy on 08/29/2014 at 40 mg per day.  She also continues to receive radiation treatments to her left neck adenopathy.  Patient reports that she has a total of 2 radiation treatments left after today.  Patient developed significant diarrhea and mucositis over this past weekend.  Blood counts remained fairly stable.  Patient was advised to hold the Gilotrif for one week to allow for recovery of both her diarrhea and mucositis.  The plan is for the patient to return both Wednesday, 09/28/2014 and Friday, 09/30/2014 for IV fluid rehydration only.  Patient will return next Monday, 10/03/2014 for labs and follow up visit to see if she has recovered enough to restart her Gilotrif.   Advised patient the plan would be to decrease the Gilotrif down to 30 mg per day when she restarts the medication in hopes it will be better tolerated.

## 2014-09-26 NOTE — Progress Notes (Signed)
SYMPTOM MANAGEMENT CLINIC   HPI: Sherri Stafford 64 y.o. female diagnosed with lung cancer with bone metastasis.  Currently undergoing Gilotirf oral therapy; as well as radiation treatments.  Patient is complaining of recent onset severe diarrhea and mucositis.  She has little oral intake secondary to the mucositis discomfort.  She states she has been trying the Magic mouthwash with lidocaine with only minimal effectiveness.  Patient is also complaining of some chronic left hip pain; and recently underwent an MRI for further evaluation.  She is questioning the results of this MRI today.  Patient denies any other new symptoms whatsoever.  She denies any recent fevers or chills.   HPI  ROS  Past Medical History  Diagnosis Date  . Arthritis   . Bilateral ovarian cysts   . GERD (gastroesophageal reflux disease)   . Strain of hip flexor 06/2013    left side torn  . Radiation     at [facility] to left hip, sacrum and brain  . Radiation 02/10/14-03/06/14    left central chest 35 gray  . Cancer     lung ca  . Bone metastases     to left hip and spine  . Encounter for antineoplastic chemotherapy 09/19/2014    Past Surgical History  Procedure Laterality Date  . Dilation and curettage of uterus  2004    x 3   . Tubal ligation    . Colonoscopy  04/05/2004    normal     has Nonspecific abnormal results of liver function study; Cough; Sarcoidosis; Unspecified vitamin D deficiency; Dyspnea; Adenocarcinoma of lung; Chest pain; Pulmonary embolism; Acute bronchitis; Bone metastases; Abdominal pain; Nausea without vomiting; Long term current use of anticoagulant therapy; Dysuria; Dehydration; Hyperbilirubinemia; Hypoalbuminemia; Neoplasm related pain; Anemia in neoplastic disease; Itching; Constipation; Encounter for antineoplastic chemotherapy; Mucositis due to chemotherapy; and Diarrhea on her problem list.    is allergic to other; ketoconazole; lorazepam; morphine and related; nystatin;  sporanox; tylenol; vitamin d; and zofran.    Medication List       This list is accurate as of: 09/26/14  4:05 PM.  Always use your most recent med list.               afatinib dimaleate 30 MG tablet  Commonly known as:  GILOTRIF  Take 1 tablet (30 mg total) by mouth daily. Take on an empty stomach 1hr before or 2 hrs after meals.     albuterol 108 (90 BASE) MCG/ACT inhaler  Commonly known as:  PROVENTIL HFA;VENTOLIN HFA  Inhale 1-2 puffs into the lungs every 6 (six) hours as needed for wheezing or shortness of breath.     bisacodyl 10 MG suppository  Commonly known as:  DULCOLAX  Place 1 suppository (10 mg total) rectally daily as needed for moderate constipation.     calcium carbonate 1250 (500 CA) MG chewable tablet  Commonly known as:  OS-CAL  Chew 4 tablets by mouth daily.     clindamycin 1 % external solution  Commonly known as:  CLEOCIN-T  Apply topically 2 (two) times daily.     codeine 30 MG tablet  Take 1 tablet (30 mg total) by mouth every 4 (four) hours as needed (cough).     [facility] SENNA PLUS 8.6-50 MG per tablet  Generic drug:  senna-docusate     diphenhydrAMINE 25 MG tablet  Commonly known as:  SOMINEX  Take 25 mg by mouth at bedtime as needed for sleep. Takes 2 tablets every 3-4  hours during the night.     diphenoxylate-atropine 2.5-0.025 MG per tablet  Commonly known as:  LOMOTIL  Take 2 tablets by mouth 4 (four) times daily as needed for diarrhea or loose stools.     docusate sodium 100 MG capsule  Commonly known as:  COLACE  Take 100 mg by mouth 2 (two) times daily.     dronabinol 2.5 MG capsule  Commonly known as:  MARINOL  Take 1 capsule (2.5 mg total) by mouth 2 (two) times daily before a meal.     esomeprazole 40 MG capsule  Commonly known as:  NEXIUM  Take 1 capsule (40 mg total) by mouth 2 (two) times daily before a meal.     fentaNYL 50 MCG/HR  Commonly known as:  DURAGESIC - dosed mcg/hr  Place 1 patch (50 mcg total) onto the skin  every 3 (three) days.     glycerin adult 2 G Supp  Place 1 suppository rectally once as needed for moderate constipation.     magic mouthwash w/lidocaine Soln  Take 5 mLs by mouth 4 (four) times daily as needed for mouth pain.     naproxen sodium 220 MG tablet  Commonly known as:  ANAPROX  Take 220 mg by mouth 2 (two) times daily with a meal.     polyethylene glycol packet  Commonly known as:  MIRALAX / GLYCOLAX  Take 17 g by mouth daily as needed for moderate constipation.     prochlorperazine 10 MG tablet  Commonly known as:  COMPAZINE  Take 1 tablet (10 mg total) by mouth every 6 (six) hours as needed for nausea or vomiting.     rivaroxaban 20 MG Tabs tablet  Commonly known as:  XARELTO  Take 1 tablet (20 mg total) by mouth daily with supper.     traMADol 50 MG tablet  Commonly known as:  ULTRAM  TAKE 1 TABLET BY MOUTH EVERY 6 HOURS AS NEEDED         PHYSICAL EXAMINATION  Oncology Vitals 09/26/2014 09/26/2014 09/20/2014 09/14/2014 09/13/2014 09/01/2014 08/29/2014  Height - - 163 cm 163 cm 165 cm 165 cm 165 cm  Weight - 54.114 kg 55.293 kg 55.792 kg 55.929 kg 55.883 kg 57.607 kg  Weight (lbs) - 119 lbs 5 oz 121 lbs 14 oz 123 lbs 123 lbs 5 oz 123 lbs 3 oz 127 lbs  BMI (kg/m2) - - 20.92 kg/m2 21.11 kg/m2 20.68 kg/m2 20.66 kg/m2 21.3 kg/m2  Temp - 98 97.9 98 98.1 97.9 -  Pulse 85 96 94 95 88 94 92  Resp - '16 16 18 16 16 ' -  SpO2 - 100 100 100 99 100 -  BSA (m2) - - 1.58 m2 1.59 m2 1.6 m2 1.6 m2 1.62 m2   BP Readings from Last 3 Encounters:  09/26/14 102/49  09/26/14 116/62  09/20/14 95/69    Physical Exam  Constitutional: She is oriented to person, place, and time.  Patient appears fatigued, weak, frail, and chronically ill.  HENT:  Head: Normocephalic and atraumatic.  Tongue appears with increased erythema and sensitivity.  Patient also has multiple oral lesions to entire oral mucosa.  Patient has cracks to the corners of her lips.  Eyes: Conjunctivae and EOM are normal.  Pupils are equal, round, and reactive to light. Right eye exhibits no discharge. Left eye exhibits no discharge. No scleral icterus.  Neck: Normal range of motion.  Pulmonary/Chest: Effort normal. No stridor. No respiratory distress.  Musculoskeletal: Normal range of  motion. She exhibits tenderness. She exhibits no edema.  Mild left hip tenderness with movement and deep palpation only.  Neurological: She is alert and oriented to person, place, and time. Gait normal.  Skin: Skin is warm and dry. No rash noted. No erythema. There is pallor.  Psychiatric: Affect normal.  Nursing note and vitals reviewed.   LABORATORY DATA:. Appointment on 09/26/2014  Component Date Value Ref Range Status  . Sodium 09/26/2014 137  136 - 145 mEq/L Final  . Potassium 09/26/2014 3.9  3.5 - 5.1 mEq/L Final  . Chloride 09/26/2014 106  98 - 109 mEq/L Final  . CO2 09/26/2014 22  22 - 29 mEq/L Final  . Glucose 09/26/2014 123  70 - 140 mg/dl Final  . BUN 09/26/2014 12.3  7.0 - 26.0 mg/dL Final  . Creatinine 09/26/2014 1.0  0.6 - 1.1 mg/dL Final  . Total Bilirubin 09/26/2014 1.33* 0.20 - 1.20 mg/dL Final  . Alkaline Phosphatase 09/26/2014 92  40 - 150 U/L Final  . AST 09/26/2014 21  5 - 34 U/L Final  . ALT 09/26/2014 21  0 - 55 U/L Final  . Total Protein 09/26/2014 7.0  6.4 - 8.3 g/dL Final  . Albumin 09/26/2014 3.5  3.5 - 5.0 g/dL Final  . Calcium 09/26/2014 8.9  8.4 - 10.4 mg/dL Final  . Anion Gap 09/26/2014 9  3 - 11 mEq/L Final  . EGFR 09/26/2014 63* >90 ml/min/1.73 m2 Final   eGFR is calculated using the CKD-EPI Creatinine Equation (2009)  . WBC 09/26/2014 6.6  3.9 - 10.3 10e3/uL Final  . NEUT# 09/26/2014 5.0  1.5 - 6.5 10e3/uL Final  . HGB 09/26/2014 11.2* 11.6 - 15.9 g/dL Final  . HCT 09/26/2014 34.1* 34.8 - 46.6 % Final  . Platelets 09/26/2014 216  145 - 400 10e3/uL Final  . MCV 09/26/2014 81.6  79.5 - 101.0 fL Final  . MCH 09/26/2014 26.9  25.1 - 34.0 pg Final  . MCHC 09/26/2014 32.9  31.5 - 36.0  g/dL Final  . RBC 09/26/2014 4.17  3.70 - 5.45 10e6/uL Final  . RDW 09/26/2014 16.1* 11.2 - 14.5 % Final  . lymph# 09/26/2014 0.5* 0.9 - 3.3 10e3/uL Final  . MONO# 09/26/2014 0.5  0.1 - 0.9 10e3/uL Final  . Eosinophils Absolute 09/26/2014 0.6* 0.0 - 0.5 10e3/uL Final  . Basophils Absolute 09/26/2014 0.0  0.0 - 0.1 10e3/uL Final  . NEUT% 09/26/2014 75.3  38.4 - 76.8 % Final  . LYMPH% 09/26/2014 7.1* 14.0 - 49.7 % Final  . MONO% 09/26/2014 8.3  0.0 - 14.0 % Final  . EOS% 09/26/2014 8.9* 0.0 - 7.0 % Final  . BASO% 09/26/2014 0.4  0.0 - 2.0 % Final  . TSH 09/26/2014 3.048  0.308 - 3.960 m(IU)/L Final     RADIOGRAPHIC STUDIES: Mr Hip Left W Wo Contrast  09/23/2014   CLINICAL DATA:  Left groin and right hip pain for 6-8 months. No known trauma or injury. Patient has known metastatic non-small cell lung cancer.  EXAM: MRI OF THE LEFT HIP WITHOUT AND WITH CONTRAST  TECHNIQUE: Multiplanar, multisequence MR imaging was performed both before and after administration of intravenous contrast.  CONTRAST:  10 cc MultiHance  COMPARISON:  CT scan 08/22/2014  FINDINGS: There are multiple metastatic bone lesions involving the pelvis/ hips. There is a large, 5 cm lesion involving the right iliac bone with probable cortical breakthrough medially into the right SI joint. There is also some tumor outside the bone posteriorly and  there surrounding edema in the musculature.  There is also a moderate size lesion involving the mid sacrum without obvious pathologic fracture.  There is a lesion involving the left acetabulum which may account for the patient's left hip pain. No obvious pathologic fracture. A small lesion is also noted in the inferior pubic ramus on the left side. There is a small lesion in the left femoral head.  The pubic symphysis and SI joints are intact.  No significant intrapelvic abnormalities are demonstrated. No inguinal mass or hernia.  IMPRESSION: Multiple metastatic bone lesions involving the pelvis  as described above. The right iliac lesion likely accounts for the patient's right hip pain and the left acetabular lesions could be responsible for the patient's left hip pain.  There is evidence of cortical breakthrough involving the right iliac bone lesion. No other pathologic fractures are demonstrated.  No significant intrapelvic abnormalities.   Electronically Signed   By: Marijo Sanes M.D.   On: 09/23/2014 08:43    ASSESSMENT/PLAN:    Adenocarcinoma of lung Patient initiated Gilotrif oral therapy on 08/29/2014 at 40 mg per day.  She also continues to receive radiation treatments to her left neck adenopathy.  Patient reports that she has a total of 2 radiation treatments left after today.  Patient developed significant diarrhea and mucositis over this past weekend.  Blood counts remained fairly stable.  Patient was advised to hold the Gilotrif for one week to allow for recovery of both her diarrhea and mucositis.  The plan is for the patient to return both Wednesday, 09/28/2014 and Friday, 09/30/2014 for IV fluid rehydration only.  Patient will return next Monday, 10/03/2014 for labs and follow up visit to see if she has recovered enough to restart her Gilotrif.   Advised patient the plan would be to decrease the Gilotrif down to 30 mg per day when she restarts the medication in hopes it will be better tolerated.   Dehydration Patient developed significant diarrhea over this past weekend.  Patient states that she had approximately 7-8 diarrhea episodes within the past 24 hours.  We'll order a stool for C. difficile to rule out.  Patient was given a prescription for Lomotil to alternate with the Imodium.  Patient will receive IV fluid rehydration today; and will also return this coming Wednesday and Friday for additional IV fluid rehydration.  She was also encouraged to push fluids at home.  Hyperbilirubinemia Bilirubin has increased from 0.85-1.33.  Will continue to monitor  closely.  Neoplasm related pain Patient has been complaining of some chronic, increased left hip pain.  MRI of the left hip/pelvic region obtained on 09/22/2014 revealed:  Multiple metastatic bone lesions involving the pelvis as described above. The right iliac lesion likely accounts for the patient's right hip pain and the left acetabular lesions could be responsible for the patient's left hip pain.  There is evidence of cortical breakthrough involving the right iliac bone lesion. No other pathologic fractures are demonstrated.  No significant intrapelvic abnormalities.  Patient ready has pain medication at home.  Patient was advised to follow-up with radiation oncologist Dr. Sondra Come regarding MRI results.  Patient may be a candidate for additional radiation treatment to the left hip region for pain control.  Mucositis due to chemotherapy Patient has developed some significant mucositis within the past several days.  She states she is having a hard time taking anything in orally due to the discomfort.  She has started been prescribed Magic mouthwash with lidocaine; with only minimal  effectiveness.  On exam.-Patient has increased erythema to her entire tongue; with multiple lesions to her entire oral mucosa.  Patient also has cracks to the corners of her mouth.  Patient was advised to continue with her Magic mouthwash with lidocaine as previously directed.  Patient will receive IV fluid rehydration today; and both this coming Wednesday and Friday as well.  Patient was advised to hold her Gilotrif oral chemotherapy for one week to allow for recovery.  Diarrhea Patient has developed some significant diarrhea within the past few days as well.  She states she had 7-8 diarrhea episodes within the past 24 hours.  She has been taking the maximum Imodium per day, with only minimal effectiveness.  Ordered a stool for C. difficile to rule out.  Patient will receive IV fluid rehydration today.   Also, patient was given a prescription for Lomotil to alternate with the Imodium.  Patient was advised to hold the Gilotrif for one week to allow for recovery.  Patient stated understanding of all instructions; and was in agreement with this plan of care. The patient knows to call the clinic with any problems, questions or concerns.   This was a shared visit with Dr. Julien Nordmann today.   Total time spent with patient was 25 minutes;  with greater than 75 percent of that time spent in face to face counseling regarding patient's symptoms,  and coordination of care and follow up.  Disclaimer: This note was dictated with voice recognition software. Similar sounding words can inadvertently be transcribed and may not be corrected upon review.   Drue Second, NP 09/26/2014   ADDENDUM: Hematology/Oncology Attending:  I had a face to face encounter with the patient today. I recommended her care plan. This is a very pleasant 64 years old white female with metastatic non-small cell lung cancer, adenocarcinoma was currently undergoing treatment with Gilotrif 40 mg by mouth daily and has been tolerating her treatment fairly well except for mild skin rash but recently had several episodes of diarrhea for the last few days up to 7 times a day. The patient also complains of mucositis. I recommended for her to hold her treatment with Gilotrif for 1 week. I would consider resuming her treatment at a lower dose of Gilotrif 30 mg by mouth daily after improvement of her symptoms. We will also started the patient on Lomotil for the diarrhea in addition to Imodium. For the mucositis, will start the patient on Magic mouthwash. We will also arrange for the patient to receive IV hydration today. She will come back for follow-up visit in one week for reevaluation. She was advised to call immediately if she has any concerning symptoms in the interval.  Disclaimer: This note was dictated with voice recognition software.  Similar sounding words can inadvertently be transcribed and may be missed upon review. Eilleen Kempf., MD 09/26/2014

## 2014-09-27 ENCOUNTER — Ambulatory Visit: Payer: BC Managed Care – PPO

## 2014-09-27 ENCOUNTER — Other Ambulatory Visit: Payer: Self-pay | Admitting: Nurse Practitioner

## 2014-09-27 ENCOUNTER — Ambulatory Visit
Admission: RE | Admit: 2014-09-27 | Discharge: 2014-09-27 | Disposition: A | Discharge status: Home / Self Care | Payer: BC Managed Care – PPO | Source: Ambulatory Visit | Attending: Radiation Oncology | Admitting: Radiation Oncology

## 2014-09-27 ENCOUNTER — Telehealth: Payer: Self-pay | Admitting: Oncology

## 2014-09-27 ENCOUNTER — Encounter: Payer: Self-pay | Admitting: Radiation Oncology

## 2014-09-27 VITALS — BP 113/73 | HR 98 | Temp 97.8°F | Resp 20 | Ht 64.0 in | Wt 121.7 lb

## 2014-09-27 DIAGNOSIS — C3490 Malignant neoplasm of unspecified part of unspecified bronchus or lung: Secondary | ICD-10-CM

## 2014-09-27 DIAGNOSIS — E860 Dehydration: Secondary | ICD-10-CM

## 2014-09-27 DIAGNOSIS — Z510 Encounter for antineoplastic radiation therapy: Secondary | ICD-10-CM | POA: Diagnosis not present

## 2014-09-27 MED ORDER — SUCRALFATE 1 GM/10ML PO SUSP: 1.0000 g | Freq: Three times a day (TID) | ORAL | Status: AC

## 2014-09-27 NOTE — Progress Notes (Signed)
  Radiation Oncology         [phone]) [phone] ________________________________  Name: Sherri Stafford MRN: [id number]  Date: 09/27/2014  DOB: Feb 25, 1951  Weekly Radiation Therapy Management  DIAGNOSIS: stage IV (T2a, N3, M1b) non-small cell lung cancer  Current Dose: 32.5 Gy     Planned Dose:  35 Gy directed at the left supraclavicular fossa  Narrative . . . . . . . . The patient presents for routine under treatment assessment.                                   The patient to use have soreness in her mouth from her chemotherapy. She continues to use Magic mouthwash. Patient is also having bilateral hip pain. She did undergo an MRI of the pelvis which shows multiple osseous metastasis. The largest lesion was in the right iliac area which is likely explaining her pain along the right side. Patient was also noted to have a lesion along the left acetabular region.                                 Set-up films were reviewed.                                 The chart was checked. Physical Findings. . .  height is '5\' 4"'$  (1.626 m) and weight is 121 lb 11.2 oz (55.203 kg). Her oral temperature is 97.8 F (36.6 C). Her blood pressure is 113/73 and her pulse is 98. Her respiration is 20 and oxygen saturation is 100%. . The lungs are clear. The heart has a regular rhythm and rate. The abdomen is soft and nontender with normal bowel sounds. The left supraclavicular mass continues to decrease in size. erythema noted in this area without skin breakdown Impression . . . . . . . The patient is tolerating radiation. Plan . . . . . . . . . . . . Continue treatment as planned. The patient is symptomatic from her pelvic metastasis as above. She will proceed with planning for treatments directed to these areas in the near future. Patient has had prior treatment to the left pelvis area at [facility] and total dose to this area will be somewhat limited.  ________________________________   Blair Promise, PhD, MD

## 2014-09-27 NOTE — Telephone Encounter (Signed)
Called Sherri Stafford and let her know a prescription for carafate has been sent to [facility].  Fynn verbalized agreement.

## 2014-09-27 NOTE — Addendum Note (Signed)
Encounter addended by: Gery Pray, MD on: 09/27/2014  3:51 PM<BR>     Documentation filed: Orders

## 2014-09-27 NOTE — Progress Notes (Signed)
Sherri Stafford has completed 13 fractions to her left subclavian area.  She reports pain at a 6/10 in her mouth and also her left groin.  She is using a 50 mcg Fentanyl Patch.  She received fluids yesterday due to her mouth pain and not being able to swallow.  She reports pain "all the way down" with swallowing in her throat.  She is able to eat softer foods today.  She is using magic mouthwash 3 times a day.  She will received fluids on Wednesday and Friday as well.  Her mouth and tongue are red and she has a sore on the right inside of her cheek.  She is stopping Gilotrif for a week and will start it at 30 mg next week.  The skin on her left subclavian area is intact.  She uses biafine every once in a while.  She reports fatigue and a poor energy level.  BP 113/73 mmHg  Pulse 98  Temp(Src) 97.8 F (36.6 C) (Oral)  Resp 20  Ht '5\' 4"'$  (1.626 m)  Wt 121 lb 11.2 oz (55.203 kg)  BMI 20.88 kg/m2  SpO2 100%  LMP 01/14/2008

## 2014-09-28 ENCOUNTER — Ambulatory Visit (HOSPITAL_BASED_OUTPATIENT_CLINIC_OR_DEPARTMENT_OTHER): Payer: BC Managed Care – PPO

## 2014-09-28 ENCOUNTER — Ambulatory Visit
Admission: RE | Admit: 2014-09-28 | Discharge: 2014-09-28 | Disposition: A | Discharge status: Home / Self Care | Payer: BC Managed Care – PPO | Source: Ambulatory Visit | Attending: Radiation Oncology | Admitting: Radiation Oncology

## 2014-09-28 ENCOUNTER — Ambulatory Visit: Payer: BC Managed Care – PPO

## 2014-09-28 ENCOUNTER — Encounter: Payer: Self-pay | Admitting: Radiation Oncology

## 2014-09-28 VITALS — BP 107/68 | HR 104 | Temp 98.1°F | Resp 17

## 2014-09-28 DIAGNOSIS — C3432 Malignant neoplasm of lower lobe, left bronchus or lung: Secondary | ICD-10-CM

## 2014-09-28 DIAGNOSIS — C7951 Secondary malignant neoplasm of bone: Secondary | ICD-10-CM

## 2014-09-28 DIAGNOSIS — C7931 Secondary malignant neoplasm of brain: Secondary | ICD-10-CM

## 2014-09-28 DIAGNOSIS — E860 Dehydration: Secondary | ICD-10-CM

## 2014-09-28 DIAGNOSIS — Z510 Encounter for antineoplastic radiation therapy: Secondary | ICD-10-CM | POA: Diagnosis not present

## 2014-09-28 MED ORDER — HEPARIN SOD (PORK) LOCK FLUSH 100 UNIT/ML IV SOLN
500.0000 [IU] | INTRAVENOUS | Status: AC | PRN
  Administered 2014-09-28: 500 [IU]
  Filled 2014-09-28: qty 5

## 2014-09-28 MED ORDER — HEPARIN SOD (PORK) LOCK FLUSH 100 UNIT/ML IV SOLN
250.0000 [IU] | INTRAVENOUS | Status: DC | PRN
  Filled 2014-09-28: qty 5

## 2014-09-28 MED ORDER — SODIUM CHLORIDE 0.9 % IJ SOLN
10.0000 mL | INTRAMUSCULAR | Status: AC | PRN
  Administered 2014-09-28: 10 mL
  Filled 2014-09-28: qty 10

## 2014-09-28 MED ORDER — SODIUM CHLORIDE 0.9 % IV SOLN
INTRAVENOUS | Status: DC
  Administered 2014-09-28: 13:00:00 via INTRAVENOUS

## 2014-09-28 NOTE — Patient Instructions (Signed)
Oral Mucositis Oral mucositis is a mouth condition that may develop from treatment used to cure cancer. With this condition, sores may appear on your lips, gums, tongue,and the roof or floor of your mouth. CAUSES  Oral mucositis can happen to anyone who is being treated with cancer therapies, including:  Cancer drugs (chemotherapy).  Radiation (X-ray or other high-energy rays) for head or neck cancer.  Bone marrow transplants and stem cell transplants. Oralmucositis is not caused by infection. However, the sores can become infected after they form. Infection can make oral mucositis worse. The following factors increase your risk of oral mucositis:  Poor oral hygiene.  Dental problems or oral diseases.  Smoking.  Chewing tobacco.  Drinking alcohol.  Having other diseases such as diabetes, human immunodeficiency virus (HIV), acquired immunodeficiency syndrome (AIDS), or kidney disease.  Not drinking enough water.  Having dentures that do not fit right.  Being a child. Children are more likely than adults to develop oral mucositis, but children usually heal more quickly.  Being elderly. Elderly adults are more likely to develop oral mucositis. SYMPTOMS  Symptoms vary. They may be mild or severe. Symptoms usually show up 7 to 10 days after starting treatment. Symptoms may include:   Sores in the mouth that bleed.  Color changes inside the mouth. Red, shiny areas appear.  White patches or pus in the mouth.  Pain in the mouth and throat.  Pain when talking.  Dryness and a burning feeling in the mouth.  Saliva that is dry and thick.  Trouble eating, drinking, and swallowing.  Weight loss and malnutrition. This happens because eating is a problem. DIAGNOSIS  A caregiver will check your mouth. Then, the condition is given a grade. This grading system will help your caregiver treat your condition:  Grade 1: The inside of the mouth is sore and red.  Grade 2: There  is redness in the mouth. Open sores are present. Swallowing food might be uncomfortable.  Grade 3: There are open sores. The mouth is very red. It is very hard to swallow food.  Grade 4: No food or drink can be swallowed. TREATMENT  Oral mucositis usually heals on its own. Sometimes, changes in the cancer treatment can help. Keeping the mouth as clean and germ free as possible is very important.  Medicine may ease the condition. Different types of medicine may be needed, such as:  An antibiotic to fight infection, if present.  Medicine to help mucosal cells heal more quickly.  A water-based moisturizer for your lips, if they are affected.  Methods to control pain may include:  Keeping your mouth moist. You may suck on ice chips or sugar-free frozen ice pops.  Pain relievers that are swished around in the mouth. They will make the mouth numb to ease the pain (topical anesthetics).  Specific mouth rinses.  Prescribed, medicated gels. The gel coats the mouth. This protects nerve endings and lowers pain.  Narcotic pain medicines. These are strong drugs. They may be used if pain is very bad.  Mouth care can keep the mouth as healthy as possible and help to prevent infection. Mouth care includes:  A dental checkup. Your dental caregiver will make sure you have no teeth problems that could cause infection. Try to have the dental checkup before you begin your treatment for cancer.  Brushing your teeth several times a day. Use a soft toothbrush. Change to a new brush often. Use only gentle toothpastes. Ask your caregiver what product would   be best for you. Make sure that you also floss your teeth.  Rinsing your mouth after every meal. Rinse again at bedtime. Do not use mouthwash that contains alcohol. Ask your caregiver what would be best for you. HOME CARE INSTRUCTIONS  Only take over-the-counter or prescription medicines for pain, discomfort, or fever as directed by your caregiver.  Follow the directions carefully.  Do not smoke.  Do not drink alcohol.  Eat only bland, soft foods until your mouth sores heal. Avoid sugary and acidic foods and drinks.  Ask your caregiver if you should add high-protein shakes to your diet to avoid malnutrition and weight loss.  Drink enough fluids to keep your urine clear or pale yellow.  If you have dentures, take them out often.  Continue to check your mouth every day for any signs of oral mucositis.  Keep all follow-up appointments. SEEK MEDICAL CARE IF:  You notice redness, soreness, or dryness in your mouth.  You have mouth or throat pain that makes it hard to swallow or speak. SEEK IMMEDIATE MEDICAL CARE IF:  Your pain in your mouth or throat gets worse and does not improve with pain medicine.  You have a lot of bleeding in your mouth.  You develop new, open sores in your mouth.  You notice patches of pus forming in your mouth.  You cannot swallow solid food or liquids.  You have a fever. Document Released: 11/16/2010 Document Revised: 06/24/2011 Document Reviewed: 11/16/2010 Exit[employer] Patient Information 2015 [employer]. This information is not intended to replace advice given to you by your health care provider. Make sure you discuss any questions you have with your health care provider.  

## 2014-09-29 ENCOUNTER — Other Ambulatory Visit: Payer: BC Managed Care – PPO

## 2014-09-29 ENCOUNTER — Telehealth: Payer: Self-pay

## 2014-09-29 ENCOUNTER — Ambulatory Visit: Payer: BC Managed Care – PPO | Admitting: Physician Assistant

## 2014-09-29 ENCOUNTER — Ambulatory Visit: Payer: BC Managed Care – PPO

## 2014-09-29 NOTE — Telephone Encounter (Signed)
This was handled by Lanell Matar pharmacist

## 2014-09-29 NOTE — Telephone Encounter (Signed)
Sherri Stafford from [employer] called stating he faxed a dose exchange form for the pt's gilotrif. He gave an erroneous fax #. He was given the correct fax# and will refax the form to Dr [facility] nurse.

## 2014-09-30 ENCOUNTER — Ambulatory Visit (HOSPITAL_BASED_OUTPATIENT_CLINIC_OR_DEPARTMENT_OTHER): Payer: BC Managed Care – PPO

## 2014-09-30 DIAGNOSIS — E860 Dehydration: Secondary | ICD-10-CM

## 2014-09-30 DIAGNOSIS — C7931 Secondary malignant neoplasm of brain: Secondary | ICD-10-CM

## 2014-09-30 DIAGNOSIS — R197 Diarrhea, unspecified: Secondary | ICD-10-CM | POA: Diagnosis not present

## 2014-09-30 DIAGNOSIS — C3432 Malignant neoplasm of lower lobe, left bronchus or lung: Secondary | ICD-10-CM

## 2014-09-30 DIAGNOSIS — C7951 Secondary malignant neoplasm of bone: Secondary | ICD-10-CM

## 2014-09-30 DIAGNOSIS — C3490 Malignant neoplasm of unspecified part of unspecified bronchus or lung: Secondary | ICD-10-CM

## 2014-09-30 MED ORDER — SODIUM CHLORIDE 0.9 % IV SOLN
1000.0000 mL | Freq: Once | INTRAVENOUS | Status: AC
  Administered 2014-09-30: 1000 mL via INTRAVENOUS

## 2014-09-30 MED ORDER — SODIUM CHLORIDE 0.9 % IJ SOLN
10.0000 mL | INTRAMUSCULAR | Status: DC | PRN
  Administered 2014-09-30: 10 mL via INTRAVENOUS
  Filled 2014-09-30: qty 10

## 2014-09-30 MED ORDER — HEPARIN SOD (PORK) LOCK FLUSH 100 UNIT/ML IV SOLN
500.0000 [IU] | Freq: Once | INTRAVENOUS | Status: AC
  Administered 2014-09-30: 500 [IU] via INTRAVENOUS
  Filled 2014-09-30: qty 5

## 2014-09-30 NOTE — Patient Instructions (Signed)
Dehydration, Adult Dehydration is when you lose more fluids from the body than you take in. Vital organs like the kidneys, brain, and heart cannot function without a proper amount of fluids and salt. Any loss of fluids from the body can cause dehydration.  CAUSES   Vomiting.  Diarrhea.  Excessive sweating.  Excessive urine output.  Fever. SYMPTOMS  Mild dehydration  Thirst.  Dry lips.  Slightly dry mouth. Moderate dehydration  Very dry mouth.  Sunken eyes.  Skin does not bounce back quickly when lightly pinched and released.  Dark urine and decreased urine production.  Decreased tear production.  Headache. Severe dehydration  Very dry mouth.  Extreme thirst.  Rapid, weak pulse (more than 100 beats per minute at rest).  Cold hands and feet.  Not able to sweat in spite of heat and temperature.  Rapid breathing.  Blue lips.  Confusion and lethargy.  Difficulty being awakened.  Minimal urine production.  No tears. DIAGNOSIS  Your caregiver will diagnose dehydration based on your symptoms and your exam. Blood and urine tests will help confirm the diagnosis. The diagnostic evaluation should also identify the cause of dehydration. TREATMENT  Treatment of mild or moderate dehydration can often be done at home by increasing the amount of fluids that you drink. It is best to drink small amounts of fluid more often. Drinking too much at one time can make vomiting worse. Refer to the home care instructions below. Severe dehydration needs to be treated at the hospital where you will probably be given intravenous (IV) fluids that contain water and electrolytes. HOME CARE INSTRUCTIONS   Ask your caregiver about specific rehydration instructions.  Drink enough fluids to keep your urine clear or pale yellow.  Drink small amounts frequently if you have nausea and vomiting.  Eat as you normally do.  Avoid:  Foods or drinks high in sugar.  Carbonated  drinks.  Juice.  Extremely hot or cold fluids.  Drinks with caffeine.  Fatty, greasy foods.  Alcohol.  Tobacco.  Overeating.  Gelatin desserts.  Wash your hands well to avoid spreading bacteria and viruses.  Only take over-the-counter or prescription medicines for pain, discomfort, or fever as directed by your caregiver.  Ask your caregiver if you should continue all prescribed and over-the-counter medicines.  Keep all follow-up appointments with your caregiver. SEEK MEDICAL CARE IF:  You have abdominal pain and it increases or stays in one area (localizes).  You have a rash, stiff neck, or severe headache.  You are irritable, sleepy, or difficult to awaken.  You are weak, dizzy, or extremely thirsty. SEEK IMMEDIATE MEDICAL CARE IF:   You are unable to keep fluids down or you get worse despite treatment.  You have frequent episodes of vomiting or diarrhea.  You have blood or green matter (bile) in your vomit.  You have blood in your stool or your stool looks black and tarry.  You have not urinated in 6 to 8 hours, or you have only urinated a small amount of very dark urine.  You have a fever.  You faint. MAKE SURE YOU:   Understand these instructions.  Will watch your condition.  Will get help right away if you are not doing well or get worse. Document Released: 04/01/2005 Document Revised: 06/24/2011 Document Reviewed: 11/19/2010 ExitCare Patient Information 2015 [employer]. This information is not intended to replace advice given to you by your health care provider. Make sure you discuss any questions you have with your health care   provider.  

## 2014-10-03 ENCOUNTER — Ambulatory Visit (HOSPITAL_BASED_OUTPATIENT_CLINIC_OR_DEPARTMENT_OTHER): Payer: BC Managed Care – PPO | Admitting: Physician Assistant

## 2014-10-03 ENCOUNTER — Ambulatory Visit (HOSPITAL_BASED_OUTPATIENT_CLINIC_OR_DEPARTMENT_OTHER): Payer: BC Managed Care – PPO

## 2014-10-03 ENCOUNTER — Other Ambulatory Visit (HOSPITAL_BASED_OUTPATIENT_CLINIC_OR_DEPARTMENT_OTHER): Payer: BC Managed Care – PPO

## 2014-10-03 ENCOUNTER — Encounter: Payer: Self-pay | Admitting: Physician Assistant

## 2014-10-03 ENCOUNTER — Telehealth: Payer: Self-pay | Admitting: Internal Medicine

## 2014-10-03 VITALS — BP 109/74 | HR 104 | Temp 98.5°F | Resp 20 | Wt 118.5 lb

## 2014-10-03 DIAGNOSIS — C3432 Malignant neoplasm of lower lobe, left bronchus or lung: Secondary | ICD-10-CM

## 2014-10-03 DIAGNOSIS — C7951 Secondary malignant neoplasm of bone: Secondary | ICD-10-CM | POA: Diagnosis not present

## 2014-10-03 DIAGNOSIS — Z5111 Encounter for antineoplastic chemotherapy: Secondary | ICD-10-CM

## 2014-10-03 DIAGNOSIS — C3490 Malignant neoplasm of unspecified part of unspecified bronchus or lung: Secondary | ICD-10-CM

## 2014-10-03 DIAGNOSIS — Z95828 Presence of other vascular implants and grafts: Secondary | ICD-10-CM

## 2014-10-03 LAB — CBC WITH DIFFERENTIAL/PLATELET
BASO%: 0.3 % (ref 0.0–2.0)
Basophils Absolute: 0 10*3/uL (ref 0.0–0.1)
EOS%: 6.1 % (ref 0.0–7.0)
Eosinophils Absolute: 0.4 10*3/uL (ref 0.0–0.5)
HEMATOCRIT: 33.7 % — AB (ref 34.8–46.6)
HEMOGLOBIN: 10.7 g/dL — AB (ref 11.6–15.9)
LYMPH%: 6.9 % — AB (ref 14.0–49.7)
MCH: 26.2 pg (ref 25.1–34.0)
MCHC: 31.8 g/dL (ref 31.5–36.0)
MCV: 82.4 fL (ref 79.5–101.0)
MONO#: 0.5 10*3/uL (ref 0.1–0.9)
MONO%: 7.8 % (ref 0.0–14.0)
NEUT%: 78.9 % — ABNORMAL HIGH (ref 38.4–76.8)
NEUTROS ABS: 4.8 10*3/uL (ref 1.5–6.5)
PLATELETS: 214 10*3/uL (ref 145–400)
RBC: 4.09 10*6/uL (ref 3.70–5.45)
RDW: 16.2 % — ABNORMAL HIGH (ref 11.2–14.5)
WBC: 6.1 10*3/uL (ref 3.9–10.3)
lymph#: 0.4 10*3/uL — ABNORMAL LOW (ref 0.9–3.3)

## 2014-10-03 LAB — COMPREHENSIVE METABOLIC PANEL (CC13)
ALK PHOS: 81 U/L (ref 40–150)
ALT: 14 U/L (ref 0–55)
AST: 15 U/L (ref 5–34)
Albumin: 3.4 g/dL — ABNORMAL LOW (ref 3.5–5.0)
Anion Gap: 7 mEq/L (ref 3–11)
BUN: 19.4 mg/dL (ref 7.0–26.0)
CHLORIDE: 104 meq/L (ref 98–109)
CO2: 28 meq/L (ref 22–29)
Calcium: 9.4 mg/dL (ref 8.4–10.4)
Creatinine: 0.8 mg/dL (ref 0.6–1.1)
EGFR: 86 mL/min/{1.73_m2} — ABNORMAL LOW (ref >90)
GLUCOSE: 107 mg/dL (ref 70–140)
POTASSIUM: 4.3 meq/L (ref 3.5–5.1)
Sodium: 138 mEq/L (ref 136–145)
TOTAL PROTEIN: 6.6 g/dL (ref 6.4–8.3)
Total Bilirubin: 0.87 mg/dL (ref 0.20–1.20)

## 2014-10-03 MED ORDER — HEPARIN SOD (PORK) LOCK FLUSH 100 UNIT/ML IV SOLN
500.0000 [IU] | Freq: Once | INTRAVENOUS | Status: AC
  Administered 2014-10-03: 500 [IU] via INTRAVENOUS
  Filled 2014-10-03: qty 5

## 2014-10-03 MED ORDER — SODIUM CHLORIDE 0.9 % IJ SOLN
10.0000 mL | INTRAMUSCULAR | Status: DC | PRN
  Administered 2014-10-03: 10 mL via INTRAVENOUS
  Filled 2014-10-03: qty 10

## 2014-10-03 NOTE — Patient Instructions (Signed)
Start taking Gilotrif to 30 mg by mouth daily Continue with your palliative radiation therapy as scheduled Follow-up in 2 weeks

## 2014-10-03 NOTE — Patient Instructions (Signed)

## 2014-10-03 NOTE — Telephone Encounter (Signed)
Gave and printed avs for pt.Marland KitchenMarland Kitchen

## 2014-10-03 NOTE — Progress Notes (Addendum)
[place] [phone]   [phone]  OFFICE PROGRESS NOTE  [name], MD [place] [address]  DIAGNOSIS: stage IV (T2a, N3, M1b) non-small cell lung cancer, adenocarcinoma presented with large left lower lobe lung mass in addition to bilateral pulmonary nodules and mediastinal and supraclavicular lymphadenopathy diagnosed in May of 2015.  Genomic Alterations Identified? ERBB3 amplification CDK4 amplification IDH2 R172S KRAS G12D MDM2 amplification RBM10 G472f*36 TERC amplification - equivocal? Additional Disease-relevant Genes with No Reportable Alterations Identified? RET ALK BRAF ERBB2 MET EGFR  PRIOR THERAPY:  1) status post palliative radiotherapy to the left [facility]  status post stereotactic radiotherapy to brain lesions [place] [facility] [place] [facility] 2) Systemic chemotherapy with carboplatin for AUC of 5, Alimta 500 mg/M2 and Avastin 15 mg/KG every 3 weeks, status post 5 cycles. [facility] 3)  immunotherapy with Nivolmab at 3 mg/kg given every 3 weeks. Status post 16 cycles discontinued today secondary to disease progression.  CURRENT THERAPY:  1) Gilotrif 40 mg by mouth daily for a patient with positive ERBB3 amplification. Therapy starting 08/29/2014. Status post approximately 1 month of therapy 2) Xgeva 120 mcg subcutaneously every 2 months.  INTERVAL HISTORY: Sherri CYPHERS89y.o. female returns to the clinic today for follow-up visit accompanied by her boyfriend. The patient is feeling well today except for continued left hip/groin pain. She is currently on fentanyl patch and tramadol for pain. The MRI of her left hip performed on 09/22/2014 revealed multiple metastatic bone lesions  involving the pelvis. There is a right iliac lesion likely accounting for the patient's right hip pain in the left acetabular lesions could possibly be responsible for the patient's left hip pain. There is evidence of cortical breakthrough involving the right iliac bone lesion. No other pathologic fractures are demonstrated. She is scheduled to see Dr. KSondra Cometomorrow for simulation prior to starting palliative radiotherapy for her hip pain. She started treatment with Gilotrif 40 mg by mouth daily on 08/29/2014, the dose of Gilotrif was recently decreased to 30 mg daily because of excessive diarrhea. She has received but as yet not started the lower dose.she took Imodium for the diarrhea which led to some constipation.  Her bowels have normalized now. The skin rash on her nose and face has improved significantly. She had dryness and soreness in her mouth making it very difficult to swallow. She required IV fluids last week. Her mouth has improved and she is able to eat and drink now. She has no fever or chills, no nausea or vomiting. She denied having any significant chest pain, shortness of breath, cough or hemoptysis.  MEDICAL HISTORY: Past Medical History  Diagnosis Date  . Arthritis   . Bilateral ovarian cysts   . GERD (gastroesophageal reflux disease)   . Strain of hip flexor 06/2013    left side torn  . Radiation     at [facility] to left hip, sacrum and brain  . Radiation 02/10/14-03/06/14    left central chest 35 gray  . Cancer     lung ca  . Bone metastases     to left hip and spine  . Encounter for antineoplastic chemotherapy 09/19/2014    ALLERGIES:  is allergic to other; ketoconazole; lorazepam; morphine and related;  nystatin; sporanox; tylenol; vitamin d; and zofran.  MEDICATIONS:  Current Outpatient Prescriptions  Medication Sig Dispense Refill  . afatinib dimaleate (GILOTRIF) 30 MG tablet Take 1 tablet (30 mg total) by mouth daily. Take on an empty stomach 1hr before or 2 hrs after  meals. 30 tablet 1  . albuterol (PROVENTIL HFA;VENTOLIN HFA) 108 (90 BASE) MCG/ACT inhaler Inhale 1-2 puffs into the lungs every 6 (six) hours as needed for wheezing or shortness of breath. 1 Inhaler 2  . Alum & Mag Hydroxide-Simeth (MAGIC MOUTHWASH W/LIDOCAINE) SOLN Take 5 mLs by mouth 4 (four) times daily as needed for mouth pain. 240 mL 1  . bisacodyl (DULCOLAX) 10 MG suppository Place 1 suppository (10 mg total) rectally daily as needed for moderate constipation. 10 suppository 0  . calcium carbonate (OS-CAL) 1250 MG chewable tablet Chew 4 tablets by mouth daily.    . clindamycin (CLEOCIN-T) 1 % external solution Apply topically 2 (two) times daily. 30 mL 0  . codeine 30 MG tablet Take 1 tablet (30 mg total) by mouth every 4 (four) hours as needed (cough). 80 tablet 0  . [employer] SENNA PLUS 8.6-50 MG per tablet   11  . diphenhydrAMINE (SOMINEX) 25 MG tablet Take 25 mg by mouth at bedtime as needed for sleep. Takes 2 tablets every 3-4 hours during the night.    . diphenoxylate-atropine (LOMOTIL) 2.5-0.025 MG per tablet Take 2 tablets by mouth 4 (four) times daily as needed for diarrhea or loose stools. 30 tablet 0  . docusate sodium (COLACE) 100 MG capsule Take 100 mg by mouth 2 (two) times daily.    Marland Kitchen dronabinol (MARINOL) 2.5 MG capsule Take 1 capsule (2.5 mg total) by mouth 2 (two) times daily before a meal. 60 capsule 0  . esomeprazole (NEXIUM) 40 MG capsule Take 1 capsule (40 mg total) by mouth 2 (two) times daily before a meal. 60 capsule 0  . fentaNYL (DURAGESIC - DOSED MCG/HR) 50 MCG/HR Place 1 patch (50 mcg total) onto the skin every 3 (three) days. 10 patch 0  . glycerin adult 2 G SUPP Place 1 suppository rectally once as needed for moderate constipation.    . naproxen sodium (ANAPROX) 220 MG tablet Take 220 mg by mouth 2 (two) times daily with a meal.    . polyethylene glycol (MIRALAX / GLYCOLAX) packet Take 17 g by mouth daily as needed for moderate constipation.    . prochlorperazine  (COMPAZINE) 10 MG tablet Take 1 tablet (10 mg total) by mouth every 6 (six) hours as needed for nausea or vomiting. 30 tablet 1  . rivaroxaban (XARELTO) 20 MG TABS tablet Take 1 tablet (20 mg total) by mouth daily with supper. 30 tablet 5  . sucralfate (CARAFATE) 1 GM/10ML suspension Take 10 mLs (1 g total) by mouth 4 (four) times daily -  with meals and at bedtime. 420 mL 0  . traMADol (ULTRAM) 50 MG tablet TAKE 1 TABLET BY MOUTH EVERY 6 HOURS AS NEEDED 60 tablet 0   No current facility-administered medications for this visit.   Facility-Administered Medications Ordered in Other Visits  Medication Dose Route Frequency Provider Last Rate Last Dose  . sodium chloride 0.9 % injection 10 mL  10 mL Intracatheter PRN Curt Bears, MD   10 mL at 04/07/14 1200    SURGICAL HISTORY:  Past Surgical History  Procedure Laterality Date  . Dilation and curettage of uterus  2004    x 3   . Tubal ligation    .  Colonoscopy  04/05/2004    normal     REVIEW OF SYSTEMS:  Constitutional: negative Eyes: negative Ears, nose, mouth, throat, and face: positive for sinus tenderness and congestion Respiratory: negative Cardiovascular: negative Gastrointestinal: negative Genitourinary:negative Integument/breast: positive for rash Hematologic/lymphatic: negative Musculoskeletal:positive for bone pain, neck pain and left groin/hip pain Neurological: negative Behavioral/Psych: negative Endocrine: negative Allergic/Immunologic: negative   PHYSICAL EXAMINATION: General appearance: alert, cooperative, fatigued and no distress Head: Normocephalic, without obvious abnormality, atraumatic Neck: no adenopathy, no JVD, supple, symmetrical, trachea midline and thyroid not enlarged, symmetric, no tenderness/mass/nodules Lymph nodes: Cervical, supraclavicular, and axillary nodes normal. Resp: clear to auscultation bilaterally Back: symmetric, no curvature. ROM normal. No CVA tenderness. Cardio: regular rate and  rhythm, S1, S2 normal, no murmur, click, rub or gallop GI: soft, non-tender; bowel sounds normal; no masses,  no organomegaly Extremities: extremities normal, atraumatic, no cyanosis or edema Neurologic: Alert and oriented X 3, normal strength and tone. Normal symmetric reflexes. Normal coordination and gait Skin: grade 1 acneform rash across the nose and cheeks with dry skin. No evidence of infection.   ECOG PERFORMANCE STATUS: 1 - Symptomatic but completely ambulatory  Blood pressure 109/74, pulse 104, temperature 98.5 F (36.9 C), temperature source Oral, resp. rate 20, weight 118 lb 8 oz (53.751 kg), last menstrual period 01/14/2008, SpO2 99 %.  LABORATORY DATA: Lab Results  Component Value Date   WBC 6.1 10/03/2014   HGB 10.7* 10/03/2014   HCT 33.7* 10/03/2014   MCV 82.4 10/03/2014   PLT 214 10/03/2014      Chemistry      Component Value Date/Time   NA 138 10/03/2014 1117   NA 137 07/28/2014 0916   K 4.3 10/03/2014 1117   K 4.4 07/28/2014 0916   CL 104 07/28/2014 0916   CO2 28 10/03/2014 1117   CO2 28 07/28/2014 0916   BUN 19.4 10/03/2014 1117   BUN 13 07/28/2014 0916   CREATININE 0.8 10/03/2014 1117   CREATININE 0.85 07/28/2014 0916   CREATININE 0.83 09/07/2013 1506      Component Value Date/Time   CALCIUM 9.4 10/03/2014 1117   CALCIUM 8.8 07/28/2014 0916   ALKPHOS 81 10/03/2014 1117   ALKPHOS 49 07/28/2014 0916   AST 15 10/03/2014 1117   AST 21 07/28/2014 0916   ALT 14 10/03/2014 1117   ALT 12 07/28/2014 0916   BILITOT 0.87 10/03/2014 1117   BILITOT 0.9 07/28/2014 0916       RADIOGRAPHIC STUDIES: Mr Hip Left W Wo Contrast  09/23/2014   CLINICAL DATA:  Left groin and right hip pain for 6-8 months. No known trauma or injury. Patient has known metastatic non-small cell lung cancer.  EXAM: MRI OF THE LEFT HIP WITHOUT AND WITH CONTRAST  TECHNIQUE: Multiplanar, multisequence MR imaging was performed both before and after administration of intravenous contrast.   CONTRAST:  10 cc MultiHance  COMPARISON:  CT scan 08/22/2014  FINDINGS: There are multiple metastatic bone lesions involving the pelvis/ hips. There is a large, 5 cm lesion involving the right iliac bone with probable cortical breakthrough medially into the right SI joint. There is also some tumor outside the bone posteriorly and there surrounding edema in the musculature.  There is also a moderate size lesion involving the mid sacrum without obvious pathologic fracture.  There is a lesion involving the left acetabulum which may account for the patient's left hip pain. No obvious pathologic fracture. A small lesion is also noted in the inferior pubic ramus on the  left side. There is a small lesion in the left femoral head.  The pubic symphysis and SI joints are intact.  No significant intrapelvic abnormalities are demonstrated. No inguinal mass or hernia.  IMPRESSION: Multiple metastatic bone lesions involving the pelvis as described above. The right iliac lesion likely accounts for the patient's right hip pain and the left acetabular lesions could be responsible for the patient's left hip pain.  There is evidence of cortical breakthrough involving the right iliac bone lesion. No other pathologic fractures are demonstrated.  No significant intrapelvic abnormalities.   Electronically Signed   By: Marijo Sanes M.D.   On: 09/23/2014 08:43   Mr Hip Left W Wo Contrast  09/23/2014   CLINICAL DATA:  Left groin and right hip pain for 6-8 months. No known trauma or injury. Patient has known metastatic non-small cell lung cancer.  EXAM: MRI OF THE LEFT HIP WITHOUT AND WITH CONTRAST  TECHNIQUE: Multiplanar, multisequence MR imaging was performed both before and after administration of intravenous contrast.  CONTRAST:  10 cc MultiHance  COMPARISON:  CT scan 08/22/2014  FINDINGS: There are multiple metastatic bone lesions involving the pelvis/ hips. There is a large, 5 cm lesion involving the right iliac bone with probable  cortical breakthrough medially into the right SI joint. There is also some tumor outside the bone posteriorly and there surrounding edema in the musculature.  There is also a moderate size lesion involving the mid sacrum without obvious pathologic fracture.  There is a lesion involving the left acetabulum which may account for the patient's left hip pain. No obvious pathologic fracture. A small lesion is also noted in the inferior pubic ramus on the left side. There is a small lesion in the left femoral head.  The pubic symphysis and SI joints are intact.  No significant intrapelvic abnormalities are demonstrated. No inguinal mass or hernia.  IMPRESSION: Multiple metastatic bone lesions involving the pelvis as described above. The right iliac lesion likely accounts for the patient's right hip pain and the left acetabular lesions could be responsible for the patient's left hip pain.  There is evidence of cortical breakthrough involving the right iliac bone lesion. No other pathologic fractures are demonstrated.  No significant intrapelvic abnormalities.   Electronically Signed   By: Marijo Sanes M.D.   On: 09/23/2014 08:43   ASSESSMENT AND PLAN: This is a very pleasant 64 years old white female with metastatic non-small cell lung cancer, adenocarcinoma status post induction chemotherapy with carboplatin and Alimta but unfortunately has evidence for disease progression. She is currently undergoing immunotherapy with Nivolumab status post 16 cycles and tolerating her treatment fairly well. The patient is doing very well with this treatment and she continues to have significant improvement in her symptoms except for the recent left neck and left hip pain. The recent CT scan of the neck, chest, abdomen and pelvis showed interval increase in bilateral supraclavicular adenopathy with a larger left supraclavicular lymph nodes the bilateral pulmonary nodules, mediastinal lymph nodes as well as a large left effusion are  unchanged. There was interval increase in the size of these pain neck metastasis in addition to increase and the periportal and periaortic metastatic lymphadenopathy. Her  treatment with immunotherapy with Nivolumab was discontinued secondary to disease progression. She is currently being treated with Gilotrif 40 mg by mouth daily status post approximately 4 weeks of therapy. She had increasing episodes of diarrhea and the 40 mg dose of Gilotrif was decreased to 30 mg. She received  30 mg Gilotrif but has not started it as yet. For the skin rash, she will continue clindamycin solution as needed. For the abdominal and back pain, she will continue on gabapentin and tramadol. For anticoagulation, she will continue on Xarelto. Patient was discussed with and also seen by Dr. Julien Nordmann. She was advised to begin Gilotrif 30 mg by mouth daily. She is to proceed with palliative radiation therapy in the care Dr. Sondra Come. The patient will follow-up in 2 weeks for reevaluation and management of any adverse effect of her treatment. She was advised to call immediately if she has any other concerning symptoms in the interval. The patient voices understanding of current disease status and treatment options and is in agreement with the current care plan.  All questions were answered. The patient knows to call the clinic with any problems, questions or concerns. We can certainly see the patient much sooner if necessary.  Carlton Adam, PA-C 10/03/2014   ADDENDUM: Hematology/Oncology Attending: I had a face to face encounter with the patient. I recommended her care plan. This is a very pleasant 64 years old white female with metastatic non-small cell lung cancer, adenocarcinoma who is currently undergoing treatment with Gilotrif 40 mg by mouth daily and tolerated the first few weeks fairly well except for several episodes of diarrhea as well as mucositis. Her dose of Gilotrif was reduced to 30 mg by mouth daily and  the patient is expected to start the first reduced dose.  The patient is also expected to start palliative radiotherapy to the left hip metastatic disease by Dr. Sondra Come in the next few days. For pain management, the patient will continue on treatment with gabapentin and tramadol. She would come back for follow-up visit in 2 weeks for reevaluation and management of any adverse effect of her treatment. She was advised to call immediately if she has any concerning symptoms in the interval.  Disclaimer: This note was dictated with voice recognition software. Similar sounding words can inadvertently be transcribed and may be missed upon review. Eilleen Kempf., MD 10/04/2014

## 2014-10-04 ENCOUNTER — Telehealth: Payer: Self-pay | Admitting: *Deleted

## 2014-10-04 ENCOUNTER — Other Ambulatory Visit: Payer: Self-pay | Admitting: *Deleted

## 2014-10-04 ENCOUNTER — Ambulatory Visit
Admission: RE | Admit: 2014-10-04 | Discharge: 2014-10-04 | Disposition: A | Discharge status: Home / Self Care | Payer: BC Managed Care – PPO | Source: Ambulatory Visit | Attending: Radiation Oncology | Admitting: Radiation Oncology

## 2014-10-04 DIAGNOSIS — C3490 Malignant neoplasm of unspecified part of unspecified bronchus or lung: Secondary | ICD-10-CM | POA: Diagnosis present

## 2014-10-04 DIAGNOSIS — Z87891 Personal history of nicotine dependence: Secondary | ICD-10-CM | POA: Insufficient documentation

## 2014-10-04 MED ORDER — AFATINIB DIMALEATE 30 MG PO TABS: 30.0000 mg | ORAL_TABLET | Freq: Every day | ORAL | Status: DC

## 2014-10-04 NOTE — Telephone Encounter (Signed)
She already has the prescription from [facility] but will be happy to send another prescription to [facility]

## 2014-10-04 NOTE — Telephone Encounter (Signed)
Curt Bears, MD at 10/04/2014 3:47 PM     Status: Signed       Expand All Collapse All   She already has the prescription from [facility] but will be happy to send another prescription to [facility]            Otila Kluver, RN at 10/04/2014 3:22 PM     Status: Signed       Expand All Collapse All   TC from [place]. The prescription for Gilotrif needs to be fax'd to them @ [phone]. It was previously re-fax'd from [place]. It needs to come directly from providers office.       Per MD, copy of pt's prescription reprinted and faxed to [facility] [phone]

## 2014-10-04 NOTE — Telephone Encounter (Signed)
TC from [place] [facility]. The prescription for Gilotrif needs to be fax'd to them @ [phone]. It was previously re-fax'd from [place]. It needs to come directly from providers office.

## 2014-10-05 ENCOUNTER — Telehealth: Payer: Self-pay | Admitting: *Deleted

## 2014-10-05 NOTE — Telephone Encounter (Signed)
VM message received @ 11:54 am. TC back to them. They are confirming receipt of prescription and are processing it now. No other issues identified.

## 2014-10-07 DIAGNOSIS — C3490 Malignant neoplasm of unspecified part of unspecified bronchus or lung: Secondary | ICD-10-CM | POA: Diagnosis not present

## 2014-10-10 NOTE — Progress Notes (Signed)
°  Radiation Oncology         [phone] ________________________________  Name: Sherri Stafford MRN: [id number]  Date: 09/28/2014  DOB: July 30, 1950  End of Treatment Note    ICD-9-CM ICD-10-CM   1. Adenocarcinoma of lung, left 162.9 C34.92     DIAGNOSIS: stage IV (T2a, N3, M1b) non-small cell lung cancer     Indication for treatment: Enlarging left supraclavicular mass and associated pain       Radiation treatment dates:   09/07/2014-09/28/2014  Site/dose:   35 gray in 14 fractions  Beams/energy:   3-D conformal using a AP and left posterior oblique beam, 10 x 6x beams  Narrative: The patient tolerated radiation treatment relatively well.   Her left supra clavicular mass decreased in size. She had improvement in her pain in this area.  Plan: The patient has completed radiation treatment. The patient will return to radiation oncology clinic for routine followup in one month. I advised them to call or return sooner if they have any questions or concerns related to their recovery or treatment.  -----------------------------------  Blair Promise, PhD, MD

## 2014-10-11 ENCOUNTER — Ambulatory Visit: Payer: BC Managed Care – PPO

## 2014-10-11 ENCOUNTER — Ambulatory Visit: Payer: BC Managed Care – PPO | Admitting: Radiation Oncology

## 2014-10-11 ENCOUNTER — Telehealth: Payer: Self-pay | Admitting: *Deleted

## 2014-10-11 ENCOUNTER — Telehealth: Payer: Self-pay | Admitting: Nurse Practitioner

## 2014-10-11 ENCOUNTER — Ambulatory Visit (HOSPITAL_BASED_OUTPATIENT_CLINIC_OR_DEPARTMENT_OTHER): Payer: BC Managed Care – PPO | Admitting: Nurse Practitioner

## 2014-10-11 ENCOUNTER — Other Ambulatory Visit (HOSPITAL_BASED_OUTPATIENT_CLINIC_OR_DEPARTMENT_OTHER): Payer: BC Managed Care – PPO

## 2014-10-11 VITALS — BP 117/77 | HR 111 | Temp 97.7°F | Resp 18 | Wt 115.1 lb

## 2014-10-11 DIAGNOSIS — C3490 Malignant neoplasm of unspecified part of unspecified bronchus or lung: Secondary | ICD-10-CM

## 2014-10-11 DIAGNOSIS — E860 Dehydration: Secondary | ICD-10-CM | POA: Diagnosis not present

## 2014-10-11 DIAGNOSIS — C3432 Malignant neoplasm of lower lobe, left bronchus or lung: Secondary | ICD-10-CM

## 2014-10-11 DIAGNOSIS — K1231 Oral mucositis (ulcerative) due to antineoplastic therapy: Secondary | ICD-10-CM

## 2014-10-11 DIAGNOSIS — R630 Anorexia: Secondary | ICD-10-CM

## 2014-10-11 DIAGNOSIS — C7931 Secondary malignant neoplasm of brain: Secondary | ICD-10-CM

## 2014-10-11 DIAGNOSIS — Z95828 Presence of other vascular implants and grafts: Secondary | ICD-10-CM

## 2014-10-11 DIAGNOSIS — R197 Diarrhea, unspecified: Secondary | ICD-10-CM

## 2014-10-11 DIAGNOSIS — G893 Neoplasm related pain (acute) (chronic): Secondary | ICD-10-CM

## 2014-10-11 DIAGNOSIS — C7951 Secondary malignant neoplasm of bone: Secondary | ICD-10-CM

## 2014-10-11 LAB — COMPREHENSIVE METABOLIC PANEL (CC13)
ALK PHOS: 119 U/L (ref 40–150)
ALT: 31 U/L (ref 0–55)
ANION GAP: 6 meq/L (ref 3–11)
AST: 28 U/L (ref 5–34)
Albumin: 3.5 g/dL (ref 3.5–5.0)
BUN: 20 mg/dL (ref 7.0–26.0)
CO2: 26 meq/L (ref 22–29)
Calcium: 8.8 mg/dL (ref 8.4–10.4)
Chloride: 103 mEq/L (ref 98–109)
Creatinine: 0.8 mg/dL (ref 0.6–1.1)
EGFR: 75 mL/min/{1.73_m2} — ABNORMAL LOW (ref >90)
Glucose: 113 mg/dl (ref 70–140)
Potassium: 3.9 mEq/L (ref 3.5–5.1)
SODIUM: 135 meq/L — AB (ref 136–145)
TOTAL PROTEIN: 6.9 g/dL (ref 6.4–8.3)
Total Bilirubin: 1.26 mg/dL — ABNORMAL HIGH (ref 0.20–1.20)

## 2014-10-11 LAB — MAGNESIUM (CC13): MAGNESIUM: 2.4 mg/dL (ref 1.5–2.5)

## 2014-10-11 LAB — CBC WITH DIFFERENTIAL/PLATELET
BASO%: 0.7 % (ref 0.0–2.0)
Basophils Absolute: 0 10*3/uL (ref 0.0–0.1)
EOS%: 4.3 % (ref 0.0–7.0)
Eosinophils Absolute: 0.3 10*3/uL (ref 0.0–0.5)
HEMATOCRIT: 34 % — AB (ref 34.8–46.6)
HGB: 11.1 g/dL — ABNORMAL LOW (ref 11.6–15.9)
LYMPH#: 0.4 10*3/uL — AB (ref 0.9–3.3)
LYMPH%: 6.5 % — ABNORMAL LOW (ref 14.0–49.7)
MCH: 26.4 pg (ref 25.1–34.0)
MCHC: 32.6 g/dL (ref 31.5–36.0)
MCV: 81.1 fL (ref 79.5–101.0)
MONO#: 0.4 10*3/uL (ref 0.1–0.9)
MONO%: 6.1 % (ref 0.0–14.0)
NEUT#: 5.3 10*3/uL (ref 1.5–6.5)
NEUT%: 82.4 % — AB (ref 38.4–76.8)
Platelets: 214 10*3/uL (ref 145–400)
RBC: 4.19 10*6/uL (ref 3.70–5.45)
RDW: 16.2 % — AB (ref 11.2–14.5)
WBC: 6.5 10*3/uL (ref 3.9–10.3)

## 2014-10-11 MED ORDER — SODIUM CHLORIDE 0.9 % IJ SOLN
10.0000 mL | INTRAMUSCULAR | Status: DC | PRN
  Administered 2014-10-11: 10 mL via INTRAVENOUS
  Filled 2014-10-11: qty 10

## 2014-10-11 MED ORDER — HEPARIN SOD (PORK) LOCK FLUSH 100 UNIT/ML IV SOLN
500.0000 [IU] | Freq: Once | INTRAVENOUS | Status: AC
  Administered 2014-10-11: 500 [IU] via INTRAVENOUS
  Filled 2014-10-11: qty 5

## 2014-10-11 MED ORDER — SODIUM CHLORIDE 0.9 % IV SOLN
INTRAVENOUS | Status: AC
  Administered 2014-10-11 (×2): via INTRAVENOUS

## 2014-10-11 NOTE — Telephone Encounter (Signed)
VM message received @ 9:08 am stating that pt was having mouth sores and diarrhea and dry heaves since starting Gilotrif on 09/26/14.  She had diarrhea x5 today.  Attempted to call back but only reached voice mail. Asked pt to call back ASAP.

## 2014-10-11 NOTE — Patient Instructions (Signed)

## 2014-10-11 NOTE — Telephone Encounter (Signed)
Labs/flush/ov added per 06/28 POF, pt is aware... KJ

## 2014-10-11 NOTE — Telephone Encounter (Signed)
Asked how may I help Sherri Stafford after call received reporting she left voicemail at 0900 and hasn't heard back yet.  "I re-started Gilotrif again last Monday.  I have mouth sores and started severe diarrhea and nausea.  Vomited this morning at 8:30 when I tried to take imodium.  Six bm's since midnight.  Mouth sores are white.  Did not pick up the lomotil because the diarrhea stopped when I stopped taking the gilotrif.  I'm trying to eat and drink but can't."  Return number is [phone].   Called patient back with instructions to come in at 12:15 for lab/12:30 flush, 1:00 [facility]

## 2014-10-12 ENCOUNTER — Ambulatory Visit: Payer: BC Managed Care – PPO

## 2014-10-12 ENCOUNTER — Ambulatory Visit
Admission: RE | Admit: 2014-10-12 | Discharge: 2014-10-12 | Disposition: A | Discharge status: Home / Self Care | Payer: BC Managed Care – PPO | Source: Ambulatory Visit | Attending: Radiation Oncology | Admitting: Radiation Oncology

## 2014-10-12 DIAGNOSIS — C3490 Malignant neoplasm of unspecified part of unspecified bronchus or lung: Secondary | ICD-10-CM | POA: Diagnosis not present

## 2014-10-13 ENCOUNTER — Ambulatory Visit
Admission: RE | Admit: 2014-10-13 | Discharge: 2014-10-13 | Disposition: A | Discharge status: Home / Self Care | Payer: BC Managed Care – PPO | Source: Ambulatory Visit | Attending: Radiation Oncology | Admitting: Radiation Oncology

## 2014-10-13 ENCOUNTER — Ambulatory Visit: Payer: BC Managed Care – PPO

## 2014-10-13 DIAGNOSIS — C3490 Malignant neoplasm of unspecified part of unspecified bronchus or lung: Secondary | ICD-10-CM | POA: Diagnosis not present

## 2014-10-14 ENCOUNTER — Ambulatory Visit (HOSPITAL_BASED_OUTPATIENT_CLINIC_OR_DEPARTMENT_OTHER): Payer: BC Managed Care – PPO

## 2014-10-14 ENCOUNTER — Ambulatory Visit: Payer: BC Managed Care – PPO

## 2014-10-14 ENCOUNTER — Ambulatory Visit (HOSPITAL_BASED_OUTPATIENT_CLINIC_OR_DEPARTMENT_OTHER): Payer: BC Managed Care – PPO | Admitting: Nurse Practitioner

## 2014-10-14 ENCOUNTER — Ambulatory Visit
Admission: RE | Admit: 2014-10-14 | Discharge: 2014-10-14 | Disposition: A | Discharge status: Home / Self Care | Payer: BC Managed Care – PPO | Source: Ambulatory Visit | Attending: Radiation Oncology | Admitting: Radiation Oncology

## 2014-10-14 ENCOUNTER — Other Ambulatory Visit: Payer: Self-pay | Admitting: *Deleted

## 2014-10-14 VITALS — BP 102/61 | HR 90 | Temp 97.9°F

## 2014-10-14 DIAGNOSIS — R630 Anorexia: Secondary | ICD-10-CM

## 2014-10-14 DIAGNOSIS — C3432 Malignant neoplasm of lower lobe, left bronchus or lung: Secondary | ICD-10-CM

## 2014-10-14 DIAGNOSIS — C3490 Malignant neoplasm of unspecified part of unspecified bronchus or lung: Secondary | ICD-10-CM | POA: Diagnosis not present

## 2014-10-14 DIAGNOSIS — E860 Dehydration: Secondary | ICD-10-CM | POA: Diagnosis not present

## 2014-10-14 DIAGNOSIS — K1231 Oral mucositis (ulcerative) due to antineoplastic therapy: Secondary | ICD-10-CM

## 2014-10-14 DIAGNOSIS — C7951 Secondary malignant neoplasm of bone: Secondary | ICD-10-CM

## 2014-10-14 DIAGNOSIS — G893 Neoplasm related pain (acute) (chronic): Secondary | ICD-10-CM

## 2014-10-14 DIAGNOSIS — C7931 Secondary malignant neoplasm of brain: Secondary | ICD-10-CM

## 2014-10-14 DIAGNOSIS — R197 Diarrhea, unspecified: Secondary | ICD-10-CM

## 2014-10-14 DIAGNOSIS — R110 Nausea: Secondary | ICD-10-CM

## 2014-10-14 MED ORDER — SODIUM CHLORIDE 0.9 % IV SOLN: Freq: Once | INTRAVENOUS | Status: DC

## 2014-10-14 MED ORDER — SODIUM CHLORIDE 0.9 % IJ SOLN
10.0000 mL | INTRAMUSCULAR | Status: DC | PRN
  Administered 2014-10-14: 10 mL via INTRAVENOUS
  Filled 2014-10-14: qty 10

## 2014-10-14 MED ORDER — HEPARIN SOD (PORK) LOCK FLUSH 100 UNIT/ML IV SOLN
500.0000 [IU] | Freq: Once | INTRAVENOUS | Status: AC
Start: 2014-10-14 — End: 2014-10-14
  Administered 2014-10-14: 500 [IU] via INTRAVENOUS
  Filled 2014-10-14: qty 5

## 2014-10-14 MED ORDER — SODIUM CHLORIDE 0.9 % IV SOLN
INTRAVENOUS | Status: DC
  Administered 2014-10-14: 11:00:00 via INTRAVENOUS

## 2014-10-14 MED ORDER — PROCHLORPERAZINE MALEATE 10 MG PO TABS: 10.0000 mg | ORAL_TABLET | Freq: Four times a day (QID) | ORAL | Status: AC | PRN

## 2014-10-14 NOTE — Patient Instructions (Signed)

## 2014-10-15 ENCOUNTER — Encounter: Payer: Self-pay | Admitting: Nurse Practitioner

## 2014-10-15 ENCOUNTER — Other Ambulatory Visit: Payer: Self-pay | Admitting: Nurse Practitioner

## 2014-10-15 DIAGNOSIS — R630 Anorexia: Secondary | ICD-10-CM | POA: Insufficient documentation

## 2014-10-15 DIAGNOSIS — E860 Dehydration: Secondary | ICD-10-CM

## 2014-10-15 NOTE — Assessment & Plan Note (Signed)
Since patient has decreased the Gilotrif to every other day-patient's mucositis has greatly improved.

## 2014-10-15 NOTE — Assessment & Plan Note (Signed)
Patient complaining of minimal appetite secondary to diarrhea and mucositis.  Patient appears dehydrated today.  Patient was encouraged to eat multiple small a day; and to push fluids as much as possible. Patient will receive IV fluid rehydration today while cancer Center.

## 2014-10-15 NOTE — Assessment & Plan Note (Signed)
Patient has been complaining of some chronic, increased left hip pain.  MRI of the left hip/pelvic region obtained on 09/22/2014 revealed:  Multiple metastatic bone lesions involving the pelvis as described above. The right iliac lesion likely accounts for the patient's right hip pain and the left acetabular lesions could be responsible for the patient's left hip pain.  There is evidence of cortical breakthrough involving the right iliac bone lesion. No other pathologic fractures are demonstrated.  No significant intrapelvic abnormalities.  ____ Pt currently undergoing palliative  radiation therapy to hip area for pain control.    She continues to take the pain medication she already has at home; and denies need for refill at this time.

## 2014-10-15 NOTE — Assessment & Plan Note (Signed)
Bilirubin elevated with last lab draw up to 1.26.  Will obtain repeat labs next week to recheck bilirubin.  Most likely, bilirubin elevation secondary to oral chemotherapy.

## 2014-10-15 NOTE — Assessment & Plan Note (Signed)
Patient complaining of minimal appetite secondary to diarrhea and mucositis.  Patient was dehydrated today.  Patient was encouraged to eat multiple small a day; and to push fluids is much as possible.

## 2014-10-15 NOTE — Assessment & Plan Note (Signed)
Patient complaining of returning mucositis since reinitiating the Gilotrif.  Advised pt to try taking the Gilotrif on an every other day basis instead; to see if that helps.

## 2014-10-15 NOTE — Assessment & Plan Note (Signed)
Patient initiated Gilotrif oral therapy on 08/29/2014 at 40 mg per day.  She decreased the dose of the Gilotrif on 10/03/14 due to poor tolerability.  She continued to complain of significant diarrhea and mucositis; and was advised to decrease the Gilotrif to every other day.   Since decreasing the Gilotrif to every other day- her diarrhea has essentially resolved.  She reports that she DID NOT give/obtain a stool sample to rule out C diff; since her diarrhea resolved.

## 2014-10-15 NOTE — Progress Notes (Signed)
SYMPTOM MANAGEMENT CLINIC   HPI: Sherri Stafford 64 y.o. 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  Currently undergoing oral Gilotrif chemotherapy and palliative radiation therapy to her hips.  Patient returns to the [facility] today with continued complaint of dehydration.  She is happy to report that her diarrhea has essentially resolved since decreasing the oral Gilotrif to every other day her mucositis issues have greatly improved as well.  She denies any new symptoms whatsoever.  She denies any recent fevers or chills.   Diarrhea     Review of Systems  Gastrointestinal: Positive for diarrhea.    Past Medical History  Diagnosis Date  . Arthritis   . Bilateral ovarian cysts   . GERD (gastroesophageal reflux disease)   . Strain of hip flexor 06/2013    left side torn  . Radiation     at [facility] to left hip, sacrum and brain  . Radiation 02/10/14-03/06/14    left central chest 35 gray  . Cancer     lung ca  . Bone metastases     to left hip and spine  . Encounter for antineoplastic chemotherapy 09/19/2014    Past Surgical History  Procedure Laterality Date  . Dilation and curettage of uterus  2004    x 3   . Tubal ligation    . Colonoscopy  04/05/2004    normal     has Nonspecific abnormal results of liver function study; Cough; Sarcoidosis; Unspecified vitamin D deficiency; Dyspnea; Adenocarcinoma of lung; Chest pain; Pulmonary embolism; Acute bronchitis; Bone metastases; Abdominal pain; Nausea without vomiting; Long term current use of anticoagulant therapy; Dysuria; Dehydration; Hyperbilirubinemia; Hypoalbuminemia; Neoplasm related pain; Anemia in neoplastic disease; Itching; Constipation; Encounter for antineoplastic chemotherapy; Mucositis due to chemotherapy; Diarrhea; and Anorexia on her problem list.    is allergic to other; ketoconazole; lorazepam; morphine and related; nystatin; sporanox; tylenol; vitamin d; and  zofran.    Medication List       This list is accurate as of: 10/14/14 11:59 PM.  Always use your most recent med list.               afatinib dimaleate 30 MG tablet  Commonly known as:  GILOTRIF  Take 1 tablet (30 mg total) by mouth daily. Take on an empty stomach 1hr before or 2 hrs after meals.     albuterol 108 (90 BASE) MCG/ACT inhaler  Commonly known as:  PROVENTIL HFA;VENTOLIN HFA  Inhale 1-2 puffs into the lungs every 6 (six) hours as needed for wheezing or shortness of breath.     bisacodyl 10 MG suppository  Commonly known as:  DULCOLAX  Place 1 suppository (10 mg total) rectally daily as needed for moderate constipation.     calcium carbonate 1250 (500 CA) MG chewable tablet  Commonly known as:  OS-CAL  Chew 4 tablets by mouth daily.     clindamycin 1 % external solution  Commonly known as:  CLEOCIN-T  Apply topically 2 (two) times daily.     codeine 30 MG tablet  Take 1 tablet (30 mg total) by mouth every 4 (four) hours as needed (cough).     [employer] SENNA PLUS 8.6-50 MG per tablet  Generic drug:  senna-docusate     diphenhydrAMINE 25 MG tablet  Commonly known as:  SOMINEX  Take 25 mg by mouth at bedtime as needed for sleep. Takes 2 tablets every 3-4 hours during the night.  diphenoxylate-atropine 2.5-0.025 MG per tablet  Commonly known as:  LOMOTIL  Take 2 tablets by mouth 4 (four) times daily as needed for diarrhea or loose stools.     docusate sodium 100 MG capsule  Commonly known as:  COLACE  Take 100 mg by mouth 2 (two) times daily.     dronabinol 2.5 MG capsule  Commonly known as:  MARINOL  Take 1 capsule (2.5 mg total) by mouth 2 (two) times daily before a meal.     esomeprazole 40 MG capsule  Commonly known as:  NEXIUM  Take 1 capsule (40 mg total) by mouth 2 (two) times daily before a meal.     fentaNYL 50 MCG/HR  Commonly known as:  DURAGESIC - dosed mcg/hr  Place 1 patch (50 mcg total) onto the skin every 3 (three) days.     glycerin  adult 2 G Supp  Place 1 suppository rectally once as needed for moderate constipation.     magic mouthwash w/lidocaine Soln  Take 5 mLs by mouth 4 (four) times daily as needed for mouth pain.     naproxen sodium 220 MG tablet  Commonly known as:  ANAPROX  Take 220 mg by mouth 2 (two) times daily with a meal.     polyethylene glycol packet  Commonly known as:  MIRALAX / GLYCOLAX  Take 17 g by mouth daily as needed for moderate constipation.     prochlorperazine 10 MG tablet  Commonly known as:  COMPAZINE  Take 1 tablet (10 mg total) by mouth every 6 (six) hours as needed for nausea or vomiting.     rivaroxaban 20 MG Tabs tablet  Commonly known as:  XARELTO  Take 1 tablet (20 mg total) by mouth daily with supper.     sucralfate 1 GM/10ML suspension  Commonly known as:  CARAFATE  Take 10 mLs (1 g total) by mouth 4 (four) times daily -  with meals and at bedtime.     traMADol 50 MG tablet  Commonly known as:  ULTRAM  TAKE 1 TABLET BY MOUTH EVERY 6 HOURS AS NEEDED         PHYSICAL EXAMINATION  Oncology Vitals 10/14/2014 10/14/2014 10/11/2014 10/03/2014 09/30/2014 09/30/2014 09/28/2014  Height - - - - - - -  Weight - - 52.209 kg 53.751 kg - - -  Weight (lbs) - - 115 lbs 2 oz 118 lbs 8 oz - - -  BMI (kg/m2) - - - - - - -  Temp - 97.9 97.7 98.5 - 98.2 98.1  Pulse 90 112 111 104 - 96 104  Resp - - _0 SpO2 - 100 100 99 - 100 -  BSA (m2) - - - - - - -   BP Readings from Last 3 Encounters:  10/14/14 102/61  10/11/14 117/77  10/03/14 109/74    Physical Exam  Constitutional: She is oriented to person, place, and time.  Patient appears fatigued, weak, frail, and chronically ill.  HENT:  Head: Normocephalic and atraumatic.  Mild erythema to time and lower gum region; just very tender.  Eyes: Conjunctivae and EOM are normal. Pupils are equal, round, and reactive to light. Right eye exhibits no discharge. Left eye exhibits no discharge. No scleral icterus.  Neck: Normal  range of motion. Neck supple. No JVD present. No tracheal deviation present. No thyromegaly present.  Cardiovascular: Normal rate, regular rhythm, normal heart sounds and intact distal pulses.   Pulmonary/Chest: Effort normal and breath  sounds normal. No stridor. No respiratory distress. She has no wheezes. She has no rales. She exhibits no tenderness.  Abdominal: Soft. Bowel sounds are normal. She exhibits no distension and no mass. There is no tenderness. There is no rebound and no guarding.  Musculoskeletal: Normal range of motion. She exhibits no edema or tenderness.  Lymphadenopathy:    She has no cervical adenopathy.  Neurological: She is alert and oriented to person, place, and time.  Patient in wheelchair during exam.  Skin: Skin is warm and dry. No rash noted. No erythema. There is pallor.  Psychiatric: Affect normal.  Nursing note and vitals reviewed.   LABORATORY DATA:. No visits with results within 3 Day(s) from this visit. Latest known visit with results is:  Appointment on 10/11/2014  Component Date Value Ref Range Status  . WBC 10/11/2014 6.5  3.9 - 10.3 10e3/uL Final  . NEUT# 10/11/2014 5.3  1.5 - 6.5 10e3/uL Final  . HGB 10/11/2014 11.1* 11.6 - 15.9 g/dL Final  . HCT 10/11/2014 34.0* 34.8 - 46.6 % Final  . Platelets 10/11/2014 214  145 - 400 10e3/uL Final  . MCV 10/11/2014 81.1  79.5 - 101.0 fL Final  . MCH 10/11/2014 26.4  25.1 - 34.0 pg Final  . MCHC 10/11/2014 32.6  31.5 - 36.0 g/dL Final  . RBC 10/11/2014 4.19  3.70 - 5.45 10e6/uL Final  . RDW 10/11/2014 16.2* 11.2 - 14.5 % Final  . lymph# 10/11/2014 0.4* 0.9 - 3.3 10e3/uL Final  . MONO# 10/11/2014 0.4  0.1 - 0.9 10e3/uL Final  . Eosinophils Absolute 10/11/2014 0.3  0.0 - 0.5 10e3/uL Final  . Basophils Absolute 10/11/2014 0.0  0.0 - 0.1 10e3/uL Final  . NEUT% 10/11/2014 82.4* 38.4 - 76.8 % Final  . LYMPH% 10/11/2014 6.5* 14.0 - 49.7 % Final  . MONO% 10/11/2014 6.1  0.0 - 14.0 % Final  . EOS% 10/11/2014 4.3   0.0 - 7.0 % Final  . BASO% 10/11/2014 0.7  0.0 - 2.0 % Final  . Sodium 10/11/2014 135* 136 - 145 mEq/L Final  . Potassium 10/11/2014 3.9  3.5 - 5.1 mEq/L Final  . Chloride 10/11/2014 103  98 - 109 mEq/L Final  . CO2 10/11/2014 26  22 - 29 mEq/L Final  . Glucose 10/11/2014 113  70 - 140 mg/dl Final  . BUN 10/11/2014 20.0  7.0 - 26.0 mg/dL Final  . Creatinine 10/11/2014 0.8  0.6 - 1.1 mg/dL Final  . Total Bilirubin 10/11/2014 1.26* 0.20 - 1.20 mg/dL Final  . Alkaline Phosphatase 10/11/2014 119  40 - 150 U/L Final  . AST 10/11/2014 28  5 - 34 U/L Final  . ALT 10/11/2014 31  0 - 55 U/L Final  . Total Protein 10/11/2014 6.9  6.4 - 8.3 g/dL Final  . Albumin 10/11/2014 3.5  3.5 - 5.0 g/dL Final  . Calcium 10/11/2014 8.8  8.4 - 10.4 mg/dL Final  . Anion Gap 10/11/2014 6  3 - 11 mEq/L Final  . EGFR 10/11/2014 75* >90 ml/min/1.73 m2 Final   eGFR is calculated using the CKD-EPI Creatinine Equation (2009)  . Magnesium 10/11/2014 2.4  1.5 - 2.5 mg/dl Final     RADIOGRAPHIC STUDIES: No results found.  ASSESSMENT/PLAN:    Adenocarcinoma of lung Patient initiated Gilotrif oral therapy on 08/29/2014 at 40 mg per day.  She decreased the dose of the Gilotrif on 10/03/14 due to poor tolerability.  She continued to complain of significant diarrhea and mucositis; and was advised  to decrease the Gilotrif to every other day.   Since decreasing the Gilotrif to every other day- her diarrhea has essentially resolved.  She reports that she DID NOT give/obtain a stool sample to rule out C diff; since her diarrhea resolved.   Patient will return to the [facility] on Tuesday, 10/18/2014 for labs and IV fluid rehydration.  Will plan to briefly examine patient while in the area as well.  He paragraph will cancel labs and follow up visit already scheduled for Wednesday, July 6; since receded day before.    Patient completed her radiation therapy to her left neck on 09/28/2014.  She is currently undergoing  radiation treatments to her hips for known bone metastasis. Patient has plans to return to the [facility] on Friday, 10/14/2014 for IV fluid rehydration.  The plan is for the patient to obtain labs on Tuesday, 10/18/2014 at 11:00; have radiation therapy at 11:35 AM, and report to the infusion area for IV fluids at noon on that same day.  Will plan to follow up with the patient while in the infusion area as well..  Will also cancel labs and follow up visit already scheduled for 10/19/2014; since patient will be seen the previous day.       The plan is for the patient to return both Wednesday, 09/28/2014 and Friday, 09/30/2014 for IV fluid rehydration only.  Patient will return next Monday, 10/03/2014 for labs and follow up visit to see if she has recovered enough to restart her Gilotrif.   Advised patient the plan would be to decrease the Gilotrif down to 30 mg per day when she restarts the medication in hopes it will be better tolerated.       Anorexia Patient complaining of minimal appetite secondary to diarrhea and mucositis.  Patient was dehydrated today.  Patient was encouraged to eat multiple small a day; and to push fluids is much as possible.    Dehydration Patient complaining of minimal appetite secondary to diarrhea and mucositis.  Patient appears dehydrated today.  Patient was encouraged to eat multiple small a day; and to push fluids as much as possible. Patient will receive IV fluid rehydration today while cancer Center.       Diarrhea Patient initiated Gilotrif oral therapy on 08/29/2014 at 40 mg per day.  She decreased the dose of the Gilotrif on 10/03/14 due to poor tolerability.  She continued to complain of significant diarrhea and mucositis; and was advised to decrease the Gilotrif to every other day.   Since decreasing the Gilotrif to every other day- her diarrhea has essentially resolved.  She reports that she DID NOT give/obtain a stool sample to rule out C  diff; since her diarrhea resolved.    Hyperbilirubinemia Bilirubin elevated with last lab draw up to 1.26.  Will obtain repeat labs next week to recheck bilirubin.  Most likely, bilirubin elevation secondary to oral chemotherapy.  Mucositis due to chemotherapy Since patient has decreased the Gilotrif to every other day-patient's mucositis has greatly improved.    Neoplasm related pain Patient has been complaining of some chronic, increased left hip pain.  MRI of the left hip/pelvic region obtained on 09/22/2014 revealed:  Multiple metastatic bone lesions involving the pelvis as described above. The right iliac lesion likely accounts for the patient's right hip pain and the left acetabular lesions could be responsible for the patient's left hip pain.  There is evidence of cortical breakthrough involving the right iliac bone lesion. No other pathologic  fractures are demonstrated.  No significant intrapelvic abnormalities.  ____ Pt currently undergoing palliative  radiation therapy to hip area for pain control.    She continues to take the pain medication she already has at home; and denies need for refill at this time.       Patient stated understanding of all instructions; and was in agreement with this plan of care. The patient knows to call the clinic with any problems, questions or concerns.   Operative of today's visit.  Will be reviewed with Dr. Julien Nordmann.  Total time spent with patient was 25 minutes;  with greater than 75 percent of that time spent in face to face counseling regarding patient's symptoms,  and coordination of care and follow up.  Disclaimer: This note was dictated with voice recognition software. Similar sounding words can inadvertently be transcribed and may not be corrected upon review.   Drue Second, NP 10/15/2014

## 2014-10-15 NOTE — Progress Notes (Signed)
SYMPTOM MANAGEMENT CLINIC   HPI: Sherri Stafford 64 y.o. 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  Currently undergoing oral Gilotrif chemotherapy and palliative radiation therapy to her hips.  Patient returned to the [facility] today for continued complaint of chronic diarrhea, returning, mucositis, minimal appetite, and worsening dehydration.  She states she has been taking Imodium with only minimal effectiveness.  She also complains of mild nausea; but no vomiting.  She denies any recent fevers or chills.   HPI  ROS  Past Medical History  Diagnosis Date  . Arthritis   . Bilateral ovarian cysts   . GERD (gastroesophageal reflux disease)   . Strain of hip flexor 06/2013    left side torn  . Radiation     at [facility] to left hip, sacrum and brain  . Radiation 02/10/14-03/06/14    left central chest 35 gray  . Cancer     lung ca  . Bone metastases     to left hip and spine  . Encounter for antineoplastic chemotherapy 09/19/2014    Past Surgical History  Procedure Laterality Date  . Dilation and curettage of uterus  2004    x 3   . Tubal ligation    . Colonoscopy  04/05/2004    normal     has Nonspecific abnormal results of liver function study; Cough; Sarcoidosis; Unspecified vitamin D deficiency; Dyspnea; Adenocarcinoma of lung; Chest pain; Pulmonary embolism; Acute bronchitis; Bone metastases; Abdominal pain; Nausea without vomiting; Long term current use of anticoagulant therapy; Dysuria; Dehydration; Hyperbilirubinemia; Hypoalbuminemia; Neoplasm related pain; Anemia in neoplastic disease; Itching; Constipation; Encounter for antineoplastic chemotherapy; Mucositis due to chemotherapy; Diarrhea; and Anorexia on her problem list.    is allergic to other; ketoconazole; lorazepam; morphine and related; nystatin; sporanox; tylenol; vitamin d; and zofran.    Medication List       This list is accurate as of: 10/11/14 11:59 PM.  Always  use your most recent med list.               afatinib dimaleate 30 MG tablet  Commonly known as:  GILOTRIF  Take 1 tablet (30 mg total) by mouth daily. Take on an empty stomach 1hr before or 2 hrs after meals.     albuterol 108 (90 BASE) MCG/ACT inhaler  Commonly known as:  PROVENTIL HFA;VENTOLIN HFA  Inhale 1-2 puffs into the lungs every 6 (six) hours as needed for wheezing or shortness of breath.     bisacodyl 10 MG suppository  Commonly known as:  DULCOLAX  Place 1 suppository (10 mg total) rectally daily as needed for moderate constipation.     calcium carbonate 1250 (500 CA) MG chewable tablet  Commonly known as:  OS-CAL  Chew 4 tablets by mouth daily.     clindamycin 1 % external solution  Commonly known as:  CLEOCIN-T  Apply topically 2 (two) times daily.     codeine 30 MG tablet  Take 1 tablet (30 mg total) by mouth every 4 (four) hours as needed (cough).     [employer] SENNA PLUS 8.6-50 MG per tablet  Generic drug:  senna-docusate     diphenhydrAMINE 25 MG tablet  Commonly known as:  SOMINEX  Take 25 mg by mouth at bedtime as needed for sleep. Takes 2 tablets every 3-4 hours during the night.     diphenoxylate-atropine 2.5-0.025 MG per tablet  Commonly known as:  LOMOTIL  Take 2 tablets by  mouth 4 (four) times daily as needed for diarrhea or loose stools.     docusate sodium 100 MG capsule  Commonly known as:  COLACE  Take 100 mg by mouth 2 (two) times daily.     dronabinol 2.5 MG capsule  Commonly known as:  MARINOL  Take 1 capsule (2.5 mg total) by mouth 2 (two) times daily before a meal.     esomeprazole 40 MG capsule  Commonly known as:  NEXIUM  Take 1 capsule (40 mg total) by mouth 2 (two) times daily before a meal.     fentaNYL 50 MCG/HR  Commonly known as:  DURAGESIC - dosed mcg/hr  Place 1 patch (50 mcg total) onto the skin every 3 (three) days.     glycerin adult 2 G Supp  Place 1 suppository rectally once as needed for moderate constipation.      magic mouthwash w/lidocaine Soln  Take 5 mLs by mouth 4 (four) times daily as needed for mouth pain.     naproxen sodium 220 MG tablet  Commonly known as:  ANAPROX  Take 220 mg by mouth 2 (two) times daily with a meal.     polyethylene glycol packet  Commonly known as:  MIRALAX / GLYCOLAX  Take 17 g by mouth daily as needed for moderate constipation.     prochlorperazine 10 MG tablet  Commonly known as:  COMPAZINE  Take 1 tablet (10 mg total) by mouth every 6 (six) hours as needed for nausea or vomiting.     rivaroxaban 20 MG Tabs tablet  Commonly known as:  XARELTO  Take 1 tablet (20 mg total) by mouth daily with supper.     sucralfate 1 GM/10ML suspension  Commonly known as:  CARAFATE  Take 10 mLs (1 g total) by mouth 4 (four) times daily -  with meals and at bedtime.     traMADol 50 MG tablet  Commonly known as:  ULTRAM  TAKE 1 TABLET BY MOUTH EVERY 6 HOURS AS NEEDED         PHYSICAL EXAMINATION  Oncology Vitals 10/14/2014 10/14/2014 10/11/2014 10/03/2014 09/30/2014 09/30/2014 09/28/2014  Height - - - - - - -  Weight - - 52.209 kg 53.751 kg - - -  Weight (lbs) - - 115 lbs 2 oz 118 lbs 8 oz - - -  BMI (kg/m2) - - - - - - -  Temp - 97.9 97.7 98.5 - 98.2 98.1  Pulse 90 112 111 104 - 96 104  Resp - - '18 20 18 18 17  ' SpO2 - 100 100 99 - 100 -  BSA (m2) - - - - - - -   BP Readings from Last 3 Encounters:  10/14/14 102/61  10/11/14 117/77  10/03/14 109/74    Physical Exam  Constitutional: She is oriented to person, place, and time.  Patient appears fatigued, weak, frail, and chronically ill.  HENT:  Head: Normocephalic and atraumatic.  Mild erythema to time and lower gum region; just very tender.  Eyes: Conjunctivae and EOM are normal. Pupils are equal, round, and reactive to light. Right eye exhibits no discharge. Left eye exhibits no discharge. No scleral icterus.  Neck: Normal range of motion. Neck supple. No JVD present. No tracheal deviation present. No thyromegaly  present.  Cardiovascular: Normal rate, regular rhythm, normal heart sounds and intact distal pulses.   Pulmonary/Chest: Effort normal and breath sounds normal. No stridor. No respiratory distress. She has no wheezes. She has no rales. She  exhibits no tenderness.  Abdominal: Soft. Bowel sounds are normal. She exhibits no distension and no mass. There is no tenderness. There is no rebound and no guarding.  Musculoskeletal: Normal range of motion. She exhibits no edema or tenderness.  Lymphadenopathy:    She has no cervical adenopathy.  Neurological: She is alert and oriented to person, place, and time.  Patient in wheelchair during exam.  Skin: Skin is warm and dry. No rash noted. No erythema. There is pallor.  Psychiatric: Affect normal.  Nursing note and vitals reviewed.   LABORATORY DATA:. Appointment on 10/11/2014  Component Date Value Ref Range Status  . WBC 10/11/2014 6.5  3.9 - 10.3 10e3/uL Final  . NEUT# 10/11/2014 5.3  1.5 - 6.5 10e3/uL Final  . HGB 10/11/2014 11.1* 11.6 - 15.9 g/dL Final  . HCT 10/11/2014 34.0* 34.8 - 46.6 % Final  . Platelets 10/11/2014 214  145 - 400 10e3/uL Final  . MCV 10/11/2014 81.1  79.5 - 101.0 fL Final  . MCH 10/11/2014 26.4  25.1 - 34.0 pg Final  . MCHC 10/11/2014 32.6  31.5 - 36.0 g/dL Final  . RBC 10/11/2014 4.19  3.70 - 5.45 10e6/uL Final  . RDW 10/11/2014 16.2* 11.2 - 14.5 % Final  . lymph# 10/11/2014 0.4* 0.9 - 3.3 10e3/uL Final  . MONO# 10/11/2014 0.4  0.1 - 0.9 10e3/uL Final  . Eosinophils Absolute 10/11/2014 0.3  0.0 - 0.5 10e3/uL Final  . Basophils Absolute 10/11/2014 0.0  0.0 - 0.1 10e3/uL Final  . NEUT% 10/11/2014 82.4* 38.4 - 76.8 % Final  . LYMPH% 10/11/2014 6.5* 14.0 - 49.7 % Final  . MONO% 10/11/2014 6.1  0.0 - 14.0 % Final  . EOS% 10/11/2014 4.3  0.0 - 7.0 % Final  . BASO% 10/11/2014 0.7  0.0 - 2.0 % Final  . Sodium 10/11/2014 135* 136 - 145 mEq/L Final  . Potassium 10/11/2014 3.9  3.5 - 5.1 mEq/L Final  . Chloride 10/11/2014  103  98 - 109 mEq/L Final  . CO2 10/11/2014 26  22 - 29 mEq/L Final  . Glucose 10/11/2014 113  70 - 140 mg/dl Final  . BUN 10/11/2014 20.0  7.0 - 26.0 mg/dL Final  . Creatinine 10/11/2014 0.8  0.6 - 1.1 mg/dL Final  . Total Bilirubin 10/11/2014 1.26* 0.20 - 1.20 mg/dL Final  . Alkaline Phosphatase 10/11/2014 119  40 - 150 U/L Final  . AST 10/11/2014 28  5 - 34 U/L Final  . ALT 10/11/2014 31  0 - 55 U/L Final  . Total Protein 10/11/2014 6.9  6.4 - 8.3 g/dL Final  . Albumin 10/11/2014 3.5  3.5 - 5.0 g/dL Final  . Calcium 10/11/2014 8.8  8.4 - 10.4 mg/dL Final  . Anion Gap 10/11/2014 6  3 - 11 mEq/L Final  . EGFR 10/11/2014 75* >90 ml/min/1.73 m2 Final   eGFR is calculated using the CKD-EPI Creatinine Equation (2009)  . Magnesium 10/11/2014 2.4  1.5 - 2.5 mg/dl Final     RADIOGRAPHIC STUDIES: No results found.  ASSESSMENT/PLAN:    Adenocarcinoma of lung Patient initiated Gilotrif oral therapy on 08/29/2014 at 40 mg per day.  She decreased the dose of the Gilotrif on 10/03/14 due to poor tolerability.  She continues to complain significant diarrhea and mucositis.  She feels continued dehydration, as well.  Patient was advised to decrease the Gilotrif to every other day to see if that helps with her symptoms.   Patient completed her radiation therapy to her left  neck on 09/28/2014.  She is currently undergoing radiation treatments to her hips for known bone metastasis. Patient has plans to return to the [facility] on Friday, 10/14/2014 for IV fluid rehydration.  The plan is for the patient to obtain labs on Tuesday, 10/18/2014 at 11:00; have radiation therapy at 11:35 AM, and report to the infusion area for IV fluids at noon on that same day.  Will plan to follow up with the patient while in the infusion area as well..  Will also cancel labs and follow up visit already scheduled for 10/19/2014; since patient will be seen the previous day.       The plan is for the patient to  return both Wednesday, 09/28/2014 and Friday, 09/30/2014 for IV fluid rehydration only.  Patient will return next Monday, 10/03/2014 for labs and follow up visit to see if she has recovered enough to restart her Gilotrif.   Advised patient the plan would be to decrease the Gilotrif down to 30 mg per day when she restarts the medication in hopes it will be better tolerated.     Anorexia Patient complaining of minimal appetite secondary to diarrhea and mucositis.  Patient was dehydrated today.  Patient was encouraged to eat multiple small a day; and to push fluids is much as possible.  Dehydration Patient complaining of minimal appetite secondary to diarrhea and mucositis.  Patient appears dehydrated today.  Patient was encouraged to eat multiple small a day; and to push fluids is much as possible.patient will receive IV fluid rehydration today while cancer Center.  She will also return this coming Friday.  10/14/2014 for additional IV fluid rehydration.   Diarrhea Patient has been experiencing significant diarrhea since initiating her oral Gilotrif chemotherapy.  Patient has been taking Imodium with only minimal effectiveness.  Patient was given a prescription for Lomotil to alternate with the Imodium on an as-needed basis.patient is also advised to decrease the Gilotrif to every other day to see if that helps to decrease.  The diarrhea.  Patient was given a stool kit an attempt to rule out C. Difficile.  Hyperbilirubinemia Bilirubin elevated at 1.26 today.  Most likely, secondary to Gilotrif.  Will continue to monitor closely.  Mucositis due to chemotherapy Patient complaining of returning mucositis since reinitiating the Gilotrif.  Advised pt to try taking the Gilotrif on an every other day basis instead; to see if that helps.   Neoplasm related pain Patient has been complaining of some chronic, increased left hip pain.  MRI of the left hip/pelvic region obtained on 09/22/2014  revealed:  Multiple metastatic bone lesions involving the pelvis as described above. The right iliac lesion likely accounts for the patient's right hip pain and the left acetabular lesions could be responsible for the patient's left hip pain.  There is evidence of cortical breakthrough involving the right iliac bone lesion. No other pathologic fractures are demonstrated.  No significant intrapelvic abnormalities.  ____ Pt currently undergoing palliative  radiation therapy to hip area for pain control.    She continues to take the pain medication she already has at home; and denies need for refill at this time.    Patient stated understanding of all instructions; and was in agreement with this plan of care. The patient knows to call the clinic with any problems, questions or concerns.   This was a shared visit with Dr. Julien Nordmann today.  Total time spent with patient was 40 minutes;  with greater than 75 percent of that time  spent in face to face counseling regarding patient's symptoms,  and coordination of care and follow up.  Disclaimer: This note was dictated with voice recognition software. Similar sounding words can inadvertently be transcribed and may not be corrected upon review.   Drue Second, NP 10/15/2014   ADDENDUM: Hematology/Oncology Attending: I had a face to face encounter with the patient. I recommended her care plan.this is a very pleasant 64 years old white female with metastatic non-small cell lung cancer, adenocarcinoma who is currently undergoing oral treatment with Gilotrif 30 mg by mouth daily. This was a reduction from her initial dose of 40 mg by mouth daily. The patient started having a few more episodes of diarrhea after resuming her treatment but much less than the previous dose. She also has mild oral mucositis.we will arrange for the patient to receive IV hydration with normal saline. She was also advised to take Imodium and Lomotil as needed for the  diarrhea. If she has no improvement in her symptoms, I recommended for the patient to take due to treated every other day to see if her adverse effect would be better controlled but we may consider reducing her dose to 20 mg at some point if the imaging studies showed response to the treatment. She was advised to call immediately if she has any concerning symptoms in the interval.  Disclaimer: This note was dictated with voice recognition software. Similar sounding words can inadvertently be transcribed and may be missed upon review. Eilleen Kempf., MD 10/15/2014

## 2014-10-15 NOTE — Assessment & Plan Note (Signed)
Patient initiated Gilotrif oral therapy on 08/29/2014 at 40 mg per day.  She decreased the dose of the Gilotrif on 10/03/14 due to poor tolerability.  She continues to complain significant diarrhea and mucositis.  She feels continued dehydration, as well.  Patient was advised to decrease the Gilotrif to every other day to see if that helps with her symptoms.   Patient completed her radiation therapy to her left neck on 09/28/2014.  She is currently undergoing radiation treatments to her hips for known bone metastasis. Patient has plans to return to the [facility] on Friday, 10/14/2014 for IV fluid rehydration.  The plan is for the patient to obtain labs on Tuesday, 10/18/2014 at 11:00; have radiation therapy at 11:35 AM, and report to the infusion area for IV fluids at noon on that same day.  Will plan to follow up with the patient while in the infusion area as well..  Will also cancel labs and follow up visit already scheduled for 10/19/2014; since patient will be seen the previous day.       The plan is for the patient to return both Wednesday, 09/28/2014 and Friday, 09/30/2014 for IV fluid rehydration only.  Patient will return next Monday, 10/03/2014 for labs and follow up visit to see if she has recovered enough to restart her Gilotrif.   Advised patient the plan would be to decrease the Gilotrif down to 30 mg per day when she restarts the medication in hopes it will be better tolerated.

## 2014-10-15 NOTE — Assessment & Plan Note (Signed)
Patient complaining of minimal appetite secondary to diarrhea and mucositis.  Patient appears dehydrated today.  Patient was encouraged to eat multiple small a day; and to push fluids is much as possible.patient will receive IV fluid rehydration today while cancer Center.  She will also return this coming Friday.  10/14/2014 for additional IV fluid rehydration.

## 2014-10-15 NOTE — Assessment & Plan Note (Addendum)
Patient initiated Gilotrif oral therapy on 08/29/2014 at 40 mg per day.  She decreased the dose of the Gilotrif on 10/03/14 due to poor tolerability.  She continued to complain of significant diarrhea and mucositis; and was advised to decrease the Gilotrif to every other day.   Since decreasing the Gilotrif to every other day- her diarrhea has essentially resolved.  She reports that she DID NOT give/obtain a stool sample to rule out C diff; since her diarrhea resolved.   Patient will return to the [facility] on Tuesday, 10/18/2014 for labs and IV fluid rehydration.  Will plan to briefly examine patient while in the area as well.  He paragraph will cancel labs and follow up visit already scheduled for Wednesday, July 6; since receded day before.    Patient completed her radiation therapy to her left neck on 09/28/2014.  She is currently undergoing radiation treatments to her hips for known bone metastasis. Patient has plans to return to the [facility] on Friday, 10/14/2014 for IV fluid rehydration.  The plan is for the patient to obtain labs on Tuesday, 10/18/2014 at 11:00; have radiation therapy at 11:35 AM, and report to the infusion area for IV fluids at noon on that same day.  Will plan to follow up with the patient while in the infusion area as well..  Will also cancel labs and follow up visit already scheduled for 10/19/2014; since patient will be seen the previous day.       The plan is for the patient to return both Wednesday, 09/28/2014 and Friday, 09/30/2014 for IV fluid rehydration only.  Patient will return next Monday, 10/03/2014 for labs and follow up visit to see if she has recovered enough to restart her Gilotrif.   Advised patient the plan would be to decrease the Gilotrif down to 30 mg per day when she restarts the medication in hopes it will be better tolerated.

## 2014-10-15 NOTE — Assessment & Plan Note (Signed)
Bilirubin elevated at 1.26 today.  Most likely, secondary to Gilotrif.  Will continue to monitor closely.

## 2014-10-15 NOTE — Assessment & Plan Note (Addendum)
Patient has been experiencing significant diarrhea since initiating her oral Gilotrif chemotherapy.  Patient has been taking Imodium with only minimal effectiveness.  Patient was given a prescription for Lomotil to alternate with the Imodium on an as-needed basis.patient is also advised to decrease the Gilotrif to every other day to see if that helps to decrease.  The diarrhea.  Patient was given a stool kit an attempt to rule out C. Difficile.

## 2014-10-18 ENCOUNTER — Encounter: Payer: Self-pay | Admitting: Radiation Oncology

## 2014-10-18 ENCOUNTER — Telehealth: Payer: Self-pay | Admitting: Nurse Practitioner

## 2014-10-18 ENCOUNTER — Ambulatory Visit (HOSPITAL_BASED_OUTPATIENT_CLINIC_OR_DEPARTMENT_OTHER): Payer: BC Managed Care – PPO

## 2014-10-18 ENCOUNTER — Ambulatory Visit: Payer: BC Managed Care – PPO

## 2014-10-18 ENCOUNTER — Other Ambulatory Visit (HOSPITAL_BASED_OUTPATIENT_CLINIC_OR_DEPARTMENT_OTHER): Payer: BC Managed Care – PPO

## 2014-10-18 ENCOUNTER — Ambulatory Visit
Admission: RE | Admit: 2014-10-18 | Discharge: 2014-10-18 | Disposition: A | Discharge status: Home / Self Care | Payer: BC Managed Care – PPO | Source: Ambulatory Visit | Attending: Radiation Oncology | Admitting: Radiation Oncology

## 2014-10-18 ENCOUNTER — Ambulatory Visit (HOSPITAL_BASED_OUTPATIENT_CLINIC_OR_DEPARTMENT_OTHER): Payer: BC Managed Care – PPO | Admitting: Nurse Practitioner

## 2014-10-18 VITALS — BP 101/58 | HR 93 | Temp 97.8°F | Resp 16 | Wt 115.9 lb

## 2014-10-18 VITALS — BP 115/61 | HR 86 | Temp 98.2°F | Resp 18

## 2014-10-18 DIAGNOSIS — C3432 Malignant neoplasm of lower lobe, left bronchus or lung: Secondary | ICD-10-CM | POA: Diagnosis not present

## 2014-10-18 DIAGNOSIS — E860 Dehydration: Secondary | ICD-10-CM

## 2014-10-18 DIAGNOSIS — C7931 Secondary malignant neoplasm of brain: Secondary | ICD-10-CM | POA: Diagnosis not present

## 2014-10-18 DIAGNOSIS — C7951 Secondary malignant neoplasm of bone: Secondary | ICD-10-CM

## 2014-10-18 DIAGNOSIS — R197 Diarrhea, unspecified: Secondary | ICD-10-CM

## 2014-10-18 DIAGNOSIS — C3490 Malignant neoplasm of unspecified part of unspecified bronchus or lung: Secondary | ICD-10-CM

## 2014-10-18 DIAGNOSIS — R630 Anorexia: Secondary | ICD-10-CM | POA: Diagnosis not present

## 2014-10-18 DIAGNOSIS — Z95828 Presence of other vascular implants and grafts: Secondary | ICD-10-CM

## 2014-10-18 LAB — COMPREHENSIVE METABOLIC PANEL (CC13)
ALT: 15 U/L (ref 0–55)
AST: 15 U/L (ref 5–34)
Albumin: 3.2 g/dL — ABNORMAL LOW (ref 3.5–5.0)
Alkaline Phosphatase: 84 U/L (ref 40–150)
Anion Gap: 11 mEq/L (ref 3–11)
BILIRUBIN TOTAL: 1.22 mg/dL — AB (ref 0.20–1.20)
BUN: 14 mg/dL (ref 7.0–26.0)
CO2: 25 meq/L (ref 22–29)
Calcium: 9.3 mg/dL (ref 8.4–10.4)
Chloride: 100 mEq/L (ref 98–109)
Creatinine: 0.7 mg/dL (ref 0.6–1.1)
EGFR: 87 mL/min/{1.73_m2} — AB (ref >90)
GLUCOSE: 114 mg/dL (ref 70–140)
POTASSIUM: 4 meq/L (ref 3.5–5.1)
SODIUM: 136 meq/L (ref 136–145)
TOTAL PROTEIN: 6.5 g/dL (ref 6.4–8.3)

## 2014-10-18 LAB — CBC WITH DIFFERENTIAL/PLATELET
BASO%: 0.4 % (ref 0.0–2.0)
BASOS ABS: 0 10*3/uL (ref 0.0–0.1)
EOS%: 4.7 % (ref 0.0–7.0)
Eosinophils Absolute: 0.2 10*3/uL (ref 0.0–0.5)
HCT: 30.6 % — ABNORMAL LOW (ref 34.8–46.6)
HGB: 10 g/dL — ABNORMAL LOW (ref 11.6–15.9)
LYMPH%: 6.5 % — ABNORMAL LOW (ref 14.0–49.7)
MCH: 26.4 pg (ref 25.1–34.0)
MCHC: 32.8 g/dL (ref 31.5–36.0)
MCV: 80.8 fL (ref 79.5–101.0)
MONO#: 0.3 10*3/uL (ref 0.1–0.9)
MONO%: 7.1 % (ref 0.0–14.0)
NEUT#: 3.5 10*3/uL (ref 1.5–6.5)
NEUT%: 81.3 % — ABNORMAL HIGH (ref 38.4–76.8)
Platelets: 162 10*3/uL (ref 145–400)
RBC: 3.8 10*6/uL (ref 3.70–5.45)
RDW: 16.1 % — ABNORMAL HIGH (ref 11.2–14.5)
WBC: 4.3 10*3/uL (ref 3.9–10.3)
lymph#: 0.3 10*3/uL — ABNORMAL LOW (ref 0.9–3.3)

## 2014-10-18 MED ORDER — HEPARIN SOD (PORK) LOCK FLUSH 100 UNIT/ML IV SOLN
500.0000 [IU] | Freq: Once | INTRAVENOUS | Status: AC
  Administered 2014-10-18: 500 [IU] via INTRAVENOUS
  Filled 2014-10-18: qty 5

## 2014-10-18 MED ORDER — SODIUM CHLORIDE 0.9 % IV SOLN
INTRAVENOUS | Status: AC
  Administered 2014-10-18: 13:00:00 via INTRAVENOUS

## 2014-10-18 MED ORDER — SODIUM CHLORIDE 0.9 % IJ SOLN
10.0000 mL | INTRAMUSCULAR | Status: DC | PRN
  Administered 2014-10-18 (×2): 10 mL via INTRAVENOUS
  Filled 2014-10-18: qty 10

## 2014-10-18 NOTE — Progress Notes (Signed)
Weight and vitals stable. Reports right hip pain 5 on scale of 0-10. Reports weakness. Patient riding in wheelchair today due to weakness. Denies any difficulty ambulating independently. Denies numbness or tingling of either lower extremity. Reports control of both bowel and bladder. Reports fentanyl works well to manage hip pain.  BP 101/58 mmHg  Pulse 93  Temp(Src) 97.8 F (36.6 C) (Oral)  Resp 16  Wt 115 lb 14.4 oz (52.572 kg)  SpO2 100%  LMP 01/14/2008 Wt Readings from Last 3 Encounters:  10/18/14 115 lb 14.4 oz (52.572 kg)  10/11/14 115 lb 1.6 oz (52.209 kg)  10/03/14 118 lb 8 oz (53.751 kg)

## 2014-10-18 NOTE — Patient Instructions (Signed)
Dehydration, Adult Dehydration is when you lose more fluids from the body than you take in. Vital organs like the kidneys, brain, and heart cannot function without a proper amount of fluids and salt. Any loss of fluids from the body can cause dehydration.  CAUSES   Vomiting.  Diarrhea.  Excessive sweating.  Excessive urine output.  Fever. SYMPTOMS  Mild dehydration  Thirst.  Dry lips.  Slightly dry mouth. Moderate dehydration  Very dry mouth.  Sunken eyes.  Skin does not bounce back quickly when lightly pinched and released.  Dark urine and decreased urine production.  Decreased tear production.  Headache. Severe dehydration  Very dry mouth.  Extreme thirst.  Rapid, weak pulse (more than 100 beats per minute at rest).  Cold hands and feet.  Not able to sweat in spite of heat and temperature.  Rapid breathing.  Blue lips.  Confusion and lethargy.  Difficulty being awakened.  Minimal urine production.  No tears. DIAGNOSIS  Your caregiver will diagnose dehydration based on your symptoms and your exam. Blood and urine tests will help confirm the diagnosis. The diagnostic evaluation should also identify the cause of dehydration. TREATMENT  Treatment of mild or moderate dehydration can often be done at home by increasing the amount of fluids that you drink. It is best to drink small amounts of fluid more often. Drinking too much at one time can make vomiting worse. Refer to the home care instructions below. Severe dehydration needs to be treated at the hospital where you will probably be given intravenous (IV) fluids that contain water and electrolytes. HOME CARE INSTRUCTIONS   Ask your caregiver about specific rehydration instructions.  Drink enough fluids to keep your urine clear or pale yellow.  Drink small amounts frequently if you have nausea and vomiting.  Eat as you normally do.  Avoid:  Foods or drinks high in sugar.  Carbonated  drinks.  Juice.  Extremely hot or cold fluids.  Drinks with caffeine.  Fatty, greasy foods.  Alcohol.  Tobacco.  Overeating.  Gelatin desserts.  Wash your hands well to avoid spreading bacteria and viruses.  Only take over-the-counter or prescription medicines for pain, discomfort, or fever as directed by your caregiver.  Ask your caregiver if you should continue all prescribed and over-the-counter medicines.  Keep all follow-up appointments with your caregiver. SEEK MEDICAL CARE IF:  You have abdominal pain and it increases or stays in one area (localizes).  You have a rash, stiff neck, or severe headache.  You are irritable, sleepy, or difficult to awaken.  You are weak, dizzy, or extremely thirsty. SEEK IMMEDIATE MEDICAL CARE IF:   You are unable to keep fluids down or you get worse despite treatment.  You have frequent episodes of vomiting or diarrhea.  You have blood or green matter (bile) in your vomit.  You have blood in your stool or your stool looks black and tarry.  You have not urinated in 6 to 8 hours, or you have only urinated a small amount of very dark urine.  You have a fever.  You faint. MAKE SURE YOU:   Understand these instructions.  Will watch your condition.  Will get help right away if you are not doing well or get worse. Document Released: 04/01/2005 Document Revised: 06/24/2011 Document Reviewed: 11/19/2010 ExitCare Patient Information 2015 [employer]. This information is not intended to replace advice given to you by your health care provider. Make sure you discuss any questions you have with your health care   provider.  

## 2014-10-18 NOTE — Progress Notes (Signed)
  Radiation Oncology         [phone] ________________________________  Name: Sherri Stafford MRN: [id number]  Date: 10/18/2014  DOB: 1950-04-27  Weekly Radiation Therapy Management  DIAGNOSIS: stage IV (T2a, N3, M1b) non-small cell lung cancer  Current Dose: 8 Gy     Planned Dose:  20 Gy  Narrative . . . . . . . . The patient presents for routine under treatment assessment.                                   The patient is having nausea with her treatments. She is receiving IV fluid supplementation Twice weekly in light of her nausea and Generalized weakness.  No improvement in her pain thus far.                                 Set-up films were reviewed.                                 The chart was checked. Physical Findings. . .  weight is 115 lb 14.4 oz (52.572 kg). Her oral temperature is 97.8 F (36.6 C). Her blood pressure is 101/58 and her pulse is 93. Her respiration is 16 and oxygen saturation is 100%. . The lungs are clear. The heart is regular rhythm and rate. The abdomen is soft and nontender with normal bowel sounds.  Impression . . . . . . . The patient is tolerating radiation. Plan . . . . . . . . . . . . Continue treatment as planned.  ________________________________   Blair Promise, PhD, MD

## 2014-10-18 NOTE — Patient Instructions (Signed)

## 2014-10-18 NOTE — Telephone Encounter (Signed)
Schedule labs/flush and apt with NP/CB who will see pt in infusion room, sent msg to add IV Fluids... KJ

## 2014-10-19 ENCOUNTER — Ambulatory Visit: Payer: BC Managed Care – PPO | Admitting: Nurse Practitioner

## 2014-10-19 ENCOUNTER — Ambulatory Visit: Payer: BC Managed Care – PPO

## 2014-10-19 ENCOUNTER — Other Ambulatory Visit: Payer: BC Managed Care – PPO

## 2014-10-19 ENCOUNTER — Ambulatory Visit
Admission: RE | Admit: 2014-10-19 | Discharge: 2014-10-19 | Disposition: A | Discharge status: Home / Self Care | Payer: BC Managed Care – PPO | Source: Ambulatory Visit | Attending: Radiation Oncology | Admitting: Radiation Oncology

## 2014-10-19 ENCOUNTER — Other Ambulatory Visit: Payer: Self-pay | Admitting: Internal Medicine

## 2014-10-19 DIAGNOSIS — C7951 Secondary malignant neoplasm of bone: Secondary | ICD-10-CM

## 2014-10-19 DIAGNOSIS — C3490 Malignant neoplasm of unspecified part of unspecified bronchus or lung: Secondary | ICD-10-CM | POA: Diagnosis not present

## 2014-10-20 ENCOUNTER — Telehealth: Payer: Self-pay | Admitting: *Deleted

## 2014-10-20 ENCOUNTER — Encounter: Payer: Self-pay | Admitting: Nurse Practitioner

## 2014-10-20 ENCOUNTER — Ambulatory Visit
Admission: RE | Admit: 2014-10-20 | Discharge: 2014-10-20 | Disposition: A | Discharge status: Home / Self Care | Payer: BC Managed Care – PPO | Source: Ambulatory Visit | Attending: Radiation Oncology | Admitting: Radiation Oncology

## 2014-10-20 ENCOUNTER — Telehealth: Payer: Self-pay | Admitting: Internal Medicine

## 2014-10-20 ENCOUNTER — Ambulatory Visit: Payer: BC Managed Care – PPO

## 2014-10-20 DIAGNOSIS — C3490 Malignant neoplasm of unspecified part of unspecified bronchus or lung: Secondary | ICD-10-CM | POA: Diagnosis not present

## 2014-10-20 NOTE — Assessment & Plan Note (Signed)
Patient initiated Gilotrif oral therapy on 08/29/2014 at 40 mg per day.  She decreased the dose of the Gilotrif on 10/03/14 due to poor tolerability.  She continued to complain of significant diarrhea and mucositis; and was advised to decrease the Gilotrif to every other day.   Patient states that all of her diarrhea has since resolved.  She reports that she did not obtain a stool sample for C. difficile since her diarrhea did resolve.

## 2014-10-20 NOTE — Assessment & Plan Note (Signed)
Patient complaining of minimal appetite; although both her diarrhea and her mucositis have greatly improved..  Patient was dehydrated today.  Patient was encouraged to eat multiple small a day; and to push fluids is much as possible.

## 2014-10-20 NOTE — Telephone Encounter (Signed)
Tried to call pt to ask her again if she would like to come in tomorrow versus Saturday 10/22/14 for IV fluids.  Pt will already be at cancer center tomorrow for radiation.    Will attempt to reach pt again in morning.   Voice mail left on pt's phone.

## 2014-10-20 NOTE — Telephone Encounter (Signed)
LM for pt to rtn call- Pt will be in the cancer center Friday 7/8 for radiation. Pt was also to rtn for IVF. Due to scheduling pt has appt on Saturday morning. [facility] would like to see if pt would come in after radiation tomorrow and complete IVF in one of the [facility] rooms.

## 2014-10-20 NOTE — Progress Notes (Signed)
SYMPTOM MANAGEMENT CLINIC   HPI: Sherri Stafford 64 y.o. 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  Currently undergoing oral Gilotrif chemotherapy and palliative radiation therapy to her hips.  Patient returns to the [place] today with continued complaint of dehydration.  She is happy to report that her diarrhea has essentially resolved since decreasing the oral Gilotrif to every other day her mucositis issues have greatly improved as well.  She denies any new symptoms whatsoever.  She denies any recent fevers or chills.   Diarrhea     Review of Systems  Gastrointestinal: Positive for diarrhea.    Past Medical History  Diagnosis Date  . Arthritis   . Bilateral ovarian cysts   . GERD (gastroesophageal reflux disease)   . Strain of hip flexor 06/2013    left side torn  . Radiation     at [facility] to left hip, sacrum and brain  . Radiation 02/10/14-03/06/14    left central chest 35 gray  . Cancer     lung ca  . Bone metastases     to left hip and spine  . Encounter for antineoplastic chemotherapy 09/19/2014    Past Surgical History  Procedure Laterality Date  . Dilation and curettage of uterus  2004    x 3   . Tubal ligation    . Colonoscopy  04/05/2004    normal     has Nonspecific abnormal results of liver function study; Cough; Sarcoidosis; Unspecified vitamin D deficiency; Dyspnea; Adenocarcinoma of lung; Chest pain; Pulmonary embolism; Acute bronchitis; Bone metastases; Abdominal pain; Nausea without vomiting; Long term current use of anticoagulant therapy; Dysuria; Dehydration; Hyperbilirubinemia; Hypoalbuminemia; Neoplasm related pain; Anemia in neoplastic disease; Itching; Constipation; Encounter for antineoplastic chemotherapy; Mucositis due to chemotherapy; Diarrhea; and Anorexia on her problem list.    is allergic to other; ketoconazole; lorazepam; morphine and related; nystatin; sporanox; tylenol; vitamin d; and  zofran.    Medication List       This list is accurate as of: 10/18/14 11:59 PM.  Always use your most recent med list.               afatinib dimaleate 30 MG tablet  Commonly known as:  GILOTRIF  Take 1 tablet (30 mg total) by mouth daily. Take on an empty stomach 1hr before or 2 hrs after meals.     albuterol 108 (90 BASE) MCG/ACT inhaler  Commonly known as:  PROVENTIL HFA;VENTOLIN HFA  Inhale 1-2 puffs into the lungs every 6 (six) hours as needed for wheezing or shortness of breath.     bisacodyl 10 MG suppository  Commonly known as:  DULCOLAX  Place 1 suppository (10 mg total) rectally daily as needed for moderate constipation.     calcium carbonate 1250 (500 CA) MG chewable tablet  Commonly known as:  OS-CAL  Chew 4 tablets by mouth daily.     clindamycin 1 % external solution  Commonly known as:  CLEOCIN-T  Apply topically 2 (two) times daily.     codeine 30 MG tablet  Take 1 tablet (30 mg total) by mouth every 4 (four) hours as needed (cough).     [employer] SENNA PLUS 8.6-50 MG per tablet  Generic drug:  senna-docusate     diphenhydrAMINE 25 MG tablet  Commonly known as:  SOMINEX  Take 25 mg by mouth at bedtime as needed for sleep. Takes 2 tablets every 3-4 hours during the night.  diphenoxylate-atropine 2.5-0.025 MG per tablet  Commonly known as:  LOMOTIL  Take 2 tablets by mouth 4 (four) times daily as needed for diarrhea or loose stools.     docusate sodium 100 MG capsule  Commonly known as:  COLACE  Take 100 mg by mouth 2 (two) times daily.     dronabinol 2.5 MG capsule  Commonly known as:  MARINOL  Take 1 capsule (2.5 mg total) by mouth 2 (two) times daily before a meal.     esomeprazole 40 MG capsule  Commonly known as:  NEXIUM  Take 1 capsule (40 mg total) by mouth 2 (two) times daily before a meal.     fentaNYL 50 MCG/HR  Commonly known as:  DURAGESIC - dosed mcg/hr  Place 1 patch (50 mcg total) onto the skin every 3 (three) days.     glycerin  adult 2 G Supp  Place 1 suppository rectally once as needed for moderate constipation.     magic mouthwash w/lidocaine Soln  Take 5 mLs by mouth 4 (four) times daily as needed for mouth pain.     naproxen sodium 220 MG tablet  Commonly known as:  ANAPROX  Take 220 mg by mouth 2 (two) times daily with a meal.     polyethylene glycol packet  Commonly known as:  MIRALAX / GLYCOLAX  Take 17 g by mouth daily as needed for moderate constipation.     prochlorperazine 10 MG tablet  Commonly known as:  COMPAZINE  Take 1 tablet (10 mg total) by mouth every 6 (six) hours as needed for nausea or vomiting.     rivaroxaban 20 MG Tabs tablet  Commonly known as:  XARELTO  Take 1 tablet (20 mg total) by mouth daily with supper.     sucralfate 1 GM/10ML suspension  Commonly known as:  CARAFATE  Take 10 mLs (1 g total) by mouth 4 (four) times daily -  with meals and at bedtime.     traMADol 50 MG tablet  Commonly known as:  ULTRAM  TAKE 1 TABLET BY MOUTH EVERY 6 HOURS AS NEEDED         PHYSICAL EXAMINATION  Oncology Vitals 10/18/2014 10/18/2014 10/18/2014 10/14/2014 10/14/2014 10/11/2014 10/03/2014  Height - - - - - - -  Weight - - 52.572 kg - - 52.209 kg 53.751 kg  Weight (lbs) - - 115 lbs 14 oz - - 115 lbs 2 oz 118 lbs 8 oz  BMI (kg/m2) - - - - - - -  Temp - 98.2 97.8 - 97.9 97.7 98.5  Pulse 86 91 93 90 112 111 104  Resp - 18 16 - - 18 20  SpO2 - 99 100 - 100 100 99  BSA (m2) - - - - - - -   BP Readings from Last 3 Encounters:  10/18/14 115/61  10/18/14 101/58  10/14/14 102/61    Physical Exam  Constitutional: She is oriented to person, place, and time.  Patient appears fatigued, weak, frail, and chronically ill.  HENT:  Head: Normocephalic and atraumatic.  Mild erythema to time and lower gum region; just very tender.  Eyes: Conjunctivae and EOM are normal. Pupils are equal, round, and reactive to light. Right eye exhibits no discharge. Left eye exhibits no discharge. No scleral icterus.   Neck: Normal range of motion.  Pulmonary/Chest: Effort normal and breath sounds normal. No stridor. No respiratory distress.  Musculoskeletal: Normal range of motion.  Neurological: She is alert and oriented to person,  place, and time.  Patient in wheelchair during exam.  Skin: There is pallor.  Psychiatric: Affect normal.  Nursing note and vitals reviewed.   LABORATORY DATA:. Appointment on 10/18/2014  Component Date Value Ref Range Status  . WBC 10/18/2014 4.3  3.9 - 10.3 10e3/uL Final  . NEUT# 10/18/2014 3.5  1.5 - 6.5 10e3/uL Final  . HGB 10/18/2014 10.0* 11.6 - 15.9 g/dL Final  . HCT 10/18/2014 30.6* 34.8 - 46.6 % Final  . Platelets 10/18/2014 162  145 - 400 10e3/uL Final  . MCV 10/18/2014 80.8  79.5 - 101.0 fL Final  . MCH 10/18/2014 26.4  25.1 - 34.0 pg Final  . MCHC 10/18/2014 32.8  31.5 - 36.0 g/dL Final  . RBC 10/18/2014 3.80  3.70 - 5.45 10e6/uL Final  . RDW 10/18/2014 16.1* 11.2 - 14.5 % Final  . lymph# 10/18/2014 0.3* 0.9 - 3.3 10e3/uL Final  . MONO# 10/18/2014 0.3  0.1 - 0.9 10e3/uL Final  . Eosinophils Absolute 10/18/2014 0.2  0.0 - 0.5 10e3/uL Final  . Basophils Absolute 10/18/2014 0.0  0.0 - 0.1 10e3/uL Final  . NEUT% 10/18/2014 81.3* 38.4 - 76.8 % Final  . LYMPH% 10/18/2014 6.5* 14.0 - 49.7 % Final  . MONO% 10/18/2014 7.1  0.0 - 14.0 % Final  . EOS% 10/18/2014 4.7  0.0 - 7.0 % Final  . BASO% 10/18/2014 0.4  0.0 - 2.0 % Final  . Sodium 10/18/2014 136  136 - 145 mEq/L Final  . Potassium 10/18/2014 4.0  3.5 - 5.1 mEq/L Final  . Chloride 10/18/2014 100  98 - 109 mEq/L Final  . CO2 10/18/2014 25  22 - 29 mEq/L Final  . Glucose 10/18/2014 114  70 - 140 mg/dl Final  . BUN 10/18/2014 14.0  7.0 - 26.0 mg/dL Final  . Creatinine 10/18/2014 0.7  0.6 - 1.1 mg/dL Final  . Total Bilirubin 10/18/2014 1.22* 0.20 - 1.20 mg/dL Final  . Alkaline Phosphatase 10/18/2014 84  40 - 150 U/L Final  . AST 10/18/2014 15  5 - 34 U/L Final  . ALT 10/18/2014 15  0 - 55 U/L Final  .  Total Protein 10/18/2014 6.5  6.4 - 8.3 g/dL Final  . Albumin 10/18/2014 3.2* 3.5 - 5.0 g/dL Final  . Calcium 10/18/2014 9.3  8.4 - 10.4 mg/dL Final  . Anion Gap 10/18/2014 11  3 - 11 mEq/L Final  . EGFR 10/18/2014 87* >90 ml/min/1.73 m2 Final   eGFR is calculated using the CKD-EPI Creatinine Equation (2009)     RADIOGRAPHIC STUDIES: No results found.  ASSESSMENT/PLAN:    Adenocarcinoma of lung Patient initiated Gilotrif oral therapy on 08/29/2014 at 40 mg per day.  She decreased the dose of the Gilotrif on 10/03/14 due to poor tolerability.  She continued to complain of significant diarrhea and mucositis; and was advised to decrease the Gilotrif to every other day.   Patient informed this provider during brief exam today in the infusion area that she has actually not returned to taking the [place] on an every other day basis as instructed.  She states that she is now willing to undergo both oral chemotherapy and radiation treatments at the same time; due to very poor tolerability.  She reports that she DID NOT give/obtain a stool sample to rule out C diff; since her diarrhea resolved.   Patient completed her radiation therapy to her left neck on 09/28/2014.  She is currently undergoing radiation treatments to her hips for known bone metastasis.  Patient is scheduled to return for daily radiation treatments as planned.  Patient is requesting to receive IV fluid rehydration this coming Friday, 10/21/2014.  She will obtain labs and a restaging CT of her chest on 11/09/2014.  She will return for follow-up visit to review scan results on 11/17/2014.       The plan is for the patient to return both Wednesday, 09/28/2014 and Friday, 09/30/2014 for IV fluid rehydration only.  Patient will return next Monday, 10/03/2014 for labs and follow up visit to see if she has recovered enough to restart her Gilotrif.   Advised patient the plan would be to decrease the Gilotrif down to 30 mg per day  when she restarts the medication in hopes it will be better tolerated.         Anorexia Patient complaining of minimal appetite; although both her diarrhea and her mucositis have greatly improved..  Patient was dehydrated today.  Patient was encouraged to eat multiple small a day; and to push fluids is much as possible.      Dehydration Patient complaining of minimal appetite; although both her diarrhea and her mucositis have greatly improved..  Patient appears dehydrated today.  Patient was encouraged to eat multiple small a day; and to push fluids as much as possible. Patient will receive IV fluid rehydration today while cancer Center.         Diarrhea Patient initiated Gilotrif oral therapy on 08/29/2014 at 40 mg per day.  She decreased the dose of the Gilotrif on 10/03/14 due to poor tolerability.  She continued to complain of significant diarrhea and mucositis; and was advised to decrease the Gilotrif to every other day.   Patient states that all of her diarrhea has since resolved.  She reports that she did not obtain a stool sample for C. difficile since her diarrhea did resolve.    Hyperbilirubinemia Bilirubin has improved slightly from 1.26 down to 1.22.  Most likely the elevated bilirubin is secondary to her oral chemotherapy.  We'll continue to monitor closely.   Patient stated understanding of all instructions; and was in agreement with this plan of care. The patient knows to call the clinic with any problems, questions or concerns.   All details of today's visit will be reviewed with Dr. Julien Nordmann.   Total time spent with patient was 15 minutes;  with greater than 75 percent of that time spent in face to face counseling regarding patient's symptoms,  and coordination of care and follow up.  Disclaimer: This note was dictated with voice recognition software. Similar sounding words can inadvertently be transcribed and may not be corrected upon review.   Drue Second, NP 10/20/2014

## 2014-10-20 NOTE — Telephone Encounter (Signed)
s.w. pt and advised on all JULY appts....pt ok and aware

## 2014-10-20 NOTE — Telephone Encounter (Signed)
Pt called with concern for fluids on Friday, does she need to see MD sooner than 3 weeks from last appt. Pt advised sh is not taking her Gilotrif 6/29 as it caused diarrhea and does not want to restart until radiation is completed next week. Reviewed pt concern with MD. Instructed pt, she will need a CT scan prior to MD appt and she should continue taking Gilotrif every other day. It should not affect radiation, pt needs to take Gilotrif as prescribed in order to know if medication is working properly. Pt again stated she did not want to take it until after radiation, verbalized understanding of above instructions. No further concerns. POF to scheduling

## 2014-10-20 NOTE — Assessment & Plan Note (Signed)
Patient complaining of minimal appetite; although both her diarrhea and her mucositis have greatly improved..  Patient appears dehydrated today.  Patient was encouraged to eat multiple small a day; and to push fluids as much as possible. Patient will receive IV fluid rehydration today while cancer Center.

## 2014-10-20 NOTE — Assessment & Plan Note (Signed)
Bilirubin has improved slightly from 1.26 down to 1.22.  Most likely the elevated bilirubin is secondary to her oral chemotherapy.  We'll continue to monitor closely.

## 2014-10-20 NOTE — Assessment & Plan Note (Signed)
Patient initiated Gilotrif oral therapy on 08/29/2014 at 40 mg per day.  She decreased the dose of the Gilotrif on 10/03/14 due to poor tolerability.  She continued to complain of significant diarrhea and mucositis; and was advised to decrease the Gilotrif to every other day.   Patient informed this provider during brief exam today in the infusion area that she has actually not returned to taking the [place] on an every other day basis as instructed.  She states that she is now willing to undergo both oral chemotherapy and radiation treatments at the same time; due to very poor tolerability.  She reports that she DID NOT give/obtain a stool sample to rule out C diff; since her diarrhea resolved.   Patient completed her radiation therapy to her left neck on 09/28/2014.  She is currently undergoing radiation treatments to her hips for known bone metastasis.   Patient is scheduled to return for daily radiation treatments as planned.  Patient is requesting to receive IV fluid rehydration this coming Friday, 10/21/2014.  She will obtain labs and a restaging CT of her chest on 11/09/2014.  She will return for follow-up visit to review scan results on 11/17/2014.       The plan is for the patient to return both Wednesday, 09/28/2014 and Friday, 09/30/2014 for IV fluid rehydration only.  Patient will return next Monday, 10/03/2014 for labs and follow up visit to see if she has recovered enough to restart her Gilotrif.   Advised patient the plan would be to decrease the Gilotrif down to 30 mg per day when she restarts the medication in hopes it will be better tolerated.

## 2014-10-21 ENCOUNTER — Ambulatory Visit
Admission: RE | Admit: 2014-10-21 | Discharge: 2014-10-21 | Disposition: A | Discharge status: Home / Self Care | Payer: BC Managed Care – PPO | Source: Ambulatory Visit | Attending: Radiation Oncology | Admitting: Radiation Oncology

## 2014-10-21 ENCOUNTER — Ambulatory Visit: Payer: BC Managed Care – PPO

## 2014-10-21 ENCOUNTER — Encounter: Payer: Self-pay | Admitting: Nurse Practitioner

## 2014-10-21 ENCOUNTER — Ambulatory Visit (HOSPITAL_BASED_OUTPATIENT_CLINIC_OR_DEPARTMENT_OTHER): Payer: BC Managed Care – PPO | Admitting: Nurse Practitioner

## 2014-10-21 ENCOUNTER — Other Ambulatory Visit: Payer: Self-pay | Admitting: *Deleted

## 2014-10-21 DIAGNOSIS — C3490 Malignant neoplasm of unspecified part of unspecified bronchus or lung: Secondary | ICD-10-CM

## 2014-10-21 DIAGNOSIS — C7931 Secondary malignant neoplasm of brain: Secondary | ICD-10-CM

## 2014-10-21 DIAGNOSIS — C7951 Secondary malignant neoplasm of bone: Secondary | ICD-10-CM | POA: Diagnosis not present

## 2014-10-21 DIAGNOSIS — Z95828 Presence of other vascular implants and grafts: Secondary | ICD-10-CM

## 2014-10-21 DIAGNOSIS — C3432 Malignant neoplasm of lower lobe, left bronchus or lung: Secondary | ICD-10-CM | POA: Diagnosis not present

## 2014-10-21 DIAGNOSIS — E860 Dehydration: Secondary | ICD-10-CM | POA: Diagnosis not present

## 2014-10-21 DIAGNOSIS — R197 Diarrhea, unspecified: Secondary | ICD-10-CM

## 2014-10-21 MED ORDER — HEPARIN SOD (PORK) LOCK FLUSH 100 UNIT/ML IV SOLN
500.0000 [IU] | Freq: Once | INTRAVENOUS | Status: AC
  Administered 2014-10-21: 500 [IU] via INTRAVENOUS
  Filled 2014-10-21: qty 5

## 2014-10-21 MED ORDER — SODIUM CHLORIDE 0.9 % IV SOLN
Freq: Once | INTRAVENOUS | Status: AC
  Administered 2014-10-21: 12:00:00 via INTRAVENOUS

## 2014-10-21 MED ORDER — SODIUM CHLORIDE 0.9 % IJ SOLN
10.0000 mL | INTRAMUSCULAR | Status: DC | PRN
  Administered 2014-10-21 (×2): 10 mL via INTRAVENOUS
  Filled 2014-10-21: qty 10

## 2014-10-21 MED ORDER — FENTANYL 50 MCG/HR TD PT72: 50.0000 ug | MEDICATED_PATCH | TRANSDERMAL | Status: DC

## 2014-10-21 NOTE — Progress Notes (Signed)
SYMPTOM MANAGEMENT CLINIC   HPI: Sherri Stafford 64 y.o. 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  Currently undergoing oral Gilotrif chemotherapy and palliative radiation therapy to her hips.  Patient returns to the [place] today with continued complaint of dehydration.  She is happy to report that her diarrhea has essentially resolved since decreasing the oral Gilotrif to every other day her mucositis issues have greatly improved as well.  She denies any new symptoms whatsoever.  She denies any recent fevers or chills.   Diarrhea     Review of Systems  Gastrointestinal: Positive for diarrhea.    Past Medical History  Diagnosis Date  . Arthritis   . Bilateral ovarian cysts   . GERD (gastroesophageal reflux disease)   . Strain of hip flexor 06/2013    left side torn  . Radiation     at [facility] to left hip, sacrum and brain  . Radiation 02/10/14-03/06/14    left central chest 35 gray  . Cancer     lung ca  . Bone metastases     to left hip and spine  . Encounter for antineoplastic chemotherapy 09/19/2014    Past Surgical History  Procedure Laterality Date  . Dilation and curettage of uterus  2004    x 3   . Tubal ligation    . Colonoscopy  04/05/2004    normal     has Nonspecific abnormal results of liver function study; Cough; Sarcoidosis; Unspecified vitamin D deficiency; Dyspnea; Adenocarcinoma of lung; Chest pain; Pulmonary embolism; Acute bronchitis; Bone metastases; Abdominal pain; Nausea without vomiting; Long term current use of anticoagulant therapy; Dysuria; Dehydration; Hyperbilirubinemia; Hypoalbuminemia; Neoplasm related pain; Anemia in neoplastic disease; Itching; Constipation; Encounter for antineoplastic chemotherapy; Mucositis due to chemotherapy; Diarrhea; and Anorexia on her problem list.    is allergic to other; ketoconazole; lorazepam; morphine and related; nystatin; sporanox; tylenol; vitamin d; and  zofran.    Medication List       This list is accurate as of: 10/21/14  7:52 PM.  Always use your most recent med list.               afatinib dimaleate 30 MG tablet  Commonly known as:  GILOTRIF  Take 1 tablet (30 mg total) by mouth daily. Take on an empty stomach 1hr before or 2 hrs after meals.     albuterol 108 (90 BASE) MCG/ACT inhaler  Commonly known as:  PROVENTIL HFA;VENTOLIN HFA  Inhale 1-2 puffs into the lungs every 6 (six) hours as needed for wheezing or shortness of breath.     bisacodyl 10 MG suppository  Commonly known as:  DULCOLAX  Place 1 suppository (10 mg total) rectally daily as needed for moderate constipation.     calcium carbonate 1250 (500 CA) MG chewable tablet  Commonly known as:  OS-CAL  Chew 4 tablets by mouth daily.     clindamycin 1 % external solution  Commonly known as:  CLEOCIN-T  Apply topically 2 (two) times daily.     codeine 30 MG tablet  Take 1 tablet (30 mg total) by mouth every 4 (four) hours as needed (cough).     [employer] SENNA PLUS 8.6-50 MG per tablet  Generic drug:  senna-docusate     diphenhydrAMINE 25 MG tablet  Commonly known as:  SOMINEX  Take 25 mg by mouth at bedtime as needed for sleep. Takes 2 tablets every 3-4 hours during the night.  diphenoxylate-atropine 2.5-0.025 MG per tablet  Commonly known as:  LOMOTIL  Take 2 tablets by mouth 4 (four) times daily as needed for diarrhea or loose stools.     docusate sodium 100 MG capsule  Commonly known as:  COLACE  Take 100 mg by mouth 2 (two) times daily.     dronabinol 2.5 MG capsule  Commonly known as:  MARINOL  Take 1 capsule (2.5 mg total) by mouth 2 (two) times daily before a meal.     esomeprazole 40 MG capsule  Commonly known as:  NEXIUM  Take 1 capsule (40 mg total) by mouth 2 (two) times daily before a meal.     fentaNYL 50 MCG/HR  Commonly known as:  DURAGESIC - dosed mcg/hr  Place 1 patch (50 mcg total) onto the skin every 3 (three) days.     glycerin  adult 2 G Supp  Place 1 suppository rectally once as needed for moderate constipation.     magic mouthwash w/lidocaine Soln  Take 5 mLs by mouth 4 (four) times daily as needed for mouth pain.     naproxen sodium 220 MG tablet  Commonly known as:  ANAPROX  Take 220 mg by mouth 2 (two) times daily with a meal.     polyethylene glycol packet  Commonly known as:  MIRALAX / GLYCOLAX  Take 17 g by mouth daily as needed for moderate constipation.     prochlorperazine 10 MG tablet  Commonly known as:  COMPAZINE  Take 1 tablet (10 mg total) by mouth every 6 (six) hours as needed for nausea or vomiting.     rivaroxaban 20 MG Tabs tablet  Commonly known as:  XARELTO  Take 1 tablet (20 mg total) by mouth daily with supper.     sucralfate 1 GM/10ML suspension  Commonly known as:  CARAFATE  Take 10 mLs (1 g total) by mouth 4 (four) times daily -  with meals and at bedtime.     traMADol 50 MG tablet  Commonly known as:  ULTRAM  TAKE 1 TABLET BY MOUTH EVERY 6 HOURS AS NEEDED         PHYSICAL EXAMINATION  Oncology Vitals 10/18/2014 10/18/2014 10/18/2014 10/14/2014 10/14/2014 10/11/2014 10/03/2014  Height - - - - - - -  Weight - - 52.572 kg - - 52.209 kg 53.751 kg  Weight (lbs) - - 115 lbs 14 oz - - 115 lbs 2 oz 118 lbs 8 oz  BMI (kg/m2) - - - - - - -  Temp - 98.2 97.8 - 97.9 97.7 98.5  Pulse 86 91 93 90 112 111 104  Resp - 18 16 - - 18 20  SpO2 - 99 100 - 100 100 99  BSA (m2) - - - - - - -   BP Readings from Last 3 Encounters:  10/18/14 115/61  10/18/14 101/58  10/14/14 102/61    Physical Exam  Constitutional: She is oriented to person, place, and time.  Patient appears fatigued, weak, frail, and chronically ill.  HENT:  Head: Normocephalic and atraumatic.  Mild erythema to time and lower gum region; just very tender.  Eyes: Conjunctivae and EOM are normal. Pupils are equal, round, and reactive to light. Right eye exhibits no discharge. Left eye exhibits no discharge. No scleral icterus.   Neck: Normal range of motion.  Pulmonary/Chest: Effort normal and breath sounds normal. No stridor. No respiratory distress.  Musculoskeletal: Normal range of motion.  Neurological: She is alert and oriented to person,  place, and time.  Patient in wheelchair during exam.  Skin: There is pallor.  Psychiatric: Affect normal.  Nursing note and vitals reviewed.   LABORATORY DATA:. No visits with results within 3 Day(s) from this visit. Latest known visit with results is:  Appointment on 10/18/2014  Component Date Value Ref Range Status  . WBC 10/18/2014 4.3  3.9 - 10.3 10e3/uL Final  . NEUT# 10/18/2014 3.5  1.5 - 6.5 10e3/uL Final  . HGB 10/18/2014 10.0* 11.6 - 15.9 g/dL Final  . HCT 10/18/2014 30.6* 34.8 - 46.6 % Final  . Platelets 10/18/2014 162  145 - 400 10e3/uL Final  . MCV 10/18/2014 80.8  79.5 - 101.0 fL Final  . MCH 10/18/2014 26.4  25.1 - 34.0 pg Final  . MCHC 10/18/2014 32.8  31.5 - 36.0 g/dL Final  . RBC 10/18/2014 3.80  3.70 - 5.45 10e6/uL Final  . RDW 10/18/2014 16.1* 11.2 - 14.5 % Final  . lymph# 10/18/2014 0.3* 0.9 - 3.3 10e3/uL Final  . MONO# 10/18/2014 0.3  0.1 - 0.9 10e3/uL Final  . Eosinophils Absolute 10/18/2014 0.2  0.0 - 0.5 10e3/uL Final  . Basophils Absolute 10/18/2014 0.0  0.0 - 0.1 10e3/uL Final  . NEUT% 10/18/2014 81.3* 38.4 - 76.8 % Final  . LYMPH% 10/18/2014 6.5* 14.0 - 49.7 % Final  . MONO% 10/18/2014 7.1  0.0 - 14.0 % Final  . EOS% 10/18/2014 4.7  0.0 - 7.0 % Final  . BASO% 10/18/2014 0.4  0.0 - 2.0 % Final  . Sodium 10/18/2014 136  136 - 145 mEq/L Final  . Potassium 10/18/2014 4.0  3.5 - 5.1 mEq/L Final  . Chloride 10/18/2014 100  98 - 109 mEq/L Final  . CO2 10/18/2014 25  22 - 29 mEq/L Final  . Glucose 10/18/2014 114  70 - 140 mg/dl Final  . BUN 10/18/2014 14.0  7.0 - 26.0 mg/dL Final  . Creatinine 10/18/2014 0.7  0.6 - 1.1 mg/dL Final  . Total Bilirubin 10/18/2014 1.22* 0.20 - 1.20 mg/dL Final  . Alkaline Phosphatase 10/18/2014 84  40 - 150  U/L Final  . AST 10/18/2014 15  5 - 34 U/L Final  . ALT 10/18/2014 15  0 - 55 U/L Final  . Total Protein 10/18/2014 6.5  6.4 - 8.3 g/dL Final  . Albumin 10/18/2014 3.2* 3.5 - 5.0 g/dL Final  . Calcium 10/18/2014 9.3  8.4 - 10.4 mg/dL Final  . Anion Gap 10/18/2014 11  3 - 11 mEq/L Final  . EGFR 10/18/2014 87* >90 ml/min/1.73 m2 Final   eGFR is calculated using the CKD-EPI Creatinine Equation (2009)     RADIOGRAPHIC STUDIES: No results found.  ASSESSMENT/PLAN:    Adenocarcinoma of lung Patient initiated Gilotrif oral therapy on 08/29/2014 at 40 mg per day.  She decreased the dose of the Gilotrif on 10/03/14 due to poor tolerability.  She continued to complain of significant diarrhea and mucositis; and was advised to decrease the Gilotrif to every other day.   Patient informed this provider during brief exam today in the infusion area that she has actually not returned to taking the [place] on an every other day basis as instructed; but is in agreement to begin taking the gilotrif as directed again.   She reports that she DID NOT give/obtain a stool sample to rule out C diff; since her diarrhea resolved.   Patient completed her radiation therapy to her left neck on 09/28/2014.  She is currently undergoing radiation treatments to her hips  for known bone metastasis.   Patient is scheduled to return for daily radiation treatments as planned.  She will obtain labs and a restaging CT of her chest on 11/09/2014.  She will return for follow-up visit to review scan results on 11/17/2014.             Dehydration Patient complaining of minimal appetite; although both her diarrhea and her mucositis have greatly improved..  Patient appears dehydrated today.  Patient was encouraged to eat multiple small a day; and to push fluids as much as possible. Patient will receive IV fluid rehydration today while cancer Center.           Diarrhea Patient initiated Gilotrif oral therapy on  08/29/2014 at 40 mg per day.  She decreased the dose of the Gilotrif on 10/03/14 due to poor tolerability.  She continued to complain of significant diarrhea and mucositis; and was advised to decrease the Gilotrif to every other day.   Patient states that all of her diarrhea has since resolved.  She reports that she did not obtain a stool sample for C. difficile since her diarrhea did resolve.       Patient stated understanding of all instructions; and was in agreement with this plan of care. The patient knows to call the clinic with any problems, questions or concerns.   All details of today's visit will be reviewed with Dr. Julien Nordmann.   Total time spent with patient was 15 minutes;  with greater than 75 percent of that time spent in face to face counseling regarding patient's symptoms,  and coordination of care and follow up.  Disclaimer: This note was dictated with voice recognition software. Similar sounding words can inadvertently be transcribed and may not be corrected upon review.   Drue Second, NP 10/21/2014

## 2014-10-21 NOTE — Assessment & Plan Note (Signed)
Patient initiated Gilotrif oral therapy on 08/29/2014 at 40 mg per day.  She decreased the dose of the Gilotrif on 10/03/14 due to poor tolerability.  She continued to complain of significant diarrhea and mucositis; and was advised to decrease the Gilotrif to every other day.   Patient informed this provider during brief exam today in the infusion area that she has actually not returned to taking the [place] on an every other day basis as instructed; but is in agreement to begin taking the gilotrif as directed again.   She reports that she DID NOT give/obtain a stool sample to rule out C diff; since her diarrhea resolved.   Patient completed her radiation therapy to her left neck on 09/28/2014.  She is currently undergoing radiation treatments to her hips for known bone metastasis.   Patient is scheduled to return for daily radiation treatments as planned.  She will obtain labs and a restaging CT of her chest on 11/09/2014.  She will return for follow-up visit to review scan results on 11/17/2014.

## 2014-10-21 NOTE — Assessment & Plan Note (Signed)
Patient initiated Gilotrif oral therapy on 08/29/2014 at 40 mg per day.  She decreased the dose of the Gilotrif on 10/03/14 due to poor tolerability.  She continued to complain of significant diarrhea and mucositis; and was advised to decrease the Gilotrif to every other day.   Patient states that all of her diarrhea has since resolved.  She reports that she did not obtain a stool sample for C. difficile since her diarrhea did resolve.

## 2014-10-21 NOTE — Assessment & Plan Note (Signed)
Patient complaining of minimal appetite; although both her diarrhea and her mucositis have greatly improved..  Patient appears dehydrated today.  Patient was encouraged to eat multiple small a day; and to push fluids as much as possible. Patient will receive IV fluid rehydration today while cancer Center.

## 2014-10-22 ENCOUNTER — Ambulatory Visit: Payer: BC Managed Care – PPO

## 2014-10-24 ENCOUNTER — Ambulatory Visit
Admission: RE | Admit: 2014-10-24 | Discharge: 2014-10-24 | Disposition: A | Discharge status: Home / Self Care | Payer: BC Managed Care – PPO | Source: Ambulatory Visit | Attending: Radiation Oncology | Admitting: Radiation Oncology

## 2014-10-24 ENCOUNTER — Ambulatory Visit: Payer: BC Managed Care – PPO

## 2014-10-24 DIAGNOSIS — C3490 Malignant neoplasm of unspecified part of unspecified bronchus or lung: Secondary | ICD-10-CM | POA: Diagnosis not present

## 2014-10-25 ENCOUNTER — Ambulatory Visit: Payer: BC Managed Care – PPO

## 2014-10-25 ENCOUNTER — Ambulatory Visit
Admission: RE | Admit: 2014-10-25 | Discharge: 2014-10-25 | Disposition: A | Discharge status: Home / Self Care | Payer: BC Managed Care – PPO | Source: Ambulatory Visit | Attending: Radiation Oncology | Admitting: Radiation Oncology

## 2014-10-25 ENCOUNTER — Encounter: Payer: Self-pay | Admitting: Radiation Oncology

## 2014-10-25 VITALS — BP 95/66 | HR 99 | Resp 16 | Wt 112.9 lb

## 2014-10-25 DIAGNOSIS — C3490 Malignant neoplasm of unspecified part of unspecified bronchus or lung: Secondary | ICD-10-CM | POA: Diagnosis not present

## 2014-10-25 NOTE — Progress Notes (Addendum)
Weight and vitals stable. Reports low back pain 2 on scale of 0-10. Reports weakness. Patient riding in wheelchair today due to weakness. Denies any difficulty ambulating independently. Denies numbness or tingling of either lower extremity. Reports control of both bowel and bladder. Reports fentanyl works well to manage pain. Reports intermittent nausea and vomiting. Reports using Compazine. Reports poor po intake. Completes radiation tomorrow. One month follow up appointment card given.   BP 95/66 mmHg  Pulse 99  Resp 16  Wt 112 lb 14.4 oz (51.211 kg)  LMP 01/14/2008 Wt Readings from Last 3 Encounters:  10/25/14 112 lb 14.4 oz (51.211 kg)  10/18/14 115 lb 14.4 oz (52.572 kg)  10/11/14 115 lb 1.6 oz (52.209 kg)

## 2014-10-25 NOTE — Progress Notes (Signed)
  Radiation Oncology         [phone] ________________________________  Name: Sherri Stafford MRN: [id number]  Date: 10/25/2014  DOB: [birth year]  Weekly Radiation Therapy Management  DIAGNOSIS: stage IV (T2a, N3, M1b) non-small cell lung cancer   Current Dose: 18 Gy     Planned Dose:  20 Gy  Narrative . . . . . . . . The patient presents for routine under treatment assessment.                                   The patient continues to have problems with nausea and emesis after her radiation therapy. She takes Compazine twice daily for this issue. I offered Zofran but patient only has one more treatment she does not wish to start a new medication. She will be seen in medical oncology with consideration for additional iv fluid supplementation. Overall her pain is improved.                                 Set-up films were reviewed.                                 The chart was checked. Physical Findings. . .  weight is 112 lb 14.4 oz (51.211 kg). Her blood pressure is 95/66 and her pulse is 99. Her respiration is 16. . Weight essentially stable.  No significant changes. Impression . . . . . . . The patient is tolerating radiation. Plan . . . . . . . . . . . . Continue treatment as planned.  ________________________________   Blair Promise, PhD, MD

## 2014-10-26 ENCOUNTER — Ambulatory Visit
Admission: RE | Admit: 2014-10-26 | Discharge: 2014-10-26 | Disposition: A | Discharge status: Home / Self Care | Payer: BC Managed Care – PPO | Source: Ambulatory Visit | Attending: Radiation Oncology | Admitting: Radiation Oncology

## 2014-10-26 ENCOUNTER — Ambulatory Visit (HOSPITAL_BASED_OUTPATIENT_CLINIC_OR_DEPARTMENT_OTHER): Payer: BC Managed Care – PPO

## 2014-10-26 ENCOUNTER — Encounter: Payer: Self-pay | Admitting: Radiation Oncology

## 2014-10-26 ENCOUNTER — Telehealth: Payer: Self-pay | Admitting: *Deleted

## 2014-10-26 ENCOUNTER — Telehealth: Payer: Self-pay | Admitting: Internal Medicine

## 2014-10-26 ENCOUNTER — Other Ambulatory Visit: Payer: Self-pay | Admitting: Medical Oncology

## 2014-10-26 ENCOUNTER — Ambulatory Visit: Payer: BC Managed Care – PPO

## 2014-10-26 VITALS — BP 115/65 | HR 86 | Temp 98.0°F | Resp 16

## 2014-10-26 DIAGNOSIS — C3490 Malignant neoplasm of unspecified part of unspecified bronchus or lung: Secondary | ICD-10-CM | POA: Diagnosis not present

## 2014-10-26 DIAGNOSIS — C7931 Secondary malignant neoplasm of brain: Secondary | ICD-10-CM

## 2014-10-26 DIAGNOSIS — C7951 Secondary malignant neoplasm of bone: Secondary | ICD-10-CM

## 2014-10-26 DIAGNOSIS — Z95828 Presence of other vascular implants and grafts: Secondary | ICD-10-CM

## 2014-10-26 DIAGNOSIS — R112 Nausea with vomiting, unspecified: Secondary | ICD-10-CM

## 2014-10-26 DIAGNOSIS — R5383 Other fatigue: Secondary | ICD-10-CM | POA: Diagnosis not present

## 2014-10-26 DIAGNOSIS — C3432 Malignant neoplasm of lower lobe, left bronchus or lung: Secondary | ICD-10-CM | POA: Diagnosis not present

## 2014-10-26 DIAGNOSIS — R110 Nausea: Secondary | ICD-10-CM

## 2014-10-26 MED ORDER — PROCHLORPERAZINE MALEATE 10 MG PO TABS
ORAL_TABLET | ORAL | Status: AC
  Filled 2014-10-26: qty 1

## 2014-10-26 MED ORDER — SODIUM CHLORIDE 0.9 % IV SOLN
INTRAVENOUS | Status: DC
  Administered 2014-10-26: 13:00:00 via INTRAVENOUS

## 2014-10-26 MED ORDER — PROCHLORPERAZINE MALEATE 10 MG PO TABS
10.0000 mg | ORAL_TABLET | ORAL | Status: AC
  Administered 2014-10-26: 10 mg via ORAL

## 2014-10-26 MED ORDER — SODIUM CHLORIDE 0.9 % IJ SOLN
10.0000 mL | INTRAMUSCULAR | Status: DC | PRN
  Administered 2014-10-26: 10 mL via INTRAVENOUS
  Filled 2014-10-26: qty 10

## 2014-10-26 MED ORDER — HEPARIN SOD (PORK) LOCK FLUSH 100 UNIT/ML IV SOLN
500.0000 [IU] | Freq: Once | INTRAVENOUS | Status: AC
  Administered 2014-10-26: 500 [IU] via INTRAVENOUS
  Filled 2014-10-26: qty 5

## 2014-10-26 NOTE — Patient Instructions (Signed)
Dehydration, Adult Dehydration is when you lose more fluids from the body than you take in. Vital organs like the kidneys, brain, and heart cannot function without a proper amount of fluids and salt. Any loss of fluids from the body can cause dehydration.  CAUSES   Vomiting.  Diarrhea.  Excessive sweating.  Excessive urine output.  Fever. SYMPTOMS  Mild dehydration  Thirst.  Dry lips.  Slightly dry mouth. Moderate dehydration  Very dry mouth.  Sunken eyes.  Skin does not bounce back quickly when lightly pinched and released.  Dark urine and decreased urine production.  Decreased tear production.  Headache. Severe dehydration  Very dry mouth.  Extreme thirst.  Rapid, weak pulse (more than 100 beats per minute at rest).  Cold hands and feet.  Not able to sweat in spite of heat and temperature.  Rapid breathing.  Blue lips.  Confusion and lethargy.  Difficulty being awakened.  Minimal urine production.  No tears. DIAGNOSIS  Your caregiver will diagnose dehydration based on your symptoms and your exam. Blood and urine tests will help confirm the diagnosis. The diagnostic evaluation should also identify the cause of dehydration. TREATMENT  Treatment of mild or moderate dehydration can often be done at home by increasing the amount of fluids that you drink. It is best to drink small amounts of fluid more often. Drinking too much at one time can make vomiting worse. Refer to the home care instructions below. Severe dehydration needs to be treated at the hospital where you will probably be given intravenous (IV) fluids that contain water and electrolytes. HOME CARE INSTRUCTIONS   Ask your caregiver about specific rehydration instructions.  Drink enough fluids to keep your urine clear or pale yellow.  Drink small amounts frequently if you have nausea and vomiting.  Eat as you normally do.  Avoid:  Foods or drinks high in sugar.  Carbonated  drinks.  Juice.  Extremely hot or cold fluids.  Drinks with caffeine.  Fatty, greasy foods.  Alcohol.  Tobacco.  Overeating.  Gelatin desserts.  Wash your hands well to avoid spreading bacteria and viruses.  Only take over-the-counter or prescription medicines for pain, discomfort, or fever as directed by your caregiver.  Ask your caregiver if you should continue all prescribed and over-the-counter medicines.  Keep all follow-up appointments with your caregiver. SEEK MEDICAL CARE IF:  You have abdominal pain and it increases or stays in one area (localizes).  You have a rash, stiff neck, or severe headache.  You are irritable, sleepy, or difficult to awaken.  You are weak, dizzy, or extremely thirsty. SEEK IMMEDIATE MEDICAL CARE IF:   You are unable to keep fluids down or you get worse despite treatment.  You have frequent episodes of vomiting or diarrhea.  You have blood or green matter (bile) in your vomit.  You have blood in your stool or your stool looks black and tarry.  You have not urinated in 6 to 8 hours, or you have only urinated a small amount of very dark urine.  You have a fever.  You faint. MAKE SURE YOU:   Understand these instructions.  Will watch your condition.  Will get help right away if you are not doing well or get worse. Document Released: 04/01/2005 Document Revised: 06/24/2011 Document Reviewed: 11/19/2010 ExitCare Patient Information 2015 [employer]. This information is not intended to replace advice given to you by your health care provider. Make sure you discuss any questions you have with your health care   provider.  

## 2014-10-26 NOTE — Progress Notes (Signed)
Per Julien Nordmann may give IVFof normal saline  over 1.5 hours today.

## 2014-10-26 NOTE — Telephone Encounter (Signed)
s.w. pt and advised on today ivf...pt ok and aware

## 2014-10-26 NOTE — Telephone Encounter (Signed)
Patient called in stating that she needs fluids after her radiation appointment. Hilario Quarry RN notified [employer]). POF sent to scheduler.

## 2014-10-31 ENCOUNTER — Telehealth: Payer: Self-pay

## 2014-10-31 ENCOUNTER — Encounter: Payer: Self-pay | Admitting: Nurse Practitioner

## 2014-10-31 ENCOUNTER — Other Ambulatory Visit: Payer: Self-pay | Admitting: Nurse Practitioner

## 2014-10-31 ENCOUNTER — Telehealth: Payer: Self-pay | Admitting: *Deleted

## 2014-10-31 ENCOUNTER — Telehealth: Payer: Self-pay | Admitting: Nurse Practitioner

## 2014-10-31 ENCOUNTER — Ambulatory Visit (HOSPITAL_BASED_OUTPATIENT_CLINIC_OR_DEPARTMENT_OTHER): Payer: BC Managed Care – PPO | Admitting: Nurse Practitioner

## 2014-10-31 ENCOUNTER — Other Ambulatory Visit (HOSPITAL_BASED_OUTPATIENT_CLINIC_OR_DEPARTMENT_OTHER): Payer: BC Managed Care – PPO

## 2014-10-31 VITALS — BP 99/52 | HR 92 | Temp 97.9°F | Wt 112.5 lb

## 2014-10-31 DIAGNOSIS — K1231 Oral mucositis (ulcerative) due to antineoplastic therapy: Secondary | ICD-10-CM

## 2014-10-31 DIAGNOSIS — C7951 Secondary malignant neoplasm of bone: Secondary | ICD-10-CM

## 2014-10-31 DIAGNOSIS — R197 Diarrhea, unspecified: Secondary | ICD-10-CM | POA: Diagnosis not present

## 2014-10-31 DIAGNOSIS — R110 Nausea: Secondary | ICD-10-CM | POA: Diagnosis not present

## 2014-10-31 DIAGNOSIS — C3432 Malignant neoplasm of lower lobe, left bronchus or lung: Secondary | ICD-10-CM

## 2014-10-31 DIAGNOSIS — E860 Dehydration: Secondary | ICD-10-CM

## 2014-10-31 DIAGNOSIS — C3490 Malignant neoplasm of unspecified part of unspecified bronchus or lung: Secondary | ICD-10-CM

## 2014-10-31 DIAGNOSIS — C7931 Secondary malignant neoplasm of brain: Secondary | ICD-10-CM

## 2014-10-31 LAB — COMPREHENSIVE METABOLIC PANEL (CC13)
ALT: 13 U/L (ref 0–55)
AST: 15 U/L (ref 5–34)
Albumin: 3.1 g/dL — ABNORMAL LOW (ref 3.5–5.0)
Alkaline Phosphatase: 73 U/L (ref 40–150)
Anion Gap: 7 mEq/L (ref 3–11)
BUN: 10.3 mg/dL (ref 7.0–26.0)
CHLORIDE: 101 meq/L (ref 98–109)
CO2: 27 mEq/L (ref 22–29)
CREATININE: 0.7 mg/dL (ref 0.6–1.1)
Calcium: 8.7 mg/dL (ref 8.4–10.4)
EGFR: 88 mL/min/{1.73_m2} — ABNORMAL LOW (ref >90)
Glucose: 107 mg/dl (ref 70–140)
Potassium: 4.2 mEq/L (ref 3.5–5.1)
Sodium: 134 mEq/L — ABNORMAL LOW (ref 136–145)
Total Bilirubin: 1.02 mg/dL (ref 0.20–1.20)
Total Protein: 6.3 g/dL — ABNORMAL LOW (ref 6.4–8.3)

## 2014-10-31 LAB — CBC WITH DIFFERENTIAL/PLATELET
BASO%: 0.3 % (ref 0.0–2.0)
Basophils Absolute: 0 10*3/uL (ref 0.0–0.1)
EOS%: 6 % (ref 0.0–7.0)
Eosinophils Absolute: 0.2 10*3/uL (ref 0.0–0.5)
HEMATOCRIT: 30.5 % — AB (ref 34.8–46.6)
HGB: 9.7 g/dL — ABNORMAL LOW (ref 11.6–15.9)
LYMPH#: 0.2 10*3/uL — AB (ref 0.9–3.3)
LYMPH%: 6.3 % — ABNORMAL LOW (ref 14.0–49.7)
MCH: 26.5 pg (ref 25.1–34.0)
MCHC: 31.8 g/dL (ref 31.5–36.0)
MCV: 83.3 fL (ref 79.5–101.0)
MONO#: 0.4 10*3/uL (ref 0.1–0.9)
MONO%: 11.5 % (ref 0.0–14.0)
NEUT#: 2.5 10*3/uL (ref 1.5–6.5)
NEUT%: 75.9 % (ref 38.4–76.8)
NRBC: 0 % (ref 0–0)
PLATELETS: 140 10*3/uL — AB (ref 145–400)
RBC: 3.66 10*6/uL — ABNORMAL LOW (ref 3.70–5.45)
RDW: 15.6 % — ABNORMAL HIGH (ref 11.2–14.5)
WBC: 3.3 10*3/uL — ABNORMAL LOW (ref 3.9–10.3)

## 2014-10-31 MED ORDER — SODIUM CHLORIDE 0.9 % IV SOLN
1000.0000 mL | Freq: Once | INTRAVENOUS | Status: AC
  Administered 2014-10-31: 1000 mL via INTRAVENOUS

## 2014-10-31 MED ORDER — HEPARIN SOD (PORK) LOCK FLUSH 100 UNIT/ML IV SOLN
500.0000 [IU] | Freq: Once | INTRAVENOUS | Status: AC
  Administered 2014-10-31: 500 [IU] via INTRAVENOUS
  Filled 2014-10-31: qty 5

## 2014-10-31 MED ORDER — SODIUM CHLORIDE 0.9 % IJ SOLN
10.0000 mL | INTRAMUSCULAR | Status: DC | PRN
  Administered 2014-10-31 (×2): 10 mL via INTRAVENOUS
  Filled 2014-10-31: qty 10

## 2014-10-31 NOTE — Telephone Encounter (Signed)
Sent msg to add IV Fluid hydration only for this Wed. 07/20 will call pt confirming time... KJ

## 2014-10-31 NOTE — Assessment & Plan Note (Signed)
Patient initiated Gilotrif oral therapy on 08/29/2014 at 40 mg per day.  She decreased the dose of the Gilotrif on 10/03/14 due to poor tolerability.  She continued to complain of significant diarrhea and mucositis; and was advised to decrease the Gilotrif to every other day.   Pt states she has been having approx 2 diarrhea episodes per day recently; and sheis managing the diarrhea with Imodium.

## 2014-10-31 NOTE — Assessment & Plan Note (Addendum)
Patient initiated Gilotrif oral therapy on 08/29/2014 at 40 mg per day.  She decreased the dose of the Gilotrif on 10/03/14 due to poor tolerability.  She continued to complain of significant diarrhea and mucositis; and was advised to decrease the Gilotrif to every other day.   Patient completed her radiation therapy to her left neck on 09/28/2014.  She  completed radiation treatments to her hips for known bone metastasis this past Wednesday, 10/26/2014.  She will obtain labs and a restaging CT of her chest on 11/09/2014.  She will return for follow-up visit to review scan results on 11/17/2014.

## 2014-10-31 NOTE — Assessment & Plan Note (Addendum)
Patient complaining of minimal appetite; although both her diarrhea and her mucositis have greatly improved..  Patient appears dehydrated today.  Patient was encouraged to eat multiple small a day; and to push fluids as much as possible. Patient will receive IV fluid rehydration today while cancer Center; and will also return per patient's request this coming Wednesday 11/02/14 for additional IV fluids.

## 2014-10-31 NOTE — Assessment & Plan Note (Signed)
Patient did experience one episode of nausea/vomiting yesterday.   She states she already has antiemetics at home to take on an as-needed basis for nausea.  She states her nausea has passed for the time being.

## 2014-10-31 NOTE — Telephone Encounter (Signed)
Returning pt call. She is feeling poorly and rundown. She is controlling diarrhea to 2 stools per day with immodium. Eating and drinking "as best I can" guessed 8-10 oz fluid yesterday. Is nauseated but getting some better. No fever. No pains. Requesting to see Selena Lesser. Lab and [facility] visit POF sent.

## 2014-10-31 NOTE — Progress Notes (Signed)
SYMPTOM MANAGEMENT CLINIC   HPI: Sherri Stafford 64 y.o. 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  Currently undergoing oral Gilotrif chemotherapy.  Pt completed palliative radiation to her hips last week.   Patient returns to the [facility] today with continued complaint of dehydration.  She is happy to report that her diarrhea has improved; and she is only having diarrhea 2 times per day since decreasing the oral Gilotrif to every other day. She denies any new symptoms whatsoever.  She denies any recent fevers or chills.   Diarrhea     Review of Systems  Gastrointestinal: Positive for diarrhea.    Past Medical History  Diagnosis Date  . Arthritis   . Bilateral ovarian cysts   . GERD (gastroesophageal reflux disease)   . Strain of hip flexor 06/2013    left side torn  . Radiation     at [facility] to left hip, sacrum and brain  . Radiation 02/10/14-03/06/14    left central chest 35 gray  . Cancer     lung ca  . Bone metastases     to left hip and spine  . Encounter for antineoplastic chemotherapy 09/19/2014    Past Surgical History  Procedure Laterality Date  . Dilation and curettage of uterus  2004    x 3   . Tubal ligation    . Colonoscopy  04/05/2004    normal     has Nonspecific abnormal results of liver function study; Cough; Sarcoidosis; Unspecified vitamin D deficiency; Dyspnea; Adenocarcinoma of lung; Chest pain; Pulmonary embolism; Acute bronchitis; Bone metastases; Abdominal pain; Nausea without vomiting; Long term current use of anticoagulant therapy; Dysuria; Dehydration; Hyperbilirubinemia; Hypoalbuminemia; Neoplasm related pain; Anemia in neoplastic disease; Itching; Constipation; Encounter for antineoplastic chemotherapy; Mucositis due to chemotherapy; Diarrhea; and Anorexia on her problem list.    is allergic to other; ketoconazole; lorazepam; morphine and related; nystatin; sporanox; tylenol; vitamin d; and zofran.    Medication List       This list is accurate as of: 10/31/14  8:20 PM.  Always use your most recent med list.               afatinib dimaleate 30 MG tablet  Commonly known as:  GILOTRIF  Take 1 tablet (30 mg total) by mouth daily. Take on an empty stomach 1hr before or 2 hrs after meals.     albuterol 108 (90 BASE) MCG/ACT inhaler  Commonly known as:  PROVENTIL HFA;VENTOLIN HFA  Inhale 1-2 puffs into the lungs every 6 (six) hours as needed for wheezing or shortness of breath.     bisacodyl 10 MG suppository  Commonly known as:  DULCOLAX  Place 1 suppository (10 mg total) rectally daily as needed for moderate constipation.     calcium carbonate 1250 (500 CA) MG chewable tablet  Commonly known as:  OS-CAL  Chew 4 tablets by mouth daily.     clindamycin 1 % external solution  Commonly known as:  CLEOCIN-T  Apply topically 2 (two) times daily.     [employer] SENNA PLUS 8.6-50 MG per tablet  Generic drug:  senna-docusate     diphenhydrAMINE 25 MG tablet  Commonly known as:  SOMINEX  Take 25 mg by mouth at bedtime as needed for sleep. Takes 2 tablets every 3-4 hours during the night.     diphenoxylate-atropine 2.5-0.025 MG per tablet  Commonly known as:  LOMOTIL  Take 2 tablets by mouth 4 (  four) times daily as needed for diarrhea or loose stools.     docusate sodium 100 MG capsule  Commonly known as:  COLACE  Take 100 mg by mouth 2 (two) times daily.     fentaNYL 50 MCG/HR  Commonly known as:  DURAGESIC - dosed mcg/hr  Place 1 patch (50 mcg total) onto the skin every 3 (three) days.     glycerin adult 2 G Supp  Place 1 suppository rectally once as needed for moderate constipation.     magic mouthwash w/lidocaine Soln  Take 5 mLs by mouth 4 (four) times daily as needed for mouth pain.     naproxen sodium 220 MG tablet  Commonly known as:  ANAPROX  Take 220 mg by mouth 2 (two) times daily with a meal.     polyethylene glycol packet  Commonly known as:  MIRALAX / GLYCOLAX    Take 17 g by mouth daily as needed for moderate constipation.     prochlorperazine 10 MG tablet  Commonly known as:  COMPAZINE  Take 1 tablet (10 mg total) by mouth every 6 (six) hours as needed for nausea or vomiting.     rivaroxaban 20 MG Tabs tablet  Commonly known as:  XARELTO  Take 1 tablet (20 mg total) by mouth daily with supper.     sucralfate 1 GM/10ML suspension  Commonly known as:  CARAFATE  Take 10 mLs (1 g total) by mouth 4 (four) times daily -  with meals and at bedtime.     traMADol 50 MG tablet  Commonly known as:  ULTRAM  TAKE 1 TABLET BY MOUTH EVERY 6 HOURS AS NEEDED         PHYSICAL EXAMINATION  Oncology Vitals 10/31/2014 10/26/2014 10/25/2014 10/18/2014 10/18/2014 10/18/2014 10/14/2014  Height - - - - - - -  Weight 51.03 kg - 51.211 kg - - 52.572 kg -  Weight (lbs) 112 lbs 8 oz - 112 lbs 14 oz - - 115 lbs 14 oz -  BMI (kg/m2) - - - - - - -  Temp 97.9 98 - - 98.2 97.8 -  Pulse 92 86 99 86 91 93 90  Resp - 16 16 - 18 16 -  SpO2 100 100 - - 99 100 -  BSA (m2) - - - - - - -   BP Readings from Last 3 Encounters:  10/31/14 99/52  10/26/14 115/65  10/25/14 95/66    Physical Exam  Constitutional: She is oriented to person, place, and time.  Patient appears fatigued, weak, frail, and chronically ill.  HENT:  Head: Normocephalic and atraumatic.  Mild erythema to time and lower gum region; just very tender.  Eyes: Conjunctivae and EOM are normal. Pupils are equal, round, and reactive to light. Right eye exhibits no discharge. Left eye exhibits no discharge. No scleral icterus.  Neck: Normal range of motion.  Pulmonary/Chest: Effort normal and breath sounds normal. No stridor. No respiratory distress.  Musculoskeletal: Normal range of motion.  Neurological: She is alert and oriented to person, place, and time.  Patient in wheelchair during exam.  Skin: There is pallor.  Psychiatric: Affect normal.  Nursing note and vitals reviewed.   LABORATORY  DATA:. Appointment on 10/31/2014  Component Date Value Ref Range Status  . WBC 10/31/2014 3.3* 3.9 - 10.3 10e3/uL Final  . NEUT# 10/31/2014 2.5  1.5 - 6.5 10e3/uL Final  . HGB 10/31/2014 9.7* 11.6 - 15.9 g/dL Final  . HCT 10/31/2014 30.5* 34.8 - 46.6 %  Final  . Platelets 10/31/2014 140* 145 - 400 10e3/uL Final  . MCV 10/31/2014 83.3  79.5 - 101.0 fL Final  . MCH 10/31/2014 26.5  25.1 - 34.0 pg Final  . MCHC 10/31/2014 31.8  31.5 - 36.0 g/dL Final  . RBC 10/31/2014 3.66* 3.70 - 5.45 10e6/uL Final  . RDW 10/31/2014 15.6* 11.2 - 14.5 % Final  . lymph# 10/31/2014 0.2* 0.9 - 3.3 10e3/uL Final  . MONO# 10/31/2014 0.4  0.1 - 0.9 10e3/uL Final  . Eosinophils Absolute 10/31/2014 0.2  0.0 - 0.5 10e3/uL Final  . Basophils Absolute 10/31/2014 0.0  0.0 - 0.1 10e3/uL Final  . NEUT% 10/31/2014 75.9  38.4 - 76.8 % Final  . LYMPH% 10/31/2014 6.3* 14.0 - 49.7 % Final  . MONO% 10/31/2014 11.5  0.0 - 14.0 % Final  . EOS% 10/31/2014 6.0  0.0 - 7.0 % Final  . BASO% 10/31/2014 0.3  0.0 - 2.0 % Final  . nRBC 10/31/2014 0  0 - 0 % Final  . Sodium 10/31/2014 134* 136 - 145 mEq/L Final  . Potassium 10/31/2014 4.2  3.5 - 5.1 mEq/L Final  . Chloride 10/31/2014 101  98 - 109 mEq/L Final  . CO2 10/31/2014 27  22 - 29 mEq/L Final  . Glucose 10/31/2014 107  70 - 140 mg/dl Final  . BUN 10/31/2014 10.3  7.0 - 26.0 mg/dL Final  . Creatinine 10/31/2014 0.7  0.6 - 1.1 mg/dL Final  . Total Bilirubin 10/31/2014 1.02  0.20 - 1.20 mg/dL Final  . Alkaline Phosphatase 10/31/2014 73  40 - 150 U/L Final  . AST 10/31/2014 15  5 - 34 U/L Final  . ALT 10/31/2014 13  0 - 55 U/L Final  . Total Protein 10/31/2014 6.3* 6.4 - 8.3 g/dL Final  . Albumin 10/31/2014 3.1* 3.5 - 5.0 g/dL Final  . Calcium 10/31/2014 8.7  8.4 - 10.4 mg/dL Final  . Anion Gap 10/31/2014 7  3 - 11 mEq/L Final  . EGFR 10/31/2014 88* >90 ml/min/1.73 m2 Final   eGFR is calculated using the CKD-EPI Creatinine Equation (2009)     RADIOGRAPHIC STUDIES: No  results found.  ASSESSMENT/PLAN:    Adenocarcinoma of lung Patient initiated Gilotrif oral therapy on 08/29/2014 at 40 mg per day.  She decreased the dose of the Gilotrif on 10/03/14 due to poor tolerability.  She continued to complain of significant diarrhea and mucositis; and was advised to decrease the Gilotrif to every other day.   Patient completed her radiation therapy to her left neck on 09/28/2014.  She  completed radiation treatments to her hips for known bone metastasis this past Wednesday, 10/26/2014.  She will obtain labs and a restaging CT of her chest on 11/09/2014.  She will return for follow-up visit to review scan results on 11/17/2014.               Dehydration Patient complaining of minimal appetite; although both her diarrhea and her mucositis have greatly improved..  Patient appears dehydrated today.  Patient was encouraged to eat multiple small a day; and to push fluids as much as possible. Patient will receive IV fluid rehydration today while cancer Center; and will also return per patient's request this coming Wednesday 11/02/14 for additional IV fluids.        Diarrhea Patient initiated Gilotrif oral therapy on 08/29/2014 at 40 mg per day.  She decreased the dose of the Gilotrif on 10/03/14 due to poor tolerability.  She continued to complain of  significant diarrhea and mucositis; and was advised to decrease the Gilotrif to every other day.   Pt states she has been having approx 2 diarrhea episodes per day recently; and sheis managing the diarrhea with Imodium.          Nausea without vomiting Patient did experience one episode of nausea/vomiting yesterday.   She states she already has antiemetics at home to take on an as-needed basis for nausea.  She states her nausea has passed for the time being.     Patient stated understanding of all instructions; and was in agreement with this plan of care. The patient knows to call the clinic with any  problems, questions or concerns.   Today was a shared visit with Dr. Julien Nordmann. .   Total time spent with patient was 25 minutes;  with greater than 75 percent of that time spent in face to face counseling regarding patient's symptoms,  and coordination of care and follow up.  Disclaimer: This note was dictated with voice recognition software. Similar sounding words can inadvertently be transcribed and may not be corrected upon review.   Drue Second, NP 10/31/2014   ADDENDUM: Hematology/Oncology Attending: I had a face to face encounter with the patient today. I recommended her care plan. This is a very pleasant 64 years old white female with metastatic non-small cell lung cancer status post several chemotherapy regimen including immunotherapy. She is currently undergoing treatment with Gilotrif 30 mg by mouth twice a day because of the adverse effect including diarrhea as well as oral mucositis. She is also undergoing palliative radiotherapy to the metastatic bone lesion in the left hip area. The patient continues to have dehydration secondary to her diarrhea as well as the oral mucositis. We will arrange for her to receive IV hydration in the clinic today. She was advised to take Imodium as needed for diarrhea. She is also on Magic mouthwash for the oral mucositis. The patient would come back for follow-up visit in 2 weeks for reevaluation after repeating CT scan of the chest, abdomen and pelvis for restaging of her disease. She was advised to call immediately if she has any concerning symptoms in the interval.  Disclaimer: This note was dictated with voice recognition software. Similar sounding words can inadvertently be transcribed and may be missed upon review. Eilleen Kempf., MD 10/31/2014

## 2014-10-31 NOTE — Telephone Encounter (Signed)
Per staff message and POF I have scheduled appts. Advised scheduler of appts. JMW  

## 2014-10-31 NOTE — Telephone Encounter (Signed)
Pt scheduled for labs/ov with NP/CB per 07/18 POF, pt is aware... Sherri Stafford

## 2014-11-01 ENCOUNTER — Telehealth: Payer: Self-pay | Admitting: Internal Medicine

## 2014-11-01 ENCOUNTER — Telehealth: Payer: Self-pay | Admitting: Medical Oncology

## 2014-11-01 ENCOUNTER — Other Ambulatory Visit: Payer: Self-pay | Admitting: Medical Oncology

## 2014-11-01 DIAGNOSIS — R112 Nausea with vomiting, unspecified: Secondary | ICD-10-CM

## 2014-11-01 MED ORDER — OLANZAPINE 10 MG PO TBDP: ORAL_TABLET | ORAL | Status: AC

## 2014-11-01 NOTE — Telephone Encounter (Signed)
Returned Advertising account executive. Patient confirmed appointment for 07/20 & 07/22 IVF

## 2014-11-01 NOTE — Telephone Encounter (Signed)
Per Julien Nordmann -Zyprexa ordered

## 2014-11-01 NOTE — Addendum Note (Signed)
Addended by: Ardeen Garland on: 11/01/2014 03:50 PM   Modules accepted: Orders

## 2014-11-01 NOTE — Telephone Encounter (Signed)
Vomited last night 2a-5am -compazine not effective.

## 2014-11-02 ENCOUNTER — Ambulatory Visit (HOSPITAL_BASED_OUTPATIENT_CLINIC_OR_DEPARTMENT_OTHER): Payer: BC Managed Care – PPO

## 2014-11-02 ENCOUNTER — Other Ambulatory Visit: Payer: Self-pay | Admitting: Medical Oncology

## 2014-11-02 ENCOUNTER — Encounter: Payer: Self-pay | Admitting: Medical Oncology

## 2014-11-02 VITALS — BP 116/65 | HR 94 | Temp 97.1°F | Resp 16

## 2014-11-02 DIAGNOSIS — C7931 Secondary malignant neoplasm of brain: Secondary | ICD-10-CM

## 2014-11-02 DIAGNOSIS — C3432 Malignant neoplasm of lower lobe, left bronchus or lung: Secondary | ICD-10-CM | POA: Diagnosis not present

## 2014-11-02 DIAGNOSIS — R112 Nausea with vomiting, unspecified: Secondary | ICD-10-CM

## 2014-11-02 DIAGNOSIS — C7951 Secondary malignant neoplasm of bone: Secondary | ICD-10-CM | POA: Diagnosis not present

## 2014-11-02 DIAGNOSIS — C3490 Malignant neoplasm of unspecified part of unspecified bronchus or lung: Secondary | ICD-10-CM

## 2014-11-02 MED ORDER — SODIUM CHLORIDE 0.9 % IV SOLN
1000.0000 mL | Freq: Once | INTRAVENOUS | Status: AC
  Administered 2014-11-02: 1000 mL via INTRAVENOUS

## 2014-11-02 NOTE — Patient Instructions (Signed)
Dehydration, Adult Dehydration is when you lose more fluids from the body than you take in. Vital organs like the kidneys, brain, and heart cannot function without a proper amount of fluids and salt. Any loss of fluids from the body can cause dehydration.  CAUSES   Vomiting.  Diarrhea.  Excessive sweating.  Excessive urine output.  Fever. SYMPTOMS  Mild dehydration  Thirst.  Dry lips.  Slightly dry mouth. Moderate dehydration  Very dry mouth.  Sunken eyes.  Skin does not bounce back quickly when lightly pinched and released.  Dark urine and decreased urine production.  Decreased tear production.  Headache. Severe dehydration  Very dry mouth.  Extreme thirst.  Rapid, weak pulse (more than 100 beats per minute at rest).  Cold hands and feet.  Not able to sweat in spite of heat and temperature.  Rapid breathing.  Blue lips.  Confusion and lethargy.  Difficulty being awakened.  Minimal urine production.  No tears. DIAGNOSIS  Your caregiver will diagnose dehydration based on your symptoms and your exam. Blood and urine tests will help confirm the diagnosis. The diagnostic evaluation should also identify the cause of dehydration. TREATMENT  Treatment of mild or moderate dehydration can often be done at home by increasing the amount of fluids that you drink. It is best to drink small amounts of fluid more often. Drinking too much at one time can make vomiting worse. Refer to the home care instructions below. Severe dehydration needs to be treated at the hospital where you will probably be given intravenous (IV) fluids that contain water and electrolytes. HOME CARE INSTRUCTIONS   Ask your caregiver about specific rehydration instructions.  Drink enough fluids to keep your urine clear or pale yellow.  Drink small amounts frequently if you have nausea and vomiting.  Eat as you normally do.  Avoid:  Foods or drinks high in sugar.  Carbonated  drinks.  Juice.  Extremely hot or cold fluids.  Drinks with caffeine.  Fatty, greasy foods.  Alcohol.  Tobacco.  Overeating.  Gelatin desserts.  Wash your hands well to avoid spreading bacteria and viruses.  Only take over-the-counter or prescription medicines for pain, discomfort, or fever as directed by your caregiver.  Ask your caregiver if you should continue all prescribed and over-the-counter medicines.  Keep all follow-up appointments with your caregiver. SEEK MEDICAL CARE IF:  You have abdominal pain and it increases or stays in one area (localizes).  You have a rash, stiff neck, or severe headache.  You are irritable, sleepy, or difficult to awaken.  You are weak, dizzy, or extremely thirsty. SEEK IMMEDIATE MEDICAL CARE IF:   You are unable to keep fluids down or you get worse despite treatment.  You have frequent episodes of vomiting or diarrhea.  You have blood or green matter (bile) in your vomit.  You have blood in your stool or your stool looks black and tarry.  You have not urinated in 6 to 8 hours, or you have only urinated a small amount of very dark urine.  You have a fever.  You faint. MAKE SURE YOU:   Understand these instructions.  Will watch your condition.  Will get help right away if you are not doing well or get worse. Document Released: 04/01/2005 Document Revised: 06/24/2011 Document Reviewed: 11/19/2010 ExitCare Patient Information 2015 [employer]. This information is not intended to replace advice given to you by your health care provider. Make sure you discuss any questions you have with your health care   provider.  

## 2014-11-03 ENCOUNTER — Telehealth: Payer: Self-pay | Admitting: Medical Oncology

## 2014-11-03 NOTE — Telephone Encounter (Signed)
I called pt and told her Sherri Stafford said she can get ivf at home. She will let me know tomorrow.

## 2014-11-04 ENCOUNTER — Ambulatory Visit (HOSPITAL_BASED_OUTPATIENT_CLINIC_OR_DEPARTMENT_OTHER): Payer: BC Managed Care – PPO

## 2014-11-04 ENCOUNTER — Telehealth: Payer: Self-pay | Admitting: *Deleted

## 2014-11-04 ENCOUNTER — Other Ambulatory Visit: Payer: Self-pay | Admitting: Medical Oncology

## 2014-11-04 VITALS — BP 99/54 | HR 117 | Temp 98.5°F | Resp 24

## 2014-11-04 DIAGNOSIS — C7951 Secondary malignant neoplasm of bone: Secondary | ICD-10-CM | POA: Diagnosis not present

## 2014-11-04 DIAGNOSIS — C7931 Secondary malignant neoplasm of brain: Secondary | ICD-10-CM | POA: Diagnosis not present

## 2014-11-04 DIAGNOSIS — R1111 Vomiting without nausea: Secondary | ICD-10-CM

## 2014-11-04 DIAGNOSIS — C3432 Malignant neoplasm of lower lobe, left bronchus or lung: Secondary | ICD-10-CM | POA: Diagnosis not present

## 2014-11-04 DIAGNOSIS — R112 Nausea with vomiting, unspecified: Secondary | ICD-10-CM

## 2014-11-04 MED ORDER — HEPARIN SOD (PORK) LOCK FLUSH 100 UNIT/ML IV SOLN
500.0000 [IU] | Freq: Once | INTRAVENOUS | Status: AC
  Administered 2014-11-04: 500 [IU] via INTRAVENOUS
  Filled 2014-11-04: qty 5

## 2014-11-04 MED ORDER — SODIUM CHLORIDE 0.9 % IV SOLN
INTRAVENOUS | Status: DC
  Administered 2014-11-04: 13:00:00 via INTRAVENOUS

## 2014-11-04 MED ORDER — SODIUM CHLORIDE 0.9 % IJ SOLN
10.0000 mL | Freq: Once | INTRAMUSCULAR | Status: AC
  Administered 2014-11-04: 10 mL via INTRAVENOUS
  Filled 2014-11-04: qty 10

## 2014-11-04 NOTE — Progress Notes (Signed)
1423 discharged via wheelchair with spouse.  No distress.  Denies questions or concerns.  Talked with collaborative Abelina Bachelor RN about next treatments.Marland Kitchen

## 2014-11-04 NOTE — Patient Instructions (Signed)
Dehydration, Adult Dehydration is when you lose more fluids from the body than you take in. Vital organs like the kidneys, brain, and heart cannot function without a proper amount of fluids and salt. Any loss of fluids from the body can cause dehydration.  CAUSES   Vomiting.  Diarrhea.  Excessive sweating.  Excessive urine output.  Fever. SYMPTOMS  Mild dehydration  Thirst.  Dry lips.  Slightly dry mouth. Moderate dehydration  Very dry mouth.  Sunken eyes.  Skin does not bounce back quickly when lightly pinched and released.  Dark urine and decreased urine production.  Decreased tear production.  Headache. Severe dehydration  Very dry mouth.  Extreme thirst.  Rapid, weak pulse (more than 100 beats per minute at rest).  Cold hands and feet.  Not able to sweat in spite of heat and temperature.  Rapid breathing.  Blue lips.  Confusion and lethargy.  Difficulty being awakened.  Minimal urine production.  No tears. DIAGNOSIS  Your caregiver will diagnose dehydration based on your symptoms and your exam. Blood and urine tests will help confirm the diagnosis. The diagnostic evaluation should also identify the cause of dehydration. TREATMENT  Treatment of mild or moderate dehydration can often be done at home by increasing the amount of fluids that you drink. It is best to drink small amounts of fluid more often. Drinking too much at one time can make vomiting worse. Refer to the home care instructions below. Severe dehydration needs to be treated at the hospital where you will probably be given intravenous (IV) fluids that contain water and electrolytes. HOME CARE INSTRUCTIONS   Ask your caregiver about specific rehydration instructions.  Drink enough fluids to keep your urine clear or pale yellow.  Drink small amounts frequently if you have nausea and vomiting.  Eat as you normally do.  Avoid:  Foods or drinks high in sugar.  Carbonated  drinks.  Juice.  Extremely hot or cold fluids.  Drinks with caffeine.  Fatty, greasy foods.  Alcohol.  Tobacco.  Overeating.  Gelatin desserts.  Wash your hands well to avoid spreading bacteria and viruses.  Only take over-the-counter or prescription medicines for pain, discomfort, or fever as directed by your caregiver.  Ask your caregiver if you should continue all prescribed and over-the-counter medicines.  Keep all follow-up appointments with your caregiver. SEEK MEDICAL CARE IF:  You have abdominal pain and it increases or stays in one area (localizes).  You have a rash, stiff neck, or severe headache.  You are irritable, sleepy, or difficult to awaken.  You are weak, dizzy, or extremely thirsty. SEEK IMMEDIATE MEDICAL CARE IF:   You are unable to keep fluids down or you get worse despite treatment.  You have frequent episodes of vomiting or diarrhea.  You have blood or green matter (bile) in your vomit.  You have blood in your stool or your stool looks black and tarry.  You have not urinated in 6 to 8 hours, or you have only urinated a small amount of very dark urine.  You have a fever.  You faint. MAKE SURE YOU:   Understand these instructions.  Will watch your condition.  Will get help right away if you are not doing well or get worse. Document Released: 04/01/2005 Document Revised: 06/24/2011 Document Reviewed: 11/19/2010 ExitCare Patient Information 2015 [employer]. This information is not intended to replace advice given to you by your health care provider. Make sure you discuss any questions you have with your health care   provider.  

## 2014-11-04 NOTE — Telephone Encounter (Signed)
Per staff message and POF I have scheduled appts. Advised scheduler of appts. JMW  

## 2014-11-06 NOTE — Progress Notes (Signed)
  Radiation Oncology         [phone] ________________________________  Name: Sherri Stafford MRN: [id number]  Date: 10/26/2014  DOB: Mar 10, 1951  End of Treatment Note  Diagnosis:      stage IV (T2a, N3, M1b) non-small cell lung cancer  Indication for treatment:  Painful osseous metastasis , right sacroiliac joint and left acetabular area      Radiation treatment dates:   10/12/2014-10/26/2014  Site/dose:   Pelvis region 20 gray in 10 fractions,  cumulative to dose to the left acetabular area was limited in light of her previous treatment to this area at [facility].  Beams/energy:   3-D helical, 6x beams on the tomotherapy unit  Narrative: The patient tolerated radiation treatment relatively well.   She did experience nausea during the course of retreatment as well as some emesis. She did require IV fluid supplementation. Overall her pain in the areas of concern did improve towards the end of her therapy.  Plan: The patient has completed radiation treatment. The patient will return to radiation oncology clinic for routine followup in one month. I advised them to call or return sooner if they have any questions or concerns related to their recovery or treatment.  -----------------------------------  Blair Promise, PhD, MD

## 2014-11-07 ENCOUNTER — Ambulatory Visit (HOSPITAL_BASED_OUTPATIENT_CLINIC_OR_DEPARTMENT_OTHER): Payer: BC Managed Care – PPO

## 2014-11-07 VITALS — BP 102/63 | HR 94 | Temp 98.2°F | Resp 18

## 2014-11-07 DIAGNOSIS — R1111 Vomiting without nausea: Secondary | ICD-10-CM | POA: Diagnosis not present

## 2014-11-07 DIAGNOSIS — C7931 Secondary malignant neoplasm of brain: Secondary | ICD-10-CM | POA: Diagnosis not present

## 2014-11-07 DIAGNOSIS — C7951 Secondary malignant neoplasm of bone: Secondary | ICD-10-CM

## 2014-11-07 DIAGNOSIS — C3490 Malignant neoplasm of unspecified part of unspecified bronchus or lung: Secondary | ICD-10-CM

## 2014-11-07 DIAGNOSIS — C3432 Malignant neoplasm of lower lobe, left bronchus or lung: Secondary | ICD-10-CM | POA: Diagnosis not present

## 2014-11-07 MED ORDER — HEPARIN SOD (PORK) LOCK FLUSH 100 UNIT/ML IV SOLN
500.0000 [IU] | INTRAVENOUS | Status: AC | PRN
  Administered 2014-11-07: 500 [IU]
  Filled 2014-11-07: qty 5

## 2014-11-07 MED ORDER — SODIUM CHLORIDE 0.9 % IJ SOLN
10.0000 mL | INTRAMUSCULAR | Status: AC | PRN
  Administered 2014-11-07: 10 mL
  Filled 2014-11-07: qty 10

## 2014-11-07 MED ORDER — METHYLPREDNISOLONE SODIUM SUCC 125 MG IJ SOLR: 125.0000 mg | Freq: Once | INTRAMUSCULAR | Status: DC | PRN

## 2014-11-07 MED ORDER — SODIUM CHLORIDE 0.9 % IV SOLN
INTRAVENOUS | Status: DC
  Administered 2014-11-07: 16:00:00 via INTRAVENOUS

## 2014-11-07 NOTE — Patient Instructions (Signed)
Dehydration, Adult Dehydration is when you lose more fluids from the body than you take in. Vital organs like the kidneys, brain, and heart cannot function without a proper amount of fluids and salt. Any loss of fluids from the body can cause dehydration.  CAUSES   Vomiting.  Diarrhea.  Excessive sweating.  Excessive urine output.  Fever. SYMPTOMS  Mild dehydration  Thirst.  Dry lips.  Slightly dry mouth. Moderate dehydration  Very dry mouth.  Sunken eyes.  Skin does not bounce back quickly when lightly pinched and released.  Dark urine and decreased urine production.  Decreased tear production.  Headache. Severe dehydration  Very dry mouth.  Extreme thirst.  Rapid, weak pulse (more than 100 beats per minute at rest).  Cold hands and feet.  Not able to sweat in spite of heat and temperature.  Rapid breathing.  Blue lips.  Confusion and lethargy.  Difficulty being awakened.  Minimal urine production.  No tears. DIAGNOSIS  Your caregiver will diagnose dehydration based on your symptoms and your exam. Blood and urine tests will help confirm the diagnosis. The diagnostic evaluation should also identify the cause of dehydration. TREATMENT  Treatment of mild or moderate dehydration can often be done at home by increasing the amount of fluids that you drink. It is best to drink small amounts of fluid more often. Drinking too much at one time can make vomiting worse. Refer to the home care instructions below. Severe dehydration needs to be treated at the hospital where you will probably be given intravenous (IV) fluids that contain water and electrolytes. HOME CARE INSTRUCTIONS   Ask your caregiver about specific rehydration instructions.  Drink enough fluids to keep your urine clear or pale yellow.  Drink small amounts frequently if you have nausea and vomiting.  Eat as you normally do.  Avoid:  Foods or drinks high in sugar.  Carbonated  drinks.  Juice.  Extremely hot or cold fluids.  Drinks with caffeine.  Fatty, greasy foods.  Alcohol.  Tobacco.  Overeating.  Gelatin desserts.  Wash your hands well to avoid spreading bacteria and viruses.  Only take over-the-counter or prescription medicines for pain, discomfort, or fever as directed by your caregiver.  Ask your caregiver if you should continue all prescribed and over-the-counter medicines.  Keep all follow-up appointments with your caregiver. SEEK MEDICAL CARE IF:  You have abdominal pain and it increases or stays in one area (localizes).  You have a rash, stiff neck, or severe headache.  You are irritable, sleepy, or difficult to awaken.  You are weak, dizzy, or extremely thirsty. SEEK IMMEDIATE MEDICAL CARE IF:   You are unable to keep fluids down or you get worse despite treatment.  You have frequent episodes of vomiting or diarrhea.  You have blood or green matter (bile) in your vomit.  You have blood in your stool or your stool looks black and tarry.  You have not urinated in 6 to 8 hours, or you have only urinated a small amount of very dark urine.  You have a fever.  You faint. MAKE SURE YOU:   Understand these instructions.  Will watch your condition.  Will get help right away if you are not doing well or get worse. Document Released: 04/01/2005 Document Revised: 06/24/2011 Document Reviewed: 11/19/2010 ExitCare Patient Information 2015 [employer]. This information is not intended to replace advice given to you by your health care provider. Make sure you discuss any questions you have with your health care   provider.  

## 2014-11-07 NOTE — Progress Notes (Signed)
Patient did not want a copy of her AVS.

## 2014-11-09 ENCOUNTER — Ambulatory Visit: Payer: BC Managed Care – PPO

## 2014-11-09 ENCOUNTER — Encounter (HOSPITAL_COMMUNITY): Payer: Self-pay

## 2014-11-09 ENCOUNTER — Ambulatory Visit (HOSPITAL_COMMUNITY)
Admission: RE | Admit: 2014-11-09 | Discharge: 2014-11-09 | Disposition: A | Discharge status: Home / Self Care | Payer: BC Managed Care – PPO | Source: Ambulatory Visit | Attending: Internal Medicine | Admitting: Internal Medicine

## 2014-11-09 ENCOUNTER — Other Ambulatory Visit (HOSPITAL_BASED_OUTPATIENT_CLINIC_OR_DEPARTMENT_OTHER): Payer: BC Managed Care – PPO

## 2014-11-09 ENCOUNTER — Ambulatory Visit (HOSPITAL_BASED_OUTPATIENT_CLINIC_OR_DEPARTMENT_OTHER): Payer: BC Managed Care – PPO

## 2014-11-09 VITALS — BP 108/65 | HR 92 | Temp 98.4°F | Resp 18

## 2014-11-09 DIAGNOSIS — Z08 Encounter for follow-up examination after completed treatment for malignant neoplasm: Secondary | ICD-10-CM | POA: Insufficient documentation

## 2014-11-09 DIAGNOSIS — C7931 Secondary malignant neoplasm of brain: Secondary | ICD-10-CM | POA: Diagnosis not present

## 2014-11-09 DIAGNOSIS — E860 Dehydration: Secondary | ICD-10-CM

## 2014-11-09 DIAGNOSIS — C3432 Malignant neoplasm of lower lobe, left bronchus or lung: Secondary | ICD-10-CM | POA: Diagnosis not present

## 2014-11-09 DIAGNOSIS — C3490 Malignant neoplasm of unspecified part of unspecified bronchus or lung: Secondary | ICD-10-CM

## 2014-11-09 DIAGNOSIS — R1111 Vomiting without nausea: Secondary | ICD-10-CM

## 2014-11-09 DIAGNOSIS — C7951 Secondary malignant neoplasm of bone: Secondary | ICD-10-CM | POA: Diagnosis not present

## 2014-11-09 DIAGNOSIS — Z95828 Presence of other vascular implants and grafts: Secondary | ICD-10-CM

## 2014-11-09 DIAGNOSIS — Z85118 Personal history of other malignant neoplasm of bronchus and lung: Secondary | ICD-10-CM | POA: Insufficient documentation

## 2014-11-09 LAB — CBC WITH DIFFERENTIAL/PLATELET
BASO%: 0.3 % (ref 0.0–2.0)
BASOS ABS: 0 10*3/uL (ref 0.0–0.1)
EOS%: 6.9 % (ref 0.0–7.0)
Eosinophils Absolute: 0.3 10*3/uL (ref 0.0–0.5)
HCT: 30.2 % — ABNORMAL LOW (ref 34.8–46.6)
HEMOGLOBIN: 9.8 g/dL — AB (ref 11.6–15.9)
LYMPH%: 6 % — AB (ref 14.0–49.7)
MCH: 26.2 pg (ref 25.1–34.0)
MCHC: 32.3 g/dL (ref 31.5–36.0)
MCV: 80.9 fL (ref 79.5–101.0)
MONO#: 0.4 10*3/uL (ref 0.1–0.9)
MONO%: 8.1 % (ref 0.0–14.0)
NEUT%: 78.7 % — AB (ref 38.4–76.8)
NEUTROS ABS: 3.5 10*3/uL (ref 1.5–6.5)
PLATELETS: 139 10*3/uL — AB (ref 145–400)
RBC: 3.73 10*6/uL (ref 3.70–5.45)
RDW: 17 % — ABNORMAL HIGH (ref 11.2–14.5)
WBC: 4.5 10*3/uL (ref 3.9–10.3)
lymph#: 0.3 10*3/uL — ABNORMAL LOW (ref 0.9–3.3)

## 2014-11-09 LAB — COMPREHENSIVE METABOLIC PANEL (CC13)
ALT: 11 U/L (ref 0–55)
ANION GAP: 10 meq/L (ref 3–11)
AST: 14 U/L (ref 5–34)
Albumin: 3 g/dL — ABNORMAL LOW (ref 3.5–5.0)
Alkaline Phosphatase: 75 U/L (ref 40–150)
BUN: 11.7 mg/dL (ref 7.0–26.0)
CALCIUM: 8.4 mg/dL (ref 8.4–10.4)
CO2: 22 meq/L (ref 22–29)
CREATININE: 0.7 mg/dL (ref 0.6–1.1)
Chloride: 102 mEq/L (ref 98–109)
EGFR: >90 mL/min/{1.73_m2} (ref >90)
GLUCOSE: 95 mg/dL (ref 70–140)
Potassium: 4 mEq/L (ref 3.5–5.1)
Sodium: 134 mEq/L — ABNORMAL LOW (ref 136–145)
TOTAL PROTEIN: 6.2 g/dL — AB (ref 6.4–8.3)
Total Bilirubin: 1.34 mg/dL — ABNORMAL HIGH (ref 0.20–1.20)

## 2014-11-09 MED ORDER — SODIUM CHLORIDE 0.9 % IJ SOLN
10.0000 mL | INTRAMUSCULAR | Status: DC | PRN
  Administered 2014-11-09: 10 mL via INTRAVENOUS
  Filled 2014-11-09: qty 10

## 2014-11-09 MED ORDER — DENOSUMAB 120 MG/1.7ML ~~LOC~~ SOLN
120.0000 mg | Freq: Once | SUBCUTANEOUS | Status: AC
  Administered 2014-11-09: 120 mg via SUBCUTANEOUS
  Filled 2014-11-09: qty 1.7

## 2014-11-09 MED ORDER — IOHEXOL 300 MG/ML  SOLN
100.0000 mL | Freq: Once | INTRAMUSCULAR | Status: AC | PRN
  Administered 2014-11-09: 80 mL via INTRAVENOUS

## 2014-11-09 MED ORDER — SODIUM CHLORIDE 0.9 % IV SOLN
INTRAVENOUS | Status: DC
  Administered 2014-11-09: 10:00:00 via INTRAVENOUS

## 2014-11-09 MED ORDER — HEPARIN SOD (PORK) LOCK FLUSH 100 UNIT/ML IV SOLN
500.0000 [IU] | Freq: Once | INTRAVENOUS | Status: AC
  Administered 2014-11-09: 500 [IU] via INTRAVENOUS
  Filled 2014-11-09: qty 5

## 2014-11-09 NOTE — Patient Instructions (Signed)

## 2014-11-09 NOTE — Patient Instructions (Addendum)
Dehydration, Adult Dehydration is when you lose more fluids from the body than you take in. Vital organs like the kidneys, brain, and heart cannot function without a proper amount of fluids and salt. Any loss of fluids from the body can cause dehydration.  CAUSES   Vomiting.  Diarrhea.  Excessive sweating.  Excessive urine output.  Fever. SYMPTOMS  Mild dehydration  Thirst.  Dry lips.  Slightly dry mouth. Moderate dehydration  Very dry mouth.  Sunken eyes.  Skin does not bounce back quickly when lightly pinched and released.  Dark urine and decreased urine production.  Decreased tear production.  Headache. Severe dehydration  Very dry mouth.  Extreme thirst.  Rapid, weak pulse (more than 100 beats per minute at rest).  Cold hands and feet.  Not able to sweat in spite of heat and temperature.  Rapid breathing.  Blue lips.  Confusion and lethargy.  Difficulty being awakened.  Minimal urine production.  No tears. DIAGNOSIS  Your caregiver will diagnose dehydration based on your symptoms and your exam. Blood and urine tests will help confirm the diagnosis. The diagnostic evaluation should also identify the cause of dehydration. TREATMENT  Treatment of mild or moderate dehydration can often be done at home by increasing the amount of fluids that you drink. It is best to drink small amounts of fluid more often. Drinking too much at one time can make vomiting worse. Refer to the home care instructions below. Severe dehydration needs to be treated at the hospital where you will probably be given intravenous (IV) fluids that contain water and electrolytes. HOME CARE INSTRUCTIONS   Ask your caregiver about specific rehydration instructions.  Drink enough fluids to keep your urine clear or pale yellow.  Drink small amounts frequently if you have nausea and vomiting.  Eat as you normally do.  Avoid:  Foods or drinks high in sugar.  Carbonated  drinks.  Juice.  Extremely hot or cold fluids.  Drinks with caffeine.  Fatty, greasy foods.  Alcohol.  Tobacco.  Overeating.  Gelatin desserts.  Wash your hands well to avoid spreading bacteria and viruses.  Only take over-the-counter or prescription medicines for pain, discomfort, or fever as directed by your caregiver.  Ask your caregiver if you should continue all prescribed and over-the-counter medicines.  Keep all follow-up appointments with your caregiver. SEEK MEDICAL CARE IF:  You have abdominal pain and it increases or stays in one area (localizes).  You have a rash, stiff neck, or severe headache.  You are irritable, sleepy, or difficult to awaken.  You are weak, dizzy, or extremely thirsty. SEEK IMMEDIATE MEDICAL CARE IF:   You are unable to keep fluids down or you get worse despite treatment.  You have frequent episodes of vomiting or diarrhea.  You have blood or green matter (bile) in your vomit.  You have blood in your stool or your stool looks black and tarry.  You have not urinated in 6 to 8 hours, or you have only urinated a small amount of very dark urine.  You have a fever.  You faint. MAKE SURE YOU:   Understand these instructions.  Will watch your condition.  Will get help right away if you are not doing well or get worse. Document Released: 04/01/2005 Document Revised: 06/24/2011 Document Reviewed: 11/19/2010 ExitCare Patient Information 2015 [employer]. This information is not intended to replace advice given to you by your health care provider. Make sure you discuss any questions you have with your health care   provider.   Denosumab injection What is this medicine? DENOSUMAB (den oh Magdeline mab) slows bone breakdown. Prolia is used to treat osteoporosis in women after menopause and in men. Delton See is used to prevent bone fractures and other bone problems caused by cancer bone metastases. Delton See is also used to treat giant cell tumor  of the bone. This medicine may be used for other purposes; ask your health care provider or pharmacist if you have questions. COMMON BRAND NAME(S): Prolia, XGEVA What should I tell my health care provider before I take this medicine? They need to know if you have any of these conditions: -dental disease -eczema -infection or history of infections -kidney disease or on dialysis -low blood calcium or vitamin D -malabsorption syndrome -scheduled to have surgery or tooth extraction -taking medicine that contains denosumab -thyroid or parathyroid disease -an unusual reaction to denosumab, other medicines, foods, dyes, or preservatives -pregnant or trying to get pregnant -breast-feeding How should I use this medicine? This medicine is for injection under the skin. It is given by a health care professional in a hospital or clinic setting. If you are getting Prolia, a special MedGuide will be given to you by the pharmacist with each prescription and refill. Be sure to read this information carefully each time. For Prolia, talk to your pediatrician regarding the use of this medicine in children. Special care may be needed. For Delton See, talk to your pediatrician regarding the use of this medicine in children. While this drug may be prescribed for children as young as 13 years for selected conditions, precautions do apply. Overdosage: If you think you've taken too much of this medicine contact a poison control center or emergency room at once. Overdosage: If you think you have taken too much of this medicine contact a poison control center or emergency room at once. NOTE: This medicine is only for you. Do not share this medicine with others. What if I miss a dose? It is important not to miss your dose. Call your doctor or health care professional if you are unable to keep an appointment. What may interact with this medicine? Do not take this medicine with any of the following medications: -other  medicines containing denosumab This medicine may also interact with the following medications: -medicines that suppress the immune system -medicines that treat cancer -steroid medicines like prednisone or cortisone This list may not describe all possible interactions. Give your health care provider a list of all the medicines, herbs, non-prescription drugs, or dietary supplements you use. Also tell them if you smoke, drink alcohol, or use illegal drugs. Some items may interact with your medicine. What should I watch for while using this medicine? Visit your doctor or health care professional for regular checks on your progress. Your doctor or health care professional may order blood tests and other tests to see how you are doing. Call your doctor or health care professional if you get a cold or other infection while receiving this medicine. Do not treat yourself. This medicine may decrease your body's ability to fight infection. You should make sure you get enough calcium and vitamin D while you are taking this medicine, unless your doctor tells you not to. Discuss the foods you eat and the vitamins you take with your health care professional. See your dentist regularly. Brush and floss your teeth as directed. Before you have any dental work done, tell your dentist you are receiving this medicine. Do not become pregnant while taking this medicine or for  5 months after stopping it. Women should inform their doctor if they wish to become pregnant or think they might be pregnant. There is a potential for serious side effects to an unborn child. Talk to your health care professional or pharmacist for more information. What side effects may I notice from receiving this medicine? Side effects that you should report to your doctor or health care professional as soon as possible: -allergic reactions like skin rash, itching or hives, swelling of the face, lips, or tongue -breathing problems -chest  pain -fast, irregular heartbeat -feeling faint or lightheaded, falls -fever, chills, or any other sign of infection -muscle spasms, tightening, or twitches -numbness or tingling -skin blisters or bumps, or is dry, peels, or red -slow healing or unexplained pain in the mouth or jaw -unusual bleeding or bruising Side effects that usually do not require medical attention (Report these to your doctor or health care professional if they continue or are bothersome.): -muscle pain -stomach upset, gas This list may not describe all possible side effects. Call your doctor for medical advice about side effects. You may report side effects to FDA at 1-800-FDA-1088. Where should I keep my medicine? This medicine is only given in a clinic, doctor's office, or other health care setting and will not be stored at home. NOTE: This sheet is a summary. It may not cover all possible information. If you have questions about this medicine, talk to your doctor, pharmacist, or health care provider.  2015, Elsevier/Gold Standard. (2011-09-30 12:37:47)

## 2014-11-11 ENCOUNTER — Other Ambulatory Visit: Payer: Self-pay | Admitting: Medical Oncology

## 2014-11-11 ENCOUNTER — Ambulatory Visit (HOSPITAL_BASED_OUTPATIENT_CLINIC_OR_DEPARTMENT_OTHER): Payer: BC Managed Care – PPO

## 2014-11-11 VITALS — BP 111/72 | HR 118 | Temp 97.9°F | Resp 16

## 2014-11-11 DIAGNOSIS — C7931 Secondary malignant neoplasm of brain: Secondary | ICD-10-CM | POA: Diagnosis not present

## 2014-11-11 DIAGNOSIS — E860 Dehydration: Secondary | ICD-10-CM | POA: Diagnosis not present

## 2014-11-11 DIAGNOSIS — C7951 Secondary malignant neoplasm of bone: Secondary | ICD-10-CM

## 2014-11-11 DIAGNOSIS — C3490 Malignant neoplasm of unspecified part of unspecified bronchus or lung: Secondary | ICD-10-CM

## 2014-11-11 DIAGNOSIS — C3432 Malignant neoplasm of lower lobe, left bronchus or lung: Secondary | ICD-10-CM

## 2014-11-11 MED ORDER — HEPARIN SOD (PORK) LOCK FLUSH 100 UNIT/ML IV SOLN
500.0000 [IU] | INTRAVENOUS | Status: AC | PRN
  Administered 2014-11-11: 500 [IU]
  Filled 2014-11-11: qty 5

## 2014-11-11 MED ORDER — SODIUM CHLORIDE 0.9 % IJ SOLN
10.0000 mL | INTRAMUSCULAR | Status: AC | PRN
  Administered 2014-11-11: 10 mL
  Filled 2014-11-11: qty 10

## 2014-11-11 MED ORDER — SODIUM CHLORIDE 0.9 % IV SOLN
INTRAVENOUS | Status: DC
  Administered 2014-11-11: 13:00:00 via INTRAVENOUS

## 2014-11-11 NOTE — Patient Instructions (Signed)
Dehydration, Adult Dehydration is when you lose more fluids from the body than you take in. Vital organs like the kidneys, brain, and heart cannot function without a proper amount of fluids and salt. Any loss of fluids from the body can cause dehydration.  CAUSES   Vomiting.  Diarrhea.  Excessive sweating.  Excessive urine output.  Fever. SYMPTOMS  Mild dehydration  Thirst.  Dry lips.  Slightly dry mouth. Moderate dehydration  Very dry mouth.  Sunken eyes.  Skin does not bounce back quickly when lightly pinched and released.  Dark urine and decreased urine production.  Decreased tear production.  Headache. Severe dehydration  Very dry mouth.  Extreme thirst.  Rapid, weak pulse (more than 100 beats per minute at rest).  Cold hands and feet.  Not able to sweat in spite of heat and temperature.  Rapid breathing.  Blue lips.  Confusion and lethargy.  Difficulty being awakened.  Minimal urine production.  No tears. DIAGNOSIS  Your caregiver will diagnose dehydration based on your symptoms and your exam. Blood and urine tests will help confirm the diagnosis. The diagnostic evaluation should also identify the cause of dehydration. TREATMENT  Treatment of mild or moderate dehydration can often be done at home by increasing the amount of fluids that you drink. It is best to drink small amounts of fluid more often. Drinking too much at one time can make vomiting worse. Refer to the home care instructions below. Severe dehydration needs to be treated at the hospital where you will probably be given intravenous (IV) fluids that contain water and electrolytes. HOME CARE INSTRUCTIONS   Ask your caregiver about specific rehydration instructions.  Drink enough fluids to keep your urine clear or pale yellow.  Drink small amounts frequently if you have nausea and vomiting.  Eat as you normally do.  Avoid:  Foods or drinks high in sugar.  Carbonated  drinks.  Juice.  Extremely hot or cold fluids.  Drinks with caffeine.  Fatty, greasy foods.  Alcohol.  Tobacco.  Overeating.  Gelatin desserts.  Wash your hands well to avoid spreading bacteria and viruses.  Only take over-the-counter or prescription medicines for pain, discomfort, or fever as directed by your caregiver.  Ask your caregiver if you should continue all prescribed and over-the-counter medicines.  Keep all follow-up appointments with your caregiver. SEEK MEDICAL CARE IF:  You have abdominal pain and it increases or stays in one area (localizes).  You have a rash, stiff neck, or severe headache.  You are irritable, sleepy, or difficult to awaken.  You are weak, dizzy, or extremely thirsty. SEEK IMMEDIATE MEDICAL CARE IF:   You are unable to keep fluids down or you get worse despite treatment.  You have frequent episodes of vomiting or diarrhea.  You have blood or green matter (bile) in your vomit.  You have blood in your stool or your stool looks black and tarry.  You have not urinated in 6 to 8 hours, or you have only urinated a small amount of very dark urine.  You have a fever.  You faint. MAKE SURE YOU:   Understand these instructions.  Will watch your condition.  Will get help right away if you are not doing well or get worse. Document Released: 04/01/2005 Document Revised: 06/24/2011 Document Reviewed: 11/19/2010 ExitCare Patient Information 2015 [employer]. This information is not intended to replace advice given to you by your health care provider. Make sure you discuss any questions you have with your health care   provider.   Denosumab injection What is this medicine? DENOSUMAB (den oh Candace mab) slows bone breakdown. Prolia is used to treat osteoporosis in women after menopause and in men. Delton See is used to prevent bone fractures and other bone problems caused by cancer bone metastases. Delton See is also used to treat giant cell tumor  of the bone. This medicine may be used for other purposes; ask your health care provider or pharmacist if you have questions. COMMON BRAND NAME(S): Prolia, XGEVA What should I tell my health care provider before I take this medicine? They need to know if you have any of these conditions: -dental disease -eczema -infection or history of infections -kidney disease or on dialysis -low blood calcium or vitamin D -malabsorption syndrome -scheduled to have surgery or tooth extraction -taking medicine that contains denosumab -thyroid or parathyroid disease -an unusual reaction to denosumab, other medicines, foods, dyes, or preservatives -pregnant or trying to get pregnant -breast-feeding How should I use this medicine? This medicine is for injection under the skin. It is given by a health care professional in a hospital or clinic setting. If you are getting Prolia, a special MedGuide will be given to you by the pharmacist with each prescription and refill. Be sure to read this information carefully each time. For Prolia, talk to your pediatrician regarding the use of this medicine in children. Special care may be needed. For Delton See, talk to your pediatrician regarding the use of this medicine in children. While this drug may be prescribed for children as young as 13 years for selected conditions, precautions do apply. Overdosage: If you think you've taken too much of this medicine contact a poison control center or emergency room at once. Overdosage: If you think you have taken too much of this medicine contact a poison control center or emergency room at once. NOTE: This medicine is only for you. Do not share this medicine with others. What if I miss a dose? It is important not to miss your dose. Call your doctor or health care professional if you are unable to keep an appointment. What may interact with this medicine? Do not take this medicine with any of the following medications: -other  medicines containing denosumab This medicine may also interact with the following medications: -medicines that suppress the immune system -medicines that treat cancer -steroid medicines like prednisone or cortisone This list may not describe all possible interactions. Give your health care provider a list of all the medicines, herbs, non-prescription drugs, or dietary supplements you use. Also tell them if you smoke, drink alcohol, or use illegal drugs. Some items may interact with your medicine. What should I watch for while using this medicine? Visit your doctor or health care professional for regular checks on your progress. Your doctor or health care professional may order blood tests and other tests to see how you are doing. Call your doctor or health care professional if you get a cold or other infection while receiving this medicine. Do not treat yourself. This medicine may decrease your body's ability to fight infection. You should make sure you get enough calcium and vitamin D while you are taking this medicine, unless your doctor tells you not to. Discuss the foods you eat and the vitamins you take with your health care professional. See your dentist regularly. Brush and floss your teeth as directed. Before you have any dental work done, tell your dentist you are receiving this medicine. Do not become pregnant while taking this medicine or for  5 months after stopping it. Women should inform their doctor if they wish to become pregnant or think they might be pregnant. There is a potential for serious side effects to an unborn child. Talk to your health care professional or pharmacist for more information. What side effects may I notice from receiving this medicine? Side effects that you should report to your doctor or health care professional as soon as possible: -allergic reactions like skin rash, itching or hives, swelling of the face, lips, or tongue -breathing problems -chest  pain -fast, irregular heartbeat -feeling faint or lightheaded, falls -fever, chills, or any other sign of infection -muscle spasms, tightening, or twitches -numbness or tingling -skin blisters or bumps, or is dry, peels, or red -slow healing or unexplained pain in the mouth or jaw -unusual bleeding or bruising Side effects that usually do not require medical attention (Report these to your doctor or health care professional if they continue or are bothersome.): -muscle pain -stomach upset, gas This list may not describe all possible side effects. Call your doctor for medical advice about side effects. You may report side effects to FDA at 1-800-FDA-1088. Where should I keep my medicine? This medicine is only given in a clinic, doctor's office, or other health care setting and will not be stored at home. NOTE: This sheet is a summary. It may not cover all possible information. If you have questions about this medicine, talk to your doctor, pharmacist, or health care provider.  2015, Elsevier/Gold Standard. (2011-09-30 12:37:47)

## 2014-11-11 NOTE — Progress Notes (Signed)
Alternating port lumens - proximal port used today.

## 2014-11-14 ENCOUNTER — Ambulatory Visit (HOSPITAL_BASED_OUTPATIENT_CLINIC_OR_DEPARTMENT_OTHER): Payer: BC Managed Care – PPO | Admitting: Nurse Practitioner

## 2014-11-14 ENCOUNTER — Encounter: Payer: Self-pay | Admitting: Nurse Practitioner

## 2014-11-14 ENCOUNTER — Other Ambulatory Visit: Payer: Self-pay | Admitting: Nurse Practitioner

## 2014-11-14 ENCOUNTER — Telehealth: Payer: Self-pay | Admitting: Internal Medicine

## 2014-11-14 ENCOUNTER — Ambulatory Visit (HOSPITAL_BASED_OUTPATIENT_CLINIC_OR_DEPARTMENT_OTHER): Payer: BC Managed Care – PPO

## 2014-11-14 VITALS — BP 101/59 | HR 95 | Temp 97.0°F | Resp 16

## 2014-11-14 DIAGNOSIS — C7931 Secondary malignant neoplasm of brain: Secondary | ICD-10-CM

## 2014-11-14 DIAGNOSIS — E860 Dehydration: Secondary | ICD-10-CM | POA: Diagnosis not present

## 2014-11-14 DIAGNOSIS — C3432 Malignant neoplasm of lower lobe, left bronchus or lung: Secondary | ICD-10-CM | POA: Diagnosis not present

## 2014-11-14 DIAGNOSIS — R110 Nausea: Secondary | ICD-10-CM | POA: Diagnosis not present

## 2014-11-14 DIAGNOSIS — C7951 Secondary malignant neoplasm of bone: Secondary | ICD-10-CM

## 2014-11-14 DIAGNOSIS — C3490 Malignant neoplasm of unspecified part of unspecified bronchus or lung: Secondary | ICD-10-CM

## 2014-11-14 MED ORDER — DEXAMETHASONE 4 MG PO TABS
4.0000 mg | ORAL_TABLET | ORAL | Status: AC
  Administered 2014-11-14: 4 mg via ORAL

## 2014-11-14 MED ORDER — DEXAMETHASONE 4 MG PO TABS: ORAL_TABLET | ORAL | Status: AC

## 2014-11-14 MED ORDER — HEPARIN SOD (PORK) LOCK FLUSH 100 UNIT/ML IV SOLN
500.0000 [IU] | Freq: Once | INTRAVENOUS | Status: AC
  Administered 2014-11-14: 500 [IU] via INTRAVENOUS
  Filled 2014-11-14: qty 5

## 2014-11-14 MED ORDER — SODIUM CHLORIDE 0.9 % IV SOLN
INTRAVENOUS | Status: DC
  Administered 2014-11-14: 14:00:00 via INTRAVENOUS

## 2014-11-14 MED ORDER — DEXAMETHASONE 4 MG PO TABS
ORAL_TABLET | ORAL | Status: AC
  Filled 2014-11-14: qty 1

## 2014-11-14 MED ORDER — SODIUM CHLORIDE 0.9 % IJ SOLN
10.0000 mL | INTRAMUSCULAR | Status: DC | PRN
  Administered 2014-11-14: 10 mL via INTRAVENOUS
  Filled 2014-11-14: qty 10

## 2014-11-14 MED ORDER — SODIUM CHLORIDE 0.9 % IV SOLN
Freq: Once | INTRAVENOUS | Status: DC
  Filled 2014-11-14: qty 4

## 2014-11-14 NOTE — Assessment & Plan Note (Signed)
Patient initiated Gilotrif oral therapy on 08/29/2014 at 40 mg per day.  She decreased the dose of the Gilotrif on 10/03/14 due to poor tolerability.  She continued to complain of significant diarrhea and mucositis; and was advised to decrease the Gilotrif to every other day.   Patient completed her radiation therapy to her left neck on 09/28/2014.  She  completed radiation treatments to her hips for known bone metastasis on 10/26/2014.  She obtained a restaging CT of her chest on 11/09/2014.  She will return for follow-up visit to review scan results on 11/17/2014.

## 2014-11-14 NOTE — Progress Notes (Signed)
Patient reports nausea upon arrival to infusion. She has taken compazine at home and "one that starts with an 'O' (believed to be ondansetron)" with no relief. Patient denies any allergies. OK per Selena Lesser to give 8 mg zofran IVPB. When RN noticed and questioned patient about allergy to zofran, listed as "hives at [facility]," the patient remembers she might have had hives previously but can't describe the episode. Patient reports she doesn't take zofran at home but rather another medication that starts with an "O" that is used for "mental cases" per patient. Ativan also on allergy list with similar hives description. Informed Cyndee, NP; hold zofran. NP to discuss with Dr. Julien Nordmann. Pharmacy notified.   4 mg decadron po NOW per Selena Lesser, NP after discussion with Dr. Julien Nordmann. Rx sent to pharmacy, instructions reviewed as well as possible side effects. Patient to take 1/2-1 tablet as needed BID. She verbalizes understanding.

## 2014-11-14 NOTE — Telephone Encounter (Signed)
Pt notified of IVF appt.  She also stated she continues to vomit despite zyprexa and comapzine . Last time 5 pm. All of a sudden she vomits.

## 2014-11-14 NOTE — Assessment & Plan Note (Signed)
Patient continues to complain of chronic nausea; but no vomiting.  She admits to very poor oral intake.  She states that she has been taking Compazine at home with minimal effectiveness.  She states that taking either Ativan or Zofran breaks her out in hives.  Patient received IV fluid rehydration while at the cancer Center today.  Will prescribe patient dexamethasone tablets to try for control of her nausea.  Patient was advised she may take half a tablet or a whole tablet (2 mg - 4 mg) twice daily on an as-needed basis for nausea.  Also, advised patient to hold Gilotrif chemotherapy agent for the time being; to see if that helps with her nausea.  Patient has requested to return this coming Wednesday, 11/16/2014 for additional IV fluid rehydration.

## 2014-11-14 NOTE — Patient Instructions (Signed)

## 2014-11-14 NOTE — Assessment & Plan Note (Signed)
Patient complaining of minimal appetite; although both her diarrhea and her mucositis have greatly improved..  Patient continues to complain of chronic nausea; but no vomiting.  Patient appears dehydrated today.  Patient was encouraged to eat multiple small meals a day; and to push fluids as much as possible. Patient will receive IV fluid rehydration today while cancer Center; and will also return per patient's request this coming Wednesday 11/16/14 for additional IV fluids.

## 2014-11-14 NOTE — Progress Notes (Signed)
[facility]EMENT [facility]   HPI: Sherri Stafford 64 y.o. 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  Currently undergoing oral Gilotrif chemotherapy.  Pt completed palliative radiation to her hips last week.   Patient continues to complain of chronic nausea; but no vomiting.  She admits to very poor oral intake.  She states that she has been taking Compazine at home with minimal effectiveness.  She states that taking either Ativan or Zofran breaks her out in hives.  Patient denies any recent fevers or chills.  Diarrhea     Review of Systems  Gastrointestinal: Positive for diarrhea.    Past Medical History  Diagnosis Date  . Arthritis   . Bilateral ovarian cysts   . GERD (gastroesophageal reflux disease)   . Strain of hip flexor 06/2013    left side torn  . Radiation     at [facility] to left hip, sacrum and brain  . Radiation 02/10/14-03/06/14    left central chest 35 gray  . Encounter for antineoplastic chemotherapy 09/19/2014  . Cancer     lung ca  . Bone metastases     to left hip and spine    Past Surgical History  Procedure Laterality Date  . Dilation and curettage of uterus  2004    x 3   . Tubal ligation    . Colonoscopy  04/05/2004    normal     has Nonspecific abnormal results of liver function study; Cough; Sarcoidosis; Unspecified vitamin D deficiency; Dyspnea; Adenocarcinoma of lung; Chest pain; Pulmonary embolism; Acute bronchitis; Bone metastases; Abdominal pain; Nausea without vomiting; Long term current use of anticoagulant therapy; Dysuria; Dehydration; Hyperbilirubinemia; Hypoalbuminemia; Neoplasm related pain; Anemia in neoplastic disease; Itching; Constipation; Encounter for antineoplastic chemotherapy; Mucositis due to chemotherapy; Diarrhea; and Anorexia on her problem list.    is allergic to other; ketoconazole; lorazepam; morphine and related; nystatin; sporanox; tylenol; vitamin d; and zofran.    Medication List        This list is accurate as of: 11/14/14  5:48 PM.  Always use your most recent med list.               afatinib dimaleate 30 MG tablet  Commonly known as:  GILOTRIF  Take 1 tablet (30 mg total) by mouth daily. Take on an empty stomach 1hr before or 2 hrs after meals.     albuterol 108 (90 BASE) MCG/ACT inhaler  Commonly known as:  PROVENTIL HFA;VENTOLIN HFA  Inhale 1-2 puffs into the lungs every 6 (six) hours as needed for wheezing or shortness of breath.     bisacodyl 10 MG suppository  Commonly known as:  DULCOLAX  Place 1 suppository (10 mg total) rectally daily as needed for moderate constipation.     calcium carbonate 1250 (500 CA) MG chewable tablet  Commonly known as:  OS-CAL  Chew 4 tablets by mouth daily.     clindamycin 1 % external solution  Commonly known as:  CLEOCIN-T  Apply topically 2 (two) times daily.     [employer] SENNA PLUS 8.6-50 MG per tablet  Generic drug:  senna-docusate     dexamethasone 4 MG tablet  Commonly known as:  DECADRON  May take 1/2 to 1 tab PO BID PRN nausea.     diphenhydrAMINE 25 MG tablet  Commonly known as:  SOMINEX  Take 25 mg by mouth at bedtime as needed for sleep. Takes 2 tablets every 3-4 hours during the  night.     diphenoxylate-atropine 2.5-0.025 MG per tablet  Commonly known as:  LOMOTIL  Take 2 tablets by mouth 4 (four) times daily as needed for diarrhea or loose stools.     docusate sodium 100 MG capsule  Commonly known as:  COLACE  Take 100 mg by mouth 2 (two) times daily.     fentaNYL 50 MCG/HR  Commonly known as:  DURAGESIC - dosed mcg/hr  Place 1 patch (50 mcg total) onto the skin every 3 (three) days.     glycerin adult 2 G Supp  Place 1 suppository rectally once as needed for moderate constipation.     magic mouthwash w/lidocaine Soln  Take 5 mLs by mouth 4 (four) times daily as needed for mouth pain.     naproxen sodium 220 MG tablet  Commonly known as:  ANAPROX  Take 220 mg by mouth 2 (two) times daily  with a meal.     OLANZapine zydis 10 MG disintegrating tablet  Commonly known as:  ZYPREXA ZYDIS  For nausea     polyethylene glycol packet  Commonly known as:  MIRALAX / GLYCOLAX  Take 17 g by mouth daily as needed for moderate constipation.     prochlorperazine 10 MG tablet  Commonly known as:  COMPAZINE  Take 1 tablet (10 mg total) by mouth every 6 (six) hours as needed for nausea or vomiting.     rivaroxaban 20 MG Tabs tablet  Commonly known as:  XARELTO  Take 1 tablet (20 mg total) by mouth daily with supper.     sucralfate 1 GM/10ML suspension  Commonly known as:  CARAFATE  Take 10 mLs (1 g total) by mouth 4 (four) times daily -  with meals and at bedtime.     traMADol 50 MG tablet  Commonly known as:  ULTRAM  TAKE 1 TABLET BY MOUTH EVERY 6 HOURS AS NEEDED         PHYSICAL EXAMINATION  Oncology Vitals 11/14/2014 11/14/2014 11/11/2014 11/09/2014 11/09/2014 11/07/2014 11/04/2014  Height - - - - - - -  Weight - - - - - - -  Weight (lbs) - - - - - - -  BMI (kg/m2) - - - - - - -  Temp - 97 97.9 98.4 98.6 98.2 98.5  Pulse 95 116 118 92 96 94 117  Resp '16 18 16 18 18 18 24  ' SpO2 100 - 100 99 100 100 100  BSA (m2) - - - - - - -   BP Readings from Last 3 Encounters:  11/14/14 101/59  11/11/14 111/72  11/09/14 108/65    Physical Exam  Constitutional: She is oriented to person, place, and time.  Patient appears fatigued, weak, frail, and chronically ill.  HENT:  Head: Normocephalic and atraumatic.  Mild erythema to time and lower gum region; just very tender.  Eyes: Conjunctivae and EOM are normal. Pupils are equal, round, and reactive to light. Right eye exhibits no discharge. Left eye exhibits no discharge. No scleral icterus.  Neck: Normal range of motion.  Pulmonary/Chest: Effort normal and breath sounds normal. No stridor. No respiratory distress.  Musculoskeletal: Normal range of motion.  Neurological: She is alert and oriented to person, place, and time.  Patient  in wheelchair during exam.  Skin: There is pallor.  Psychiatric: Affect normal.  Nursing note and vitals reviewed.   LABORATORY DATA:. No visits with results within 3 Day(s) from this visit. Latest known visit with results is:  Appointment on  11/09/2014  Component Date Value Ref Range Status  . WBC 11/09/2014 4.5  3.9 - 10.3 10e3/uL Final  . NEUT# 11/09/2014 3.5  1.5 - 6.5 10e3/uL Final  . HGB 11/09/2014 9.8* 11.6 - 15.9 g/dL Final  . HCT 11/09/2014 30.2* 34.8 - 46.6 % Final  . Platelets 11/09/2014 139* 145 - 400 10e3/uL Final  . MCV 11/09/2014 80.9  79.5 - 101.0 fL Final  . MCH 11/09/2014 26.2  25.1 - 34.0 pg Final  . MCHC 11/09/2014 32.3  31.5 - 36.0 g/dL Final  . RBC 11/09/2014 3.73  3.70 - 5.45 10e6/uL Final  . RDW 11/09/2014 17.0* 11.2 - 14.5 % Final  . lymph# 11/09/2014 0.3* 0.9 - 3.3 10e3/uL Final  . MONO# 11/09/2014 0.4  0.1 - 0.9 10e3/uL Final  . Eosinophils Absolute 11/09/2014 0.3  0.0 - 0.5 10e3/uL Final  . Basophils Absolute 11/09/2014 0.0  0.0 - 0.1 10e3/uL Final  . NEUT% 11/09/2014 78.7* 38.4 - 76.8 % Final  . LYMPH% 11/09/2014 6.0* 14.0 - 49.7 % Final  . MONO% 11/09/2014 8.1  0.0 - 14.0 % Final  . EOS% 11/09/2014 6.9  0.0 - 7.0 % Final  . BASO% 11/09/2014 0.3  0.0 - 2.0 % Final  . Sodium 11/09/2014 134* 136 - 145 mEq/L Final  . Potassium 11/09/2014 4.0  3.5 - 5.1 mEq/L Final  . Chloride 11/09/2014 102  98 - 109 mEq/L Final  . CO2 11/09/2014 22  22 - 29 mEq/L Final  . Glucose 11/09/2014 95  70 - 140 mg/dl Final  . BUN 11/09/2014 11.7  7.0 - 26.0 mg/dL Final  . Creatinine 11/09/2014 0.7  0.6 - 1.1 mg/dL Final  . Total Bilirubin 11/09/2014 1.34* 0.20 - 1.20 mg/dL Final  . Alkaline Phosphatase 11/09/2014 75  40 - 150 U/L Final  . AST 11/09/2014 14  5 - 34 U/L Final  . ALT 11/09/2014 11  0 - 55 U/L Final  . Total Protein 11/09/2014 6.2* 6.4 - 8.3 g/dL Final  . Albumin 11/09/2014 3.0* 3.5 - 5.0 g/dL Final  . Calcium 11/09/2014 8.4  8.4 - 10.4 mg/dL Final  . Anion  Gap 11/09/2014 10  3 - 11 mEq/L Final  . EGFR 11/09/2014 >90  >90 ml/min/1.73 m2 Final   eGFR is calculated using the CKD-EPI Creatinine Equation (2009)     RADIOGRAPHIC STUDIES: No results found.  ASSESSMENT/PLAN:    Adenocarcinoma of lung Patient initiated Gilotrif oral therapy on 08/29/2014 at 40 mg per day.  She decreased the dose of the Gilotrif on 10/03/14 due to poor tolerability.  She continued to complain of significant diarrhea and mucositis; and was advised to decrease the Gilotrif to every other day.   Patient completed her radiation therapy to her left neck on 09/28/2014.  She  completed radiation treatments to her hips for known bone metastasis on 10/26/2014.  She obtained a restaging CT of her chest on 11/09/2014.  She will return for follow-up visit to review scan results on 11/17/2014.                   Dehydration Patient complaining of minimal appetite; although both her diarrhea and her mucositis have greatly improved..  Patient continues to complain of chronic nausea; but no vomiting.  Patient appears dehydrated today.  Patient was encouraged to eat multiple small meals a day; and to push fluids as much as possible. Patient will receive IV fluid rehydration today while cancer Center; and will also return per patient's  request this coming Wednesday 11/16/14 for additional IV fluids.          Nausea without vomiting Patient continues to complain of chronic nausea; but no vomiting.  She admits to very poor oral intake.  She states that she has been taking Compazine at home with minimal effectiveness.  She states that taking either Ativan or Zofran breaks her out in hives.  Patient received IV fluid rehydration while at the cancer Center today.  Will prescribe patient dexamethasone tablets to try for control of her nausea.  Patient was advised she may take half a tablet or a whole tablet (2 mg - 4 mg) twice daily on an as-needed basis for  nausea.  Also, advised patient to hold Gilotrif chemotherapy agent for the time being; to see if that helps with her nausea.  Patient has requested to return this coming Wednesday, 11/16/2014 for additional IV fluid rehydration.     Patient stated understanding of all instructions; and was in agreement with this plan of care. The patient knows to call the clinic with any problems, questions or concerns.   Review details of today's visit with Dr. Earlie Server.  Total time spent with patient was 25 minutes;  with greater than 75 percent of that time spent in face to face counseling regarding patient's symptoms,  and coordination of care and follow up.  Disclaimer: This note was dictated with voice recognition software. Similar sounding words can inadvertently be transcribed and may not be corrected upon review.   Drue Second, NP 11/14/2014

## 2014-11-14 NOTE — Telephone Encounter (Signed)
Sherri Stafford and advised on 8.3 and 8.5 appts.Marland KitchenMarland KitchenMarland KitchenMarland Kitchenpt ok and aware

## 2014-11-15 ENCOUNTER — Telehealth: Payer: Self-pay | Admitting: Internal Medicine

## 2014-11-15 NOTE — Telephone Encounter (Signed)
lvm for  pt advised on added lab b4 ivf on 8.3

## 2014-11-15 NOTE — Telephone Encounter (Signed)
returned call and s.w pt and confirmed appts....pt ok and aware °

## 2014-11-16 ENCOUNTER — Telehealth: Payer: Self-pay

## 2014-11-16 ENCOUNTER — Other Ambulatory Visit (HOSPITAL_BASED_OUTPATIENT_CLINIC_OR_DEPARTMENT_OTHER): Payer: BC Managed Care – PPO

## 2014-11-16 ENCOUNTER — Ambulatory Visit (HOSPITAL_BASED_OUTPATIENT_CLINIC_OR_DEPARTMENT_OTHER): Payer: BC Managed Care – PPO

## 2014-11-16 ENCOUNTER — Ambulatory Visit (HOSPITAL_COMMUNITY)
Admission: RE | Admit: 2014-11-16 | Discharge: 2014-11-16 | Disposition: A | Discharge status: Home / Self Care | Payer: BC Managed Care – PPO | Source: Ambulatory Visit | Attending: Internal Medicine | Admitting: Internal Medicine

## 2014-11-16 DIAGNOSIS — C7931 Secondary malignant neoplasm of brain: Secondary | ICD-10-CM | POA: Diagnosis not present

## 2014-11-16 DIAGNOSIS — C3432 Malignant neoplasm of lower lobe, left bronchus or lung: Secondary | ICD-10-CM | POA: Diagnosis not present

## 2014-11-16 DIAGNOSIS — C3490 Malignant neoplasm of unspecified part of unspecified bronchus or lung: Secondary | ICD-10-CM | POA: Insufficient documentation

## 2014-11-16 DIAGNOSIS — C7951 Secondary malignant neoplasm of bone: Secondary | ICD-10-CM

## 2014-11-16 DIAGNOSIS — E860 Dehydration: Secondary | ICD-10-CM

## 2014-11-16 DIAGNOSIS — R110 Nausea: Secondary | ICD-10-CM | POA: Diagnosis not present

## 2014-11-16 LAB — CBC WITH DIFFERENTIAL/PLATELET
BASO%: 0.4 % (ref 0.0–2.0)
Basophils Absolute: 0 10*3/uL (ref 0.0–0.1)
EOS ABS: 0.3 10*3/uL (ref 0.0–0.5)
EOS%: 3.9 % (ref 0.0–7.0)
HEMATOCRIT: 30.8 % — AB (ref 34.8–46.6)
HEMOGLOBIN: 10 g/dL — AB (ref 11.6–15.9)
LYMPH#: 0.5 10*3/uL — AB (ref 0.9–3.3)
LYMPH%: 7.9 % — ABNORMAL LOW (ref 14.0–49.7)
MCH: 26.2 pg (ref 25.1–34.0)
MCHC: 32.3 g/dL (ref 31.5–36.0)
MCV: 81.3 fL (ref 79.5–101.0)
MONO#: 0.3 10*3/uL (ref 0.1–0.9)
MONO%: 4.9 % (ref 0.0–14.0)
NEUT%: 82.9 % — AB (ref 38.4–76.8)
NEUTROS ABS: 5.4 10*3/uL (ref 1.5–6.5)
Platelets: 214 10*3/uL (ref 145–400)
RBC: 3.79 10*6/uL (ref 3.70–5.45)
RDW: 17.7 % — AB (ref 11.2–14.5)
WBC: 6.6 10*3/uL (ref 3.9–10.3)

## 2014-11-16 LAB — COMPREHENSIVE METABOLIC PANEL (CC13)
ALK PHOS: 78 U/L (ref 40–150)
ALT: 9 U/L (ref 0–55)
AST: 10 U/L (ref 5–34)
Albumin: 3.1 g/dL — ABNORMAL LOW (ref 3.5–5.0)
Anion Gap: 7 mEq/L (ref 3–11)
BUN: 11.3 mg/dL (ref 7.0–26.0)
CHLORIDE: 105 meq/L (ref 98–109)
CO2: 24 mEq/L (ref 22–29)
Calcium: 8.5 mg/dL (ref 8.4–10.4)
Creatinine: 0.7 mg/dL (ref 0.6–1.1)
EGFR: >90 mL/min/{1.73_m2} (ref >90)
GLUCOSE: 139 mg/dL (ref 70–140)
POTASSIUM: 3.9 meq/L (ref 3.5–5.1)
Sodium: 136 mEq/L (ref 136–145)
TOTAL PROTEIN: 6.1 g/dL — AB (ref 6.4–8.3)
Total Bilirubin: 0.72 mg/dL (ref 0.20–1.20)

## 2014-11-16 MED ORDER — SODIUM CHLORIDE 0.9 % IV SOLN
INTRAVENOUS | Status: DC
  Administered 2014-11-16: 09:00:00 via INTRAVENOUS

## 2014-11-16 MED ORDER — HEPARIN SOD (PORK) LOCK FLUSH 100 UNIT/ML IV SOLN
500.0000 [IU] | INTRAVENOUS | Status: AC | PRN
  Administered 2014-11-16: 500 [IU]
  Filled 2014-11-16: qty 5

## 2014-11-16 MED ORDER — SODIUM CHLORIDE 0.9 % IJ SOLN
10.0000 mL | INTRAMUSCULAR | Status: AC | PRN
  Administered 2014-11-16: 10 mL via INTRAVENOUS
  Filled 2014-11-16: qty 10

## 2014-11-16 MED ORDER — SODIUM CHLORIDE 0.9 % IJ SOLN
10.0000 mL | INTRAMUSCULAR | Status: AC | PRN
  Administered 2014-11-16: 10 mL

## 2014-11-16 MED ORDER — SODIUM CHLORIDE 0.9 % IV SOLN
INTRAVENOUS | Status: DC
Start: 2014-11-16 — End: 2014-11-16

## 2014-11-16 NOTE — Patient Instructions (Signed)

## 2014-11-16 NOTE — Telephone Encounter (Signed)
lvm that labs were OK today, fluids only on Friday. She has appt with Dr Julien Nordmann tomorrow.

## 2014-11-16 NOTE — Procedures (Signed)
[facility]  Procedure Note  Sherri Stafford OEV:[id number] DOB: 08-Apr-1951 DOA: 11/16/2014   PCP: [name], MD   Associated Diagnosis: Bone metastases (198.5)  Procedure Note:  IV infusion of 1 liter NS   Condition During Procedure: Pt tolerated well; no complications noted   Condition at Discharge:  Pt accompanied by family; pt alert, oriented, no complications noted   Nigel Sloop, [facility]

## 2014-11-17 ENCOUNTER — Ambulatory Visit (HOSPITAL_BASED_OUTPATIENT_CLINIC_OR_DEPARTMENT_OTHER): Payer: BC Managed Care – PPO | Admitting: Internal Medicine

## 2014-11-17 ENCOUNTER — Telehealth: Payer: Self-pay | Admitting: Internal Medicine

## 2014-11-17 ENCOUNTER — Other Ambulatory Visit: Payer: Self-pay | Admitting: Neurology

## 2014-11-17 ENCOUNTER — Encounter: Payer: Self-pay | Admitting: Oncology

## 2014-11-17 ENCOUNTER — Telehealth: Payer: Self-pay | Admitting: *Deleted

## 2014-11-17 ENCOUNTER — Other Ambulatory Visit: Payer: Self-pay | Admitting: *Deleted

## 2014-11-17 VITALS — BP 105/66 | HR 115 | Temp 98.6°F | Resp 17 | Ht 64.0 in | Wt 111.9 lb

## 2014-11-17 DIAGNOSIS — C7931 Secondary malignant neoplasm of brain: Secondary | ICD-10-CM | POA: Diagnosis not present

## 2014-11-17 DIAGNOSIS — C7951 Secondary malignant neoplasm of bone: Secondary | ICD-10-CM | POA: Diagnosis not present

## 2014-11-17 DIAGNOSIS — C787 Secondary malignant neoplasm of liver and intrahepatic bile duct: Secondary | ICD-10-CM | POA: Diagnosis not present

## 2014-11-17 DIAGNOSIS — Z7901 Long term (current) use of anticoagulants: Secondary | ICD-10-CM

## 2014-11-17 DIAGNOSIS — C3490 Malignant neoplasm of unspecified part of unspecified bronchus or lung: Secondary | ICD-10-CM

## 2014-11-17 DIAGNOSIS — C3432 Malignant neoplasm of lower lobe, left bronchus or lung: Secondary | ICD-10-CM | POA: Diagnosis not present

## 2014-11-17 DIAGNOSIS — C7889 Secondary malignant neoplasm of other digestive organs: Secondary | ICD-10-CM

## 2014-11-17 DIAGNOSIS — I2699 Other pulmonary embolism without acute cor pulmonale: Secondary | ICD-10-CM

## 2014-11-17 DIAGNOSIS — Z5111 Encounter for antineoplastic chemotherapy: Secondary | ICD-10-CM

## 2014-11-17 NOTE — Telephone Encounter (Signed)
Pt confirmed labs/ov per 08/04 POF, gave pt avs and calendar.... KJ, sent msg to add chem new treatment

## 2014-11-17 NOTE — Progress Notes (Signed)
[place] Telephone:([phone]) [phone]   Fax:(336) [phone]  OFFICE PROGRESS NOTE  [name], MD [place] [address]  DIAGNOSIS: stage IV (T2a, N3, M1b) non-small cell lung cancer, adenocarcinoma presented with large left lower lobe lung mass in addition to bilateral pulmonary nodules and mediastinal and supraclavicular lymphadenopathy diagnosed in May of 2015.  Genomic Alterations Identified? ERBB3 amplification CDK4 amplification IDH2 R172S KRAS G12D MDM2 amplification RBM10 [id number]*36 TERC amplification - equivocal? Additional Disease-relevant Genes with No Reportable Alterations Identified? RET ALK BRAF ERBB2 MET EGFR  PRIOR THERAPY:  1) status post palliative radiotherapy to the left hip between 06/12 to 09/30/2013 [facility]  status post stereotactic radiotherapy to brain lesions under the care of Dr. [place] 11/17/2013 2) Systemic chemotherapy with carboplatin for AUC of 5, Alimta 500 mg/M2 and Avastin 15 mg/KG every 3 weeks, status post 5 cycles. [facility] 3) immunotherapy with Nivolmab at 3 mg/kg given every 3 weeks. Status post 16 cycles discontinued today secondary to disease progression. 4)  Gilotrif 40 mg by mouth daily for a patient with positive ERBB3 amplification. Therapy starting 08/29/2014. The dose was reduced to 30 mg every other day secondary to interval recurrence. Status post approximately 2 month of therapy. Discontinued today secondary to disease progression.  CURRENT THERAPY:  1) Xgeva 120 mcg subcutaneously every 2 months.  INTERVAL HISTORY: Sherri HOELZEL64y.o. female returns to the clinic today for follow-up visit accompanied by her boyfriend. The patient denied having any significant complaints today except for the generalized  fatigue. Her fatigue has significantly improved after the patient was started on Decadron. Her pain in the left hip area has significantly improved after the palliative radiotherapy. She completed 2 cycles of interrupted treatment with Gilotrif. She has rough time tolerating this treatment with frequent episodes of diarrhea, stomatitis as well as nausea and vomiting. She denied having any significant chest pain, shortness of breath, cough or hemoptysis. The patient denied having any current fever or chills. She had repeat CT scan of the chest, abdomen and pelvis performed recently and she is here for evaluation and discussion of her scan results.  MEDICAL HISTORY: Past Medical History  Diagnosis Date  . Arthritis   . Bilateral ovarian cysts   . GERD (gastroesophageal reflux disease)   . Strain of hip flexor 06/2013    left side torn  . Radiation     at [facility] to left hip, sacrum and brain  . Radiation 02/10/14-03/06/14    left central chest 35 gray  . Encounter for antineoplastic chemotherapy 09/19/2014  . Cancer     lung ca  . Bone metastases     to left hip and spine  . Radiation 10/12/14-10/26/14    pelvis region 20 gray    ALLERGIES:  is allergic to other; ketoconazole; lorazepam; morphine and related; nystatin; sporanox; tylenol; vitamin d; and zofran.  MEDICATIONS:  Current Outpatient Prescriptions  Medication Sig Dispense Refill  . afatinib dimaleate (GILOTRIF) 30 MG tablet Take 1 tablet (30 mg total) by mouth daily. Take on an empty stomach 1hr before or 2 hrs after meals. 30 tablet 1  . albuterol (PROVENTIL HFA;VENTOLIN HFA) 108 (90 BASE) MCG/ACT inhaler Inhale 1-2 puffs into the lungs every 6 (six) hours as needed for wheezing or shortness of breath. (Patient not  taking: Reported on 10/31/2014) 1 Inhaler 2  . Alum & Mag Hydroxide-Simeth (MAGIC MOUTHWASH W/LIDOCAINE) SOLN Take 5 mLs by mouth 4 (four) times daily as needed for mouth pain. (Patient not taking: Reported on 10/31/2014) 240  mL 1  . bisacodyl (DULCOLAX) 10 MG suppository Place 1 suppository (10 mg total) rectally daily as needed for moderate constipation. (Patient not taking: Reported on 10/31/2014) 10 suppository 0  . calcium carbonate (OS-CAL) 1250 MG chewable tablet Chew 4 tablets by mouth daily.    . clindamycin (CLEOCIN-T) 1 % external solution Apply topically 2 (two) times daily. (Patient not taking: Reported on 10/31/2014) 30 mL 0  . [employer] SENNA PLUS 8.6-50 MG per tablet   11  . dexamethasone (DECADRON) 4 MG tablet May take 1/2 to 1 tab PO BID PRN nausea. 30 tablet 1  . diphenhydrAMINE (SOMINEX) 25 MG tablet Take 25 mg by mouth at bedtime as needed for sleep. Takes 2 tablets every 3-4 hours during the night.    . diphenoxylate-atropine (LOMOTIL) 2.5-0.025 MG per tablet Take 2 tablets by mouth 4 (four) times daily as needed for diarrhea or loose stools. (Patient not taking: Reported on 10/31/2014) 30 tablet 0  . docusate sodium (COLACE) 100 MG capsule Take 100 mg by mouth 2 (two) times daily.    . fentaNYL (DURAGESIC - DOSED MCG/HR) 50 MCG/HR Place 1 patch (50 mcg total) onto the skin every 3 (three) days. 10 patch 0  . glycerin adult 2 G SUPP Place 1 suppository rectally once as needed for moderate constipation.    . naproxen sodium (ANAPROX) 220 MG tablet Take 220 mg by mouth 2 (two) times daily with a meal.    . OLANZapine zydis (ZYPREXA ZYDIS) 10 MG disintegrating tablet For nausea 30 tablet 0  . polyethylene glycol (MIRALAX / GLYCOLAX) packet Take 17 g by mouth daily as needed for moderate constipation.    . prochlorperazine (COMPAZINE) 10 MG tablet Take 1 tablet (10 mg total) by mouth every 6 (six) hours as needed for nausea or vomiting. (Patient not taking: Reported on 10/31/2014) 45 tablet 2  . rivaroxaban (XARELTO) 20 MG TABS tablet Take 1 tablet (20 mg total) by mouth daily with supper. 30 tablet 5  . sucralfate (CARAFATE) 1 GM/10ML suspension Take 10 mLs (1 g total) by mouth 4 (four) times daily -  with meals  and at bedtime. (Patient not taking: Reported on 10/31/2014) 420 mL 0  . traMADol (ULTRAM) 50 MG tablet TAKE 1 TABLET BY MOUTH EVERY 6 HOURS AS NEEDED (Patient not taking: Reported on 10/31/2014) 60 tablet 0   No current facility-administered medications for this visit.   Facility-Administered Medications Ordered in Other Visits  Medication Dose Route Frequency Provider Last Rate Last Dose  . sodium chloride 0.9 % injection 10 mL  10 mL Intracatheter PRN Curt Bears, MD   10 mL at 04/07/14 1200  . sodium chloride 0.9 % injection 10 mL  10 mL Intravenous PRN Curt Bears, MD   10 mL at 11/16/14 0850    SURGICAL HISTORY:  Past Surgical History  Procedure Laterality Date  . Dilation and curettage of uterus  2004    x 3   . Tubal ligation    . Colonoscopy  04/05/2004    normal     REVIEW OF SYSTEMS:  Constitutional: positive for anorexia, fatigue and weight loss Eyes: negative Ears, nose, mouth, throat, and face: negative Respiratory: negative Cardiovascular: negative Gastrointestinal: positive for nausea Genitourinary:negative Integument/breast: negative Hematologic/lymphatic: negative Musculoskeletal:negative  Neurological: negative Behavioral/Psych: negative Endocrine: negative Allergic/Immunologic: negative   PHYSICAL EXAMINATION: General appearance: alert, cooperative, fatigued and no distress Head: Normocephalic, without obvious abnormality, atraumatic Neck: no adenopathy, no JVD, supple, symmetrical, trachea midline and thyroid not enlarged, symmetric, no tenderness/mass/nodules Lymph nodes: Cervical, supraclavicular, and axillary nodes normal. Resp: clear to auscultation bilaterally Back: symmetric, no curvature. ROM normal. No CVA tenderness. Cardio: regular rate and rhythm, S1, S2 normal, no murmur, click, rub or gallop GI: soft, non-tender; bowel sounds normal; no masses,  no organomegaly Extremities: extremities normal, atraumatic, no cyanosis or  edema Neurologic: Alert and oriented X 3, normal strength and tone. Normal symmetric reflexes. Normal coordination and gait  ECOG PERFORMANCE STATUS: 1 - Symptomatic but completely ambulatory  Blood pressure 105/66, pulse 115, temperature 98.6 F (37 C), temperature source Oral, resp. rate 17, height '5\' 4"'  (1.626 m), weight 111 lb 14.4 oz (50.758 kg), last menstrual period 01/14/2008, SpO2 98 %.  LABORATORY DATA: Lab Results  Component Value Date   WBC 6.6 11/16/2014   HGB 10.0* 11/16/2014   HCT 30.8* 11/16/2014   MCV 81.3 11/16/2014   PLT 214 11/16/2014      Chemistry      Component Value Date/Time   NA 136 11/16/2014 0829   NA 137 07/28/2014 0916   K 3.9 11/16/2014 0829   K 4.4 07/28/2014 0916   CL 104 07/28/2014 0916   CO2 24 11/16/2014 0829   CO2 28 07/28/2014 0916   BUN 11.3 11/16/2014 0829   BUN 13 07/28/2014 0916   CREATININE 0.7 11/16/2014 0829   CREATININE 0.85 07/28/2014 0916   CREATININE 0.83 09/07/2013 1506      Component Value Date/Time   CALCIUM 8.5 11/16/2014 0829   CALCIUM 8.8 07/28/2014 0916   ALKPHOS 78 11/16/2014 0829   ALKPHOS 49 07/28/2014 0916   AST 10 11/16/2014 0829   AST 21 07/28/2014 0916   ALT 9 11/16/2014 0829   ALT 12 07/28/2014 0916   BILITOT 0.72 11/16/2014 0829   BILITOT 0.9 07/28/2014 0916       RADIOGRAPHIC STUDIES: Ct Chest W Contrast  11/09/2014   CLINICAL DATA:  Lung cancer diagnosed 08/25/2013. Metastatic disease to bone. Chemotherapy in progress. Radiation therapy complete 2015. Subsequent treatment strategy.  EXAM: CT CHEST, ABDOMEN, AND PELVIS WITH CONTRAST  TECHNIQUE: Multidetector CT imaging of the chest, abdomen and pelvis was performed following the standard protocol during bolus administration of intravenous contrast.  CONTRAST:  69m OMNIPAQUE IOHEXOL 300 MG/ML  SOLN  COMPARISON:  CT 08/22/2014  FINDINGS: CT CHEST FINDINGS  Mediastinum/Nodes: No axillary lymphadenopathy. Left supraclavicular lymph node is decreased in  volume measuring 16 mm on image 8 series 2 compared 35 mm. No mediastinal adenopathy. No pericardial fluid.  Lungs/Pleura: There is a moderate left pleural effusion. Within the left lower lobe there is a focus of rounded atelectasis with low-density lesions within the atelectatic lung. This rounded lung measures 3.9 cm compared to 3.9 cm on prior.  There are bilateral pulmonary metastasis again demonstrated. Example nodule in the left upper lobe measures 7 mm on image 15, series 4 compared to 9 mm on prior. Left upper lobe nodule measures 15 mm on image 25 compared to 13 mm. Right middle lobe nodule measures 7 mm compared to 7 mm.  CT ABDOMEN AND PELVIS FINDINGS  Hepatobiliary: Unfortunately, there is progression of hepatic metastasis. Cluster of 4 new round low-density lesions in the dome of the right hepatic lobe are seen on image 46, series 2.  These 4 lesions range in size from 10-16 mm. Additional new lesion in the lateral right hepatic lobe measures 19 mm on image 50.  The gallbladder is normal.  Common bile duct is normal caliber.  Pancreas: The pancreas appears normal. There is bulky periportal adenopathy adjacent to the pancreas.  Spleen: Interval increase in volume of splenic metastasis. Largest lesion measures 7.6 cm compared to 3.5 cm. The spleen overall is increased in volume with multiple new lesions superiorly within the spleen.  Adrenals/urinary tract: Adrenal glands and kidneys are normal. The ureters and bladder normal.  Stomach/Bowel: Stomach, small bowel, appendix, and cecum are normal. The colon and rectosigmoid colon are normal.  Vascular/Lymphatic: Abdominal aorta demonstrates intimal calcification.  Bulky periportal metastatic adenopathy is mildly increased. For example 20 mm short axis lymph node on image 58 is increased 16 mm. There is left periaortic adenopathy similar to prior with a 14 mm short axis node on image 70 compared to 14 mm.  Reproductive: Uterus is normal.  No adnexal  abnormality.  Musculoskeletal: Stable mixed lytic and sclerotic lesions in the spine and pelvis.  Other: No free fluid.  IMPRESSION: Chest Impression:  1. Stable pulmonary metastasis. 2. Stable left lower lobe mass within the atelectatic lung. 3. Stable left pleural effusion. 4. Decrease in size of left supraclavicular lymph node.  Abdomen / Pelvis Impression:  1. Dramatic increase in volume of splenic metastasis with enlargement of spleen and multiple new lesions. 2. Multiple new hepatic metastasis primarily in the right hepatic lobe. 3. Mild increase in periportal adenopathy and stable retroperitoneal adenopathy. 4. Stable skeletal metastasis.  No evidence of fracture.   Electronically Signed   By: Suzy Bouchard M.D.   On: 11/09/2014 10:04   Ct Abdomen Pelvis W Contrast  11/09/2014   CLINICAL DATA:  Lung cancer diagnosed 08/25/2013. Metastatic disease to bone. Chemotherapy in progress. Radiation therapy complete 2015. Subsequent treatment strategy.  EXAM: CT CHEST, ABDOMEN, AND PELVIS WITH CONTRAST  TECHNIQUE: Multidetector CT imaging of the chest, abdomen and pelvis was performed following the standard protocol during bolus administration of intravenous contrast.  CONTRAST:  66m OMNIPAQUE IOHEXOL 300 MG/ML  SOLN  COMPARISON:  CT 08/22/2014  FINDINGS: CT CHEST FINDINGS  Mediastinum/Nodes: No axillary lymphadenopathy. Left supraclavicular lymph node is decreased in volume measuring 16 mm on image 8 series 2 compared 35 mm. No mediastinal adenopathy. No pericardial fluid.  Lungs/Pleura: There is a moderate left pleural effusion. Within the left lower lobe there is a focus of rounded atelectasis with low-density lesions within the atelectatic lung. This rounded lung measures 3.9 cm compared to 3.9 cm on prior.  There are bilateral pulmonary metastasis again demonstrated. Example nodule in the left upper lobe measures 7 mm on image 15, series 4 compared to 9 mm on prior. Left upper lobe nodule measures 15 mm on  image 25 compared to 13 mm. Right middle lobe nodule measures 7 mm compared to 7 mm.  CT ABDOMEN AND PELVIS FINDINGS  Hepatobiliary: Unfortunately, there is progression of hepatic metastasis. Cluster of 4 new round low-density lesions in the dome of the right hepatic lobe are seen on image 46, series 2. These 4 lesions range in size from 10-16 mm. Additional new lesion in the lateral right hepatic lobe measures 19 mm on image 50.  The gallbladder is normal.  Common bile duct is normal caliber.  Pancreas: The pancreas appears normal. There is bulky periportal adenopathy adjacent to the pancreas.  Spleen: Interval increase in volume of  splenic metastasis. Largest lesion measures 7.6 cm compared to 3.5 cm. The spleen overall is increased in volume with multiple new lesions superiorly within the spleen.  Adrenals/urinary tract: Adrenal glands and kidneys are normal. The ureters and bladder normal.  Stomach/Bowel: Stomach, small bowel, appendix, and cecum are normal. The colon and rectosigmoid colon are normal.  Vascular/Lymphatic: Abdominal aorta demonstrates intimal calcification.  Bulky periportal metastatic adenopathy is mildly increased. For example 20 mm short axis lymph node on image 58 is increased 16 mm. There is left periaortic adenopathy similar to prior with a 14 mm short axis node on image 70 compared to 14 mm.  Reproductive: Uterus is normal.  No adnexal abnormality.  Musculoskeletal: Stable mixed lytic and sclerotic lesions in the spine and pelvis.  Other: No free fluid.  IMPRESSION: Chest Impression:  1. Stable pulmonary metastasis. 2. Stable left lower lobe mass within the atelectatic lung. 3. Stable left pleural effusion. 4. Decrease in size of left supraclavicular lymph node.  Abdomen / Pelvis Impression:  1. Dramatic increase in volume of splenic metastasis with enlargement of spleen and multiple new lesions. 2. Multiple new hepatic metastasis primarily in the right hepatic lobe. 3. Mild increase in  periportal adenopathy and stable retroperitoneal adenopathy. 4. Stable skeletal metastasis.  No evidence of fracture.   Electronically Signed   By: Suzy Bouchard M.D.   On: 11/09/2014 10:04    ASSESSMENT AND PLAN: This is a very pleasant 64 years old white female with metastatic non-small cell lung cancer, adenocarcinoma status post several treatment regimen including carboplatin, Alimta and Avastin followed by treatment with Nivolumab for 16 cycles and most recently treated with oral Gilotrif but unfortunately has evidence for disease progression especially in the spleen and liver. I had a lengthy discussion with the patient and her boyfriend today about her current disease status and treatment options. I discussed with the patient several options for treatment of her condition including palliative care versus palliative systemic chemotherapy with single agent gemcitabine or combination of docetaxel and Cyramza.  After discussions adverse effect of this treatment The patient would like to proceed with systemic chemotherapy with single agent gemcitabine. This will be given at a dose of 1000 MG/M2 on days 1 and 8 every 3 weeks. She is expected to start the first cycle of this treatment next week. I discussed with the patient adverse effect of the chemotherapy including but not limited to alopecia, myelosuppression, nausea and vomiting, peripheral neuropathy, liver or renal dysfunction. For pain management the patient will continue on fentanyl patch 50 g/hour every 3 days. She will come back for follow-up visit in 2 weeks for reevaluation and management of any adverse effect of her treatment. The patient was advised to call immediately if she has any concerning symptoms in the interval.  The patient voices understanding of current disease status and treatment options and is in agreement with the current care plan.  All questions were answered. The patient knows to call the clinic with any problems,  questions or concerns. We can certainly see the patient much sooner if necessary.  I spent 15 minutes counseling the patient face to face. The total time spent in the appointment was 30 minutes.  Disclaimer: This note was dictated with voice recognition software. Similar sounding words can inadvertently be transcribed and may not be corrected upon review.

## 2014-11-17 NOTE — Telephone Encounter (Signed)
Per staff message and POF I have scheduled appts. Advised scheduler of appts. JMW  

## 2014-11-18 ENCOUNTER — Ambulatory Visit (HOSPITAL_BASED_OUTPATIENT_CLINIC_OR_DEPARTMENT_OTHER): Payer: BC Managed Care – PPO

## 2014-11-18 VITALS — BP 105/64 | HR 89 | Temp 98.7°F | Resp 16

## 2014-11-18 DIAGNOSIS — R110 Nausea: Secondary | ICD-10-CM | POA: Diagnosis not present

## 2014-11-18 DIAGNOSIS — R197 Diarrhea, unspecified: Secondary | ICD-10-CM

## 2014-11-18 DIAGNOSIS — R5383 Other fatigue: Secondary | ICD-10-CM

## 2014-11-18 DIAGNOSIS — C7951 Secondary malignant neoplasm of bone: Secondary | ICD-10-CM

## 2014-11-18 MED ORDER — SODIUM CHLORIDE 0.9 % IJ SOLN
10.0000 mL | INTRAMUSCULAR | Status: DC | PRN
  Administered 2014-11-18: 10 mL via INTRAVENOUS
  Filled 2014-11-18: qty 10

## 2014-11-18 MED ORDER — SODIUM CHLORIDE 0.9 % IV SOLN
INTRAVENOUS | Status: DC
  Administered 2014-11-18: 11:00:00 via INTRAVENOUS

## 2014-11-18 MED ORDER — HEPARIN SOD (PORK) LOCK FLUSH 100 UNIT/ML IV SOLN
500.0000 [IU] | Freq: Once | INTRAVENOUS | Status: AC
  Administered 2014-11-18: 500 [IU] via INTRAVENOUS
  Filled 2014-11-18: qty 5

## 2014-11-18 NOTE — Progress Notes (Signed)
Pt states that decadron has helped her nausea and her appetite. Has been able to keep foods and drink down.

## 2014-11-18 NOTE — Patient Instructions (Signed)
Dehydration, Adult Dehydration is when you lose more fluids from the body than you take in. Vital organs like the kidneys, brain, and heart cannot function without a proper amount of fluids and salt. Any loss of fluids from the body can cause dehydration.  CAUSES   Vomiting.  Diarrhea.  Excessive sweating.  Excessive urine output.  Fever. SYMPTOMS  Mild dehydration  Thirst.  Dry lips.  Slightly dry mouth. Moderate dehydration  Very dry mouth.  Sunken eyes.  Skin does not bounce back quickly when lightly pinched and released.  Dark urine and decreased urine production.  Decreased tear production.  Headache. Severe dehydration  Very dry mouth.  Extreme thirst.  Rapid, weak pulse (more than 100 beats per minute at rest).  Cold hands and feet.  Not able to sweat in spite of heat and temperature.  Rapid breathing.  Blue lips.  Confusion and lethargy.  Difficulty being awakened.  Minimal urine production.  No tears. DIAGNOSIS  Your caregiver will diagnose dehydration based on your symptoms and your exam. Blood and urine tests will help confirm the diagnosis. The diagnostic evaluation should also identify the cause of dehydration. TREATMENT  Treatment of mild or moderate dehydration can often be done at home by increasing the amount of fluids that you drink. It is best to drink small amounts of fluid more often. Drinking too much at one time can make vomiting worse. Refer to the home care instructions below. Severe dehydration needs to be treated at the hospital where you will probably be given intravenous (IV) fluids that contain water and electrolytes. HOME CARE INSTRUCTIONS   Ask your caregiver about specific rehydration instructions.  Drink enough fluids to keep your urine clear or pale yellow.  Drink small amounts frequently if you have nausea and vomiting.  Eat as you normally do.  Avoid:  Foods or drinks high in sugar.  Carbonated  drinks.  Juice.  Extremely hot or cold fluids.  Drinks with caffeine.  Fatty, greasy foods.  Alcohol.  Tobacco.  Overeating.  Gelatin desserts.  Wash your hands well to avoid spreading bacteria and viruses.  Only take over-the-counter or prescription medicines for pain, discomfort, or fever as directed by your caregiver.  Ask your caregiver if you should continue all prescribed and over-the-counter medicines.  Keep all follow-up appointments with your caregiver. SEEK MEDICAL CARE IF:  You have abdominal pain and it increases or stays in one area (localizes).  You have a rash, stiff neck, or severe headache.  You are irritable, sleepy, or difficult to awaken.  You are weak, dizzy, or extremely thirsty. SEEK IMMEDIATE MEDICAL CARE IF:   You are unable to keep fluids down or you get worse despite treatment.  You have frequent episodes of vomiting or diarrhea.  You have blood or green matter (bile) in your vomit.  You have blood in your stool or your stool looks black and tarry.  You have not urinated in 6 to 8 hours, or you have only urinated a small amount of very dark urine.  You have a fever.  You faint. MAKE SURE YOU:   Understand these instructions.  Will watch your condition.  Will get help right away if you are not doing well or get worse. Document Released: 04/01/2005 Document Revised: 06/24/2011 Document Reviewed: 11/19/2010 ExitCare Patient Information 2015 [employer]. This information is not intended to replace advice given to you by your health care provider. Make sure you discuss any questions you have with your health care   provider.  

## 2014-11-23 ENCOUNTER — Encounter: Payer: Self-pay | Admitting: Skilled Nursing Facility1

## 2014-11-23 NOTE — Progress Notes (Signed)
Subjective:     Patient ID: Sherri Stafford, female   DOB: May 16, 1950, 64 y.o.   MRN: [id number]  HPI   Review of Systems     Objective:   Physical Exam  To assist the pt in identifying some dietary strategies to gain lost wt back.    Assessment:     Pt identified as being malnourished due to losing some wt. Pt contacted via the telephone at [phone]. Pt was unavailable.    Plan:     Dietitian left a voicemail for the pt prompting her to contact the dietitian at [phone].

## 2014-11-24 ENCOUNTER — Encounter: Payer: Self-pay | Admitting: Radiation Oncology

## 2014-11-24 ENCOUNTER — Ambulatory Visit: Payer: BC Managed Care – PPO

## 2014-11-24 ENCOUNTER — Other Ambulatory Visit (HOSPITAL_BASED_OUTPATIENT_CLINIC_OR_DEPARTMENT_OTHER): Payer: BC Managed Care – PPO

## 2014-11-24 ENCOUNTER — Other Ambulatory Visit: Payer: Self-pay | Admitting: *Deleted

## 2014-11-24 ENCOUNTER — Ambulatory Visit
Admission: RE | Admit: 2014-11-24 | Discharge: 2014-11-24 | Disposition: A | Discharge status: Home / Self Care | Payer: BC Managed Care – PPO | Source: Ambulatory Visit | Attending: Radiation Oncology | Admitting: Radiation Oncology

## 2014-11-24 ENCOUNTER — Telehealth: Payer: Self-pay | Admitting: Physician Assistant

## 2014-11-24 ENCOUNTER — Ambulatory Visit (HOSPITAL_BASED_OUTPATIENT_CLINIC_OR_DEPARTMENT_OTHER): Payer: BC Managed Care – PPO

## 2014-11-24 VITALS — Ht 64.0 in | Wt 107.7 lb

## 2014-11-24 VITALS — BP 99/60 | HR 96 | Temp 97.9°F | Resp 20

## 2014-11-24 DIAGNOSIS — C3490 Malignant neoplasm of unspecified part of unspecified bronchus or lung: Secondary | ICD-10-CM

## 2014-11-24 DIAGNOSIS — C7889 Secondary malignant neoplasm of other digestive organs: Secondary | ICD-10-CM

## 2014-11-24 DIAGNOSIS — C7931 Secondary malignant neoplasm of brain: Secondary | ICD-10-CM

## 2014-11-24 DIAGNOSIS — C787 Secondary malignant neoplasm of liver and intrahepatic bile duct: Secondary | ICD-10-CM

## 2014-11-24 DIAGNOSIS — Z5111 Encounter for antineoplastic chemotherapy: Secondary | ICD-10-CM

## 2014-11-24 DIAGNOSIS — C3432 Malignant neoplasm of lower lobe, left bronchus or lung: Secondary | ICD-10-CM

## 2014-11-24 DIAGNOSIS — Z95828 Presence of other vascular implants and grafts: Secondary | ICD-10-CM

## 2014-11-24 DIAGNOSIS — C7951 Secondary malignant neoplasm of bone: Secondary | ICD-10-CM

## 2014-11-24 LAB — COMPREHENSIVE METABOLIC PANEL (CC13)
ALBUMIN: 3 g/dL — AB (ref 3.5–5.0)
ALK PHOS: 101 U/L (ref 40–150)
ALT: 12 U/L (ref 0–55)
AST: 11 U/L (ref 5–34)
Anion Gap: 6 mEq/L (ref 3–11)
BUN: 11.4 mg/dL (ref 7.0–26.0)
CO2: 26 mEq/L (ref 22–29)
CREATININE: 0.6 mg/dL (ref 0.6–1.1)
Calcium: 8.5 mg/dL (ref 8.4–10.4)
Chloride: 101 mEq/L (ref 98–109)
GLUCOSE: 105 mg/dL (ref 70–140)
POTASSIUM: 4.2 meq/L (ref 3.5–5.1)
Sodium: 133 mEq/L — ABNORMAL LOW (ref 136–145)
Total Bilirubin: 1.1 mg/dL (ref 0.20–1.20)
Total Protein: 6.2 g/dL — ABNORMAL LOW (ref 6.4–8.3)

## 2014-11-24 LAB — CBC WITH DIFFERENTIAL/PLATELET
BASO%: 0.3 % (ref 0.0–2.0)
BASOS ABS: 0 10*3/uL (ref 0.0–0.1)
EOS ABS: 0.3 10*3/uL (ref 0.0–0.5)
EOS%: 4.1 % (ref 0.0–7.0)
HCT: 32.3 % — ABNORMAL LOW (ref 34.8–46.6)
HGB: 10.4 g/dL — ABNORMAL LOW (ref 11.6–15.9)
LYMPH%: 7.4 % — ABNORMAL LOW (ref 14.0–49.7)
MCH: 26.5 pg (ref 25.1–34.0)
MCHC: 32.2 g/dL (ref 31.5–36.0)
MCV: 82.4 fL (ref 79.5–101.0)
MONO#: 0.5 10*3/uL (ref 0.1–0.9)
MONO%: 8 % (ref 0.0–14.0)
NEUT%: 80.2 % — AB (ref 38.4–76.8)
NEUTROS ABS: 5.3 10*3/uL (ref 1.5–6.5)
PLATELETS: 164 10*3/uL (ref 145–400)
RBC: 3.92 10*6/uL (ref 3.70–5.45)
RDW: 16.9 % — ABNORMAL HIGH (ref 11.2–14.5)
WBC: 6.6 10*3/uL (ref 3.9–10.3)
lymph#: 0.5 10*3/uL — ABNORMAL LOW (ref 0.9–3.3)

## 2014-11-24 MED ORDER — PROCHLORPERAZINE MALEATE 10 MG PO TABS
10.0000 mg | ORAL_TABLET | Freq: Once | ORAL | Status: AC
  Administered 2014-11-24: 10 mg via ORAL

## 2014-11-24 MED ORDER — SODIUM CHLORIDE 0.9 % IV SOLN
Freq: Once | INTRAVENOUS | Status: AC
  Administered 2014-11-24: 11:00:00 via INTRAVENOUS

## 2014-11-24 MED ORDER — SODIUM CHLORIDE 0.9 % IJ SOLN
10.0000 mL | INTRAMUSCULAR | Status: DC | PRN
  Administered 2014-11-24: 10 mL via INTRAVENOUS
  Filled 2014-11-24: qty 10

## 2014-11-24 MED ORDER — PROCHLORPERAZINE MALEATE 10 MG PO TABS
ORAL_TABLET | ORAL | Status: AC
  Filled 2014-11-24: qty 1

## 2014-11-24 MED ORDER — HEPARIN SOD (PORK) LOCK FLUSH 100 UNIT/ML IV SOLN
500.0000 [IU] | Freq: Once | INTRAVENOUS | Status: AC | PRN
  Administered 2014-11-24: 500 [IU]
  Filled 2014-11-24: qty 5

## 2014-11-24 MED ORDER — SODIUM CHLORIDE 0.9 % IJ SOLN
10.0000 mL | INTRAMUSCULAR | Status: DC | PRN
Start: 2014-11-24 — End: 2014-11-24
  Filled 2014-11-24: qty 10

## 2014-11-24 MED ORDER — SODIUM CHLORIDE 0.9 % IV SOLN
1000.0000 mg/m2 | Freq: Once | INTRAVENOUS | Status: AC
  Administered 2014-11-24: 1520 mg via INTRAVENOUS
  Filled 2014-11-24: qty 39.98

## 2014-11-24 MED ORDER — SODIUM CHLORIDE 0.9 % IJ SOLN
10.0000 mL | INTRAMUSCULAR | Status: DC | PRN
  Administered 2014-11-24: 10 mL
  Filled 2014-11-24: qty 10

## 2014-11-24 NOTE — Telephone Encounter (Signed)
Received a request for refill fax from [facility]  for Sherri Stafford for xarelto 20 mg tab 30 qty. 5 refills to be taken 1 tablet by mouth daily. fax given to Adrena to fill out. Refill  request filled out and faxed today. Confirmation sheet confirmed fax received at 4:18

## 2014-11-24 NOTE — Patient Instructions (Signed)

## 2014-11-24 NOTE — Progress Notes (Signed)
Radiation Oncology         [phone] ________________________________  Name: Sherri Stafford MRN: [id number]  Date: 11/24/2014  DOB: 05-20-1950    Follow-Up Visit Note  CC: [name], MD  Curt Bears, MD   Diagnosis:   Stage IV (T2a, N3, M1b) non-small cell lung cancer  Interval Since Last Radiation:  1  months  Narrative:  The patient returns today for routine follow-up.  She denies pain today. She is using a fentynel 50 mcg patch. She reports she is able to walk with out any issues. She reports having a poor appetite. She has lost 4 lbs since 11/17/14. Her husband said she is not eating very much at all. She denies having any trouble swallowing. She denies having any bowel issues except for occasional constipation. She denies having any bladder issues. She continues to take Gilotrif and had chemotherapy today. Vitals were taken in the infusion area. She reports having a low energy level.                               ALLERGIES:  is allergic to other; ketoconazole; lorazepam; morphine and related; nystatin; sporanox; tylenol; vitamin d; and zofran.  Meds: Current Outpatient Prescriptions  Medication Sig Dispense Refill  . dexamethasone (DECADRON) 4 MG tablet May take 1/2 to 1 tab PO BID PRN nausea. 30 tablet 1  . docusate sodium (COLACE) 100 MG capsule Take 100 mg by mouth 2 (two) times daily.    . fentaNYL (DURAGESIC - DOSED MCG/HR) 50 MCG/HR Place 1 patch (50 mcg total) onto the skin every 3 (three) days. 10 patch 0  . OLANZapine zydis (ZYPREXA ZYDIS) 10 MG disintegrating tablet For nausea 30 tablet 0  . prochlorperazine (COMPAZINE) 10 MG tablet Take 1 tablet (10 mg total) by mouth every 6 (six) hours as needed for nausea or vomiting. 45 tablet 2  . rivaroxaban (XARELTO) 20 MG TABS tablet Take 1 tablet (20 mg total) by mouth daily with supper. 30 tablet 5  . afatinib dimaleate (GILOTRIF) 30 MG tablet Take 1 tablet (30 mg total) by mouth daily. Take on an empty stomach  1hr before or 2 hrs after meals. (Patient not taking: Reported on 11/24/2014) 30 tablet 1  . albuterol (PROVENTIL HFA;VENTOLIN HFA) 108 (90 BASE) MCG/ACT inhaler Inhale 1-2 puffs into the lungs every 6 (six) hours as needed for wheezing or shortness of breath. (Patient not taking: Reported on 10/31/2014) 1 Inhaler 2  . Alum & Mag Hydroxide-Simeth (MAGIC MOUTHWASH W/LIDOCAINE) SOLN Take 5 mLs by mouth 4 (four) times daily as needed for mouth pain. (Patient not taking: Reported on 10/31/2014) 240 mL 1  . bisacodyl (DULCOLAX) 10 MG suppository Place 1 suppository (10 mg total) rectally daily as needed for moderate constipation. (Patient not taking: Reported on 10/31/2014) 10 suppository 0  . calcium carbonate (OS-CAL) 1250 MG chewable tablet Chew 4 tablets by mouth daily.    . clindamycin (CLEOCIN-T) 1 % external solution Apply topically 2 (two) times daily. (Patient not taking: Reported on 10/31/2014) 30 mL 0  . [employer] SENNA PLUS 8.6-50 MG per tablet   11  . diphenhydrAMINE (SOMINEX) 25 MG tablet Take 25 mg by mouth at bedtime as needed for sleep. Takes 2 tablets every 3-4 hours during the night.    . diphenoxylate-atropine (LOMOTIL) 2.5-0.025 MG per tablet Take 2 tablets by mouth 4 (four) times daily as needed for diarrhea or loose stools. (Patient not  taking: Reported on 10/31/2014) 30 tablet 0  . glycerin adult 2 G SUPP Place 1 suppository rectally once as needed for moderate constipation.    . naproxen sodium (ANAPROX) 220 MG tablet Take 220 mg by mouth 2 (two) times daily with a meal.    . polyethylene glycol (MIRALAX / GLYCOLAX) packet Take 17 g by mouth daily as needed for moderate constipation.    . sucralfate (CARAFATE) 1 GM/10ML suspension Take 10 mLs (1 g total) by mouth 4 (four) times daily -  with meals and at bedtime. (Patient not taking: Reported on 10/31/2014) 420 mL 0  . traMADol (ULTRAM) 50 MG tablet TAKE 1 TABLET BY MOUTH EVERY 6 HOURS AS NEEDED (Patient not taking: Reported on 10/31/2014) 60  tablet 0   No current facility-administered medications for this encounter.   Facility-Administered Medications Ordered in Other Encounters  Medication Dose Route Frequency Provider Last Rate Last Dose  . sodium chloride 0.9 % injection 10 mL  10 mL Intracatheter PRN Curt Bears, MD   10 mL at 04/07/14 1200  . sodium chloride 0.9 % injection 10 mL  10 mL Intravenous PRN Curt Bears, MD   10 mL at 11/16/14 3154    Physical Findings: The patient is in no acute distress. Patient is alert and oriented.  height is '5\' 4"'$  (1.626 m) and weight is 107 lb 11.2 oz (48.852 kg). .   Lungs are clear. Heart has regular rhythm and rate. No cervical or subclavicular adenopathy present.  Lab Findings: Lab Results  Component Value Date   WBC 6.6 11/24/2014   HGB 10.4* 11/24/2014   HCT 32.3* 11/24/2014   MCV 82.4 11/24/2014   PLT 164 11/24/2014    Radiographic Findings: Ct Chest W Contrast  11/09/2014   CLINICAL DATA:  Lung cancer diagnosed 08/25/2013. Metastatic disease to bone. Chemotherapy in progress. Radiation therapy complete 2015. Subsequent treatment strategy.  EXAM: CT CHEST, ABDOMEN, AND PELVIS WITH CONTRAST  TECHNIQUE: Multidetector CT imaging of the chest, abdomen and pelvis was performed following the standard protocol during bolus administration of intravenous contrast.  CONTRAST:  51m OMNIPAQUE IOHEXOL 300 MG/ML  SOLN  COMPARISON:  CT 08/22/2014  FINDINGS: CT CHEST FINDINGS  Mediastinum/Nodes: No axillary lymphadenopathy. Left supraclavicular lymph node is decreased in volume measuring 16 mm on image 8 series 2 compared 35 mm. No mediastinal adenopathy. No pericardial fluid.  Lungs/Pleura: There is a moderate left pleural effusion. Within the left lower lobe there is a focus of rounded atelectasis with low-density lesions within the atelectatic lung. This rounded lung measures 3.9 cm compared to 3.9 cm on prior.  There are bilateral pulmonary metastasis again demonstrated. Example  nodule in the left upper lobe measures 7 mm on image 15, series 4 compared to 9 mm on prior. Left upper lobe nodule measures 15 mm on image 25 compared to 13 mm. Right middle lobe nodule measures 7 mm compared to 7 mm.  CT ABDOMEN AND PELVIS FINDINGS  Hepatobiliary: Unfortunately, there is progression of hepatic metastasis. Cluster of 4 new round low-density lesions in the dome of the right hepatic lobe are seen on image 46, series 2. These 4 lesions range in size from 10-16 mm. Additional new lesion in the lateral right hepatic lobe measures 19 mm on image 50.  The gallbladder is normal.  Common bile duct is normal caliber.  Pancreas: The pancreas appears normal. There is bulky periportal adenopathy adjacent to the pancreas.  Spleen: Interval increase in volume of splenic metastasis. Largest  lesion measures 7.6 cm compared to 3.5 cm. The spleen overall is increased in volume with multiple new lesions superiorly within the spleen.  Adrenals/urinary tract: Adrenal glands and kidneys are normal. The ureters and bladder normal.  Stomach/Bowel: Stomach, small bowel, appendix, and cecum are normal. The colon and rectosigmoid colon are normal.  Vascular/Lymphatic: Abdominal aorta demonstrates intimal calcification.  Bulky periportal metastatic adenopathy is mildly increased. For example 20 mm short axis lymph node on image 58 is increased 16 mm. There is left periaortic adenopathy similar to prior with a 14 mm short axis node on image 70 compared to 14 mm.  Reproductive: Uterus is normal.  No adnexal abnormality.  Musculoskeletal: Stable mixed lytic and sclerotic lesions in the spine and pelvis.  Other: No free fluid.  IMPRESSION: Chest Impression:  1. Stable pulmonary metastasis. 2. Stable left lower lobe mass within the atelectatic lung. 3. Stable left pleural effusion. 4. Decrease in size of left supraclavicular lymph node.  Abdomen / Pelvis Impression:  1. Dramatic increase in volume of splenic metastasis with  enlargement of spleen and multiple new lesions. 2. Multiple new hepatic metastasis primarily in the right hepatic lobe. 3. Mild increase in periportal adenopathy and stable retroperitoneal adenopathy. 4. Stable skeletal metastasis.  No evidence of fracture.   Electronically Signed   By: Suzy Bouchard M.D.   On: 11/09/2014 10:04   Ct Abdomen Pelvis W Contrast  11/09/2014   CLINICAL DATA:  Lung cancer diagnosed 08/25/2013. Metastatic disease to bone. Chemotherapy in progress. Radiation therapy complete 2015. Subsequent treatment strategy.  EXAM: CT CHEST, ABDOMEN, AND PELVIS WITH CONTRAST  TECHNIQUE: Multidetector CT imaging of the chest, abdomen and pelvis was performed following the standard protocol during bolus administration of intravenous contrast.  CONTRAST:  60m OMNIPAQUE IOHEXOL 300 MG/ML  SOLN  COMPARISON:  CT 08/22/2014  FINDINGS: CT CHEST FINDINGS  Mediastinum/Nodes: No axillary lymphadenopathy. Left supraclavicular lymph node is decreased in volume measuring 16 mm on image 8 series 2 compared 35 mm. No mediastinal adenopathy. No pericardial fluid.  Lungs/Pleura: There is a moderate left pleural effusion. Within the left lower lobe there is a focus of rounded atelectasis with low-density lesions within the atelectatic lung. This rounded lung measures 3.9 cm compared to 3.9 cm on prior.  There are bilateral pulmonary metastasis again demonstrated. Example nodule in the left upper lobe measures 7 mm on image 15, series 4 compared to 9 mm on prior. Left upper lobe nodule measures 15 mm on image 25 compared to 13 mm. Right middle lobe nodule measures 7 mm compared to 7 mm.  CT ABDOMEN AND PELVIS FINDINGS  Hepatobiliary: Unfortunately, there is progression of hepatic metastasis. Cluster of 4 new round low-density lesions in the dome of the right hepatic lobe are seen on image 46, series 2. These 4 lesions range in size from 10-16 mm. Additional new lesion in the lateral right hepatic lobe measures 19 mm  on image 50.  The gallbladder is normal.  Common bile duct is normal caliber.  Pancreas: The pancreas appears normal. There is bulky periportal adenopathy adjacent to the pancreas.  Spleen: Interval increase in volume of splenic metastasis. Largest lesion measures 7.6 cm compared to 3.5 cm. The spleen overall is increased in volume with multiple new lesions superiorly within the spleen.  Adrenals/urinary tract: Adrenal glands and kidneys are normal. The ureters and bladder normal.  Stomach/Bowel: Stomach, small bowel, appendix, and cecum are normal. The colon and rectosigmoid colon are normal.  Vascular/Lymphatic: Abdominal aorta  demonstrates intimal calcification.  Bulky periportal metastatic adenopathy is mildly increased. For example 20 mm short axis lymph node on image 58 is increased 16 mm. There is left periaortic adenopathy similar to prior with a 14 mm short axis node on image 70 compared to 14 mm.  Reproductive: Uterus is normal.  No adnexal abnormality.  Musculoskeletal: Stable mixed lytic and sclerotic lesions in the spine and pelvis.  Other: No free fluid.  IMPRESSION: Chest Impression:  1. Stable pulmonary metastasis. 2. Stable left lower lobe mass within the atelectatic lung. 3. Stable left pleural effusion. 4. Decrease in size of left supraclavicular lymph node.  Abdomen / Pelvis Impression:  1. Dramatic increase in volume of splenic metastasis with enlargement of spleen and multiple new lesions. 2. Multiple new hepatic metastasis primarily in the right hepatic lobe. 3. Mild increase in periportal adenopathy and stable retroperitoneal adenopathy. 4. Stable skeletal metastasis.  No evidence of fracture.   Electronically Signed   By: Suzy Bouchard M.D.   On: 11/09/2014 10:04    Impression:  The patient is recovering from the effects of radiation. Patient has received benefit from her palliative radiation in terms of pain control directed at the right sacroiliac joint area and left acetabular area.  She will continue on systemic therapy with medical oncology.  Plan:  Follow up with radiation oncology as needed.  This document serves as a record of services personally performed by Gery Pray, MD. It was created on his behalf by Arlyce Harman, a trained medical scribe. The creation of this record is based on the scribe's personal observations and the provider's statements to them. This document has been checked and approved by the attending provider. -----------------------------------  Blair Promise, PhD, MD

## 2014-11-24 NOTE — Patient Instructions (Signed)
[facility] Discharge Instructions for Patients Receiving Chemotherapy  Today you received the following chemotherapy agents Gemzar  To help prevent nausea and vomiting after your treatment, we encourage you to take your nausea medication as needed   If you develop nausea and vomiting that is not controlled by your nausea medication, call the clinic.   BELOW ARE SYMPTOMS THAT SHOULD BE REPORTED IMMEDIATELY:  *FEVER GREATER THAN 100.5 F  *CHILLS WITH OR WITHOUT FEVER  NAUSEA AND VOMITING THAT IS NOT CONTROLLED WITH YOUR NAUSEA MEDICATION  *UNUSUAL SHORTNESS OF BREATH  *UNUSUAL BRUISING OR BLEEDING  TENDERNESS IN MOUTH AND THROAT WITH OR WITHOUT PRESENCE OF ULCERS  *URINARY PROBLEMS  *BOWEL PROBLEMS  UNUSUAL RASH Items with * indicate a potential emergency and should be followed up as soon as possible.  Feel free to call the clinic you have any questions or concerns. The clinic phone number is [phone].  Please show the [facility] at check-in to the Emergency Department and triage nurse.  Gemcitabine injection What is this medicine? GEMCITABINE (jem SIT a been) is a chemotherapy drug. This medicine is used to treat many types of cancer like breast cancer, lung cancer, pancreatic cancer, and ovarian cancer. This medicine may be used for other purposes; ask your health care provider or pharmacist if you have questions. COMMON BRAND NAME(S): Gemzar What should I tell my health care provider before I take this medicine? They need to know if you have any of these conditions: -blood disorders -infection -kidney disease -liver disease -recent or ongoing radiation therapy -an unusual or allergic reaction to gemcitabine, other chemotherapy, other medicines, foods, dyes, or preservatives -pregnant or trying to get pregnant -breast-feeding How should I use this medicine? This drug is given as an infusion into a vein. It is administered in a hospital or  clinic by a specially trained health care professional. Talk to your pediatrician regarding the use of this medicine in children. Special care may be needed. Overdosage: If you think you have taken too much of this medicine contact a poison control center or emergency room at once. NOTE: This medicine is only for you. Do not share this medicine with others. What if I miss a dose? It is important not to miss your dose. Call your doctor or health care professional if you are unable to keep an appointment. What may interact with this medicine? -medicines to increase blood counts like filgrastim, pegfilgrastim, sargramostim -some other chemotherapy drugs like cisplatin -vaccines Talk to your doctor or health care professional before taking any of these medicines: -acetaminophen -aspirin -ibuprofen -ketoprofen -naproxen This list may not describe all possible interactions. Give your health care provider a list of all the medicines, herbs, non-prescription drugs, or dietary supplements you use. Also tell them if you smoke, drink alcohol, or use illegal drugs. Some items may interact with your medicine. What should I watch for while using this medicine? Visit your doctor for checks on your progress. This drug may make you feel generally unwell. This is not uncommon, as chemotherapy can affect healthy cells as well as cancer cells. Report any side effects. Continue your course of treatment even though you feel ill unless your doctor tells you to stop. In some cases, you may be given additional medicines to help with side effects. Follow all directions for their use. Call your doctor or health care professional for advice if you get a fever, chills or sore throat, or other symptoms of a cold or flu.  Do not treat yourself. This drug decreases your body's ability to fight infections. Try to avoid being around people who are sick. This medicine may increase your risk to bruise or bleed. Call your doctor or  health care professional if you notice any unusual bleeding. Be careful brushing and flossing your teeth or using a toothpick because you may get an infection or bleed more easily. If you have any dental work done, tell your dentist you are receiving this medicine. Avoid taking products that contain aspirin, acetaminophen, ibuprofen, naproxen, or ketoprofen unless instructed by your doctor. These medicines may hide a fever. Women should inform their doctor if they wish to become pregnant or think they might be pregnant. There is a potential for serious side effects to an unborn child. Talk to your health care professional or pharmacist for more information. Do not breast-feed an infant while taking this medicine. What side effects may I notice from receiving this medicine? Side effects that you should report to your doctor or health care professional as soon as possible: -allergic reactions like skin rash, itching or hives, swelling of the face, lips, or tongue -low blood counts - this medicine may decrease the number of white blood cells, red blood cells and platelets. You may be at increased risk for infections and bleeding. -signs of infection - fever or chills, cough, sore throat, pain or difficulty passing urine -signs of decreased platelets or bleeding - bruising, pinpoint red spots on the skin, black, tarry stools, blood in the urine -signs of decreased red blood cells - unusually weak or tired, fainting spells, lightheadedness -breathing problems -chest pain -mouth sores -nausea and vomiting -pain, swelling, redness at site where injected -pain, tingling, numbness in the hands or feet -stomach pain -swelling of ankles, feet, hands -unusual bleeding Side effects that usually do not require medical attention (report to your doctor or health care professional if they continue or are bothersome): -constipation -diarrhea -hair loss -loss of appetite -stomach upset This list may not  describe all possible side effects. Call your doctor for medical advice about side effects. You may report side effects to FDA at 1-800-FDA-1088. Where should I keep my medicine? This drug is given in a hospital or clinic and will not be stored at home. NOTE: This sheet is a summary. It may not cover all possible information. If you have questions about this medicine, talk to your doctor, pharmacist, or health care provider.  2015, [employer]vier/Gold Standard. (2007-08-11 18:45:54)

## 2014-11-24 NOTE — Progress Notes (Addendum)
Sherri Stafford is here for follow up.  She denies pain today.  She is using a fentynel 50 mcg patch.  She reports she is able to walk with out any issues.  She reports having a poor appetite.  She has lost 4 lbs since 11/17/14.  Her husband said she is not eating very much at all.  She denies having any trouble swallowing. She denies having any bowel issues except for occasional constipation.  She denies having any bladder issues.  She continues to take Gilotrif and had chemotherapy today.  Vitals were taken in the infusion area.  She reports having a low energy level.  Wt Readings from Last 3 Encounters:  11/24/14 107 lb 11.2 oz (48.852 kg)  11/17/14 111 lb 14.4 oz (50.758 kg)  10/31/14 112 lb 8 oz (51.03 kg)

## 2014-11-25 ENCOUNTER — Ambulatory Visit: Payer: BC Managed Care – PPO

## 2014-12-01 ENCOUNTER — Ambulatory Visit (HOSPITAL_BASED_OUTPATIENT_CLINIC_OR_DEPARTMENT_OTHER): Payer: BC Managed Care – PPO

## 2014-12-01 ENCOUNTER — Other Ambulatory Visit (HOSPITAL_BASED_OUTPATIENT_CLINIC_OR_DEPARTMENT_OTHER): Payer: BC Managed Care – PPO

## 2014-12-01 ENCOUNTER — Ambulatory Visit: Payer: BC Managed Care – PPO

## 2014-12-01 ENCOUNTER — Telehealth: Payer: Self-pay | Admitting: Nurse Practitioner

## 2014-12-01 ENCOUNTER — Ambulatory Visit (HOSPITAL_BASED_OUTPATIENT_CLINIC_OR_DEPARTMENT_OTHER): Payer: BC Managed Care – PPO | Admitting: Nurse Practitioner

## 2014-12-01 ENCOUNTER — Encounter: Payer: Self-pay | Admitting: Nurse Practitioner

## 2014-12-01 ENCOUNTER — Other Ambulatory Visit: Payer: Self-pay | Admitting: Medical Oncology

## 2014-12-01 VITALS — BP 101/76 | HR 104 | Temp 98.0°F | Resp 16 | Ht 64.0 in | Wt 103.7 lb

## 2014-12-01 DIAGNOSIS — C3490 Malignant neoplasm of unspecified part of unspecified bronchus or lung: Secondary | ICD-10-CM

## 2014-12-01 DIAGNOSIS — Z95828 Presence of other vascular implants and grafts: Secondary | ICD-10-CM

## 2014-12-01 DIAGNOSIS — C7889 Secondary malignant neoplasm of other digestive organs: Secondary | ICD-10-CM

## 2014-12-01 DIAGNOSIS — C3432 Malignant neoplasm of lower lobe, left bronchus or lung: Secondary | ICD-10-CM

## 2014-12-01 DIAGNOSIS — Z5111 Encounter for antineoplastic chemotherapy: Secondary | ICD-10-CM | POA: Diagnosis not present

## 2014-12-01 DIAGNOSIS — R630 Anorexia: Secondary | ICD-10-CM

## 2014-12-01 DIAGNOSIS — C7931 Secondary malignant neoplasm of brain: Secondary | ICD-10-CM | POA: Diagnosis not present

## 2014-12-01 DIAGNOSIS — C7951 Secondary malignant neoplasm of bone: Secondary | ICD-10-CM

## 2014-12-01 DIAGNOSIS — C787 Secondary malignant neoplasm of liver and intrahepatic bile duct: Secondary | ICD-10-CM

## 2014-12-01 DIAGNOSIS — C770 Secondary and unspecified malignant neoplasm of lymph nodes of head, face and neck: Secondary | ICD-10-CM

## 2014-12-01 DIAGNOSIS — R634 Abnormal weight loss: Secondary | ICD-10-CM

## 2014-12-01 LAB — COMPREHENSIVE METABOLIC PANEL (CC13)
ALT: 8 U/L (ref 0–55)
AST: 11 U/L (ref 5–34)
Albumin: 3 g/dL — ABNORMAL LOW (ref 3.5–5.0)
Alkaline Phosphatase: 113 U/L (ref 40–150)
Anion Gap: 12 mEq/L — ABNORMAL HIGH (ref 3–11)
BILIRUBIN TOTAL: 1.05 mg/dL (ref 0.20–1.20)
BUN: 11.3 mg/dL (ref 7.0–26.0)
CHLORIDE: 99 meq/L (ref 98–109)
CO2: 22 meq/L (ref 22–29)
Calcium: 8.3 mg/dL — ABNORMAL LOW (ref 8.4–10.4)
Creatinine: 0.7 mg/dL (ref 0.6–1.1)
EGFR: >90 mL/min/{1.73_m2} (ref >90)
GLUCOSE: 116 mg/dL (ref 70–140)
Potassium: 4.1 mEq/L (ref 3.5–5.1)
SODIUM: 133 meq/L — AB (ref 136–145)
TOTAL PROTEIN: 6.3 g/dL — AB (ref 6.4–8.3)

## 2014-12-01 LAB — CBC WITH DIFFERENTIAL/PLATELET
BASO%: 0.7 % (ref 0.0–2.0)
Basophils Absolute: 0 10*3/uL (ref 0.0–0.1)
EOS%: 3.9 % (ref 0.0–7.0)
Eosinophils Absolute: 0.2 10*3/uL (ref 0.0–0.5)
HCT: 30.5 % — ABNORMAL LOW (ref 34.8–46.6)
HGB: 9.9 g/dL — ABNORMAL LOW (ref 11.6–15.9)
LYMPH%: 8 % — AB (ref 14.0–49.7)
MCH: 26.1 pg (ref 25.1–34.0)
MCHC: 32.5 g/dL (ref 31.5–36.0)
MCV: 80.3 fL (ref 79.5–101.0)
MONO#: 0.4 10*3/uL (ref 0.1–0.9)
MONO%: 9.3 % (ref 0.0–14.0)
NEUT%: 78.1 % — ABNORMAL HIGH (ref 38.4–76.8)
NEUTROS ABS: 3.4 10*3/uL (ref 1.5–6.5)
Platelets: 138 10*3/uL — ABNORMAL LOW (ref 145–400)
RBC: 3.79 10*6/uL (ref 3.70–5.45)
RDW: 17.7 % — ABNORMAL HIGH (ref 11.2–14.5)
WBC: 4.4 10*3/uL (ref 3.9–10.3)
lymph#: 0.3 10*3/uL — ABNORMAL LOW (ref 0.9–3.3)

## 2014-12-01 MED ORDER — DRONABINOL 2.5 MG PO CAPS: 2.5000 mg | ORAL_CAPSULE | Freq: Two times a day (BID) | ORAL | Status: DC

## 2014-12-01 MED ORDER — HEPARIN SOD (PORK) LOCK FLUSH 100 UNIT/ML IV SOLN
500.0000 [IU] | Freq: Once | INTRAVENOUS | Status: AC | PRN
  Administered 2014-12-01: 500 [IU]
  Filled 2014-12-01: qty 5

## 2014-12-01 MED ORDER — PROCHLORPERAZINE MALEATE 10 MG PO TABS
ORAL_TABLET | ORAL | Status: AC
  Filled 2014-12-01: qty 1

## 2014-12-01 MED ORDER — SODIUM CHLORIDE 0.9 % IJ SOLN
10.0000 mL | INTRAMUSCULAR | Status: DC | PRN
Start: 2014-12-01 — End: 2014-12-01
  Administered 2014-12-01: 10 mL via INTRAVENOUS
  Filled 2014-12-01: qty 10

## 2014-12-01 MED ORDER — SODIUM CHLORIDE 0.9 % IV SOLN
Freq: Once | INTRAVENOUS | Status: AC
  Administered 2014-12-01: 13:00:00 via INTRAVENOUS

## 2014-12-01 MED ORDER — SODIUM CHLORIDE 0.9 % IJ SOLN
10.0000 mL | INTRAMUSCULAR | Status: DC | PRN
  Administered 2014-12-01: 10 mL
  Filled 2014-12-01: qty 10

## 2014-12-01 MED ORDER — SODIUM CHLORIDE 0.9 % IV SOLN
1000.0000 mg/m2 | Freq: Once | INTRAVENOUS | Status: AC
  Administered 2014-12-01: 1520 mg via INTRAVENOUS
  Filled 2014-12-01: qty 40

## 2014-12-01 MED ORDER — PROCHLORPERAZINE MALEATE 10 MG PO TABS
10.0000 mg | ORAL_TABLET | Freq: Once | ORAL | Status: AC
  Administered 2014-12-01: 10 mg via ORAL

## 2014-12-01 NOTE — Telephone Encounter (Signed)
Appointments made and avs printed for patient °

## 2014-12-01 NOTE — Progress Notes (Signed)
  [facility] OFFICE PROGRESS NOTE   DIAGNOSIS: stage IV (T2a, N3, M1b) non-small cell lung cancer, adenocarcinoma presented with large left lower lobe lung mass in addition to bilateral pulmonary nodules and mediastinal and supraclavicular lymphadenopathy diagnosed in May of 2015.  Genomic Alterations Identified? ERBB3 amplification CDK4 amplification IDH2 R172S KRAS G12D MDM2 amplification RBM10 [id number]*36 TERC amplification - equivocal? Additional Disease-relevant Genes with No Reportable Alterations Identified? RET ALK BRAF ERBB2 MET EGFR  PRIOR THERAPY:  1) status post palliative radiotherapy to the left hip between 06/12 to 09/30/2013 at [facility].  status post stereotactic radiotherapy to brain lesions under the care of Dr. Katherine Roan at [facility] on 11/17/2013 2) Systemic chemotherapy with carboplatin for AUC of 5, Alimta 500 mg/M2 and Avastin 15 mg/KG every 3 weeks, status post 5 cycles. The first 4 cycles of her treatments were given at [facility] under the care of Dr. Oretha Caprice. 3) immunotherapy with Nivolmab at 3 mg/kg given every 3 weeks. Status post 16 cycles discontinued secondary to disease progression. 4) Gilotrif 40 mg by mouth daily for a patient with positive ERBB3 amplification. Therapy starting 08/29/2014. The dose was reduced to 30 mg every other day secondary to interval recurrence. Status post approximately 2 month of therapy. Discontinued 11/17/2014 secondary to disease progression.  CURRENT THERAPY:  1) Xgeva 120 mcg subcutaneously every 2 months. 2) Gemcitabine 1000 mg/m on days 1 and 8 every 3 weeks. First cycle beginning 11/24/2014.     INTERVAL HISTORY:   Sherri Stafford returns as scheduled. She began cycle 1 gemcitabine on 11/24/2014. She had nausea with intermittent vomiting for 2 days following the chemotherapy. The nausea was relieved with Compazine. No fever. No rash. She  denies shortness of breath. No cough. Bowel movement every other day. She notes a poor energy level. Appetite is poor. She would like to resume Marinol.  Objective:  Vital signs in last 24 hours:  Blood pressure 101/76, pulse 104, temperature 98 F (36.7 C), temperature source Oral, resp. rate 16, height $RemoveBe'5\' 4"'tKVKRcqJW$  (1.626 m), weight 103 lb 11.2 oz (47.038 kg), last menstrual period 01/14/2008, SpO2 100 %.    HEENT: No thrush or ulcers. Resp: Lungs clear bilaterally. Cardio: Regular rate and rhythm. GI: Abdomen soft and nontender. No hepatomegaly. Vascular: No leg edema. Neuro: Alert and oriented.  Port-A-Cath without erythema.   Lab Results:  Lab Results  Component Value Date   WBC 4.4 12/01/2014   HGB 9.9* 12/01/2014   HCT 30.5* 12/01/2014   MCV 80.3 12/01/2014   PLT 138* 12/01/2014   NEUTROABS 3.4 12/01/2014    Imaging:  No results found.  Medications: I have reviewed the patient's current medications.  Assessment/Plan: 1. Metastatic non-small cell lung cancer, adenocarcinoma status post multiple treatment regimens including carboplatin, Alimta and Avastin followed by treatment with Nivolumab for 16 cycles and most recently treated with oral Gilotrif with disease progression. Gemcitabine day 1 day 8 every 3 weeks initiated 11/24/2014.  Disposition: Sherri Stafford completed cycle 1 day 1 gemcitabine on 11/24/2014. Plan to proceed with day 8 today as scheduled.  For the anorexia/weight loss she will resume Marinol 2.5 mg twice daily. She will begin nutritional supplements as well.  She will return for a follow-up visit and cycle 2 gemcitabine on 12/15/2014.  Plan reviewed with Dr. Julien Nordmann.    Ned Card ANP/GNP-BC   12/01/2014  12:26 PM

## 2014-12-01 NOTE — Patient Instructions (Signed)
[facility] Discharge Instructions for Patients Receiving Chemotherapy  Today you received the following chemotherapy agents: Gemzar  To help prevent nausea and vomiting after your treatment, we encourage you to take your nausea medication: Compazine. Take one every 6 hours as needed.  If you develop nausea and vomiting that is not controlled by your nausea medication, call the clinic.   BELOW ARE SYMPTOMS THAT SHOULD BE REPORTED IMMEDIATELY:  *FEVER GREATER THAN 100.5 F  *CHILLS WITH OR WITHOUT FEVER  NAUSEA AND VOMITING THAT IS NOT CONTROLLED WITH YOUR NAUSEA MEDICATION  *UNUSUAL SHORTNESS OF BREATH  *UNUSUAL BRUISING OR BLEEDING  TENDERNESS IN MOUTH AND THROAT WITH OR WITHOUT PRESENCE OF ULCERS  *URINARY PROBLEMS  *BOWEL PROBLEMS  UNUSUAL RASH Items with * indicate a potential emergency and should be followed up as soon as possible.  Feel free to call the clinic should you have any questions or concerns. The clinic phone number is [phone].  Please show the CHEMO ALERT CARD at check-in to the Emergency Department and triage nurse.   

## 2014-12-02 ENCOUNTER — Other Ambulatory Visit: Payer: Self-pay | Admitting: Nurse Practitioner

## 2014-12-02 ENCOUNTER — Telehealth: Payer: Self-pay | Admitting: *Deleted

## 2014-12-02 ENCOUNTER — Ambulatory Visit (HOSPITAL_BASED_OUTPATIENT_CLINIC_OR_DEPARTMENT_OTHER): Payer: BC Managed Care – PPO

## 2014-12-02 VITALS — BP 95/51 | HR 79 | Temp 98.0°F | Resp 18

## 2014-12-02 DIAGNOSIS — C7889 Secondary malignant neoplasm of other digestive organs: Secondary | ICD-10-CM

## 2014-12-02 DIAGNOSIS — C7931 Secondary malignant neoplasm of brain: Secondary | ICD-10-CM | POA: Diagnosis not present

## 2014-12-02 DIAGNOSIS — E860 Dehydration: Secondary | ICD-10-CM

## 2014-12-02 DIAGNOSIS — C7951 Secondary malignant neoplasm of bone: Secondary | ICD-10-CM | POA: Diagnosis not present

## 2014-12-02 DIAGNOSIS — C3432 Malignant neoplasm of lower lobe, left bronchus or lung: Secondary | ICD-10-CM

## 2014-12-02 DIAGNOSIS — C3490 Malignant neoplasm of unspecified part of unspecified bronchus or lung: Secondary | ICD-10-CM

## 2014-12-02 DIAGNOSIS — C787 Secondary malignant neoplasm of liver and intrahepatic bile duct: Secondary | ICD-10-CM

## 2014-12-02 DIAGNOSIS — R110 Nausea: Secondary | ICD-10-CM | POA: Diagnosis not present

## 2014-12-02 DIAGNOSIS — C770 Secondary and unspecified malignant neoplasm of lymph nodes of head, face and neck: Secondary | ICD-10-CM

## 2014-12-02 MED ORDER — SODIUM CHLORIDE 0.9 % IV SOLN
Freq: Once | INTRAVENOUS | Status: AC
  Administered 2014-12-02: 13:00:00 via INTRAVENOUS

## 2014-12-02 MED ORDER — SODIUM CHLORIDE 0.9 % IJ SOLN
10.0000 mL | INTRAMUSCULAR | Status: DC | PRN
  Administered 2014-12-02: 10 mL via INTRAVENOUS
  Filled 2014-12-02: qty 10

## 2014-12-02 MED ORDER — SODIUM CHLORIDE 0.9 % IV SOLN
4.0000 mg | Freq: Once | INTRAVENOUS | Status: AC
  Administered 2014-12-02: 4 mg via INTRAVENOUS
  Filled 2014-12-02: qty 0.4

## 2014-12-02 MED ORDER — HEPARIN SOD (PORK) LOCK FLUSH 100 UNIT/ML IV SOLN
500.0000 [IU] | Freq: Once | INTRAVENOUS | Status: AC
  Administered 2014-12-02: 500 [IU] via INTRAVENOUS
  Filled 2014-12-02: qty 5

## 2014-12-02 NOTE — Patient Instructions (Signed)
Dehydration, Adult Dehydration is when you lose more fluids from the body than you take in. Vital organs like the kidneys, brain, and heart cannot function without a proper amount of fluids and salt. Any loss of fluids from the body can cause dehydration.  CAUSES   Vomiting.  Diarrhea.  Excessive sweating.  Excessive urine output.  Fever. SYMPTOMS  Mild dehydration  Thirst.  Dry lips.  Slightly dry mouth. Moderate dehydration  Very dry mouth.  Sunken eyes.  Skin does not bounce back quickly when lightly pinched and released.  Dark urine and decreased urine production.  Decreased tear production.  Headache. Severe dehydration  Very dry mouth.  Extreme thirst.  Rapid, weak pulse (more than 100 beats per minute at rest).  Cold hands and feet.  Not able to sweat in spite of heat and temperature.  Rapid breathing.  Blue lips.  Confusion and lethargy.  Difficulty being awakened.  Minimal urine production.  No tears. DIAGNOSIS  Your caregiver will diagnose dehydration based on your symptoms and your exam. Blood and urine tests will help confirm the diagnosis. The diagnostic evaluation should also identify the cause of dehydration. TREATMENT  Treatment of mild or moderate dehydration can often be done at home by increasing the amount of fluids that you drink. It is best to drink small amounts of fluid more often. Drinking too much at one time can make vomiting worse. Refer to the home care instructions below. Severe dehydration needs to be treated at the hospital where you will probably be given intravenous (IV) fluids that contain water and electrolytes. HOME CARE INSTRUCTIONS   Ask your caregiver about specific rehydration instructions.  Drink enough fluids to keep your urine clear or pale yellow.  Drink small amounts frequently if you have nausea and vomiting.  Eat as you normally do.  Avoid:  Foods or drinks high in sugar.  Carbonated  drinks.  Juice.  Extremely hot or cold fluids.  Drinks with caffeine.  Fatty, greasy foods.  Alcohol.  Tobacco.  Overeating.  Gelatin desserts.  Wash your hands well to avoid spreading bacteria and viruses.  Only take over-the-counter or prescription medicines for pain, discomfort, or fever as directed by your caregiver.  Ask your caregiver if you should continue all prescribed and over-the-counter medicines.  Keep all follow-up appointments with your caregiver. SEEK MEDICAL CARE IF:  You have abdominal pain and it increases or stays in one area (localizes).  You have a rash, stiff neck, or severe headache.  You are irritable, sleepy, or difficult to awaken.  You are weak, dizzy, or extremely thirsty. SEEK IMMEDIATE MEDICAL CARE IF:   You are unable to keep fluids down or you get worse despite treatment.  You have frequent episodes of vomiting or diarrhea.  You have blood or green matter (bile) in your vomit.  You have blood in your stool or your stool looks black and tarry.  You have not urinated in 6 to 8 hours, or you have only urinated a small amount of very dark urine.  You have a fever.  You faint. MAKE SURE YOU:   Understand these instructions.  Will watch your condition.  Will get help right away if you are not doing well or get worse. Document Released: 04/01/2005 Document Revised: 06/24/2011 Document Reviewed: 11/19/2010 ExitCare Patient Information 2015 [employer]. This information is not intended to replace advice given to you by your health care provider. Make sure you discuss any questions you have with your health care   provider.  

## 2014-12-02 NOTE — Telephone Encounter (Signed)
PT. HAS VOMITED ONCE. HER FLUID INTAKE IN THE PAST 24 HOURS HAS BEEN EIGHT OUNCES. NO DIARRHEA. VERBAL ORDER AND READ BACK TO CYNDEE BACON,NP- PT. TO COME TO OFFICE FOR IV FLUIDS. NOTIFIED PT. TO COME TO THE OFFICE AT 1:00PM FOR IV FLUIDS. POF TO SCHEDULER.

## 2014-12-02 NOTE — Progress Notes (Addendum)
Late entry for 1445: Pt reports mild nausea. She is unable to take Zofran or Ativan. Took Decadron 2 mg this AM, instructed her to increase Decadron to 4 mg QAM, repeat in the afternoon PRN. Discussed with Drue Second, NP: Order received for Decadron 10 mg IV push today. Pt requested lower dose, as this causes insomnia. Referral made to dietician, pt has lost 15 lbs in 2 mos. Drinking Boost occasionally, usually makes her vomit. Samples of Ensure clear and Boost Breeze given.  Pt worked in for IV fluids on 8/20. She will come in if she is unable to tolerate POs at home.   Pt reported improvement in nausea before discharge today.

## 2014-12-03 ENCOUNTER — Ambulatory Visit (HOSPITAL_BASED_OUTPATIENT_CLINIC_OR_DEPARTMENT_OTHER): Payer: BC Managed Care – PPO

## 2014-12-03 VITALS — BP 96/60 | HR 72 | Temp 97.8°F | Resp 16

## 2014-12-03 DIAGNOSIS — R110 Nausea: Secondary | ICD-10-CM | POA: Diagnosis not present

## 2014-12-03 DIAGNOSIS — C3432 Malignant neoplasm of lower lobe, left bronchus or lung: Secondary | ICD-10-CM | POA: Diagnosis not present

## 2014-12-03 DIAGNOSIS — C3490 Malignant neoplasm of unspecified part of unspecified bronchus or lung: Secondary | ICD-10-CM

## 2014-12-03 MED ORDER — HEPARIN SOD (PORK) LOCK FLUSH 100 UNIT/ML IV SOLN
500.0000 [IU] | Freq: Once | INTRAVENOUS | Status: AC
  Administered 2014-12-03: 500 [IU] via INTRAVENOUS
  Filled 2014-12-03: qty 5

## 2014-12-03 MED ORDER — SODIUM CHLORIDE 0.9 % IJ SOLN
10.0000 mL | Freq: Once | INTRAMUSCULAR | Status: AC
  Administered 2014-12-03: 10 mL via INTRAVENOUS
  Filled 2014-12-03: qty 10

## 2014-12-03 MED ORDER — SODIUM CHLORIDE 0.9 % IV SOLN
INTRAVENOUS | Status: DC
  Administered 2014-12-03: 09:00:00 via INTRAVENOUS

## 2014-12-03 NOTE — Patient Instructions (Signed)
Dehydration, Adult Dehydration is when you lose more fluids from the body than you take in. Vital organs like the kidneys, brain, and heart cannot function without a proper amount of fluids and salt. Any loss of fluids from the body can cause dehydration.  CAUSES   Vomiting.  Diarrhea.  Excessive sweating.  Excessive urine output.  Fever. SYMPTOMS  Mild dehydration  Thirst.  Dry lips.  Slightly dry mouth. Moderate dehydration  Very dry mouth.  Sunken eyes.  Skin does not bounce back quickly when lightly pinched and released.  Dark urine and decreased urine production.  Decreased tear production.  Headache. Severe dehydration  Very dry mouth.  Extreme thirst.  Rapid, weak pulse (more than 100 beats per minute at rest).  Cold hands and feet.  Not able to sweat in spite of heat and temperature.  Rapid breathing.  Blue lips.  Confusion and lethargy.  Difficulty being awakened.  Minimal urine production.  No tears. DIAGNOSIS  Your caregiver will diagnose dehydration based on your symptoms and your exam. Blood and urine tests will help confirm the diagnosis. The diagnostic evaluation should also identify the cause of dehydration. TREATMENT  Treatment of mild or moderate dehydration can often be done at home by increasing the amount of fluids that you drink. It is best to drink small amounts of fluid more often. Drinking too much at one time can make vomiting worse. Refer to the home care instructions below. Severe dehydration needs to be treated at the hospital where you will probably be given intravenous (IV) fluids that contain water and electrolytes. HOME CARE INSTRUCTIONS   Ask your caregiver about specific rehydration instructions.  Drink enough fluids to keep your urine clear or pale yellow.  Drink small amounts frequently if you have nausea and vomiting.  Eat as you normally do.  Avoid:  Foods or drinks high in sugar.  Carbonated  drinks.  Juice.  Extremely hot or cold fluids.  Drinks with caffeine.  Fatty, greasy foods.  Alcohol.  Tobacco.  Overeating.  Gelatin desserts.  Wash your hands well to avoid spreading bacteria and viruses.  Only take over-the-counter or prescription medicines for pain, discomfort, or fever as directed by your caregiver.  Ask your caregiver if you should continue all prescribed and over-the-counter medicines.  Keep all follow-up appointments with your caregiver. SEEK MEDICAL CARE IF:  You have abdominal pain and it increases or stays in one area (localizes).  You have a rash, stiff neck, or severe headache.  You are irritable, sleepy, or difficult to awaken.  You are weak, dizzy, or extremely thirsty. SEEK IMMEDIATE MEDICAL CARE IF:   You are unable to keep fluids down or you get worse despite treatment.  You have frequent episodes of vomiting or diarrhea.  You have blood or green matter (bile) in your vomit.  You have blood in your stool or your stool looks black and tarry.  You have not urinated in 6 to 8 hours, or you have only urinated a small amount of very dark urine.  You have a fever.  You faint. MAKE SURE YOU:   Understand these instructions.  Will watch your condition.  Will get help right away if you are not doing well or get worse. Document Released: 04/01/2005 Document Revised: 06/24/2011 Document Reviewed: 11/19/2010 ExitCare Patient Information 2015 [employer]. This information is not intended to replace advice given to you by your health care provider. Make sure you discuss any questions you have with your health care   provider.  

## 2014-12-14 ENCOUNTER — Other Ambulatory Visit: Payer: Self-pay | Admitting: Medical Oncology

## 2014-12-14 DIAGNOSIS — C3490 Malignant neoplasm of unspecified part of unspecified bronchus or lung: Secondary | ICD-10-CM

## 2014-12-15 ENCOUNTER — Other Ambulatory Visit (HOSPITAL_BASED_OUTPATIENT_CLINIC_OR_DEPARTMENT_OTHER): Payer: BC Managed Care – PPO

## 2014-12-15 ENCOUNTER — Telehealth: Payer: Self-pay | Admitting: Nurse Practitioner

## 2014-12-15 ENCOUNTER — Ambulatory Visit (HOSPITAL_BASED_OUTPATIENT_CLINIC_OR_DEPARTMENT_OTHER): Payer: BC Managed Care – PPO

## 2014-12-15 ENCOUNTER — Ambulatory Visit: Payer: BC Managed Care – PPO

## 2014-12-15 ENCOUNTER — Ambulatory Visit (HOSPITAL_BASED_OUTPATIENT_CLINIC_OR_DEPARTMENT_OTHER): Payer: BC Managed Care – PPO | Admitting: Nurse Practitioner

## 2014-12-15 ENCOUNTER — Ambulatory Visit: Payer: BC Managed Care – PPO | Admitting: Physician Assistant

## 2014-12-15 VITALS — BP 91/65 | HR 113 | Temp 98.6°F | Ht 64.0 in | Wt 107.3 lb

## 2014-12-15 DIAGNOSIS — C3432 Malignant neoplasm of lower lobe, left bronchus or lung: Secondary | ICD-10-CM

## 2014-12-15 DIAGNOSIS — C7951 Secondary malignant neoplasm of bone: Secondary | ICD-10-CM

## 2014-12-15 DIAGNOSIS — C787 Secondary malignant neoplasm of liver and intrahepatic bile duct: Secondary | ICD-10-CM | POA: Diagnosis not present

## 2014-12-15 DIAGNOSIS — Z7901 Long term (current) use of anticoagulants: Secondary | ICD-10-CM

## 2014-12-15 DIAGNOSIS — T451X5A Adverse effect of antineoplastic and immunosuppressive drugs, initial encounter: Secondary | ICD-10-CM

## 2014-12-15 DIAGNOSIS — C3490 Malignant neoplasm of unspecified part of unspecified bronchus or lung: Secondary | ICD-10-CM

## 2014-12-15 DIAGNOSIS — R630 Anorexia: Secondary | ICD-10-CM

## 2014-12-15 DIAGNOSIS — C7889 Secondary malignant neoplasm of other digestive organs: Secondary | ICD-10-CM

## 2014-12-15 DIAGNOSIS — D6481 Anemia due to antineoplastic chemotherapy: Secondary | ICD-10-CM | POA: Diagnosis not present

## 2014-12-15 DIAGNOSIS — Z95828 Presence of other vascular implants and grafts: Secondary | ICD-10-CM

## 2014-12-15 DIAGNOSIS — Z86711 Personal history of pulmonary embolism: Secondary | ICD-10-CM

## 2014-12-15 DIAGNOSIS — C770 Secondary and unspecified malignant neoplasm of lymph nodes of head, face and neck: Secondary | ICD-10-CM

## 2014-12-15 DIAGNOSIS — Z5111 Encounter for antineoplastic chemotherapy: Secondary | ICD-10-CM

## 2014-12-15 LAB — CBC WITH DIFFERENTIAL/PLATELET
BASO%: 0.3 % (ref 0.0–2.0)
BASOS ABS: 0 10*3/uL (ref 0.0–0.1)
EOS%: 3.9 % (ref 0.0–7.0)
Eosinophils Absolute: 0.3 10*3/uL (ref 0.0–0.5)
HCT: 27.4 % — ABNORMAL LOW (ref 34.8–46.6)
HEMOGLOBIN: 8.8 g/dL — AB (ref 11.6–15.9)
LYMPH%: 6.8 % — ABNORMAL LOW (ref 14.0–49.7)
MCH: 26.3 pg (ref 25.1–34.0)
MCHC: 32 g/dL (ref 31.5–36.0)
MCV: 82.2 fL (ref 79.5–101.0)
MONO#: 0.8 10*3/uL (ref 0.1–0.9)
MONO%: 10.8 % (ref 0.0–14.0)
NEUT#: 5.6 10*3/uL (ref 1.5–6.5)
NEUT%: 78.2 % — ABNORMAL HIGH (ref 38.4–76.8)
Platelets: 267 10*3/uL (ref 145–400)
RBC: 3.34 10*6/uL — AB (ref 3.70–5.45)
RDW: 19.7 % — AB (ref 11.2–14.5)
WBC: 7.2 10*3/uL (ref 3.9–10.3)
lymph#: 0.5 10*3/uL — ABNORMAL LOW (ref 0.9–3.3)

## 2014-12-15 LAB — COMPREHENSIVE METABOLIC PANEL (CC13)
ALBUMIN: 3 g/dL — AB (ref 3.5–5.0)
ALT: 9 U/L (ref 0–55)
AST: 10 U/L (ref 5–34)
Alkaline Phosphatase: 99 U/L (ref 40–150)
Anion Gap: 7 mEq/L (ref 3–11)
BUN: 12.3 mg/dL (ref 7.0–26.0)
CHLORIDE: 103 meq/L (ref 98–109)
CO2: 26 meq/L (ref 22–29)
Calcium: 8.5 mg/dL (ref 8.4–10.4)
Creatinine: 0.7 mg/dL (ref 0.6–1.1)
EGFR: >90 mL/min/{1.73_m2} (ref >90)
GLUCOSE: 86 mg/dL (ref 70–140)
POTASSIUM: 4.1 meq/L (ref 3.5–5.1)
SODIUM: 135 meq/L — AB (ref 136–145)
Total Bilirubin: 0.97 mg/dL (ref 0.20–1.20)
Total Protein: 5.8 g/dL — ABNORMAL LOW (ref 6.4–8.3)

## 2014-12-15 MED ORDER — FENTANYL 50 MCG/HR TD PT72: 50.0000 ug | MEDICATED_PATCH | TRANSDERMAL | Status: DC

## 2014-12-15 MED ORDER — SODIUM CHLORIDE 0.9 % IJ SOLN
10.0000 mL | INTRAMUSCULAR | Status: DC | PRN
  Administered 2014-12-15: 10 mL
  Filled 2014-12-15: qty 10

## 2014-12-15 MED ORDER — PROCHLORPERAZINE MALEATE 10 MG PO TABS
ORAL_TABLET | ORAL | Status: AC
  Filled 2014-12-15: qty 1

## 2014-12-15 MED ORDER — GEMCITABINE HCL CHEMO INJECTION 1 GM/26.3ML
1000.0000 mg/m2 | Freq: Once | INTRAVENOUS | Status: AC
  Administered 2014-12-15: 1520 mg via INTRAVENOUS
  Filled 2014-12-15: qty 39.98

## 2014-12-15 MED ORDER — SODIUM CHLORIDE 0.9 % IJ SOLN
10.0000 mL | INTRAMUSCULAR | Status: DC | PRN
  Administered 2014-12-15: 10 mL via INTRAVENOUS
  Filled 2014-12-15: qty 10

## 2014-12-15 MED ORDER — PROCHLORPERAZINE MALEATE 10 MG PO TABS
10.0000 mg | ORAL_TABLET | Freq: Once | ORAL | Status: AC
  Administered 2014-12-15: 10 mg via ORAL

## 2014-12-15 MED ORDER — SODIUM CHLORIDE 0.9 % IV SOLN
Freq: Once | INTRAVENOUS | Status: AC
  Administered 2014-12-15: 13:00:00 via INTRAVENOUS

## 2014-12-15 MED ORDER — HEPARIN SOD (PORK) LOCK FLUSH 100 UNIT/ML IV SOLN
500.0000 [IU] | Freq: Once | INTRAVENOUS | Status: AC | PRN
  Administered 2014-12-15: 500 [IU]
  Filled 2014-12-15: qty 5

## 2014-12-15 NOTE — Patient Instructions (Signed)
[facility] Discharge Instructions for Patients Receiving Chemotherapy  Today you received the following chemotherapy agents Gemzar  To help prevent nausea and vomiting after your treatment, we encourage you to take your nausea medication as directed.    If you develop nausea and vomiting that is not controlled by your nausea medication, call the clinic.   BELOW ARE SYMPTOMS THAT SHOULD BE REPORTED IMMEDIATELY:  *FEVER GREATER THAN 100.5 F  *CHILLS WITH OR WITHOUT FEVER  NAUSEA AND VOMITING THAT IS NOT CONTROLLED WITH YOUR NAUSEA MEDICATION  *UNUSUAL SHORTNESS OF BREATH  *UNUSUAL BRUISING OR BLEEDING  TENDERNESS IN MOUTH AND THROAT WITH OR WITHOUT PRESENCE OF ULCERS  *URINARY PROBLEMS  *BOWEL PROBLEMS  UNUSUAL RASH Items with * indicate a potential emergency and should be followed up as soon as possible.  Feel free to call the clinic you have any questions or concerns. The clinic phone number is [phone].  Please show the CHEMO ALERT CARD at check-in to the Emergency Department and triage nurse.   

## 2014-12-15 NOTE — Patient Instructions (Signed)

## 2014-12-15 NOTE — Telephone Encounter (Signed)
per Jenny Reichmann to move pt from [place] to her sch-pt moved

## 2014-12-20 ENCOUNTER — Encounter: Payer: Self-pay | Admitting: Nurse Practitioner

## 2014-12-20 DIAGNOSIS — D6481 Anemia due to antineoplastic chemotherapy: Secondary | ICD-10-CM | POA: Insufficient documentation

## 2014-12-20 DIAGNOSIS — T451X5A Adverse effect of antineoplastic and immunosuppressive drugs, initial encounter: Secondary | ICD-10-CM

## 2014-12-20 NOTE — Assessment & Plan Note (Signed)
Patient is very happy to report that her nausea, vomiting, and chronic diarrhea has essentially resolved with the changing her chemotherapy regimen.  She has been taking Marinol for treatment of her anorexia; and states that she has regained much of her appetite.

## 2014-12-20 NOTE — Assessment & Plan Note (Signed)
Patient has a history of pulmonary embolism in the past month.:  And continues to take Xarelto on a regular basis.

## 2014-12-20 NOTE — Progress Notes (Signed)
SYMPTOM MANAGEMENT CLINIC   HPI: Sherri Stafford 64 y.o. female diagnosed with lung cancer; with liver metastasis.  Currently undergoing gemcitabine chemotherapy and Xgeva therapy.  Patient received cycle 1, day 8 of her single agent gemcitabine chemotherapy on 12/01/2014.  She last received her rate.  She is a on 11/09/2014.  Patient states that she is tolerating this new chemotherapy regimen much better.  She continues to complain of some mild, chronic fatigue; but reports that she is no longer suffering from nausea, vomiting, or diarrhea.  She is also having to report that her appetite has improved with the use of Marinol.  Patient denies any recent fevers or chills. HPI  ROS  Past Medical History  Diagnosis Date  . Arthritis   . Bilateral ovarian cysts   . GERD (gastroesophageal reflux disease)   . Strain of hip flexor 06/2013    left side torn  . Radiation     at [facility] to left hip, sacrum and brain  . Radiation 02/10/14-03/06/14    left central chest 35 gray  . Encounter for antineoplastic chemotherapy 09/19/2014  . Cancer     lung ca  . Bone metastases     to left hip and spine  . Radiation 10/12/14-10/26/14    pelvis region 20 gray    Past Surgical History  Procedure Laterality Date  . Dilation and curettage of uterus  2004    x 3   . Tubal ligation    . Colonoscopy  04/05/2004    normal     has Nonspecific abnormal results of liver function study; Cough; Sarcoidosis; Unspecified vitamin D deficiency; Dyspnea; Adenocarcinoma of lung; Chest pain; Pulmonary embolism; Acute bronchitis; Bone metastases; Abdominal pain; Nausea without vomiting; Long term current use of anticoagulant therapy; Dysuria; Dehydration; Hyperbilirubinemia; Hypoalbuminemia; Neoplasm related pain; Anemia in neoplastic disease; Itching; Constipation; Encounter for antineoplastic chemotherapy; Mucositis due to chemotherapy; Diarrhea; Anorexia; Liver metastases; and Antineoplastic chemotherapy induced  anemia on her problem list.    is allergic to other; ketoconazole; lorazepam; morphine and related; nystatin; sporanox; tylenol; vitamin d; and zofran.    Medication List       This list is accurate as of: 12/15/14 11:59 PM.  Always use your most recent med list.               albuterol 108 (90 BASE) MCG/ACT inhaler  Commonly known as:  PROVENTIL HFA;VENTOLIN HFA  Inhale 1-2 puffs into the lungs every 6 (six) hours as needed for wheezing or shortness of breath.     bisacodyl 10 MG suppository  Commonly known as:  DULCOLAX  Place 1 suppository (10 mg total) rectally daily as needed for moderate constipation.     calcium carbonate 1250 (500 CA) MG chewable tablet  Commonly known as:  OS-CAL  Chew 4 tablets by mouth daily.     clindamycin 1 % external solution  Commonly known as:  CLEOCIN-T  Apply topically 2 (two) times daily.     [employer] SENNA PLUS 8.6-50 MG per tablet  Generic drug:  senna-docusate     dexamethasone 4 MG tablet  Commonly known as:  DECADRON  May take 1/2 to 1 tab PO BID PRN nausea.     diphenhydrAMINE 25 MG tablet  Commonly known as:  SOMINEX  Take 25 mg by mouth at bedtime as needed for sleep. Takes 2 tablets every 3-4 hours during the night.     diphenoxylate-atropine 2.5-0.025 MG per tablet  Commonly known as:  LOMOTIL  Take 2 tablets by mouth 4 (four) times daily as needed for diarrhea or loose stools.     docusate sodium 100 MG capsule  Commonly known as:  COLACE  Take 100 mg by mouth 2 (two) times daily.     dronabinol 2.5 MG capsule  Commonly known as:  MARINOL  Take 1 capsule (2.5 mg total) by mouth 2 (two) times daily before a meal.     fentaNYL 50 MCG/HR  Commonly known as:  DURAGESIC - dosed mcg/hr  Place 1 patch (50 mcg total) onto the skin every 3 (three) days.     glycerin adult 2 G Supp  Place 1 suppository rectally once as needed for moderate constipation.     magic mouthwash w/lidocaine Soln  Take 5 mLs by mouth 4 (four) times  daily as needed for mouth pain.     naproxen sodium 220 MG tablet  Commonly known as:  ANAPROX  Take 220 mg by mouth 2 (two) times daily with a meal.     OLANZapine zydis 10 MG disintegrating tablet  Commonly known as:  ZYPREXA ZYDIS  For nausea     polyethylene glycol packet  Commonly known as:  MIRALAX / GLYCOLAX  Take 17 g by mouth daily as needed for moderate constipation.     prochlorperazine 10 MG tablet  Commonly known as:  COMPAZINE  Take 1 tablet (10 mg total) by mouth every 6 (six) hours as needed for nausea or vomiting.     rivaroxaban 20 MG Tabs tablet  Commonly known as:  XARELTO  Take 1 tablet (20 mg total) by mouth daily with supper.     sucralfate 1 GM/10ML suspension  Commonly known as:  CARAFATE  Take 10 mLs (1 g total) by mouth 4 (four) times daily -  with meals and at bedtime.     traMADol 50 MG tablet  Commonly known as:  ULTRAM  TAKE 1 TABLET BY MOUTH EVERY 6 HOURS AS NEEDED         PHYSICAL EXAMINATION  Oncology Vitals 12/15/2014 12/03/2014 12/03/2014 12/02/2014 12/02/2014 12/01/2014 12/01/2014  Height 163 cm - - - - - 163 cm  Weight 48.671 kg - - - - - 47.038 kg  Weight (lbs) 107 lbs 5 oz - - - - - 103 lbs 11 oz  BMI (kg/m2) 18.42 kg/m2 - - - - - 17.8 kg/m2  Temp 98.6 - 97.8 98 97.7 - 98  Pulse 113 72 80 79 102 104 115  Resp - '16 14 18 16 ' - 16  SpO2 100 - 100 100 100 - 100  BSA (m2) 1.48 m2 - - - - - 1.46 m2   BP Readings from Last 3 Encounters:  12/15/14 91/65  12/03/14 96/60  12/02/14 95/51    Physical Exam  Constitutional: She is oriented to person, place, and time.  Patient appears fatigued, weak, frail, and chronically ill.  HENT:  Head: Normocephalic and atraumatic.  Mouth/Throat: Oropharynx is clear and moist.  Eyes: Conjunctivae and EOM are normal. Pupils are equal, round, and reactive to light. Right eye exhibits no discharge. Left eye exhibits no discharge. No scleral icterus.  Neck: Normal range of motion. Neck supple. No JVD  present. No tracheal deviation present. No thyromegaly present.  Cardiovascular: Normal rate, regular rhythm, normal heart sounds and intact distal pulses.   Pulmonary/Chest: Effort normal and breath sounds normal. No respiratory distress. She has no wheezes. She has no rales. She exhibits no tenderness.  Abdominal: Soft. Bowel  sounds are normal. She exhibits no distension and no mass. There is no tenderness. There is no rebound and no guarding.  Musculoskeletal: Normal range of motion. She exhibits no edema or tenderness.  Lymphadenopathy:    She has no cervical adenopathy.  Neurological: She is alert and oriented to person, place, and time.  Skin: Skin is warm and dry. No rash noted. No erythema. There is pallor.  Psychiatric: Affect normal.  Nursing note and vitals reviewed.   LABORATORY DATA:. Appointment on 12/15/2014  Component Date Value Ref Range Status  . WBC 12/15/2014 7.2  3.9 - 10.3 10e3/uL Final  . NEUT# 12/15/2014 5.6  1.5 - 6.5 10e3/uL Final  . HGB 12/15/2014 8.8* 11.6 - 15.9 g/dL Final  . HCT 12/15/2014 27.4* 34.8 - 46.6 % Final  . Platelets 12/15/2014 267  145 - 400 10e3/uL Final  . MCV 12/15/2014 82.2  79.5 - 101.0 fL Final  . MCH 12/15/2014 26.3  25.1 - 34.0 pg Final  . MCHC 12/15/2014 32.0  31.5 - 36.0 g/dL Final  . RBC 12/15/2014 3.34* 3.70 - 5.45 10e6/uL Final  . RDW 12/15/2014 19.7* 11.2 - 14.5 % Final  . lymph# 12/15/2014 0.5* 0.9 - 3.3 10e3/uL Final  . MONO# 12/15/2014 0.8  0.1 - 0.9 10e3/uL Final  . Eosinophils Absolute 12/15/2014 0.3  0.0 - 0.5 10e3/uL Final  . Basophils Absolute 12/15/2014 0.0  0.0 - 0.1 10e3/uL Final  . NEUT% 12/15/2014 78.2* 38.4 - 76.8 % Final  . LYMPH% 12/15/2014 6.8* 14.0 - 49.7 % Final  . MONO% 12/15/2014 10.8  0.0 - 14.0 % Final  . EOS% 12/15/2014 3.9  0.0 - 7.0 % Final  . BASO% 12/15/2014 0.3  0.0 - 2.0 % Final  . Sodium 12/15/2014 135* 136 - 145 mEq/L Final  . Potassium 12/15/2014 4.1  3.5 - 5.1 mEq/L Final  . Chloride  12/15/2014 103  98 - 109 mEq/L Final  . CO2 12/15/2014 26  22 - 29 mEq/L Final  . Glucose 12/15/2014 86  70 - 140 mg/dl Final  . BUN 12/15/2014 12.3  7.0 - 26.0 mg/dL Final  . Creatinine 12/15/2014 0.7  0.6 - 1.1 mg/dL Final  . Total Bilirubin 12/15/2014 0.97  0.20 - 1.20 mg/dL Final  . Alkaline Phosphatase 12/15/2014 99  40 - 150 U/L Final  . AST 12/15/2014 10  5 - 34 U/L Final  . ALT 12/15/2014 9  0 - 55 U/L Final  . Total Protein 12/15/2014 5.8* 6.4 - 8.3 g/dL Final  . Albumin 12/15/2014 3.0* 3.5 - 5.0 g/dL Final  . Calcium 12/15/2014 8.5  8.4 - 10.4 mg/dL Final  . Anion Gap 12/15/2014 7  3 - 11 mEq/L Final  . EGFR 12/15/2014 >90  >90 ml/min/1.73 m2 Final   eGFR is calculated using the CKD-EPI Creatinine Equation (2009)     RADIOGRAPHIC STUDIES: No results found.  ASSESSMENT/PLAN:    Adenocarcinoma of lung Patient received cycle 1, day 8 of her single agent gemcitabine chemotherapy on 12/01/2014.  She last received her rate.  She is a on 11/09/2014.  Patient states that she is tolerating this new chemotherapy regimen much better.  She continues to complain of some mild, chronic fatigue; but reports that she is no longer suffering from nausea, vomiting, or diarrhea.  She is also having to report that her appetite has improved with the use of Marinol.  Labs obtained today reveal a blood count with WBC 7.2, ANC 5.6, hemoglobin 8.8, and platelet count  267.  Patient will proceed today with cycle 2, day 1 of her gemcitabine chemotherapy regimen as directed.  Decision was made today to extend via the sheath a 2 every 2 months.  Therefore,-.  Patient will be due for her next cycle of Xgeva on approximately 01/10/2015.  Patient is scheduled to return for labs, visit, and cycle 2, day 8 of her chemotherapy on 12/22/2014.  Anorexia Patient is very happy to report that her nausea, vomiting, and chronic diarrhea has essentially resolved with the changing her chemotherapy regimen.  She has  been taking Marinol for treatment of her anorexia; and states that she has regained much of her appetite.  Long term current use of anticoagulant therapy Patient has a history of pulmonary embolism in the past month.:  And continues to take Xarelto on a regular basis.  Antineoplastic chemotherapy induced anemia Chemotherapy-induced anemia noted today with hemoglobin down to 8.8.  Patient is complaining of some mild increased chronic fatigue; but denies any shortness of breath with exertion.  Will continue to monitor patient's hemoglobin closely.  Patient stated understanding of all instructions; and was in agreement with this plan of care. The patient knows to call the clinic with any problems, questions or concerns.   This was a shared visit with Dr. Julien Nordmann today.  Total time spent with patient was 25 minutes;  with greater than 75 percent of that time spent in face to face counseling regarding patient's symptoms,  and coordination of care and follow up.  Disclaimer:This dictation was prepared with Dragon/digital dictation along with [employer]. Any transcriptional errors that result from this process are unintentional.  Drue Second, NP 12/20/2014   ADDENDUM: Hematology/Oncology Attending: I had a face to face encounter with the patient. I recommended her care plan. This is a very pleasant 64 years old white female with metastatic non-small cell lung cancer who is currently undergoing systemic chemotherapy with single agent gemcitabine on days 1 and 8 every 3 weeks status post 1 cycle. She tolerated the first cycle of her treatment fairly well except for fatigue. The patient also has lack of appetite and she is currently on treatment with Marinol. I recommended for the patient to proceed with cycle #2 today as a scheduled. We will continue to monitor the chemotherapy-induced anemia and consider the patient for PRBCs transfusion if her hemoglobin is less than 8.0 G/DL. For the  history of pulmonary embolism, the patient will continue on Xarelto. The patient would come back for follow-up visit in 3 weeks for reevaluation before starting cycle #3 of her treatment. She was advised to call immediately if she has any concerning symptoms in the interval.  Disclaimer: This note was dictated with voice recognition software. Similar sounding words can inadvertently be transcribed and may be missed upon review. Eilleen Kempf., MD 12/21/2014

## 2014-12-20 NOTE — Assessment & Plan Note (Signed)
Patient received cycle 1, day 8 of her single agent gemcitabine chemotherapy on 12/01/2014.  She last received her rate.  She is a on 11/09/2014.  Patient states that she is tolerating this new chemotherapy regimen much better.  She continues to complain of some mild, chronic fatigue; but reports that she is no longer suffering from nausea, vomiting, or diarrhea.  She is also having to report that her appetite has improved with the use of Marinol.  Labs obtained today reveal a blood count with WBC 7.2, ANC 5.6, hemoglobin 8.8, and platelet count 267.  Patient will proceed today with cycle 2, day 1 of her gemcitabine chemotherapy regimen as directed.  Decision was made today to extend via the sheath a 2 every 2 months.  Therefore,-.  Patient will be due for her next cycle of Xgeva on approximately 01/10/2015.  Patient is scheduled to return for labs, visit, and cycle 2, day 8 of her chemotherapy on 12/22/2014.

## 2014-12-20 NOTE — Assessment & Plan Note (Signed)
Chemotherapy-induced anemia noted today with hemoglobin down to 8.8.  Patient is complaining of some mild increased chronic fatigue; but denies any shortness of breath with exertion.  Will continue to monitor patient's hemoglobin closely.

## 2014-12-22 ENCOUNTER — Other Ambulatory Visit: Payer: Self-pay | Admitting: Medical Oncology

## 2014-12-22 ENCOUNTER — Encounter: Payer: Self-pay | Admitting: Internal Medicine

## 2014-12-22 ENCOUNTER — Ambulatory Visit (HOSPITAL_BASED_OUTPATIENT_CLINIC_OR_DEPARTMENT_OTHER): Payer: BC Managed Care – PPO | Admitting: Internal Medicine

## 2014-12-22 ENCOUNTER — Other Ambulatory Visit (HOSPITAL_BASED_OUTPATIENT_CLINIC_OR_DEPARTMENT_OTHER): Payer: BC Managed Care – PPO

## 2014-12-22 ENCOUNTER — Telehealth: Payer: Self-pay | Admitting: Internal Medicine

## 2014-12-22 ENCOUNTER — Ambulatory Visit: Payer: BC Managed Care – PPO

## 2014-12-22 ENCOUNTER — Ambulatory Visit (HOSPITAL_BASED_OUTPATIENT_CLINIC_OR_DEPARTMENT_OTHER): Payer: BC Managed Care – PPO

## 2014-12-22 VITALS — BP 102/67 | HR 105 | Temp 98.2°F | Resp 18 | Ht 64.0 in | Wt 103.9 lb

## 2014-12-22 DIAGNOSIS — C3432 Malignant neoplasm of lower lobe, left bronchus or lung: Secondary | ICD-10-CM

## 2014-12-22 DIAGNOSIS — C770 Secondary and unspecified malignant neoplasm of lymph nodes of head, face and neck: Secondary | ICD-10-CM

## 2014-12-22 DIAGNOSIS — C3490 Malignant neoplasm of unspecified part of unspecified bronchus or lung: Secondary | ICD-10-CM

## 2014-12-22 DIAGNOSIS — Z5111 Encounter for antineoplastic chemotherapy: Secondary | ICD-10-CM | POA: Diagnosis not present

## 2014-12-22 DIAGNOSIS — C3491 Malignant neoplasm of unspecified part of right bronchus or lung: Secondary | ICD-10-CM

## 2014-12-22 DIAGNOSIS — C7889 Secondary malignant neoplasm of other digestive organs: Secondary | ICD-10-CM

## 2014-12-22 DIAGNOSIS — C7951 Secondary malignant neoplasm of bone: Secondary | ICD-10-CM

## 2014-12-22 DIAGNOSIS — Z95828 Presence of other vascular implants and grafts: Secondary | ICD-10-CM

## 2014-12-22 DIAGNOSIS — C787 Secondary malignant neoplasm of liver and intrahepatic bile duct: Secondary | ICD-10-CM | POA: Diagnosis not present

## 2014-12-22 DIAGNOSIS — C7931 Secondary malignant neoplasm of brain: Secondary | ICD-10-CM | POA: Diagnosis not present

## 2014-12-22 LAB — COMPREHENSIVE METABOLIC PANEL (CC13)
ALT: 11 U/L (ref 0–55)
ANION GAP: 9 meq/L (ref 3–11)
AST: 13 U/L (ref 5–34)
Albumin: 3 g/dL — ABNORMAL LOW (ref 3.5–5.0)
Alkaline Phosphatase: 96 U/L (ref 40–150)
BILIRUBIN TOTAL: 0.99 mg/dL (ref 0.20–1.20)
BUN: 9.6 mg/dL (ref 7.0–26.0)
CHLORIDE: 103 meq/L (ref 98–109)
CO2: 25 meq/L (ref 22–29)
CREATININE: 0.6 mg/dL (ref 0.6–1.1)
Calcium: 8.6 mg/dL (ref 8.4–10.4)
EGFR: >90 mL/min/{1.73_m2} (ref >90)
GLUCOSE: 97 mg/dL (ref 70–140)
Potassium: 4 mEq/L (ref 3.5–5.1)
SODIUM: 137 meq/L (ref 136–145)
TOTAL PROTEIN: 6 g/dL — AB (ref 6.4–8.3)

## 2014-12-22 LAB — CBC WITH DIFFERENTIAL/PLATELET
BASO%: 0.6 % (ref 0.0–2.0)
Basophils Absolute: 0 10*3/uL (ref 0.0–0.1)
EOS%: 3 % (ref 0.0–7.0)
Eosinophils Absolute: 0.1 10*3/uL (ref 0.0–0.5)
HCT: 25.3 % — ABNORMAL LOW (ref 34.8–46.6)
HGB: 8.4 g/dL — ABNORMAL LOW (ref 11.6–15.9)
LYMPH#: 0.4 10*3/uL — AB (ref 0.9–3.3)
LYMPH%: 11 % — AB (ref 14.0–49.7)
MCH: 27.2 pg (ref 25.1–34.0)
MCHC: 33.1 g/dL (ref 31.5–36.0)
MCV: 82.2 fL (ref 79.5–101.0)
MONO#: 0.7 10*3/uL (ref 0.1–0.9)
MONO%: 17.9 % — ABNORMAL HIGH (ref 0.0–14.0)
NEUT%: 67.5 % (ref 38.4–76.8)
NEUTROS ABS: 2.5 10*3/uL (ref 1.5–6.5)
PLATELETS: 209 10*3/uL (ref 145–400)
RBC: 3.08 10*6/uL — AB (ref 3.70–5.45)
RDW: 19.8 % — ABNORMAL HIGH (ref 11.2–14.5)
WBC: 3.7 10*3/uL — AB (ref 3.9–10.3)

## 2014-12-22 LAB — TECHNOLOGIST REVIEW

## 2014-12-22 MED ORDER — SODIUM CHLORIDE 0.9 % IJ SOLN
10.0000 mL | INTRAMUSCULAR | Status: DC | PRN
  Administered 2014-12-22: 10 mL via INTRAVENOUS
  Filled 2014-12-22: qty 10

## 2014-12-22 MED ORDER — PROCHLORPERAZINE MALEATE 10 MG PO TABS
10.0000 mg | ORAL_TABLET | Freq: Once | ORAL | Status: AC
  Administered 2014-12-22: 10 mg via ORAL

## 2014-12-22 MED ORDER — SODIUM CHLORIDE 0.9 % IV SOLN
Freq: Once | INTRAVENOUS | Status: AC
  Administered 2014-12-22: 13:00:00 via INTRAVENOUS

## 2014-12-22 MED ORDER — PROCHLORPERAZINE MALEATE 10 MG PO TABS
ORAL_TABLET | ORAL | Status: AC
  Filled 2014-12-22: qty 1

## 2014-12-22 MED ORDER — GEMCITABINE HCL CHEMO INJECTION 1 GM/26.3ML
800.0000 mg/m2 | Freq: Once | INTRAVENOUS | Status: AC
  Administered 2014-12-22: 1216 mg via INTRAVENOUS
  Filled 2014-12-22: qty 31.98

## 2014-12-22 MED ORDER — SODIUM CHLORIDE 0.9 % IJ SOLN
10.0000 mL | INTRAMUSCULAR | Status: DC | PRN
  Administered 2014-12-22: 10 mL
  Filled 2014-12-22: qty 10

## 2014-12-22 MED ORDER — HEPARIN SOD (PORK) LOCK FLUSH 100 UNIT/ML IV SOLN
500.0000 [IU] | Freq: Once | INTRAVENOUS | Status: AC | PRN
  Administered 2014-12-22: 500 [IU]
  Filled 2014-12-22: qty 5

## 2014-12-22 NOTE — Progress Notes (Signed)
[place] [phone]   [phone]  OFFICE PROGRESS NOTE  [name], MD [place] [address]  DIAGNOSIS: stage IV (T2a, N3, M1b) non-small cell lung cancer, adenocarcinoma presented with large left lower lobe lung mass in addition to bilateral pulmonary nodules and mediastinal and supraclavicular lymphadenopathy diagnosed in May of 2015.  Genomic Alterations Identified? ERBB3 amplification CDK4 amplification IDH2 R172S KRAS G12D MDM2 amplification RBM10 G432f*36 TERC amplification - equivocal? Additional Disease-relevant Genes with No Reportable Alterations Identified? RET ALK BRAF ERBB2 MET EGFR  PRIOR THERAPY:  1) status post palliative [facility] the left [facility]  status post stereotactic radiotherapy to brain lesions under the care of Dr. KKatherine Roanat DBonneron 11/17/2013 2) Systemic chemotherapy with carboplatin for AUC of 5, Alimta 500 mg/M2 and Avastin 15 mg/KG every 3 weeks, status post 5 cycles. The [facility] Dr[facility] DOretha Caprice 3) immunotherapy with Nivolmab at 3 mg/kg given every 3 weeks. Status post 16 cycles discontinued today secondary to disease progression. 4)  Gilotrif 40 mg by mouth daily for a patient with positive ERBB3 amplification. Therapy starting 08/29/2014. The dose was reduced to 30 mg every other day secondary to interval recurrence. Status post approximately 2 month of therapy. Discontinued today secondary to disease progression.  CURRENT THERAPY:  1) systemic chemotherapy with single agent gemcitabine 1000 MG/M2 on days 1 and 8 every 3 weeks status post 1 cycle and she is currently undergoing cycle #2. 2) Xgeva 120 mcg subcutaneously every 2 months.  INTERVAL HISTORY: Sherri HEATHCOCK64y.o. female returns to  the clinic today for follow-up visit accompanied by her boyfriend. The patient is currently undergoing systemic chemotherapy with single agent gemcitabine status post 1 cycle in addition today 1 of cycle #2. She is tolerating her treatment fairly well except for the persistent fatigue. She is currently on Marinol 5 mg by mouth twice a day for the weight loss. She denied having any significant chest pain, shortness of breath, cough or hemoptysis. The patient denied having any current fever or chills. She is here today to start the rate of cycle #2.  MEDICAL HISTORY: Past Medical History  Diagnosis Date  . Arthritis   . Bilateral ovarian cysts   . GERD (gastroesophageal reflux disease)   . Strain of hip flexor 06/2013    left side torn  . Radiation     at [facility] to left hip, sacrum and brain  . Radiation 02/10/14-03/06/14    left central chest 35 gray  . Encounter for antineoplastic chemotherapy 09/19/2014  . Cancer     lung ca  . Bone metastases     to left hip and spine  . Radiation 10/12/14-10/26/14    pelvis region 20 gray    ALLERGIES:  is allergic to other; ketoconazole; lorazepam; morphine and related; nystatin; sporanox; tylenol; vitamin d; and zofran.  MEDICATIONS:  Current Outpatient Prescriptions  Medication Sig Dispense Refill  . albuterol (PROVENTIL HFA;VENTOLIN HFA) 108 (90 BASE) MCG/ACT inhaler Inhale 1-2 puffs into the lungs every 6 (six) hours as needed for wheezing or shortness of breath. 1 Inhaler 2  . dexamethasone (DECADRON) 4 MG tablet May take 1/2 to 1 tab PO BID PRN nausea. 30 tablet 1  . dronabinol (MARINOL) 2.5 MG capsule Take 1 capsule (2.5 mg total) by  mouth 2 (two) times daily before a meal. 60 capsule 0  . fentaNYL (DURAGESIC - DOSED MCG/HR) 50 MCG/HR Place 1 patch (50 mcg total) onto the skin every 3 (three) days. 10 patch 0  . bisacodyl (DULCOLAX) 10 MG suppository Place 1 suppository (10 mg total) rectally daily as needed for moderate constipation. (Patient not  taking: Reported on 12/22/2014) 10 suppository 0  . clindamycin (CLEOCIN-T) 1 % external solution Apply topically 2 (two) times daily. (Patient not taking: Reported on 12/22/2014) 30 mL 0  . [employer] SENNA PLUS 8.6-50 MG per tablet   11  . diphenoxylate-atropine (LOMOTIL) 2.5-0.025 MG per tablet Take 2 tablets by mouth 4 (four) times daily as needed for diarrhea or loose stools. (Patient not taking: Reported on 12/22/2014) 30 tablet 0  . docusate sodium (COLACE) 100 MG capsule Take 100 mg by mouth 2 (two) times daily.    Marland Kitchen glycerin adult 2 G SUPP Place 1 suppository rectally once as needed for moderate constipation.    Marland Kitchen OLANZapine zydis (ZYPREXA ZYDIS) 10 MG disintegrating tablet For nausea (Patient not taking: Reported on 12/22/2014) 30 tablet 0  . polyethylene glycol (MIRALAX / GLYCOLAX) packet Take 17 g by mouth daily as needed for moderate constipation.    . prochlorperazine (COMPAZINE) 10 MG tablet Take 1 tablet (10 mg total) by mouth every 6 (six) hours as needed for nausea or vomiting. (Patient not taking: Reported on 12/22/2014) 45 tablet 2  . rivaroxaban (XARELTO) 20 MG TABS tablet Take 1 tablet (20 mg total) by mouth daily with supper. 30 tablet 5  . sucralfate (CARAFATE) 1 GM/10ML suspension Take 10 mLs (1 g total) by mouth 4 (four) times daily -  with meals and at bedtime. 420 mL 0  . traMADol (ULTRAM) 50 MG tablet TAKE 1 TABLET BY MOUTH EVERY 6 HOURS AS NEEDED 60 tablet 0   No current facility-administered medications for this visit.   Facility-Administered Medications Ordered in Other Visits  Medication Dose Route Frequency Provider Last Rate Last Dose  . sodium chloride 0.9 % injection 10 mL  10 mL Intracatheter PRN Curt Bears, MD   10 mL at 04/07/14 1200  . sodium chloride 0.9 % injection 10 mL  10 mL Intravenous PRN Curt Bears, MD   10 mL at 11/16/14 0850    SURGICAL HISTORY:  Past Surgical History  Procedure Laterality Date  . Dilation and curettage of uterus  2004    x 3   .  Tubal ligation    . Colonoscopy  04/05/2004    normal     REVIEW OF SYSTEMS:  Constitutional: positive for anorexia, fatigue and weight loss Eyes: negative Ears, nose, mouth, throat, and face: negative Respiratory: positive for dyspnea on exertion Cardiovascular: negative Gastrointestinal: negative Genitourinary:negative Integument/breast: negative Hematologic/lymphatic: negative Musculoskeletal:negative Neurological: negative Behavioral/Psych: negative Endocrine: negative Allergic/Immunologic: negative   PHYSICAL EXAMINATION: General appearance: alert, cooperative, fatigued and no distress Head: Normocephalic, without obvious abnormality, atraumatic Neck: no adenopathy, no JVD, supple, symmetrical, trachea midline and thyroid not enlarged, symmetric, no tenderness/mass/nodules Lymph nodes: Cervical, supraclavicular, and axillary nodes normal. Resp: clear to auscultation bilaterally Back: symmetric, no curvature. ROM normal. No CVA tenderness. Cardio: regular rate and rhythm, S1, S2 normal, no murmur, click, rub or gallop GI: soft, non-tender; bowel sounds normal; no masses,  no organomegaly Extremities: extremities normal, atraumatic, no cyanosis or edema Neurologic: Alert and oriented X 3, normal strength and tone. Normal symmetric reflexes. Normal coordination and gait  ECOG PERFORMANCE STATUS: 1 - Symptomatic  but completely ambulatory  Blood pressure 102/67, pulse 105, temperature 98.2 F (36.8 C), temperature source Oral, resp. rate 18, height 5' 4" (1.626 m), weight 103 lb 14.4 oz (47.129 kg), last menstrual period 01/14/2008, SpO2 99 %.  LABORATORY DATA: Lab Results  Component Value Date   WBC 3.7* 12/22/2014   HGB 8.4* 12/22/2014   HCT 25.3* 12/22/2014   MCV 82.2 12/22/2014   PLT 209 12/22/2014      Chemistry      Component Value Date/Time   NA 137 12/22/2014 1043   NA 137 07/28/2014 0916   K 4.0 12/22/2014 1043   K 4.4 07/28/2014 0916   CL 104 07/28/2014  0916   CO2 25 12/22/2014 1043   CO2 28 07/28/2014 0916   BUN 9.6 12/22/2014 1043   BUN 13 07/28/2014 0916   CREATININE 0.6 12/22/2014 1043   CREATININE 0.85 07/28/2014 0916   CREATININE 0.83 09/07/2013 1506      Component Value Date/Time   CALCIUM 8.6 12/22/2014 1043   CALCIUM 8.8 07/28/2014 0916   ALKPHOS 96 12/22/2014 1043   ALKPHOS 49 07/28/2014 0916   AST 13 12/22/2014 1043   AST 21 07/28/2014 0916   ALT 11 12/22/2014 1043   ALT 12 07/28/2014 0916   BILITOT 0.99 12/22/2014 1043   BILITOT 0.9 07/28/2014 0916       RADIOGRAPHIC STUDIES: No results found.  ASSESSMENT AND PLAN: This is a very pleasant 64 years old white female with metastatic non-small cell lung cancer, adenocarcinoma status post several treatment regimen including carboplatin, Alimta and Avastin followed by treatment with Nivolumab for 16 cycles and most recently treated with oral Gilotrif but unfortunately has evidence for disease progression especially in the spleen and liver. The patient is currently undergoing systemic chemotherapy with single agent gemcitabine anterior to the treatment well except for the fatigue. I recommended for her to proceed with day 8 of cycle #2 as a scheduled. She would come back for follow-up visit in 2 weeks for reevaluation after repeating CT scan of the head, chest, abdomen and pelvis for restaging of her disease. For pain management the patient will continue on fentanyl patch 50 g/hour every 3 days. The patient was advised to call immediately if she has any concerning symptoms in the interval.  The patient voices understanding of current disease status and treatment options and is in agreement with the current care plan.  All questions were answered. The patient knows to call the clinic with any problems, questions or concerns. We can certainly see the patient much sooner if necessary.  Disclaimer: This note was dictated with voice recognition software. Similar sounding words  can inadvertently be transcribed and may not be corrected upon review.

## 2014-12-22 NOTE — Patient Instructions (Signed)

## 2014-12-22 NOTE — Telephone Encounter (Signed)
Gave adn printed appt sched for pt for Sept and OCT

## 2014-12-22 NOTE — Progress Notes (Signed)
Faxed wheelchair rx to [facility]

## 2014-12-22 NOTE — Patient Instructions (Signed)
[facility] Discharge Instructions for Patients Receiving Chemotherapy  Today you received the following chemotherapy agents Gemzar  To help prevent nausea and vomiting after your treatment, we encourage you to take your nausea medication as prescribed   If you develop nausea and vomiting that is not controlled by your nausea medication, call the clinic.   BELOW ARE SYMPTOMS THAT SHOULD BE REPORTED IMMEDIATELY:  *FEVER GREATER THAN 100.5 F  *CHILLS WITH OR WITHOUT FEVER  NAUSEA AND VOMITING THAT IS NOT CONTROLLED WITH YOUR NAUSEA MEDICATION  *UNUSUAL SHORTNESS OF BREATH  *UNUSUAL BRUISING OR BLEEDING  TENDERNESS IN MOUTH AND THROAT WITH OR WITHOUT PRESENCE OF ULCERS  *URINARY PROBLEMS  *BOWEL PROBLEMS  UNUSUAL RASH Items with * indicate a potential emergency and should be followed up as soon as possible.  Feel free to call the clinic you have any questions or concerns. The clinic phone number is [phone].  Please show the CHEMO ALERT CARD at check-in to the Emergency Department and triage nurse.   

## 2014-12-26 ENCOUNTER — Encounter (HOSPITAL_COMMUNITY): Payer: Self-pay

## 2014-12-26 ENCOUNTER — Inpatient Hospital Stay (HOSPITAL_COMMUNITY)
Admission: EM | Admit: 2014-12-26 | Discharge: 2015-01-03 | DRG: 871 | Disposition: A | Discharge status: Skilled Nursing Facility | Payer: BC Managed Care – PPO | Attending: Family Medicine | Admitting: Family Medicine

## 2014-12-26 ENCOUNTER — Emergency Department (HOSPITAL_COMMUNITY): Payer: BC Managed Care – PPO

## 2014-12-26 DIAGNOSIS — N179 Acute kidney failure, unspecified: Secondary | ICD-10-CM | POA: Diagnosis present

## 2014-12-26 DIAGNOSIS — C3490 Malignant neoplasm of unspecified part of unspecified bronchus or lung: Secondary | ICD-10-CM | POA: Diagnosis present

## 2014-12-26 DIAGNOSIS — Z91018 Allergy to other foods: Secondary | ICD-10-CM

## 2014-12-26 DIAGNOSIS — A419 Sepsis, unspecified organism: Secondary | ICD-10-CM

## 2014-12-26 DIAGNOSIS — E876 Hypokalemia: Secondary | ICD-10-CM | POA: Diagnosis present

## 2014-12-26 DIAGNOSIS — C787 Secondary malignant neoplasm of liver and intrahepatic bile duct: Secondary | ICD-10-CM | POA: Diagnosis present

## 2014-12-26 DIAGNOSIS — R739 Hyperglycemia, unspecified: Secondary | ICD-10-CM | POA: Diagnosis present

## 2014-12-26 DIAGNOSIS — Z87891 Personal history of nicotine dependence: Secondary | ICD-10-CM

## 2014-12-26 DIAGNOSIS — T451X5A Adverse effect of antineoplastic and immunosuppressive drugs, initial encounter: Secondary | ICD-10-CM | POA: Diagnosis present

## 2014-12-26 DIAGNOSIS — Z923 Personal history of irradiation: Secondary | ICD-10-CM

## 2014-12-26 DIAGNOSIS — R630 Anorexia: Secondary | ICD-10-CM

## 2014-12-26 DIAGNOSIS — Y95 Nosocomial condition: Secondary | ICD-10-CM | POA: Diagnosis present

## 2014-12-26 DIAGNOSIS — Z79899 Other long term (current) drug therapy: Secondary | ICD-10-CM

## 2014-12-26 DIAGNOSIS — R64 Cachexia: Secondary | ICD-10-CM | POA: Diagnosis present

## 2014-12-26 DIAGNOSIS — J189 Pneumonia, unspecified organism: Secondary | ICD-10-CM

## 2014-12-26 DIAGNOSIS — E872 Acidosis: Secondary | ICD-10-CM | POA: Diagnosis present

## 2014-12-26 DIAGNOSIS — Z888 Allergy status to other drugs, medicaments and biological substances status: Secondary | ICD-10-CM

## 2014-12-26 DIAGNOSIS — C7931 Secondary malignant neoplasm of brain: Secondary | ICD-10-CM | POA: Diagnosis present

## 2014-12-26 DIAGNOSIS — C7951 Secondary malignant neoplasm of bone: Secondary | ICD-10-CM | POA: Diagnosis present

## 2014-12-26 DIAGNOSIS — J9600 Acute respiratory failure, unspecified whether with hypoxia or hypercapnia: Secondary | ICD-10-CM

## 2014-12-26 DIAGNOSIS — Z7901 Long term (current) use of anticoagulants: Secondary | ICD-10-CM

## 2014-12-26 DIAGNOSIS — D61810 Antineoplastic chemotherapy induced pancytopenia: Secondary | ICD-10-CM | POA: Diagnosis present

## 2014-12-26 DIAGNOSIS — D630 Anemia in neoplastic disease: Secondary | ICD-10-CM | POA: Diagnosis present

## 2014-12-26 DIAGNOSIS — Z681 Body mass index (BMI) 19 or less, adult: Secondary | ICD-10-CM

## 2014-12-26 DIAGNOSIS — Z803 Family history of malignant neoplasm of breast: Secondary | ICD-10-CM

## 2014-12-26 DIAGNOSIS — J910 Malignant pleural effusion: Secondary | ICD-10-CM | POA: Diagnosis present

## 2014-12-26 DIAGNOSIS — Z885 Allergy status to narcotic agent status: Secondary | ICD-10-CM

## 2014-12-26 DIAGNOSIS — K1231 Oral mucositis (ulcerative) due to antineoplastic therapy: Secondary | ICD-10-CM

## 2014-12-26 DIAGNOSIS — E871 Hypo-osmolality and hyponatremia: Secondary | ICD-10-CM | POA: Diagnosis present

## 2014-12-26 DIAGNOSIS — K219 Gastro-esophageal reflux disease without esophagitis: Secondary | ICD-10-CM | POA: Diagnosis present

## 2014-12-26 DIAGNOSIS — Z808 Family history of malignant neoplasm of other organs or systems: Secondary | ICD-10-CM

## 2014-12-26 DIAGNOSIS — Z86711 Personal history of pulmonary embolism: Secondary | ICD-10-CM

## 2014-12-26 DIAGNOSIS — C3492 Malignant neoplasm of unspecified part of left bronchus or lung: Secondary | ICD-10-CM

## 2014-12-26 DIAGNOSIS — Z800 Family history of malignant neoplasm of digestive organs: Secondary | ICD-10-CM

## 2014-12-26 MED ORDER — IBUPROFEN 800 MG PO TABS
800.0000 mg | ORAL_TABLET | Freq: Once | ORAL | Status: AC
  Administered 2014-12-27: 800 mg via ORAL
  Filled 2014-12-26: qty 1

## 2014-12-26 MED ORDER — VANCOMYCIN HCL IN DEXTROSE 1-5 GM/200ML-% IV SOLN
1000.0000 mg | Freq: Once | INTRAVENOUS | Status: AC
  Administered 2014-12-27: 1000 mg via INTRAVENOUS

## 2014-12-26 MED ORDER — SODIUM CHLORIDE 0.9 % IV BOLUS (SEPSIS)
1000.0000 mL | Freq: Once | INTRAVENOUS | Status: AC
  Administered 2014-12-26: 1000 mL via INTRAVENOUS

## 2014-12-26 MED ORDER — SODIUM CHLORIDE 0.9 % IV BOLUS (SEPSIS)
500.0000 mL | INTRAVENOUS | Status: AC
  Administered 2014-12-27: 500 mL via INTRAVENOUS

## 2014-12-26 MED ORDER — PIPERACILLIN-TAZOBACTAM 3.375 G IVPB 30 MIN
3.3750 g | Freq: Once | INTRAVENOUS | Status: AC
  Administered 2014-12-27: 3.375 g via INTRAVENOUS
  Filled 2014-12-26: qty 50

## 2014-12-26 NOTE — ED Notes (Signed)
MD at bedside.Dr. Claudine Mouton evaluated pt.

## 2014-12-26 NOTE — ED Notes (Signed)
Pt here from home. Pt was watching TV with husband and became acutely agiitated and confused. Husband sts pt didn't even know her name. Pt confused at this time. Pt has hx of right lung cancer with mets to lover and bone- taking chemo and radiation at [facility]. FD noted hypoxia at 82%, EMS placed on NRB and o2 improved to 100%. Pt alert and agitated. HR 180.

## 2014-12-26 NOTE — ED Notes (Signed)
Code Sepsis called to [facility]

## 2014-12-26 NOTE — ED Provider Notes (Signed)
CSN: [id number]     Arrival date & time 12/26/14  2315 History   This chart was scribed for Everlene Balls, MD by Randa Evens, ED Scribe. This patient was seen in room A04C/A04C and the patient's care was started at 11:25 PM.    Chief Complaint  Patient presents with  . Altered Mental Status   The history is provided by the patient. No language interpreter was used.   HPI Comments: Level 5 Caveat: AMS Sherri Stafford is a 64 y.o. female who presents to the Emergency Department for AMS onset 2 hours PTA. Husband states that she ws dx with lung cancer with mets. Husband denies fever. Husband states that her last chemo therapy treatment was Friday. Pt reports intermittent diarrhea and decreased appetite. Also had a recent MRI brain that shows resolution of metastatic disease  Past Medical History  Diagnosis Date  . Arthritis   . Bilateral ovarian cysts   . GERD (gastroesophageal reflux disease)   . Strain of hip flexor 06/2013    left side torn  . Radiation     at [facility] to left hip, sacrum and brain  . Radiation 02/10/14-03/06/14    left central chest 35 gray  . Encounter for antineoplastic chemotherapy 09/19/2014  . Cancer     lung ca  . Bone metastases     to left hip and spine  . Radiation 10/12/14-10/26/14    pelvis region 20 gray   Past Surgical History  Procedure Laterality Date  . Dilation and curettage of uterus  2004    x 3   . Tubal ligation    . Colonoscopy  04/05/2004    normal    Family History  Problem Relation Age of Onset  . Brain cancer Father   . Emphysema Father     smoked  . Pancreatic cancer Mother   . Colon cancer Neg Hx   . Breast cancer Paternal Aunt 65   Social History  Substance Use Topics  . Smoking status: Former Smoker -- 1.00 packs/day for 14 years    Types: Cigarettes    Quit date: 04/15/1984  . Smokeless tobacco: Never Used  . Alcohol Use: No   OB History    Gravida Para Term Preterm AB TAB SAB Ectopic Multiple Living   0               Review of Systems  Unable to perform ROS: Mental status change     Allergies  Other; Ketoconazole; Lorazepam; Morphine and related; Nystatin; Sporanox; Tylenol; Vitamin d; and Zofran  Home Medications   Prior to Admission medications   Medication Sig Start Date End Date Taking? Authorizing Provider  albuterol (PROVENTIL HFA;VENTOLIN HFA) 108 (90 BASE) MCG/ACT inhaler Inhale 1-2 puffs into the lungs every 6 (six) hours as needed for wheezing or shortness of breath. 01/28/14   Susanne Borders, NP  bisacodyl (DULCOLAX) 10 MG suppository Place 1 suppository (10 mg total) rectally daily as needed for moderate constipation. Patient not taking: Reported on 12/22/2014 01/31/14   Curt Bears, MD  clindamycin (CLEOCIN-T) 1 % external solution Apply topically 2 (two) times daily. Patient not taking: Reported on 12/22/2014 09/15/14   Carlton Adam, PA-C  [facility]S SENNA PLUS 8.6-50 MG per tablet  02/18/14   Historical Provider, MD  dexamethasone (DECADRON) 4 MG tablet May take 1/2 to 1 tab PO BID PRN nausea. 11/14/14   Susanne Borders, NP  diphenoxylate-atropine (LOMOTIL) 2.5-0.025 MG per tablet Take 2 tablets by  mouth 4 (four) times daily as needed for diarrhea or loose stools. Patient not taking: Reported on 12/22/2014 09/26/14   Susanne Borders, NP  docusate sodium (COLACE) 100 MG capsule Take 100 mg by mouth 2 (two) times daily.    Historical Provider, MD  dronabinol (MARINOL) 2.5 MG capsule Take 1 capsule (2.5 mg total) by mouth 2 (two) times daily before a meal. 12/01/14   Owens Shark, NP  fentaNYL (DURAGESIC - DOSED MCG/HR) 50 MCG/HR Place 1 patch (50 mcg total) onto the skin every 3 (three) days. 12/15/14   Susanne Borders, NP  glycerin adult 2 G SUPP Place 1 suppository rectally once as needed for moderate constipation. 11/14/13   Serita Grit, MD  OLANZapine zydis (ZYPREXA ZYDIS) 10 MG disintegrating tablet For nausea Patient not taking: Reported on 12/22/2014 11/01/14   Curt Bears, MD  polyethylene  glycol [facility] / GLYCOLAX) packet Take 17 g by mouth daily as needed for moderate constipation. 11/14/13   Serita Grit, MD  prochlorperazine (COMPAZINE) 10 MG tablet Take 1 tablet (10 mg total) by mouth every 6 (six) hours as needed for nausea or vomiting. Patient not taking: Reported on 12/22/2014 10/14/14   Susanne Borders, NP  rivaroxaban (XARELTO) 20 MG TABS tablet Take 1 tablet (20 mg total) by mouth daily with supper. 06/08/14   Carlton Adam, PA-C  sucralfate (CARAFATE) 1 GM/10ML suspension Take 10 mLs (1 g total) by mouth 4 (four) times daily -  with meals and at bedtime. 09/27/14   Gery Pray, MD  traMADol (ULTRAM) 50 MG tablet TAKE 1 TABLET BY MOUTH EVERY 6 HOURS AS NEEDED 02/18/14   Adrena E Johnson, PA-C   BP 110/47 mmHg  Pulse 178  Temp(Src) 101.8 F (38.8 C) (Rectal)  Resp 25  Ht '5\' 4"'$  (1.626 m)  Wt 103 lb (46.72 kg)  BMI 17.67 kg/m2  SpO2 100%  LMP 01/14/2008   Physical Exam  Constitutional: She appears well-developed and well-nourished. No distress. Nasal cannula in place.  HENT:  Head: Normocephalic and atraumatic.  Nose: Nose normal.  Mouth/Throat: Oropharynx is clear and moist. No oropharyngeal exudate.  Eyes: Conjunctivae and EOM are normal. Pupils are equal, round, and reactive to light. No scleral icterus.  Neck: Normal range of motion. Neck supple. No JVD present. No tracheal deviation present. No thyromegaly present.  Cardiovascular: Regular rhythm and normal heart sounds.  Tachycardia present.  Exam reveals no gallop and no friction rub.   No murmur heard. Pulmonary/Chest: Effort normal and breath sounds normal. No respiratory distress. She has no wheezes. She exhibits no tenderness.  Abdominal: Soft. Bowel sounds are normal. She exhibits no distension and no mass. There is no tenderness. There is no rebound and no guarding.  Musculoskeletal: Normal range of motion. She exhibits no edema or tenderness.  Lymphadenopathy:    She has no cervical adenopathy.   Neurological: She is alert.  Patient confused but able to follow commands  Skin: Skin is warm and dry. No rash noted. No erythema. No pallor.  Nursing note and vitals reviewed.   ED Course  Procedures (including critical care time) DIAGNOSTIC STUDIES: Oxygen Saturation is 100% on [place], normal by my interpretation.    COORDINATION OF CARE:   Labs Review Labs Reviewed  COMPREHENSIVE METABOLIC PANEL - Abnormal; Notable for the following:    Sodium 134 (*)    Potassium 3.1 (*)    CO2 16 (*)    Glucose, Bld 175 (*)    Creatinine,  Ser 1.22 (*)    Calcium 7.9 (*)    Total Protein 5.0 (*)    Albumin 2.8 (*)    AST 44 (*)    Total Bilirubin 1.3 (*)    GFR calc non Af Amer 47 (*)    GFR calc Af Amer 54 (*)    All other components within normal limits  CBC WITH DIFFERENTIAL/PLATELET - Abnormal; Notable for the following:    RBC 2.94 (*)    Hemoglobin 8.1 (*)    HCT 24.7 (*)    RDW 20.5 (*)    All other components within normal limits  URINALYSIS, ROUTINE W REFLEX MICROSCOPIC (NOT AT [facility]) - Abnormal; Notable for the following:    Hgb urine dipstick MODERATE (*)    Protein, ur 100 (*)    All other components within normal limits  URINE MICROSCOPIC-ADD ON - Abnormal; Notable for the following:    Casts HYALINE CASTS (*)    All other components within normal limits  I-STAT CG4 LACTIC ACID, ED - Abnormal; Notable for the following:    Lactic Acid, Venous 7.00 (*)    All other components within normal limits  CULTURE, BLOOD (ROUTINE X 2)  CULTURE, BLOOD (ROUTINE X 2)  URINE CULTURE    Imaging Review Dg Chest Portable 1 View  12/27/2014   CLINICAL DATA:  64 year old female with sepsis  EXAM: PORTABLE CHEST - 1 VIEW  COMPARISON:  CT dated 11/09/2014  FINDINGS: Right pectoral Port-A-Cath with tip at the cavoatrial junction. There is a large area of opacity involving the left lower lung [place] compatible with combination of pleural effusion and associated compressive atelectasis/  pneumonia. The right lung is clear. There is no pneumothorax with the cardiac silhouette is within normal limits. The osseous structures are grossly unremarkable.  IMPRESSION: Left-sided pleural effusion with left lung base atelectasis/pneumonia. Clinical correlation and follow-up recommended.   Electronically Signed   By: Anner Crete M.D.   On: 12/27/2014 00:21   I have personally reviewed and evaluated these images and lab results as part of my medical decision-making.   EKG Interpretation   Date/Time:  Monday December 26 2014 23:21:02 EDT Ventricular Rate:  176 PR Interval:  64 QRS Duration: 86 QT Interval:  241 QTC Calculation: 412 R Axis:   57 Text Interpretation:  Sinus tachycardia Multiform ventricular premature  complexes RSR' in V1 or V2, probably normal variant ST depression,  probably rate related tachycardia now worsened Confirmed by Glynn Octave [phone]) on 12/26/2014 11:51:42 PM      MDM   CRITICAL CARE Performed by: Everlene Balls, MD Total critical care time: 56mnutes Critical care time was exclusive of separately billable procedures and treating other patients. Critical care was necessary to treat or prevent imminent or life-threatening deterioration. Critical care was time spent personally by me on the following activities: development of treatment plan with patient and/or surrogate as well as nursing, discussions with consultants, evaluation of patient's response to treatment, examination of patient, obtaining history from patient or surrogate, ordering and performing treatments and interventions, ordering and review of laboratory studies, ordering and review of radiographic studies, pulse oximetry and re-evaluation of patient's condition.  Final diagnoses:  None    Patient presents to the ED for confusion and fever.  Initial eval reveals HR of 160, BP, 90/60, concerning for sepsis.  Code sepsis was immediately called.  Her port was accessed and  patient was given 2L IVF, vanc and zosyn.  She continues to be  hypotensive to 80 systolic.  Norepinephrine gtt was started peripherally.  I paged critical care who will admit the patient for further management.  Lactate is 7, CXR reveals LLL PNA, likely her source.  She was given motrin for fever.  EKG revels sinus tachycardia.  HR now down to 120.  She continues to be hypotensive after 2L IVF.  2 more L ordered.  Also will start norepi gtt peripherally.  Critical Care PA on call is aware of initiation of vasopressors.   I personally performed the services described in this documentation, which was scribed in my presence. The recorded information has been reviewed and is accurate.      Everlene Balls, MD 12/27/14 1734

## 2014-12-27 DIAGNOSIS — T451X5A Adverse effect of antineoplastic and immunosuppressive drugs, initial encounter: Secondary | ICD-10-CM | POA: Diagnosis present

## 2014-12-27 DIAGNOSIS — R410 Disorientation, unspecified: Secondary | ICD-10-CM | POA: Diagnosis not present

## 2014-12-27 DIAGNOSIS — N179 Acute kidney failure, unspecified: Secondary | ICD-10-CM | POA: Diagnosis present

## 2014-12-27 DIAGNOSIS — R64 Cachexia: Secondary | ICD-10-CM | POA: Diagnosis present

## 2014-12-27 DIAGNOSIS — A021 Salmonella sepsis: Secondary | ICD-10-CM | POA: Diagnosis not present

## 2014-12-27 DIAGNOSIS — E871 Hypo-osmolality and hyponatremia: Secondary | ICD-10-CM | POA: Diagnosis present

## 2014-12-27 DIAGNOSIS — J910 Malignant pleural effusion: Secondary | ICD-10-CM | POA: Diagnosis present

## 2014-12-27 DIAGNOSIS — R6521 Severe sepsis with septic shock: Secondary | ICD-10-CM

## 2014-12-27 DIAGNOSIS — A4151 Sepsis due to Escherichia coli [E. coli]: Secondary | ICD-10-CM | POA: Diagnosis not present

## 2014-12-27 DIAGNOSIS — C3490 Malignant neoplasm of unspecified part of unspecified bronchus or lung: Secondary | ICD-10-CM

## 2014-12-27 DIAGNOSIS — C787 Secondary malignant neoplasm of liver and intrahepatic bile duct: Secondary | ICD-10-CM | POA: Diagnosis present

## 2014-12-27 DIAGNOSIS — Z808 Family history of malignant neoplasm of other organs or systems: Secondary | ICD-10-CM | POA: Diagnosis not present

## 2014-12-27 DIAGNOSIS — Z7901 Long term (current) use of anticoagulants: Secondary | ICD-10-CM | POA: Diagnosis not present

## 2014-12-27 DIAGNOSIS — J189 Pneumonia, unspecified organism: Secondary | ICD-10-CM | POA: Diagnosis present

## 2014-12-27 DIAGNOSIS — C7931 Secondary malignant neoplasm of brain: Secondary | ICD-10-CM | POA: Diagnosis present

## 2014-12-27 DIAGNOSIS — Z885 Allergy status to narcotic agent status: Secondary | ICD-10-CM | POA: Diagnosis not present

## 2014-12-27 DIAGNOSIS — Z803 Family history of malignant neoplasm of breast: Secondary | ICD-10-CM | POA: Diagnosis not present

## 2014-12-27 DIAGNOSIS — Z923 Personal history of irradiation: Secondary | ICD-10-CM | POA: Diagnosis not present

## 2014-12-27 DIAGNOSIS — J90 Pleural effusion, not elsewhere classified: Secondary | ICD-10-CM | POA: Diagnosis not present

## 2014-12-27 DIAGNOSIS — C3432 Malignant neoplasm of lower lobe, left bronchus or lung: Secondary | ICD-10-CM | POA: Diagnosis not present

## 2014-12-27 DIAGNOSIS — Z888 Allergy status to other drugs, medicaments and biological substances status: Secondary | ICD-10-CM | POA: Diagnosis not present

## 2014-12-27 DIAGNOSIS — K219 Gastro-esophageal reflux disease without esophagitis: Secondary | ICD-10-CM | POA: Diagnosis present

## 2014-12-27 DIAGNOSIS — Z681 Body mass index (BMI) 19 or less, adult: Secondary | ICD-10-CM | POA: Diagnosis not present

## 2014-12-27 DIAGNOSIS — Z87891 Personal history of nicotine dependence: Secondary | ICD-10-CM | POA: Diagnosis not present

## 2014-12-27 DIAGNOSIS — Z79899 Other long term (current) drug therapy: Secondary | ICD-10-CM | POA: Diagnosis not present

## 2014-12-27 DIAGNOSIS — Z86711 Personal history of pulmonary embolism: Secondary | ICD-10-CM | POA: Diagnosis not present

## 2014-12-27 DIAGNOSIS — R739 Hyperglycemia, unspecified: Secondary | ICD-10-CM | POA: Diagnosis present

## 2014-12-27 DIAGNOSIS — A408 Other streptococcal sepsis: Secondary | ICD-10-CM | POA: Diagnosis not present

## 2014-12-27 DIAGNOSIS — A419 Sepsis, unspecified organism: Secondary | ICD-10-CM | POA: Diagnosis present

## 2014-12-27 DIAGNOSIS — C799 Secondary malignant neoplasm of unspecified site: Secondary | ICD-10-CM | POA: Diagnosis not present

## 2014-12-27 DIAGNOSIS — E872 Acidosis: Secondary | ICD-10-CM | POA: Diagnosis present

## 2014-12-27 DIAGNOSIS — E876 Hypokalemia: Secondary | ICD-10-CM | POA: Diagnosis present

## 2014-12-27 DIAGNOSIS — C7951 Secondary malignant neoplasm of bone: Secondary | ICD-10-CM | POA: Diagnosis present

## 2014-12-27 DIAGNOSIS — Z800 Family history of malignant neoplasm of digestive organs: Secondary | ICD-10-CM | POA: Diagnosis not present

## 2014-12-27 DIAGNOSIS — D630 Anemia in neoplastic disease: Secondary | ICD-10-CM | POA: Diagnosis present

## 2014-12-27 DIAGNOSIS — D61810 Antineoplastic chemotherapy induced pancytopenia: Secondary | ICD-10-CM | POA: Diagnosis present

## 2014-12-27 DIAGNOSIS — E860 Dehydration: Secondary | ICD-10-CM | POA: Diagnosis not present

## 2014-12-27 DIAGNOSIS — Y95 Nosocomial condition: Secondary | ICD-10-CM | POA: Diagnosis present

## 2014-12-27 DIAGNOSIS — Z91018 Allergy to other foods: Secondary | ICD-10-CM | POA: Diagnosis not present

## 2014-12-27 LAB — CBC
HEMATOCRIT: 23.7 % — AB (ref 36.0–46.0)
Hemoglobin: 7.7 g/dL — ABNORMAL LOW (ref 12.0–15.0)
MCH: 27 pg (ref 26.0–34.0)
MCHC: 32.5 g/dL (ref 30.0–36.0)
MCV: 83.2 fL (ref 78.0–100.0)
PLATELETS: 100 10*3/uL — AB (ref 150–400)
RBC: 2.85 MIL/uL — ABNORMAL LOW (ref 3.87–5.11)
RDW: 20.5 % — AB (ref 11.5–15.5)
WBC: 4.9 10*3/uL (ref 4.0–10.5)

## 2014-12-27 LAB — MAGNESIUM: MAGNESIUM: 1.7 mg/dL (ref 1.7–2.4)

## 2014-12-27 LAB — I-STAT CG4 LACTIC ACID, ED: Lactic Acid, Venous: 7 mmol/L (ref 0.5–2.0)

## 2014-12-27 LAB — CBC WITH DIFFERENTIAL/PLATELET
BASOS ABS: 0 10*3/uL (ref 0.0–0.1)
Basophils Relative: 0 % (ref 0–1)
EOS ABS: 0.1 10*3/uL (ref 0.0–0.7)
Eosinophils Relative: 2 % (ref 0–5)
HEMATOCRIT: 24.7 % — AB (ref 36.0–46.0)
HEMOGLOBIN: 8.1 g/dL — AB (ref 12.0–15.0)
LYMPHS PCT: 4 % — AB (ref 12–46)
Lymphs Abs: 0.1 10*3/uL — ABNORMAL LOW (ref 0.7–4.0)
MCH: 27.6 pg (ref 26.0–34.0)
MCHC: 32.8 g/dL (ref 30.0–36.0)
MCV: 84 fL (ref 78.0–100.0)
MONO ABS: 0.1 10*3/uL (ref 0.1–1.0)
Monocytes Relative: 2 % — ABNORMAL LOW (ref 3–12)
NEUTROS PCT: 92 % — AB (ref 43–77)
Neutro Abs: 2.7 10*3/uL (ref 1.7–7.7)
Platelets: 105 10*3/uL — ABNORMAL LOW (ref 150–400)
RBC: 2.94 MIL/uL — AB (ref 3.87–5.11)
RDW: 20.5 % — ABNORMAL HIGH (ref 11.5–15.5)
WBC: 3 10*3/uL — AB (ref 4.0–10.5)

## 2014-12-27 LAB — COMPREHENSIVE METABOLIC PANEL
ALK PHOS: 107 U/L (ref 38–126)
ALT: 20 U/L (ref 14–54)
ANION GAP: 13 (ref 5–15)
AST: 44 U/L — ABNORMAL HIGH (ref 15–41)
Albumin: 2.8 g/dL — ABNORMAL LOW (ref 3.5–5.0)
BUN: 14 mg/dL (ref 6–20)
CALCIUM: 7.9 mg/dL — AB (ref 8.9–10.3)
CHLORIDE: 105 mmol/L (ref 101–111)
CO2: 16 mmol/L — AB (ref 22–32)
CREATININE: 1.22 mg/dL — AB (ref 0.44–1.00)
GFR, EST AFRICAN AMERICAN: 54 mL/min — AB (ref >60)
GFR, EST NON AFRICAN AMERICAN: 47 mL/min — AB (ref >60)
Glucose, Bld: 175 mg/dL — ABNORMAL HIGH (ref 65–99)
Potassium: 3.1 mmol/L — ABNORMAL LOW (ref 3.5–5.1)
SODIUM: 134 mmol/L — AB (ref 135–145)
Total Bilirubin: 1.3 mg/dL — ABNORMAL HIGH (ref 0.3–1.2)
Total Protein: 5 g/dL — ABNORMAL LOW (ref 6.5–8.1)

## 2014-12-27 LAB — URINE MICROSCOPIC-ADD ON

## 2014-12-27 LAB — BASIC METABOLIC PANEL
ANION GAP: 7 (ref 5–15)
BUN: 15 mg/dL (ref 6–20)
CALCIUM: 7.4 mg/dL — AB (ref 8.9–10.3)
CO2: 21 mmol/L — ABNORMAL LOW (ref 22–32)
Chloride: 105 mmol/L (ref 101–111)
Creatinine, Ser: 1.2 mg/dL — ABNORMAL HIGH (ref 0.44–1.00)
GFR, EST AFRICAN AMERICAN: 55 mL/min — AB (ref >60)
GFR, EST NON AFRICAN AMERICAN: 48 mL/min — AB (ref >60)
Glucose, Bld: 148 mg/dL — ABNORMAL HIGH (ref 65–99)
Potassium: 2.8 mmol/L — ABNORMAL LOW (ref 3.5–5.1)
SODIUM: 133 mmol/L — AB (ref 135–145)

## 2014-12-27 LAB — URINALYSIS, ROUTINE W REFLEX MICROSCOPIC
Bilirubin Urine: NEGATIVE
GLUCOSE, UA: NEGATIVE mg/dL
KETONES UR: NEGATIVE mg/dL
Leukocytes, UA: NEGATIVE
Nitrite: NEGATIVE
PROTEIN: 100 mg/dL — AB
Specific Gravity, Urine: 1.009 (ref 1.005–1.030)
Urobilinogen, UA: 1 mg/dL (ref 0.0–1.0)
pH: 7 (ref 5.0–8.0)

## 2014-12-27 LAB — HEPARIN LEVEL (UNFRACTIONATED): HEPARIN UNFRACTIONATED: 1.02 [IU]/mL — AB (ref 0.30–0.70)

## 2014-12-27 LAB — MRSA PCR SCREENING: MRSA BY PCR: NEGATIVE

## 2014-12-27 LAB — PHOSPHORUS

## 2014-12-27 LAB — GLUCOSE, CAPILLARY: GLUCOSE-CAPILLARY: 142 mg/dL — AB (ref 65–99)

## 2014-12-27 LAB — LACTIC ACID, PLASMA
LACTIC ACID, VENOUS: 2.2 mmol/L — AB (ref 0.5–2.0)
Lactic Acid, Venous: 1.5 mmol/L (ref 0.5–2.0)

## 2014-12-27 LAB — TROPONIN I: Troponin I: 0.09 ng/mL — ABNORMAL HIGH (ref <0.031)

## 2014-12-27 LAB — CORTISOL: Cortisol, Plasma: 47.8 ug/dL

## 2014-12-27 LAB — APTT: APTT: 41 s — AB (ref 24–37)

## 2014-12-27 MED ORDER — PIPERACILLIN-TAZOBACTAM 3.375 G IVPB
3.3750 g | Freq: Three times a day (TID) | INTRAVENOUS | Status: DC
  Administered 2014-12-27 – 2014-12-31 (×13): 3.375 g via INTRAVENOUS
  Filled 2014-12-27 (×17): qty 50

## 2014-12-27 MED ORDER — POTASSIUM PHOSPHATES 15 MMOLE/5ML IV SOLN
30.0000 mmol | Freq: Once | INTRAVENOUS | Status: AC
  Administered 2014-12-27: 30 mmol via INTRAVENOUS
  Filled 2014-12-27: qty 10

## 2014-12-27 MED ORDER — POTASSIUM CHLORIDE 10 MEQ/100ML IV SOLN
10.0000 meq | INTRAVENOUS | Status: DC
  Administered 2014-12-27: 10 meq via INTRAVENOUS
  Filled 2014-12-27: qty 100

## 2014-12-27 MED ORDER — FENTANYL CITRATE (PF) 100 MCG/2ML IJ SOLN
25.0000 ug | INTRAMUSCULAR | Status: DC | PRN
Start: 2014-12-27 — End: 2014-12-30

## 2014-12-27 MED ORDER — MAGNESIUM SULFATE 2 GM/50ML IV SOLN
2.0000 g | Freq: Once | INTRAVENOUS | Status: AC
  Administered 2014-12-27: 2 g via INTRAVENOUS
  Filled 2014-12-27: qty 50

## 2014-12-27 MED ORDER — SODIUM CHLORIDE 0.9 % IV BOLUS (SEPSIS)
2000.0000 mL | Freq: Once | INTRAVENOUS | Status: AC
  Administered 2014-12-27: 2000 mL via INTRAVENOUS

## 2014-12-27 MED ORDER — PIPERACILLIN-TAZOBACTAM 3.375 G IVPB
3.3750 g | Freq: Three times a day (TID) | INTRAVENOUS | Status: DC
  Filled 2014-12-27 (×2): qty 50

## 2014-12-27 MED ORDER — POTASSIUM CHLORIDE CRYS ER 20 MEQ PO TBCR: 40.0000 meq | EXTENDED_RELEASE_TABLET | Freq: Once | ORAL | Status: DC

## 2014-12-27 MED ORDER — FAMOTIDINE IN NACL 20-0.9 MG/50ML-% IV SOLN
20.0000 mg | INTRAVENOUS | Status: DC
  Administered 2014-12-27 – 2014-12-30 (×4): 20 mg via INTRAVENOUS
  Filled 2014-12-27 (×4): qty 50

## 2014-12-27 MED ORDER — DEXTROSE 5 % IV SOLN
2.0000 ug/min | INTRAVENOUS | Status: DC
  Filled 2014-12-27: qty 4

## 2014-12-27 MED ORDER — CETYLPYRIDINIUM CHLORIDE 0.05 % MT LIQD
7.0000 mL | Freq: Two times a day (BID) | OROMUCOSAL | Status: DC
  Administered 2014-12-27 – 2014-12-29 (×5): 7 mL via OROMUCOSAL

## 2014-12-27 MED ORDER — SODIUM CHLORIDE 0.9 % IV SOLN
INTRAVENOUS | Status: DC
  Administered 2014-12-28: 01:00:00 via INTRAVENOUS
  Administered 2014-12-29: 100 mL/h via INTRAVENOUS

## 2014-12-27 MED ORDER — VANCOMYCIN HCL IN DEXTROSE 750-5 MG/150ML-% IV SOLN
750.0000 mg | INTRAVENOUS | Status: DC
  Administered 2014-12-28 – 2014-12-30 (×4): 750 mg via INTRAVENOUS
  Filled 2014-12-27 (×5): qty 150

## 2014-12-27 MED ORDER — NOREPINEPHRINE BITARTRATE 1 MG/ML IV SOLN
2.0000 ug/min | INTRAVENOUS | Status: DC
  Administered 2014-12-27 (×2): 10 ug/min via INTRAVENOUS
  Administered 2014-12-27: 20 ug/min via INTRAVENOUS
  Administered 2014-12-27: 14 ug/min via INTRAVENOUS
  Administered 2014-12-28: 8 ug/min via INTRAVENOUS
  Filled 2014-12-27 (×5): qty 4

## 2014-12-27 MED ORDER — LEVALBUTEROL HCL 0.63 MG/3ML IN NEBU
0.6300 mg | INHALATION_SOLUTION | RESPIRATORY_TRACT | Status: DC | PRN
  Filled 2014-12-27: qty 3

## 2014-12-27 MED ORDER — SODIUM CHLORIDE 0.9 % IV SOLN
250.0000 mL | INTRAVENOUS | Status: DC | PRN
  Administered 2014-12-28: 250 mL via INTRAVENOUS

## 2014-12-27 MED ORDER — NOREPINEPHRINE BITARTRATE 1 MG/ML IV SOLN: 0.0000 ug/min | Freq: Once | INTRAVENOUS | Status: DC

## 2014-12-27 MED ORDER — PROMETHAZINE HCL 25 MG/ML IJ SOLN
12.5000 mg | Freq: Four times a day (QID) | INTRAMUSCULAR | Status: DC | PRN
  Administered 2014-12-27 – 2015-01-03 (×9): 12.5 mg via INTRAVENOUS
  Filled 2014-12-27 (×9): qty 1

## 2014-12-27 MED ORDER — HEPARIN (PORCINE) IN NACL 100-0.45 UNIT/ML-% IJ SOLN
1450.0000 [IU]/h | INTRAMUSCULAR | Status: DC
  Administered 2014-12-27: 700 [IU]/h via INTRAVENOUS
  Administered 2014-12-29: 650 [IU]/h via INTRAVENOUS
  Administered 2014-12-30: 1250 [IU]/h via INTRAVENOUS
  Filled 2014-12-27 (×6): qty 250

## 2014-12-27 NOTE — ED Notes (Signed)
Pt levo stopped per order and pressure dropped to 75 systolic, spoke with dr Nelda Marseille in [place] and levophed restarted.

## 2014-12-27 NOTE — Progress Notes (Signed)
CRITICAL VALUE ALERT  Critical value received:  Phos-<1.0  Date of notification:  12/27/2014  Time of notification:  3:57 AM  Critical value read back:Yes.    Nurse who received alert:  Dillard Essex  MD notified (1st page):  Dr. Nelda Marseille  Time of first page:  3:57 AM  MD notified (2nd page):  Time of second page:  Responding MD:  Dr. Nelda Marseille  Time MD responded:  3:57 AM

## 2014-12-27 NOTE — Progress Notes (Signed)
ANTICOAGULATION/ANTIBIOTIC CONSULT NOTE - Initial Consult  Pharmacy Consult for Vancomycin and Zosyn and Heparin Indication: sepsis (likely source PNA) and h/o PE  Allergies  Allergen Reactions  . Other     Opiates cause severe constipation. (to the point of being hospitalized)  . Ketoconazole Hives  . Lorazepam Hives    Broke out in hives at [facility]   . Morphine And Related Other (See Comments)    "Constipation to point of being hospitalized"  . Nystatin Hives  . Sporanox [Itraconazole] Hives  . Tylenol [Acetaminophen] Other (See Comments)    Rosanna Randy Syndrome   . Vitamin D Other (See Comments)    Gilbert's syndrome, elevated liver enzymes  . Zofran [Ondansetron Hcl] Hives    Hives at [facility]     Patient Measurements: Height: '5\' 4"'$  (162.6 cm) Weight: 103 lb (46.72 kg) IBW/kg (Calculated) : 54.7 Heparin Dosing Weight: 46 kg  Vital Signs: Temp: 102.1 F (38.9 C) (09/13 0051) Temp Source: Rectal (09/13 0051) BP: 83/44 mmHg (09/13 0115) Pulse Rate: 112 (09/13 0115)  Labs:  Recent Labs  12/26/14 2354  HGB 8.1*  HCT 24.7*  PLT 105*  CREATININE 1.22*    Estimated Creatinine Clearance: 35.7 mL/min (by C-G formula based on Cr of 1.22). No results for input(s): VANCOTROUGH, VANCOPEAK, VANCORANDOM, GENTTROUGH, GENTPEAK, GENTRANDOM, TOBRATROUGH, TOBRAPEAK, TOBRARND, AMIKACINPEAK, AMIKACINTROU, AMIKACIN in the last 72 hours.   Microbiology: Recent Results (from the past 720 hour(s))  TECHNOLOGIST REVIEW     Status: None   Collection Time: 12/22/14 10:43 AM  Result Value Ref Range Status   Technologist Review   Final    Rare Metas and Myelocytes present, rare nRBC, moderate ovalocytes, few teardrops    Medical History: Past Medical History  Diagnosis Date  . Arthritis   . Bilateral ovarian cysts   . GERD (gastroesophageal reflux disease)   . Strain of hip flexor 06/2013    left side torn  . Radiation     at [facility] to left hip, sacrum and brain  . Radiation  02/10/14-03/06/14    left central chest 35 gray  . Encounter for antineoplastic chemotherapy 09/19/2014  . Cancer     lung ca  . Bone metastases     to left hip and spine  . Radiation 10/12/14-10/26/14    pelvis region 20 gray    Medications:  See electronic med rec  Assessment: 64 y.o. female presents with AMS and fever. Code sepsis called - likely source PNA.  Anticoagulation: Xarelto PTA for h/o PE. Dose '20mg'$  daily - last taken 9/12 with supper. Holding Xarelto for now and to start heparin gtt per pharmacy. Hgb 8.1 (low but stable), plt 105 on admit (usually ~200) - ?due to chemo treatment. Will likely need to use aPTT to monitor heparin as Xarelto may still be affecting heparin level.  Infectious Disease: Vanc/Zosyn for sepsis (likely source PNA). WBC 3. LA 7. Tm 102.1.  Nephrology: SCr 1.22, est CrCl 35 ml/min.  Goal of Therapy:  Vancomycin trough level 15-20 mcg/ml  Heparin level 0.3-0.7, aPTT 66-102 sec Will monitor platelets according to anticoagulation protocol  Plan:  Zosyn 3.375gm IV q8h - dose over 4 hours Vancomycin '750mg'$  IV q24h Will f/u micro data, renal function, and pt's clinical condition Vanc trough prn Baseline heparin level and aPTT (pt on Xarelto PTA) Will start heparin 700 units/hr with no bolus at 1800 today F/u 6 hr heparin level, aPTT Daily heparin level, aPTT, and CBC while pt on heparin  Sherlon Handing, PharmD,  BCPS Clinical pharmacist, pager [phone] 12/27/2014,1:42 AM

## 2014-12-27 NOTE — ED Notes (Signed)
MD at bedside. 

## 2014-12-27 NOTE — Progress Notes (Signed)
CRITICAL VALUE ALERT  Critical value received:  Lactic acid-2.2  Date of notification:  12/27/2014  Time of notification:  3:36 AM  Critical value read back:Yes.    Nurse who received alert:  Dillard Essex  MD notified (1st page):  Dr. Nelda Marseille  Time of first page:  3:37 AM  MD notified (2nd page):  Time of second page:  Responding MD:  Dr. Nelda Marseille  Time MD responded:  3:37 AM

## 2014-12-27 NOTE — ED Notes (Signed)
Placed on  bedpan

## 2014-12-27 NOTE — ED Notes (Signed)
MD at bedside.Fuller Song with PCCM

## 2014-12-27 NOTE — Progress Notes (Signed)
PULMONARY / CRITICAL CARE MEDICINE   Name: Sherri Stafford MRN: [id number] DOB: 09-15-1950    ADMISSION DATE:  12/26/2014 CONSULTATION DATE:  12/27/2014  REFERRING MD :  EDP  CHIEF COMPLAINT: Confusion  INITIAL PRESENTATION:  64 y.o. F with metastatic NSCLC brought to [facility] ED 9/13 with confusion.  Found to be hypotensive with lactate of 7 and temp of 102.  PCCM called for admission of sepsis of unclear etiology; favor HCAP.    STUDIES:  MRI 09/09/14 >>> decreased size of the left cerebellar lesion with no new lesions. MRI L hip 09/22/14 >>> multiple metastatic bone lesions involving the pelvis. CT Chest / A / P 11/09/14 >>> stable pulmonary mets, stable left lower lobe mass within atelectatic lung, stable left effusion, decrease in size of left supraclavicular lymph node.  Dramatic increase in volume of splenic mets with enlargement of spleen and multiple new lesions.  Multiple new hepatic mets primarily in right hepatic lobe.  Mild increase in periportal adenopathy and stable retroperitoneal adenopathy.  Stable skeletal mets, no evidence of fx.  SIGNIFICANT EVENTS: 9/13 - admit.   HISTORY OF PRESENT ILLNESS:  Sherri Stafford is a 64 y.o. 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  MRI in May 2016 showed decreased size of the left cerebellar lesion with no new lesions.  She is currently undergoing chemotherapy under the care of Dr. Julien Nordmann.  On evening of 9/12 while watching TV, she began to act "strange" per her husband.  He states she wanted to use the restroom but went and stood besides the fire place.  He also states she was not making much sense in what she was saying.  She was in her USOH prior to this with the exception of decreased PO intake x 2 - 3 weeks.  She did have chills starting after her chemo treatment on 9/8 and pt and husband report that  these have continued up through this evening.  She has not had any fevers/sweats, chest pain, SOB, abdominal pain, diarrhea, myalgias.  She has had N / V but this is common due to her chemotherapy.  In ED, she was found to be hypotensive with a lactate of 7.  CXR revealed a left sided pleural effusion with left lung base infiltrate suspicious for ATX vs PNA.  Of note, CT scan from 11/09/14 showed stable LLL mass within the lower left atelectatic lung.   PAST MEDICAL HISTORY :   has a past medical history of Arthritis; Bilateral ovarian cysts; GERD (gastroesophageal reflux disease); Strain of hip flexor (06/2013); Radiation; Radiation (02/10/14-03/06/14); Encounter for antineoplastic chemotherapy (09/19/2014); Cancer; Bone metastases; and Radiation (10/12/14-10/26/14).  has past surgical history that includes Dilation and curettage of uterus (2004); Tubal ligation; and Colonoscopy (04/05/2004). Prior to Admission medications   Medication Sig Start Date End Date Taking? Authorizing Provider  dexamethasone (DECADRON) 4 MG tablet May take 1/2 to 1 tab PO BID PRN nausea. 11/14/14  Yes Susanne Borders, NP  dronabinol (MARINOL) 2.5 MG capsule Take 1 capsule (2.5 mg total) by mouth 2 (two) times daily before a meal. 12/01/14  Yes Owens Shark, NP  fentaNYL (DURAGESIC - DOSED MCG/HR) 50 MCG/HR Place 1 patch (50 mcg total) onto the skin every 3 (three) days. 12/15/14  Yes Susanne Borders, NP  OLANZapine zydis (ZYPREXA ZYDIS) 10 MG disintegrating  tablet For nausea Patient taking differently: Take 10 mg by mouth at bedtime as needed (nasea).  11/01/14  Yes Curt Bears, MD  rivaroxaban (XARELTO) 20 MG TABS tablet Take 1 tablet (20 mg total) by mouth daily with supper. 06/08/14  Yes Adrena E Johnson, PA-C  albuterol (PROVENTIL HFA;VENTOLIN HFA) 108 (90 BASE) MCG/ACT inhaler Inhale 1-2 puffs into the lungs every 6 (six) hours as needed for wheezing or shortness of breath. Patient not taking: Reported on 12/27/2014 01/28/14    Susanne Borders, NP  bisacodyl (DULCOLAX) 10 MG suppository Place 1 suppository (10 mg total) rectally daily as needed for moderate constipation. Patient not taking: Reported on 12/22/2014 01/31/14   Curt Bears, MD  clindamycin (CLEOCIN-T) 1 % external solution Apply topically 2 (two) times daily. Patient not taking: Reported on 12/22/2014 09/15/14   Carlton Adam, PA-C  diphenoxylate-atropine (LOMOTIL) 2.5-0.025 MG per tablet Take 2 tablets by mouth 4 (four) times daily as needed for diarrhea or loose stools. Patient not taking: Reported on 12/22/2014 09/26/14   Susanne Borders, NP  prochlorperazine (COMPAZINE) 10 MG tablet Take 1 tablet (10 mg total) by mouth every 6 (six) hours as needed for nausea or vomiting. Patient not taking: Reported on 12/22/2014 10/14/14   Susanne Borders, NP  sucralfate (CARAFATE) 1 GM/10ML suspension Take 10 mLs (1 g total) by mouth 4 (four) times daily -  with meals and at bedtime. Patient not taking: Reported on 12/27/2014 09/27/14   Gery Pray, MD  traMADol (ULTRAM) 50 MG tablet TAKE 1 TABLET BY MOUTH EVERY 6 HOURS AS NEEDED Patient not taking: Reported on 12/27/2014 02/18/14   Carlton Adam, PA-C   Allergies  Allergen Reactions  . Other     Opiates cause severe constipation. (to the point of being hospitalized)  . Ketoconazole Hives  . Lorazepam Hives    Broke out in hives at [facility]   . Morphine And Related Other (See Comments)    "Constipation to point of being hospitalized"  . Nystatin Hives  . Sporanox [Itraconazole] Hives  . Tylenol [Acetaminophen] Other (See Comments)    Rosanna Randy Syndrome   . Vitamin D Other (See Comments)    Gilbert's syndrome, elevated liver enzymes  . Zofran [Ondansetron Hcl] Hives    Hives at [facility]     FAMILY HISTORY:  Family History  Problem Relation Age of Onset  . Brain cancer Father   . Emphysema Father     smoked  . Pancreatic cancer Mother   . Colon cancer Neg Hx   . Breast cancer Paternal Aunt 55    SOCIAL  HISTORY:  reports that she quit smoking about 30 years ago. Her smoking use included Cigarettes. She has a 14 pack-year smoking history. She has never used smokeless tobacco. She reports that she does not drink alcohol or use illicit drugs.  REVIEW OF SYSTEMS:   All negative; except for those that are bolded, which indicate positives.  Constitutional: weight loss, weight gain, night sweats, fevers, chills, fatigue, weakness.  HEENT: headaches, sore throat, sneezing, nasal congestion, post nasal drip, difficulty swallowing, tooth/dental problems, visual complaints, visual changes, ear aches. Neuro: difficulty with speech, weakness, numbness, ataxia. CV:  chest pain, orthopnea, PND, swelling in lower extremities, dizziness, palpitations, syncope.  Resp: cough, hemoptysis, dyspnea, wheezing. GI  heartburn, indigestion, abdominal pain, nausea, vomiting, diarrhea, constipation, change in bowel habits, loss of appetite, hematemesis, melena, hematochezia.  GU: dysuria, change in color of urine, urgency or frequency, flank pain, hematuria. MSK:  joint pain or swelling, decreased range of motion. Psych: change in mood or affect, depression, anxiety, suicidal ideations, homicidal ideations. Skin: rash, itching, bruising.  SUBJECTIVE:  No major issues. Continues to be on norepi at 10. Lactate has normalized.  VITAL SIGNS: Temp:  [97.5 F (36.4 C)-102.1 F (38.9 C)] 97.5 F (36.4 C) (09/13 1159) Pulse Rate:  [80-178] 84 (09/13 1200) Resp:  [15-30] 17 (09/13 1200) BP: (62-130)/(32-71) 82/55 mmHg (09/13 1200) SpO2:  [96 %-100 %] 100 % (09/13 1200) Weight:  [103 lb (46.72 kg)-104 lb 0.9 oz (47.2 kg)] 104 lb 0.9 oz (47.2 kg) (09/13 0222) HEMODYNAMICS:   VENTILATOR SETTINGS:   INTAKE / OUTPUT: Intake/Output      09/12 0701 - 09/13 0700 09/13 0701 - 09/14 0700   I.V. (mL/kg) 2125.7 (45) 571.9 (12.1)   IV Piggyback 455 50   Total Intake(mL/kg) 2580.7 (54.7) 621.9 (13.2)   Urine (mL/kg/hr) 310 1400  (3.8)   Total Output 310 1400   Net +2270.7 -778.1          PHYSICAL EXAMINATION: General: No apparent distress.  Neuro: No focal deficits. HEENT: PERRLA, moist mucus membranes Cardiovascular: S1, S2, No M/R/G.  Lungs: Clear antr Abdomen: Soft, + BS. Musculoskeletal: No gross deformities, no edema.  Skin: Intact, warm, no rashes.  LABS:  CBC  Recent Labs Lab 12/22/14 1043 12/26/14 2354 12/27/14 0244  WBC 3.7* 3.0* 4.9  HGB 8.4* 8.1* 7.7*  HCT 25.3* 24.7* 23.7*  PLT 209 105* 100*   Coag's  Recent Labs Lab 12/27/14 0244  APTT 41*   BMET  Recent Labs Lab 12/22/14 1043 12/26/14 2354 12/27/14 0244  NA 137 134* 133*  K 4.0 3.1* 2.8*  CL  --  105 105  CO2 25 16* 21*  BUN 9.'6 14 15  '$ CREATININE 0.6 1.22* 1.20*  GLUCOSE 97 175* 148*   Electrolytes  Recent Labs Lab 12/22/14 1043 12/26/14 2354 12/27/14 0244  CALCIUM 8.6 7.9* 7.4*  MG  --   --  1.7  PHOS  --   --  <1.0*   Sepsis Markers  Recent Labs Lab 12/27/14 0012 12/27/14 0245 12/27/14 0649  LATICACIDVEN 7.00* 2.2* 1.5   ABG No results for input(s): PHART, PCO2ART, PO2ART in the last 168 hours. Liver Enzymes  Recent Labs Lab 12/22/14 1043 12/26/14 2354  AST 13 44*  ALT 11 20  ALKPHOS 96 107  BILITOT 0.99 1.3*  ALBUMIN 3.0* 2.8*   Cardiac Enzymes  Recent Labs Lab 12/27/14 0244  TROPONINI 0.09*   Glucose  Recent Labs Lab 12/27/14 0232  GLUCAP 142*    Imaging Dg Chest Portable 1 View  12/27/2014   CLINICAL DATA:  64 year old female with sepsis  EXAM: PORTABLE CHEST - 1 VIEW  COMPARISON:  CT dated 11/09/2014  FINDINGS: Right pectoral Port-A-Cath with tip at the cavoatrial junction. There is a large area of opacity involving the left lower lung Goette compatible with combination of pleural effusion and associated compressive atelectasis/ pneumonia. The right lung is clear. There is no pneumothorax with the cardiac silhouette is within normal limits. The osseous structures are  grossly unremarkable.  IMPRESSION: Left-sided pleural effusion with left lung base atelectasis/pneumonia. Clinical correlation and follow-up recommended.   Electronically Signed   By: Anner Crete M.D.   On: 12/27/2014 00:21    ASSESSMENT / PLAN:  CARDIOVASCULAR R chest port >>> A:  Sepsis of unclear etiology; favor HCAP - of note, pt was hypotensive at last oncology appointment 12/22/14 was 102/67; and  BP at prior 3 appointment were 91/65, 96/60, 95/51.  Also consider component of hypovolemia from decreased PO intake x 2 - [redacted] weeks along with chemotherapy induced nausea and vomiting. P:  Accept SBP > 90 with clear mental status. Levophed as needed to maintain above goal. Check troponin, cortisol.   INFECTIOUS A:   Sepsis of unclear etiology - favor HCAP.  Also consider component of hypovolemia from decreased PO intake x 2 - [redacted] weeks along with chemotherapy induced nausea and vomiting. P:   BCx2 9/13 > UCx 9/13 > Sputum Cx 9/13 > Abx: Vanc, start date 9/13, day 2/x. Abx: Zosyn, start date 9/13, day 2/x. PCT algorithm not useful in this immunocompromised host.  PULMONARY A: Concern for HCAP. Left pleural effusion - most likely malignant. 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 P:   Continue empiric abx as per ID section. Follow cultures. Pulmonary hygiene. If no improvement we will assess for thoracentesis. Continue heparin gtt in lieu of outpatient xarelto. Levalbuterol PRN. Outpatient follow up with oncology.  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 Pancytopenia - due to chemo. Anemia - due to chemo. P:  Follow up with Dr. Julien Nordmann  as outpatient. Would consider obtaining repeat CT scan of head, chest, abdomen, pelvis while she is hospitalized (Dr. Julien Nordmann was going to have these repeated prior to her follow up appointment in 2 weeks time). SCD's / Heparin gtt. Transfuse per usual guidelines. CBC in AM.  RENAL A:   Hyponatremia. Hypokalemia. Hypophosphatemia Mild AG + NAG acidosis - due to lactate + N / V. AKI. Pseudohypocalcemia - corrects to 8.86. P:   Replete lytes. BMP in AM.  GASTROINTESTINAL A:   Hepatic and splenic mets - due to stage IV NSCLC; currently undergoing chemo under the care of Dr. Julien Nordmann. GERD. Nutrition. P:   Famotidine. Start PO diet Phenergan for nausea.   ENDOCRINE A:   Hyperglycemia - no hx DM. At risk AI - on decadron PRN for nausea. P:   Check Hgb A1c. SSI if glucose consistently > 180. Cortisol is 48. No need for stress dose steroids  NEUROLOGIC A:   Confusion - improving. Bone pain - due to mets. P:   Fentanyl PRN.  Family updated: Pt updated. No family at bedside. Interdisciplinary Family Meeting v Palliative Care Meeting:  Due by: 9/19.  Critical care time 45 mins.  Marshell Garfinkel MD [facility] Pager [phone] If no answer or after 3pm call: [phone] 12/27/2014, 3:08 PM

## 2014-12-27 NOTE — H&P (Signed)
PULMONARY / CRITICAL CARE MEDICINE   Name: Sherri Stafford MRN: [id number] DOB: [birth year]    ADMISSION DATE:  12/26/2014 CONSULTATION DATE:  12/27/2014  REFERRING MD :  EDP  CHIEF COMPLAINT:  confusion  INITIAL PRESENTATION:  64 y.o. F with metastatic NSCLC brought to [facility] ED 9/13 with confusion.  Found to be hypotensive with lactate of 7 and temp of 102.  PCCM called for admission of sepsis of unclear etiology; favor HCAP.    STUDIES:  MRI 09/09/14 >>> decreased size of the left cerebellar lesion with no new lesions. MRI L hip 09/22/14 >>> multiple metastatic bone lesions involving the pelvis. CT Chest / A / P 11/09/14 >>> stable pulmonary mets, stable left lower lobe mass within atelectatic lung, stable left effusion, decrease in size of left supraclavicular lymph node.  Dramatic increase in volume of splenic mets with enlargement of spleen and multiple new lesions.  Multiple new hepatic mets primarily in right hepatic lobe.  Mild increase in periportal adenopathy and stable retroperitoneal adenopathy.  Stable skeletal mets, no evidence of fx.  SIGNIFICANT EVENTS: 9/13 - admit.   HISTORY OF PRESENT ILLNESS:  Sherri Stafford is a 64 y.o. 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  MRI in May 2016 showed decreased size of the left cerebellar lesion with no new lesions.  She is currently undergoing chemotherapy under the care of Dr. Julien Nordmann.  On evening of 9/12 while watching TV, she began to act "strange" per her husband.  He states she wanted to use the restroom but went and stood besides the fire place.  He also states she was not making much sense in what she was saying.  She was in her USOH prior to this with the exception of decreased PO intake x 2 - 3 weeks.  She did have chills starting after her chemo treatment on 9/8 and pt and husband report that  these have continued up through this evening.  She has not had any fevers/sweats, chest pain, SOB, abdominal pain, diarrhea, myalgias.  She has had N / V but this is common due to her chemotherapy.  In ED, she was found to be hypotensive with a lactate of 7.  CXR revealed a left sided pleural effusion with left lung base infiltrate suspicious for ATX vs PNA.  Of note, CT scan from 11/09/14 showed stable LLL mass within the lower left atelectatic lung.   PAST MEDICAL HISTORY :   has a past medical history of Arthritis; Bilateral ovarian cysts; GERD (gastroesophageal reflux disease); Strain of hip flexor (06/2013); Radiation; Radiation (02/10/14-03/06/14); Encounter for antineoplastic chemotherapy (09/19/2014); Cancer; Bone metastases; and Radiation (10/12/14-10/26/14).  has past surgical history that includes Dilation and curettage of uterus (2004); Tubal ligation; and Colonoscopy (04/05/2004). Prior to Admission medications   Medication Sig Start Date End Date Taking? Authorizing Provider  dexamethasone (DECADRON) 4 MG tablet May take 1/2 to 1 tab PO BID PRN nausea. 11/14/14  Yes Susanne Borders, NP  dronabinol (MARINOL) 2.5 MG capsule Take 1 capsule (2.5 mg total) by mouth 2 (two) times daily before a meal. 12/01/14  Yes Owens Shark, NP  fentaNYL (DURAGESIC - DOSED MCG/HR) 50 MCG/HR Place 1 patch (50 mcg total) onto the skin every 3 (three) days. 12/15/14  Yes Susanne Borders, NP  OLANZapine zydis (ZYPREXA ZYDIS) 10 MG  disintegrating tablet For nausea Patient taking differently: Take 10 mg by mouth at bedtime as needed (nasea).  11/01/14  Yes Curt Bears, MD  rivaroxaban (XARELTO) 20 MG TABS tablet Take 1 tablet (20 mg total) by mouth daily with supper. 06/08/14  Yes Adrena E Johnson, PA-C  albuterol (PROVENTIL HFA;VENTOLIN HFA) 108 (90 BASE) MCG/ACT inhaler Inhale 1-2 puffs into the lungs every 6 (six) hours as needed for wheezing or shortness of breath. Patient not taking: Reported on 12/27/2014 01/28/14    Susanne Borders, NP  bisacodyl (DULCOLAX) 10 MG suppository Place 1 suppository (10 mg total) rectally daily as needed for moderate constipation. Patient not taking: Reported on 12/22/2014 01/31/14   Curt Bears, MD  clindamycin (CLEOCIN-T) 1 % external solution Apply topically 2 (two) times daily. Patient not taking: Reported on 12/22/2014 09/15/14   Carlton Adam, PA-C  diphenoxylate-atropine (LOMOTIL) 2.5-0.025 MG per tablet Take 2 tablets by mouth 4 (four) times daily as needed for diarrhea or loose stools. Patient not taking: Reported on 12/22/2014 09/26/14   Susanne Borders, NP  prochlorperazine (COMPAZINE) 10 MG tablet Take 1 tablet (10 mg total) by mouth every 6 (six) hours as needed for nausea or vomiting. Patient not taking: Reported on 12/22/2014 10/14/14   Susanne Borders, NP  sucralfate (CARAFATE) 1 GM/10ML suspension Take 10 mLs (1 g total) by mouth 4 (four) times daily -  with meals and at bedtime. Patient not taking: Reported on 12/27/2014 09/27/14   Gery Pray, MD  traMADol (ULTRAM) 50 MG tablet TAKE 1 TABLET BY MOUTH EVERY 6 HOURS AS NEEDED Patient not taking: Reported on 12/27/2014 02/18/14   Carlton Adam, PA-C   Allergies  Allergen Reactions  . Other     Opiates cause severe constipation. (to the point of being hospitalized)  . Ketoconazole Hives  . Lorazepam Hives    Broke out in hives at [facility]   . Morphine And Related Other (See Comments)    "Constipation to point of being hospitalized"  . Nystatin Hives  . Sporanox [Itraconazole] Hives  . Tylenol [Acetaminophen] Other (See Comments)    Rosanna Randy Syndrome   . Vitamin D Other (See Comments)    Gilbert's syndrome, elevated liver enzymes  . Zofran [Ondansetron Hcl] Hives    Hives at [facility]     FAMILY HISTORY:  Family History  Problem Relation Age of Onset  . Brain cancer Father   . Emphysema Father     smoked  . Pancreatic cancer Mother   . Colon cancer Neg Hx   . Breast cancer Paternal Aunt 55    SOCIAL  HISTORY:  reports that she quit smoking about 30 years ago. Her smoking use included Cigarettes. She has a 14 pack-year smoking history. She has never used smokeless tobacco. She reports that she does not drink alcohol or use illicit drugs.  REVIEW OF SYSTEMS:   All negative; except for those that are bolded, which indicate positives.  Constitutional: weight loss, weight gain, night sweats, fevers, chills, fatigue, weakness.  HEENT: headaches, sore throat, sneezing, nasal congestion, post nasal drip, difficulty swallowing, tooth/dental problems, visual complaints, visual changes, ear aches. Neuro: difficulty with speech, weakness, numbness, ataxia. CV:  chest pain, orthopnea, PND, swelling in lower extremities, dizziness, palpitations, syncope.  Resp: cough, hemoptysis, dyspnea, wheezing. GI  heartburn, indigestion, abdominal pain, nausea, vomiting, diarrhea, constipation, change in bowel habits, loss of appetite, hematemesis, melena, hematochezia.  GU: dysuria, change in color of urine, urgency or frequency, flank pain, hematuria.  MSK: joint pain or swelling, decreased range of motion. Psych: change in mood or affect, depression, anxiety, suicidal ideations, homicidal ideations. Skin: rash, itching, bruising.   SUBJECTIVE:  Per husband, confusion is improving.  VITAL SIGNS: Temp:  [101.8 F (38.8 C)-102.1 F (38.9 C)] 102.1 F (38.9 C) (09/13 0051) Pulse Rate:  [112-178] 112 (09/13 0108) Resp:  [19-30] 19 (09/13 0108) BP: (62-123)/(32-59) 95/48 mmHg (09/13 0108) SpO2:  [97 %-100 %] 100 % (09/13 0108) Weight:  [46.72 kg (103 lb)] 46.72 kg (103 lb) (09/12 2346) HEMODYNAMICS:   VENTILATOR SETTINGS:   INTAKE / OUTPUT: Intake/Output      09/12 0701 - 09/13 0700   I.V. (mL/kg) 1550 (33.2)   Total Intake(mL/kg) 1550 (33.2)   Urine (mL/kg/hr) 210   Total Output 210   Net +1340         PHYSICAL EXAMINATION: General: Adult female, cachectic, in NAD. Neuro: A&O x 3, non-focal.   HEENT: [place]/AT. PERRL, sclerae anicteric. Cardiovascular: Tachy, regular, no M/R/G.  Lungs: Respirations even and unlabored.  CTA bilaterally, No W/R/R. Abdomen: BS x 4, soft, NT/ND.  Musculoskeletal: No gross deformities, no edema.  Skin: Intact, warm, no rashes.  LABS:  CBC  Recent Labs Lab 12/22/14 1043 12/26/14 2354  WBC 3.7* 3.0*  HGB 8.4* 8.1*  HCT 25.3* 24.7*  PLT 209 105*   Coag's No results for input(s): APTT, INR in the last 168 hours. BMET  Recent Labs Lab 12/22/14 1043 12/26/14 2354  NA 137 134*  K 4.0 3.1*  CL  --  105  CO2 25 16*  BUN 9.6 14  CREATININE 0.6 1.22*  GLUCOSE 97 175*   Electrolytes  Recent Labs Lab 12/22/14 1043 12/26/14 2354  CALCIUM 8.6 7.9*   Sepsis Markers  Recent Labs Lab 12/27/14 0012  LATICACIDVEN 7.00*   ABG No results for input(s): PHART, PCO2ART, PO2ART in the last 168 hours. Liver Enzymes  Recent Labs Lab 12/22/14 1043 12/26/14 2354  AST 13 44*  ALT 11 20  ALKPHOS 96 107  BILITOT 0.99 1.3*  ALBUMIN 3.0* 2.8*   Cardiac Enzymes No results for input(s): TROPONINI, PROBNP in the last 168 hours. Glucose No results for input(s): GLUCAP in the last 168 hours.  Imaging Dg Chest Portable 1 View  12/27/2014   CLINICAL DATA:  64 year old female with sepsis  EXAM: PORTABLE CHEST - 1 VIEW  COMPARISON:  CT dated 11/09/2014  FINDINGS: Right pectoral Port-A-Cath with tip at the cavoatrial junction. There is a large area of opacity involving the left lower lung [place] compatible with combination of pleural effusion and associated compressive atelectasis/ pneumonia. The right lung is clear. There is no pneumothorax with the cardiac silhouette is within normal limits. The osseous structures are grossly unremarkable.  IMPRESSION: Left-sided pleural effusion with left lung base atelectasis/pneumonia. Clinical correlation and follow-up recommended.   Electronically Signed   By: Anner Crete M.D.   On: 12/27/2014 00:21     ASSESSMENT / PLAN:  CARDIOVASCULAR R chest port >>> A:  Sepsis of unclear etiology; favor HCAP - of note, pt was hypotensive at last oncology appointment 12/22/14 was 102/67; and BP at prior 3 appointment were 91/65, 96/60, 95/51.  Also consider component of hypovolemia from decreased PO intake x 2 - [redacted] weeks along with chemotherapy induced nausea and vomiting. P:  Accept SBP > 90 with clear mental status. Levophed as needed to maintain above goal. Check troponin, cortisol. Trend lactate.  INFECTIOUS A:   Sepsis of unclear etiology -  favor HCAP.  Also consider component of hypovolemia from decreased PO intake x 2 - [redacted] weeks along with chemotherapy induced nausea and vomiting. P:   BCx2 9/13 > UCx 9/13 > Sputum Cx 9/13 > Abx: Vanc, start date 9/13, day 1/x. Abx: Zosyn, start date 9/13, day 1/x. PCT algorithm not useful in this immunocompromised host.  PULMONARY A: Concern for HCAP. Left pleural effusion - most likely malignant. 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 P:   Continue empiric abx as per ID section. Follow cultures. Pulmonary hygiene. Consider therapeutic and diagnostic thoracentesis. Continue heparin gtt in lieu of outpatient xarelto. Levalbuterol PRN. Outpatient follow up with oncology.  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 Pancytopenia - due to chemo. Anemia - due to chemo. P:  Follow up with Dr. Julien Nordmann as outpatient. Would consider obtaining repeat CT scan of head, chest, abdomen, pelvis while she is hospitalized (Dr. Julien Nordmann was going to have these repeated prior to her follow up appointment in 2 weeks  time). SCD's / Heparin gtt. Transfuse per usual guidelines. CBC in AM.  RENAL A:   Hyponatremia. Hypokalemia. Mild AG + NAG acidosis - due to lactate + N / V. AKI. Pseudohypocalcemia - corrects to 8.86. P:   NS @ 75. Potassium 43mq PO x 1. Send ionized calcium. BMP in AM.  GASTROINTESTINAL A:   Hepatic and splenic mets - due to stage IV NSCLC; currently undergoing chemo under the care of Dr. MJulien Nordmann GERD. Nutrition. P:   Famotidine. NPO for now.  ENDOCRINE A:   Hyperglycemia - no hx DM. At risk AI - on decadron PRN for nausea. P:   Check Hgb A1c. SSI if glucose consistently > 180. Check cortisol. Stress steroids if cortisol < 20.  NEUROLOGIC A:   Confusion - improving. Bone pain - due to mets. P:   Fentanyl PRN.   Family updated: Husband at bedside.  Interdisciplinary Family Meeting v Palliative Care Meeting:  Due by: 9/19.   RMontey Hora PMarfaPulmonary & Critical Care Medicine Pager: [phone] or ([phone]/13/2016, 1:26 AM

## 2014-12-28 ENCOUNTER — Telehealth: Payer: Self-pay | Admitting: *Deleted

## 2014-12-28 ENCOUNTER — Telehealth: Payer: Self-pay | Admitting: Internal Medicine

## 2014-12-28 ENCOUNTER — Inpatient Hospital Stay (HOSPITAL_COMMUNITY): Payer: BC Managed Care – PPO

## 2014-12-28 LAB — APTT
APTT: 84 s — AB (ref 24–37)
aPTT: 103 seconds — ABNORMAL HIGH (ref 24–37)
aPTT: 75 seconds — ABNORMAL HIGH (ref 24–37)

## 2014-12-28 LAB — BASIC METABOLIC PANEL
Anion gap: 7 (ref 5–15)
BUN: 10 mg/dL (ref 6–20)
CHLORIDE: 108 mmol/L (ref 101–111)
CO2: 22 mmol/L (ref 22–32)
CREATININE: 1.07 mg/dL — AB (ref 0.44–1.00)
Calcium: 7 mg/dL — ABNORMAL LOW (ref 8.9–10.3)
GFR calc Af Amer: >60 mL/min (ref >60)
GFR, EST NON AFRICAN AMERICAN: 55 mL/min — AB (ref >60)
GLUCOSE: 92 mg/dL (ref 65–99)
POTASSIUM: 3.2 mmol/L — AB (ref 3.5–5.1)
SODIUM: 137 mmol/L (ref 135–145)

## 2014-12-28 LAB — MAGNESIUM: MAGNESIUM: 2.3 mg/dL (ref 1.7–2.4)

## 2014-12-28 LAB — CBC
HCT: 21.6 % — ABNORMAL LOW (ref 36.0–46.0)
Hemoglobin: 7 g/dL — ABNORMAL LOW (ref 12.0–15.0)
MCH: 26.8 pg (ref 26.0–34.0)
MCHC: 32.4 g/dL (ref 30.0–36.0)
MCV: 82.8 fL (ref 78.0–100.0)
PLATELETS: 51 10*3/uL — AB (ref 150–400)
RBC: 2.61 MIL/uL — ABNORMAL LOW (ref 3.87–5.11)
RDW: 21 % — ABNORMAL HIGH (ref 11.5–15.5)
WBC: 8.3 10*3/uL (ref 4.0–10.5)

## 2014-12-28 LAB — CALCIUM, IONIZED: Calcium, Ionized, Serum: 4.3 mg/dL — ABNORMAL LOW (ref 4.5–5.6)

## 2014-12-28 LAB — URINE CULTURE: Culture: NO GROWTH

## 2014-12-28 LAB — HEPARIN LEVEL (UNFRACTIONATED)
HEPARIN UNFRACTIONATED: 0.14 [IU]/mL — AB (ref 0.30–0.70)
Heparin Unfractionated: 0.16 IU/mL — ABNORMAL LOW (ref 0.30–0.70)
Heparin Unfractionated: 0.11 IU/mL — ABNORMAL LOW (ref 0.30–0.70)

## 2014-12-28 LAB — PHOSPHORUS: Phosphorus: 3.9 mg/dL (ref 2.5–4.6)

## 2014-12-28 LAB — HEMOGLOBIN A1C
Hgb A1c MFr Bld: 6 % — ABNORMAL HIGH (ref 4.8–5.6)
Mean Plasma Glucose: 126 mg/dL

## 2014-12-28 MED ORDER — SODIUM CHLORIDE 0.9 % IV BOLUS (SEPSIS)
500.0000 mL | Freq: Once | INTRAVENOUS | Status: AC
  Administered 2014-12-28: 500 mL via INTRAVENOUS

## 2014-12-28 MED ORDER — POTASSIUM CHLORIDE CRYS ER 20 MEQ PO TBCR
30.0000 meq | EXTENDED_RELEASE_TABLET | ORAL | Status: DC
  Filled 2014-12-28 (×2): qty 1

## 2014-12-28 MED ORDER — POTASSIUM CHLORIDE 10 MEQ/50ML IV SOLN
10.0000 meq | INTRAVENOUS | Status: AC
Start: 2014-12-28 — End: 2014-12-28
  Administered 2014-12-28 (×4): 10 meq via INTRAVENOUS
  Filled 2014-12-28 (×3): qty 50

## 2014-12-28 MED ORDER — SODIUM CHLORIDE 0.9 % IJ SOLN
10.0000 mL | INTRAMUSCULAR | Status: DC | PRN
  Administered 2014-12-31 – 2015-01-03 (×6): 10 mL
  Filled 2014-12-28 (×6): qty 40

## 2014-12-28 MED ORDER — SENNOSIDES-DOCUSATE SODIUM 8.6-50 MG PO TABS
1.0000 | ORAL_TABLET | Freq: Two times a day (BID) | ORAL | Status: DC
  Administered 2014-12-28 – 2015-01-01 (×3): 1 via ORAL
  Filled 2014-12-28 (×10): qty 1

## 2014-12-28 NOTE — Progress Notes (Signed)
Pt refused PO potassium. E link notified.

## 2014-12-28 NOTE — Care Management Note (Signed)
Case Management Note  Patient Details  Name: Sherri Stafford MRN: [id number] Date of Birth: 15-Apr-1951  Subjective/Objective:   Lives at home with her husband - independent at this point, sister in room.  Patient states would like to get a W/C on discharge.  Form on chart on what documentation needs to be present in epic.                    Action/Plan:   Expected Discharge Date:                  Expected Discharge Plan:  Home/Self Care  In-House Referral:     Discharge planning Services     Post Acute Care Choice:    Choice offered to:     DME Arranged:    DME Agency:     HH Arranged:    HH Agency:     Status of Service:  In process, will continue to follow  Medicare Important Message Given:    Date Medicare IM Given:    Medicare IM give by:    Date Additional Medicare IM Given:    Additional Medicare Important Message give by:     If discussed at [place] of Stay Meetings, dates discussed:    Additional Comments:  Vergie Living, RN 12/28/2014, 12:18 PM

## 2014-12-28 NOTE — Telephone Encounter (Signed)
TC from patient inquiring about wheelchair for home use that she states was to be ordered on 12/22/14. Pt currently in patient and does not know when she will be discharged. Reviewed notes from 12/22/14, found no reference of wheelchair need/order. Advised pt that as she is in-patient at this time, her case manager in charge of discharge planning will be the one to order any needed DME at time of discharge. Pt voiced understanding.

## 2014-12-28 NOTE — Progress Notes (Signed)
ANTICOAGULATION/ANTIBIOTIC CONSULT NOTE  Pharmacy Consult for Heparin Indication: h/o PE  Allergies  Allergen Reactions  . Other     Opiates cause severe constipation. (to the point of being hospitalized)  . Ketoconazole Hives  . Lorazepam Hives    Broke out in hives at [facility]   . Morphine And Related Other (See Comments)    "Constipation to point of being hospitalized"  . Nystatin Hives  . Sporanox [Itraconazole] Hives  . Tylenol [Acetaminophen] Other (See Comments)    Rosanna Randy Syndrome   . Vitamin D Other (See Comments)    Gilbert's syndrome, elevated liver enzymes  . Zofran [Ondansetron Hcl] Hives    Hives at [facility]     Patient Measurements: Height: '5\' 3"'$  (160 cm) Weight: 104 lb 0.9 oz (47.2 kg) IBW/kg (Calculated) : 52.4 Heparin Dosing Weight: 46 kg  Vital Signs: Temp: 98 F (36.7 C) (09/14 0035) Temp Source: Oral (09/14 0035) BP: 100/49 mmHg (09/14 0000) Pulse Rate: 84 (09/14 0000)  Labs:  Recent Labs  12/26/14 2354 12/27/14 0244 12/28/14 0111  HGB 8.1* 7.7*  --   HCT 24.7* 23.7*  --   PLT 105* 100*  --   APTT  --  41*  --   HEPARINUNFRC  --  1.02* 0.16*  CREATININE 1.22* 1.20*  --   TROPONINI  --  0.09*  --     Estimated Creatinine Clearance: 36.7 mL/min (by C-G formula based on Cr of 1.2). No results for input(s): VANCOTROUGH, VANCOPEAK, VANCORANDOM, GENTTROUGH, GENTPEAK, GENTRANDOM, TOBRATROUGH, TOBRAPEAK, TOBRARND, AMIKACINPEAK, AMIKACINTROU, AMIKACIN in the last 72 hours.   Microbiology: Recent Results (from the past 720 hour(s))  TECHNOLOGIST REVIEW     Status: None   Collection Time: 12/22/14 10:43 AM  Result Value Ref Range Status   Technologist Review   Final    Rare Metas and Myelocytes present, rare nRBC, moderate ovalocytes, few teardrops  MRSA PCR Screening     Status: None   Collection Time: 12/27/14  2:25 AM  Result Value Ref Range Status   MRSA by PCR NEGATIVE NEGATIVE Final    Comment:        The GeneXpert MRSA Assay (FDA approved  for NASAL specimens only), is one component of a comprehensive MRSA colonization surveillance program. It is not intended to diagnose MRSA infection nor to guide or monitor treatment for MRSA infections.     Medical History: Past Medical History  Diagnosis Date  . Arthritis   . Bilateral ovarian cysts   . GERD (gastroesophageal reflux disease)   . Strain of hip flexor 06/2013    left side torn  . Radiation     at [facility] to left hip, sacrum and brain  . Radiation 02/10/14-03/06/14    left central chest 35 gray  . Encounter for antineoplastic chemotherapy 09/19/2014  . Cancer     lung ca  . Bone metastases     to left hip and spine  . Radiation 10/12/14-10/26/14    pelvis region 20 gray    Medications:  See electronic med rec  Assessment: 64 y.o. female presents with AMS and fever. Code sepsis called - likely source PNA.  Anticoagulation: Xarelto PTA for h/o PE. Dose '20mg'$  daily - last taken 9/12 with supper. Holding Xarelto for now and to start heparin gtt per pharmacy. Hgb 8.1 (low but stable), plt 105 on admit (usually ~200) - ?due to chemo treatment. Will likely need to use aPTT to monitor heparin as Xarelto may still be affecting heparin level.  F/u aPTT is 103 on heparin 700 units/hr. Nurse reports no issues with infusion or bleeding.  Goal of Therapy:  Heparin level 0.3-0.7 aPTT 66-102 sec Will monitor platelets according to anticoagulation protocol  Plan:  Decrease heparin to 650 units/hr 6h aPTT/HL Daily heparin level, aPTT, and CBC while pt on heparin  Andrey Cota. Diona Foley, PharmD Clinical Pharmacist Pager [phone]  12/28/2014,1:55 AM

## 2014-12-28 NOTE — Telephone Encounter (Signed)
returned call and lvm to confirm that 9.15 appt cx per pt request

## 2014-12-28 NOTE — Progress Notes (Signed)
PULMONARY / CRITICAL CARE MEDICINE   Name: ANI DEOLIVEIRA MRN: [id number] DOB: 1950-08-06    ADMISSION DATE:  12/26/2014 CONSULTATION DATE:  12/28/2014  REFERRING MD :  EDP  CHIEF COMPLAINT: Confusion  INITIAL PRESENTATION:  64 y.o. F with metastatic NSCLC brought to [facility] ED 9/13 with confusion.  Found to be hypotensive with lactate of 7 and temp of 102.  PCCM called for admission of sepsis of unclear etiology; favor HCAP.    STUDIES:  MRI 09/09/14 >>> decreased size of the left cerebellar lesion with no new lesions. MRI L hip 09/22/14 >>> multiple metastatic bone lesions involving the pelvis. CT Chest / A / P 11/09/14 >>> stable pulmonary mets, stable left lower lobe mass within atelectatic lung, stable left effusion, decrease in size of left supraclavicular lymph node.  Dramatic increase in volume of splenic mets with enlargement of spleen and multiple new lesions.  Multiple new hepatic mets primarily in right hepatic lobe.  Mild increase in periportal adenopathy and stable retroperitoneal adenopathy.  Stable skeletal mets, no evidence of fx.  SIGNIFICANT EVENTS: 9/13 - admit. 9/14 Weaning down norepi   HISTORY OF PRESENT ILLNESS:  MARKASIA CARROL is a 64 y.o. 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  MRI in May 2016 showed decreased size of the left cerebellar lesion with no new lesions.  She is currently undergoing chemotherapy under the care of Dr. Julien Nordmann.  On evening of 9/12 while watching TV, she began to act "strange" per her husband.  He states she wanted to use the restroom but went and stood besides the fire place.  He also states she was not making much sense in what she was saying.  She was in her USOH prior to this with the exception of decreased PO intake x 2 - 3 weeks.  She did have chills starting after her chemo treatment on 9/8 and pt  and husband report that these have continued up through this evening.  She has not had any fevers/sweats, chest pain, SOB, abdominal pain, diarrhea, myalgias.  She has had N / V but this is common due to her chemotherapy.  In ED, she was found to be hypotensive with a lactate of 7.  CXR revealed a left sided pleural effusion with left lung base infiltrate suspicious for ATX vs PNA.  Of note, CT scan from 11/09/14 showed stable LLL mass within the lower left atelectatic lung.   PAST MEDICAL HISTORY :   has a past medical history of Arthritis; Bilateral ovarian cysts; GERD (gastroesophageal reflux disease); Strain of hip flexor (06/2013); Radiation; Radiation (02/10/14-03/06/14); Encounter for antineoplastic chemotherapy (09/19/2014); Cancer; Bone metastases; and Radiation (10/12/14-10/26/14).  has past surgical history that includes Dilation and curettage of uterus (2004); Tubal ligation; and Colonoscopy (04/05/2004). Prior to Admission medications   Medication Sig Start Date End Date Taking? Authorizing Provider  dexamethasone (DECADRON) 4 MG tablet May take 1/2 to 1 tab PO BID PRN nausea. 11/14/14  Yes Susanne Borders, NP  dronabinol (MARINOL) 2.5 MG capsule Take 1 capsule (2.5 mg total) by mouth 2 (two) times daily before a meal. 12/01/14  Yes Owens Shark, NP  fentaNYL (DURAGESIC - DOSED MCG/HR) 50 MCG/HR Place 1 patch (50 mcg total) onto the skin every 3 (three) days. 12/15/14  Yes Susanne Borders, NP  OLANZapine zydis [facility]  ZYDIS) 10 MG disintegrating tablet For nausea Patient taking differently: Take 10 mg by mouth at bedtime as needed (nasea).  11/01/14  Yes Curt Bears, MD  rivaroxaban (XARELTO) 20 MG TABS tablet Take 1 tablet (20 mg total) by mouth daily with supper. 06/08/14  Yes Adrena E Johnson, PA-C  albuterol (PROVENTIL HFA;VENTOLIN HFA) 108 (90 BASE) MCG/ACT inhaler Inhale 1-2 puffs into the lungs every 6 (six) hours as needed for wheezing or shortness of breath. Patient not taking:  Reported on 12/27/2014 01/28/14   Susanne Borders, NP  bisacodyl (DULCOLAX) 10 MG suppository Place 1 suppository (10 mg total) rectally daily as needed for moderate constipation. Patient not taking: Reported on 12/22/2014 01/31/14   Curt Bears, MD  clindamycin (CLEOCIN-T) 1 % external solution Apply topically 2 (two) times daily. Patient not taking: Reported on 12/22/2014 09/15/14   Carlton Adam, PA-C  diphenoxylate-atropine (LOMOTIL) 2.5-0.025 MG per tablet Take 2 tablets by mouth 4 (four) times daily as needed for diarrhea or loose stools. Patient not taking: Reported on 12/22/2014 09/26/14   Susanne Borders, NP  prochlorperazine (COMPAZINE) 10 MG tablet Take 1 tablet (10 mg total) by mouth every 6 (six) hours as needed for nausea or vomiting. Patient not taking: Reported on 12/22/2014 10/14/14   Susanne Borders, NP  sucralfate (CARAFATE) 1 GM/10ML suspension Take 10 mLs (1 g total) by mouth 4 (four) times daily -  with meals and at bedtime. Patient not taking: Reported on 12/27/2014 09/27/14   Gery Pray, MD  traMADol (ULTRAM) 50 MG tablet TAKE 1 TABLET BY MOUTH EVERY 6 HOURS AS NEEDED Patient not taking: Reported on 12/27/2014 02/18/14   Carlton Adam, PA-C   Allergies  Allergen Reactions  . Other     Opiates cause severe constipation. (to the point of being hospitalized)  . Ketoconazole Hives  . Lorazepam Hives    Broke out in hives at [facility]   . Morphine And Related Other (See Comments)    "Constipation to point of being hospitalized"  . Nystatin Hives  . Sporanox [Itraconazole] Hives  . Tylenol [Acetaminophen] Other (See Comments)    Rosanna Randy Syndrome   . Vitamin D Other (See Comments)    Gilbert's syndrome, elevated liver enzymes  . Zofran [Ondansetron Hcl] Hives    Hives at [facility]     FAMILY HISTORY:  Family History  Problem Relation Age of Onset  . Brain cancer Father   . Emphysema Father     smoked  . Pancreatic cancer Mother   . Colon cancer Neg Hx   . Breast cancer  Paternal Aunt 65    SOCIAL HISTORY:  reports that she quit smoking about 30 years ago. Her smoking use included Cigarettes. She has a 14 pack-year smoking history. She has never used smokeless tobacco. She reports that she does not drink alcohol or use illicit drugs.  REVIEW OF SYSTEMS:   All negative; except for those that are bolded, which indicate positives.  Constitutional: weight loss, weight gain, night sweats, fevers, chills, fatigue, weakness.  HEENT: headaches, sore throat, sneezing, nasal congestion, post nasal drip, difficulty swallowing, tooth/dental problems, visual complaints, visual changes, ear aches. Neuro: difficulty with speech, weakness, numbness, ataxia. CV:  chest pain, orthopnea, PND, swelling in lower extremities, dizziness, palpitations, syncope.  Resp: cough, hemoptysis, dyspnea, wheezing. GI  heartburn, indigestion, abdominal pain, nausea, vomiting, diarrhea, constipation, change in bowel habits, loss of appetite, hematemesis, melena, hematochezia.  GU: dysuria, change in color of urine, urgency or frequency,  flank pain, hematuria. MSK: joint pain or swelling, decreased range of motion. Psych: change in mood or affect, depression, anxiety, suicidal ideations, homicidal ideations. Skin: rash, itching, bruising.  SUBJECTIVE:  No major issues. Weaning down norepi. Lactate has normalized.  VITAL SIGNS: Temp:  [97.5 F (36.4 C)-98.3 F (36.8 C)] 98.3 F (36.8 C) (09/14 0802) Pulse Rate:  [77-96] 83 (09/14 0400) Resp:  [13-23] 14 (09/14 0400) BP: (78-121)/(43-68) 93/43 mmHg (09/14 0400) SpO2:  [98 %-100 %] 100 % (09/14 0400) Weight:  [111 lb 8.8 oz (50.6 kg)] 111 lb 8.8 oz (50.6 kg) (09/14 0445) HEMODYNAMICS:   VENTILATOR SETTINGS:   INTAKE / OUTPUT: Intake/Output      09/13 0701 - 09/14 0700 09/14 0701 - 09/15 0700   I.V. (mL/kg) 2659.6 (52.6)    IV Piggyback 400    Total Intake(mL/kg) 3059.6 (60.5)    Urine (mL/kg/hr) 3000 (2.5)    Total Output 3000      Net +59.6            PHYSICAL EXAMINATION: General: No apparent distress.  Neuro: No focal deficits. HEENT: PERRLA, moist mucus membranes Cardiovascular: S1, S2, No M/R/G.  Lungs: Clear antr Abdomen: Soft, + BS. Musculoskeletal: No gross deformities, no edema.  Skin: Intact, warm, no rashes.  LABS:  CBC  Recent Labs Lab 12/26/14 2354 12/27/14 0244 12/28/14 0453  WBC 3.0* 4.9 8.3  HGB 8.1* 7.7* 7.0*  HCT 24.7* 23.7* 21.6*  PLT 105* 100* 51*   Coag's  Recent Labs Lab 12/27/14 0244 12/28/14 0111 12/28/14 0830  APTT 41* 103* 84*   BMET  Recent Labs Lab 12/26/14 2354 12/27/14 0244 12/28/14 0453  NA 134* 133* 137  K 3.1* 2.8* 3.2*  CL 105 105 108  CO2 16* 21* 22  BUN '14 15 10  '$ CREATININE 1.22* 1.20* 1.07*  GLUCOSE 175* 148* 92   Electrolytes  Recent Labs Lab 12/26/14 2354 12/27/14 0244 12/28/14 0453  CALCIUM 7.9* 7.4* 7.0*  MG  --  1.7 2.3  PHOS  --  <1.0* 3.9   Sepsis Markers  Recent Labs Lab 12/27/14 0012 12/27/14 0245 12/27/14 0649  LATICACIDVEN 7.00* 2.2* 1.5   ABG No results for input(s): PHART, PCO2ART, PO2ART in the last 168 hours. Liver Enzymes  Recent Labs Lab 12/22/14 1043 12/26/14 2354  AST 13 44*  ALT 11 20  ALKPHOS 96 107  BILITOT 0.99 1.3*  ALBUMIN 3.0* 2.8*   Cardiac Enzymes  Recent Labs Lab 12/27/14 0244  TROPONINI 0.09*   Glucose  Recent Labs Lab 12/27/14 0232  GLUCAP 142*    Imaging Dg Chest Port 1 View  12/28/2014   CLINICAL DATA:  Acute respiratory failure.  Shortness of breath.  EXAM: PORTABLE CHEST - 1 VIEW  COMPARISON:  04/26/2014.  FINDINGS: Cardiopericardial silhouette appears unchanged. RIGHT IJ power port is also unchanged. LEFT lower lobe collapse/ consider set LEFT basilar collapse/ consolidation remains present, without interval change. There is a LEFT pleural effusion. Pulmonary nodules are suboptimally visualized due to portable technique.  IMPRESSION:  No interval change with LEFT  pleural effusion and LEFT mid and lower lung collapse/consolidation.   Electronically Signed   By: Dereck Ligas M.D.   On: 12/28/2014 07:01    ASSESSMENT / PLAN:  CARDIOVASCULAR R chest port >>> A:  Sepsis of unclear etiology; favor HCAP - of note, pt was hypotensive at last oncology appointment 12/22/14 was 102/67; and BP at prior 3 appointment were 91/65, 96/60, 95/51.  Also consider component of hypovolemia  from decreased PO intake x 2 - [redacted] weeks along with chemotherapy induced nausea and vomiting. P:  Accept SBP > 90 with clear mental status. Levophed as needed to maintain above goal.  INFECTIOUS A:   Sepsis of unclear etiology - favor HCAP.  Also consider component of hypovolemia from decreased PO intake x 2 - [redacted] weeks along with chemotherapy induced nausea and vomiting. P:   BCx2 9/13 > UCx 9/13 > Sputum Cx 9/13 > Abx: Vanc, start date 9/13, day 2/x. Abx: Zosyn, start date 9/13, day 2/x. PCT algorithm not useful in this immunocompromised host.  PULMONARY A: Concern for HCAP. Left pleural effusion - most likely malignant. 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 P:   Continue empiric abx as per ID section. Follow cultures. Pulmonary hygiene. If no improvement we will assess for thoracentesis. Continue heparin gtt in lieu of outpatient xarelto. Levalbuterol PRN. Outpatient follow up with oncology.  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 Pancytopenia - due to chemo. Anemia - due to chemo. P:  Follow up with Dr. Julien Nordmann as outpatient. Would consider obtaining repeat CT scan of head, chest, abdomen, pelvis while she is hospitalized  (Dr. Julien Nordmann was going to have these repeated prior to her follow up appointment in 2 weeks time). SCD's / Heparin gtt. Transfuse per usual guidelines. CBC in AM.  RENAL A:   Hyponatremia. Hypokalemia. Hypophosphatemia Mild AG + NAG acidosis - due to lactate + N / V. AKI. Pseudohypocalcemia - corrects to 8.86. P:   Replete lytes. BMP in AM.  GASTROINTESTINAL A:   Hepatic and splenic mets - due to stage IV NSCLC; currently undergoing chemo under the care of Dr. Julien Nordmann. GERD. Nutrition. P:   Famotidine. Start PO diet Phenergan for nausea.   ENDOCRINE A:   Hyperglycemia - no hx DM. At risk AI - on decadron PRN for nausea. P:   Check Hgb A1c. SSI if glucose consistently > 180. Cortisol is 48. No need for stress dose steroids  NEUROLOGIC A:   Confusion - improving. Bone pain - due to mets. P:   Fentanyl PRN.  Family updated: Pt updated. No family at bedside. Interdisciplinary Family Meeting v Palliative Care Meeting:  Due by: 9/19.  Critical care time 45 mins.  Marshell Garfinkel MD [facility] Pager [phone] If no answer or after 3pm call: [phone] 12/28/2014, 9:14 AM

## 2014-12-28 NOTE — Telephone Encounter (Signed)
Order for wheelchair was faxed to [facility] on 12/23/14 with receipt of order. I will notify [facility].

## 2014-12-28 NOTE — Progress Notes (Signed)
[facility] ADULT ICU REPLACEMENT PROTOCOL FOR AM LAB REPLACEMENT ONLY  The patient does apply for the [facility] Adult ICU Electrolyte Replacment Protocol based on the criteria listed below:   1. Is GFR >/= 40 ml/min? Yes.    Patient's GFR today is 55 2. Is urine output >/= 0.5 ml/kg/hr for the last 6 hours? Yes.   Patient's UOP is 1.4 ml/kg/hr 3. Is BUN < 60 mg/dL? Yes.    Patient's BUN today is 10 4. Abnormal electrolyte  K 3.2 5. Ordered repletion with: per protocol 6. If a panic level lab has been reported, has the CCM MD in charge been notified? Yes.  .   Physician:  E Deterding  Christeen Douglas 12/28/2014 5:32 AM

## 2014-12-28 NOTE — Progress Notes (Addendum)
ANTICOAGULATION/ANTIBIOTIC CONSULT NOTE  Pharmacy Consult for Heparin Indication: h/o PE  Allergies  Allergen Reactions  . Other     Opiates cause severe constipation. (to the point of being hospitalized)  . Ketoconazole Hives  . Lorazepam Hives    Broke out in hives at [facility]   . Morphine And Related Other (See Comments)    "Constipation to point of being hospitalized"  . Nystatin Hives  . Sporanox [Itraconazole] Hives  . Tylenol [Acetaminophen] Other (See Comments)    Rosanna Randy Syndrome   . Vitamin D Other (See Comments)    Gilbert's syndrome, elevated liver enzymes  . Zofran [Ondansetron Hcl] Hives    Hives at [facility]     Patient Measurements: Height: '5\' 3"'$  (160 cm) Weight: 111 lb 8.8 oz (50.6 kg) IBW/kg (Calculated) : 52.4 Heparin Dosing Weight: 46 kg  Vital Signs: Temp: 98.3 F (36.8 C) (09/14 0802) Temp Source: Oral (09/14 0802) BP: 115/73 mmHg (09/14 0915) Pulse Rate: 89 (09/14 0915)  Labs:  Recent Labs  12/26/14 2354 12/27/14 0244 12/28/14 0111 12/28/14 0453 12/28/14 0830  HGB 8.1* 7.7*  --  7.0*  --   HCT 24.7* 23.7*  --  21.6*  --   PLT 105* 100*  --  51*  --   APTT  --  41* 103*  --  84*  HEPARINUNFRC  --  1.02* 0.16*  --  0.14*  CREATININE 1.22* 1.20*  --  1.07*  --   TROPONINI  --  0.09*  --   --   --     Estimated Creatinine Clearance: 44.1 mL/min (by C-G formula based on Cr of 1.07). No results for input(s): VANCOTROUGH, VANCOPEAK, VANCORANDOM, GENTTROUGH, GENTPEAK, GENTRANDOM, TOBRATROUGH, TOBRAPEAK, TOBRARND, AMIKACINPEAK, AMIKACINTROU, AMIKACIN in the last 72 hours.   Microbiology: Recent Results (from the past 720 hour(s))  TECHNOLOGIST REVIEW     Status: None   Collection Time: 12/22/14 10:43 AM  Result Value Ref Range Status   Technologist Review   Final    Rare Metas and Myelocytes present, rare nRBC, moderate ovalocytes, few teardrops  Urine culture     Status: None   Collection Time: 12/27/14 12:14 AM  Result Value Ref Range Status    Specimen Description URINE, CATHETERIZED  Final   Special Requests NONE  Final   Culture NO GROWTH 1 DAY  Final   Report Status 12/28/2014 FINAL  Final  MRSA PCR Screening     Status: None   Collection Time: 12/27/14  2:25 AM  Result Value Ref Range Status   MRSA by PCR NEGATIVE NEGATIVE Final    Comment:        The GeneXpert MRSA Assay (FDA approved for NASAL specimens only), is one component of a comprehensive MRSA colonization surveillance program. It is not intended to diagnose MRSA infection nor to guide or monitor treatment for MRSA infections.     Medical History: Past Medical History  Diagnosis Date  . Arthritis   . Bilateral ovarian cysts   . GERD (gastroesophageal reflux disease)   . Strain of hip flexor 06/2013    left side torn  . Radiation     at [facility] to left hip, sacrum and brain  . Radiation 02/10/14-03/06/14    left central chest 35 gray  . Encounter for antineoplastic chemotherapy 09/19/2014  . Cancer     lung ca  . Bone metastases     to left hip and spine  . Radiation 10/12/14-10/26/14    pelvis region 20 gray  Medications:  See EMR  Assessment: Anticoagulation: Xarelto PTA for h/o PE. Dose '20mg'$  daily - last taken 9/12 with supper. Holding Xarelto for now, pt has been transitioned to heparin gtt. Hgb 7, plt down to 51. Myelosuppresion and plts likely due to chemotherapy.   aptt and HL are still not correlating, ptt is therapeutic, HL is subtherapeutic, aptt is more reliable in this patient.   Goal of Therapy:  Heparin level 0.3-0.7 aPTT 66-102 sec Will monitor platelets according to anticoagulation protocol  Plan:  Continue heparin to 650 units/hr 6h aPTT/HL Daily heparin level, aPTT, and CBC while pt on heparin Watch hgb, plts, monitor s/sx bleeding    Hughes Better, PharmD, BCPS Clinical Pharmacist Pager: [phone] 12/28/2014 10:50 AM     Addendum -aptt therapeutic, HL subtherapeutic, -aptt trending down, watch  closely -continue current rate  Harvel Quale 12/28/2014 4:05 PM

## 2014-12-29 ENCOUNTER — Inpatient Hospital Stay (HOSPITAL_COMMUNITY): Payer: BC Managed Care – PPO

## 2014-12-29 ENCOUNTER — Other Ambulatory Visit: Payer: BC Managed Care – PPO

## 2014-12-29 LAB — ABO/RH: ABO/RH(D): O POS

## 2014-12-29 LAB — CBC
HCT: 19 % — ABNORMAL LOW (ref 36.0–46.0)
HEMATOCRIT: 23.9 % — AB (ref 36.0–46.0)
HEMOGLOBIN: 7.8 g/dL — AB (ref 12.0–15.0)
Hemoglobin: 6.2 g/dL — CL (ref 12.0–15.0)
MCH: 27 pg (ref 26.0–34.0)
MCH: 27.5 pg (ref 26.0–34.0)
MCHC: 32.6 g/dL (ref 30.0–36.0)
MCHC: 32.6 g/dL (ref 30.0–36.0)
MCV: 82.6 fL (ref 78.0–100.0)
MCV: 84.2 fL (ref 78.0–100.0)
PLATELETS: 35 10*3/uL — AB (ref 150–400)
Platelets: 29 10*3/uL — CL (ref 150–400)
RBC: 2.3 MIL/uL — ABNORMAL LOW (ref 3.87–5.11)
RBC: 2.84 MIL/uL — ABNORMAL LOW (ref 3.87–5.11)
RDW: 21.4 % — AB (ref 11.5–15.5)
RDW: 20 % — AB (ref 11.5–15.5)
WBC: 6.2 10*3/uL (ref 4.0–10.5)
WBC: 7.5 10*3/uL (ref 4.0–10.5)

## 2014-12-29 LAB — BASIC METABOLIC PANEL
Anion gap: 7 (ref 5–15)
BUN: 7 mg/dL (ref 6–20)
CHLORIDE: 107 mmol/L (ref 101–111)
CO2: 21 mmol/L — AB (ref 22–32)
CREATININE: 0.86 mg/dL (ref 0.44–1.00)
Calcium: 6.6 mg/dL — ABNORMAL LOW (ref 8.9–10.3)
GFR calc Af Amer: >60 mL/min (ref >60)
GFR calc non Af Amer: >60 mL/min (ref >60)
Glucose, Bld: 83 mg/dL (ref 65–99)
Potassium: 3.2 mmol/L — ABNORMAL LOW (ref 3.5–5.1)
Sodium: 135 mmol/L (ref 135–145)

## 2014-12-29 LAB — HEPARIN LEVEL (UNFRACTIONATED)
Heparin Unfractionated: <0.1 IU/mL — ABNORMAL LOW (ref 0.30–0.70)
Heparin Unfractionated: <0.1 IU/mL — ABNORMAL LOW (ref 0.30–0.70)

## 2014-12-29 LAB — APTT
aPTT: 62 seconds — ABNORMAL HIGH (ref 24–37)
aPTT: 67 seconds — ABNORMAL HIGH (ref 24–37)

## 2014-12-29 LAB — PREPARE RBC (CROSSMATCH)

## 2014-12-29 MED ORDER — SODIUM CHLORIDE 0.9 % IV BOLUS (SEPSIS)
500.0000 mL | Freq: Once | INTRAVENOUS | Status: AC
  Administered 2014-12-29: 500 mL via INTRAVENOUS

## 2014-12-29 MED ORDER — SODIUM CHLORIDE 0.9 % IV SOLN
Freq: Once | INTRAVENOUS | Status: AC
Start: 2014-12-29 — End: 2015-01-03
  Administered 2015-01-03: 13:00:00 via INTRAVENOUS

## 2014-12-29 MED ORDER — SODIUM CHLORIDE 0.9 % IV SOLN: Freq: Once | INTRAVENOUS | Status: AC

## 2014-12-29 MED ORDER — SODIUM CHLORIDE 0.9 % IV SOLN: INTRAVENOUS | Status: DC

## 2014-12-29 MED ORDER — POTASSIUM CHLORIDE 10 MEQ/50ML IV SOLN
10.0000 meq | INTRAVENOUS | Status: AC
  Administered 2014-12-29 (×4): 10 meq via INTRAVENOUS
  Filled 2014-12-29 (×4): qty 50

## 2014-12-29 MED ORDER — HEPARIN BOLUS VIA INFUSION
1500.0000 [IU] | Freq: Once | INTRAVENOUS | Status: AC
  Administered 2014-12-29: 1500 [IU] via INTRAVENOUS
  Filled 2014-12-29: qty 1500

## 2014-12-29 NOTE — Progress Notes (Signed)
ANTICOAGULATION/ANTIBIOTIC CONSULT NOTE  Pharmacy Consult for Heparin Indication: h/o PE  Allergies  Allergen Reactions  . Other     Opiates cause severe constipation. (to the point of being hospitalized)  . Ketoconazole Hives  . Lorazepam Hives    Broke out in hives at [facility]   . Morphine And Related Other (See Comments)    "Constipation to point of being hospitalized"  . Nystatin Hives  . Sporanox [Itraconazole] Hives  . Tylenol [Acetaminophen] Other (See Comments)    Rosanna Randy Syndrome   . Vitamin D Other (See Comments)    Gilbert's syndrome, elevated liver enzymes  . Zofran [Ondansetron Hcl] Hives    Hives at [facility]     Patient Measurements: Height: '5\' 3"'$  (160 cm) Weight: 108 lb 0.4 oz (49 kg) IBW/kg (Calculated) : 52.4 Heparin Dosing Weight: 46 kg  Vital Signs: Temp: 98.1 F (36.7 C) (09/15 0345) Temp Source: Oral (09/15 0345) BP: 100/53 mmHg (09/15 0600) Pulse Rate: 81 (09/15 0600)  Labs:  Recent Labs  12/26/14 2354 12/27/14 0244  12/28/14 0453 12/28/14 0830 12/28/14 1450 12/29/14 0550 12/29/14 0551  HGB 8.1* 7.7*  --  7.0*  --   --   --   --   HCT 24.7* 23.7*  --  21.6*  --   --   --   --   PLT 105* 100*  --  51*  --   --   --   --   APTT  --  41*  < >  --  84* 75* 62*  --   HEPARINUNFRC  --  1.02*  < >  --  0.14* 0.11*  --  <0.10*  CREATININE 1.22* 1.20*  --  1.07*  --   --   --   --   TROPONINI  --  0.09*  --   --   --   --   --   --   < > = values in this interval not displayed.  Estimated Creatinine Clearance: 42.7 mL/min (by C-G formula based on Cr of 1.07). No results for input(s): VANCOTROUGH, VANCOPEAK, VANCORANDOM, GENTTROUGH, GENTPEAK, GENTRANDOM, TOBRATROUGH, TOBRAPEAK, TOBRARND, AMIKACINPEAK, AMIKACINTROU, AMIKACIN in the last 72 hours.   Microbiology: Recent Results (from the past 720 hour(s))  TECHNOLOGIST REVIEW     Status: None   Collection Time: 12/22/14 10:43 AM  Result Value Ref Range Status   Technologist Review   Final    Rare  Metas and Myelocytes present, rare nRBC, moderate ovalocytes, few teardrops  Blood Culture (routine x 2)     Status: None (Preliminary result)   Collection Time: 12/26/14 11:45 PM  Result Value Ref Range Status   Specimen Description BLOOD RIGHT HAND  Final   Special Requests BOTTLES DRAWN AEROBIC ONLY 2CC  Final   Culture NO GROWTH 1 DAY  Final   Report Status PENDING  Incomplete  Blood Culture (routine x 2)     Status: None (Preliminary result)   Collection Time: 12/26/14 11:54 PM  Result Value Ref Range Status   Specimen Description BLOOD PICC LINE  Final   Special Requests BOTTLES DRAWN AEROBIC ONLY 5CC  Final   Culture NO GROWTH 1 DAY  Final   Report Status PENDING  Incomplete  Urine culture     Status: None   Collection Time: 12/27/14 12:14 AM  Result Value Ref Range Status   Specimen Description URINE, CATHETERIZED  Final   Special Requests NONE  Final   Culture NO GROWTH 1 DAY  Final   Report Status 12/28/2014 FINAL  Final  MRSA PCR Screening     Status: None   Collection Time: 12/27/14  2:25 AM  Result Value Ref Range Status   MRSA by PCR NEGATIVE NEGATIVE Final    Comment:        The GeneXpert MRSA Assay (FDA approved for NASAL specimens only), is one component of a comprehensive MRSA colonization surveillance program. It is not intended to diagnose MRSA infection nor to guide or monitor treatment for MRSA infections.     Medical History: Past Medical History  Diagnosis Date  . Arthritis   . Bilateral ovarian cysts   . GERD (gastroesophageal reflux disease)   . Strain of hip flexor 06/2013    left side torn  . Radiation     at [facility] to left hip, sacrum and brain  . Radiation 02/10/14-03/06/14    left central chest 35 gray  . Encounter for antineoplastic chemotherapy 09/19/2014  . Cancer     lung ca  . Bone metastases     to left hip and spine  . Radiation 10/12/14-10/26/14    pelvis region 20 gray    Medications:  See  EMR  Assessment: Anticoagulation: Xarelto PTA for h/o PE. Dose '20mg'$  daily - last taken 9/12 with supper. Holding Xarelto for now, pt has been transitioned to heparin gtt. Hgb 7, plt down to 51. Myelosuppresion and plts likely due to chemotherapy.   aptt and HL are still not correlating, ptt is therapeutic, HL is subtherapeutic, aptt is more reliable in this patient.  APTT is 62 and HL is undetectable on heparin 650 units/hr.  Nurse reports no issues with infusion or bleeding.  Goal of Therapy:  Heparin level 0.3-0.7 aPTT 66-102 sec Will monitor platelets according to anticoagulation protocol  Plan:  Increase heparin to 800 units/hr 6h aPTT/HL Daily heparin level, aPTT, and CBC while pt on heparin Watch hgb, plts, monitor s/sx bleeding   Andrey Cota. Diona Foley, PharmD Clinical Pharmacist Pager [phone] 12/29/2014 6:36 AM

## 2014-12-29 NOTE — Progress Notes (Signed)
ANTICOAGULATION/ANTIBIOTIC CONSULT NOTE  Pharmacy Consult for Heparin Indication: h/o PE  Allergies  Allergen Reactions  . Other     Opiates cause severe constipation. (to the point of being hospitalized)  . Ketoconazole Hives  . Lorazepam Hives    Broke out in hives at [facility]   . Morphine And Related Other (See Comments)    "Constipation to point of being hospitalized"  . Nystatin Hives  . Sporanox [Itraconazole] Hives  . Tylenol [Acetaminophen] Other (See Comments)    Rosanna Randy Syndrome   . Vitamin D Other (See Comments)    Gilbert's syndrome, elevated liver enzymes  . Zofran [Ondansetron Hcl] Hives    Hives at [facility]     Patient Measurements: Height: '5\' 3"'$  (160 cm) Weight: 108 lb 0.4 oz (49 kg) IBW/kg (Calculated) : 52.4 Heparin Dosing Weight: 46 kg  Vital Signs: Temp: 98.6 F (37 C) (09/15 0829) Temp Source: Oral (09/15 0829) BP: 100/48 mmHg (09/15 1330) Pulse Rate: 89 (09/15 1330)  Labs:  Recent Labs  12/27/14 0244  12/28/14 0453  12/28/14 1450 12/29/14 0550 12/29/14 0551 12/29/14 1300  HGB 7.7*  --  7.0*  --   --  6.2*  --   --   HCT 23.7*  --  21.6*  --   --  19.0*  --   --   PLT 100*  --  51*  --   --  35*  --   --   APTT 41*  < >  --   < > 75* 62*  --  67*  HEPARINUNFRC 1.02*  < >  --   < > 0.11*  --  <0.10* <0.10*  CREATININE 1.20*  --  1.07*  --   --  0.86  --   --   TROPONINI 0.09*  --   --   --   --   --   --   --   < > = values in this interval not displayed.  Estimated Creatinine Clearance: 53.1 mL/min (by C-G formula based on Cr of 0.86). No results for input(s): VANCOTROUGH, VANCOPEAK, VANCORANDOM, GENTTROUGH, GENTPEAK, GENTRANDOM, TOBRATROUGH, TOBRAPEAK, TOBRARND, AMIKACINPEAK, AMIKACINTROU, AMIKACIN in the last 72 hours.   Microbiology: Recent Results (from the past 720 hour(s))  TECHNOLOGIST REVIEW     Status: None   Collection Time: 12/22/14 10:43 AM  Result Value Ref Range Status   Technologist Review   Final    Rare Metas and  Myelocytes present, rare nRBC, moderate ovalocytes, few teardrops  Blood Culture (routine x 2)     Status: None (Preliminary result)   Collection Time: 12/26/14 11:45 PM  Result Value Ref Range Status   Specimen Description BLOOD RIGHT HAND  Final   Special Requests BOTTLES DRAWN AEROBIC ONLY 2CC  Final   Culture NO GROWTH 2 DAYS  Final   Report Status PENDING  Incomplete  Blood Culture (routine x 2)     Status: None (Preliminary result)   Collection Time: 12/26/14 11:54 PM  Result Value Ref Range Status   Specimen Description BLOOD PICC LINE  Final   Special Requests BOTTLES DRAWN AEROBIC ONLY 5CC  Final   Culture NO GROWTH 2 DAYS  Final   Report Status PENDING  Incomplete  Urine culture     Status: None   Collection Time: 12/27/14 12:14 AM  Result Value Ref Range Status   Specimen Description URINE, CATHETERIZED  Final   Special Requests NONE  Final   Culture NO GROWTH 1 DAY  Final  Report Status 12/28/2014 FINAL  Final  MRSA PCR Screening     Status: None   Collection Time: 12/27/14  2:25 AM  Result Value Ref Range Status   MRSA by PCR NEGATIVE NEGATIVE Final    Comment:        The GeneXpert MRSA Assay (FDA approved for NASAL specimens only), is one component of a comprehensive MRSA colonization surveillance program. It is not intended to diagnose MRSA infection nor to guide or monitor treatment for MRSA infections.     Medical History: Past Medical History  Diagnosis Date  . Arthritis   . Bilateral ovarian cysts   . GERD (gastroesophageal reflux disease)   . Strain of hip flexor 06/2013    left side torn  . Radiation     at [facility] to left hip, sacrum and brain  . Radiation 02/10/14-03/06/14    left central chest 35 gray  . Encounter for antineoplastic chemotherapy 09/19/2014  . Cancer     lung ca  . Bone metastases     to left hip and spine  . Radiation 10/12/14-10/26/14    pelvis region 20 gray    Medications:  See EMR  Assessment: Anticoagulation:  Xarelto PTA for h/o PE. Dose 20 mg daily - last taken 9/12 with supper. Holding Xarelto for now, pt has been transitioned to heparin gtt. Hgb 7 > 6.2, plt down to 35.  Myelosuppresion and plts likely due to chemotherapy.   aptt and HL are correlating, ptt is barely therapeutic, HL is subtherapeutic, will increase rate since Xarelto is likely out of system by now.  Pt received 1 unit of PRBC due to hgb, no signs of active bleeding.   Goal of Therapy:  Heparin level 0.3-0.7 Will monitor platelets according to anticoagulation protocol  Plan:  Increase heparin to 1100 units/hr Recheck HL this evening Daily heparin level, CBC while pt on heparin Watch hgb, plts, monitor s/sx bleeding    Hughes Better, PharmD, BCPS Clinical Pharmacist Pager: [phone] 12/29/2014 3:20 PM

## 2014-12-29 NOTE — Progress Notes (Signed)
CRITICAL VALUE ALERT  Critical value received: Hgb 6.2  Date of notification:  12/29/14  Time of notification:  1000  Critical value read back: yes  Nurse who received alert: Lbass  MD notified (1st page):  Manum  Time of first page: 1000  MD notified (2nd page):  Time of second page:  Responding MD: Manum  Time MD responded: 1000

## 2014-12-29 NOTE — Progress Notes (Signed)
PULMONARY / CRITICAL CARE MEDICINE   Name: Sherri Stafford MRN: [id number] DOB: 10-May-1950    ADMISSION DATE:  12/26/2014 CONSULTATION DATE:  12/29/2014  REFERRING MD :  EDP  CHIEF COMPLAINT: Confusion  INITIAL PRESENTATION:  64 y.o. F with metastatic NSCLC brought to [facility] ED 9/13 with confusion.  Found to be hypotensive with lactate of 7 and temp of 102.  PCCM called for admission of sepsis of unclear etiology; favor HCAP.    STUDIES:  MRI 09/09/14 >>> decreased size of the left cerebellar lesion with no new lesions. MRI L hip 09/22/14 >>> multiple metastatic bone lesions involving the pelvis. CT Chest / A / P 11/09/14 >>> stable pulmonary mets, stable left lower lobe mass within atelectatic lung, stable left effusion, decrease in size of left supraclavicular lymph node.  Dramatic increase in volume of splenic mets with enlargement of spleen and multiple new lesions.  Multiple new hepatic mets primarily in right hepatic lobe.  Mild increase in periportal adenopathy and stable retroperitoneal adenopathy.  Stable skeletal mets, no evidence of fx.  SIGNIFICANT EVENTS: 9/13 - admit. 9/15 Weaned off nor epi. But BP is boderline.    HISTORY OF PRESENT ILLNESS:  Sherri Stafford is a 64 y.o. 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  She previously had mets to brain which was treated with radiation at [facility] in 2015.  MRI in May 2016 showed decreased size of the left cerebellar lesion with no new lesions.  She is currently undergoing chemotherapy under the care of Dr. Julien Nordmann.  On evening of 9/12 while watching TV, she began to act "strange" per her husband.  He states she wanted to use the restroom but went and stood besides the fire place.  He also states she was not making much sense in what she was saying.  She was in her USOH prior to this with the exception of decreased PO intake x 2 - 3 weeks.  She did have chills starting after her chemo  treatment on 9/8 and pt and husband report that these have continued up through this evening.  She has not had any fevers/sweats, chest pain, SOB, abdominal pain, diarrhea, myalgias.  She has had N / V but this is common due to her chemotherapy.  In ED, she was found to be hypotensive with a lactate of 7.  CXR revealed a left sided pleural effusion with left lung base infiltrate suspicious for ATX vs PNA.  Of note, CT scan from 11/09/14 showed stable LLL mass within the lower left atelectatic lung.   PAST MEDICAL HISTORY :   has a past medical history of Arthritis; Bilateral ovarian cysts; GERD (gastroesophageal reflux disease); Strain of hip flexor (06/2013); Radiation; Radiation (02/10/14-03/06/14); Encounter for antineoplastic chemotherapy (09/19/2014); Cancer; Bone metastases; and Radiation (10/12/14-10/26/14).  has past surgical history that includes Dilation and curettage of uterus (2004); Tubal ligation; and Colonoscopy (04/05/2004). Prior to Admission medications   Medication Sig Start Date End Date Taking? Authorizing Provider  dexamethasone (DECADRON) 4 MG tablet May take 1/2 to 1 tab PO BID PRN nausea. 11/14/14  Yes Susanne Borders, NP  dronabinol (MARINOL) 2.5 MG capsule Take 1 capsule (2.5 mg total) by mouth 2 (two) times daily before a meal. 12/01/14  Yes Owens Shark, NP  fentaNYL (DURAGESIC - DOSED MCG/HR) 50 MCG/HR Place 1 patch (50 mcg total) onto the skin every 3 (three) days. 12/15/14  Yes Derek Jack  Berniece Salines, NP  OLANZapine zydis (ZYPREXA ZYDIS) 10 MG disintegrating tablet For nausea Patient taking differently: Take 10 mg by mouth at bedtime as needed (nasea).  11/01/14  Yes Curt Bears, MD  rivaroxaban (XARELTO) 20 MG TABS tablet Take 1 tablet (20 mg total) by mouth daily with supper. 06/08/14  Yes Adrena E Johnson, PA-C  albuterol (PROVENTIL HFA;VENTOLIN HFA) 108 (90 BASE) MCG/ACT inhaler Inhale 1-2 puffs into the lungs every 6 (six) hours as needed for wheezing or shortness of  breath. Patient not taking: Reported on 12/27/2014 01/28/14   Susanne Borders, NP  bisacodyl (DULCOLAX) 10 MG suppository Place 1 suppository (10 mg total) rectally daily as needed for moderate constipation. Patient not taking: Reported on 12/22/2014 01/31/14   Curt Bears, MD  clindamycin (CLEOCIN-T) 1 % external solution Apply topically 2 (two) times daily. Patient not taking: Reported on 12/22/2014 09/15/14   Carlton Adam, PA-C  diphenoxylate-atropine (LOMOTIL) 2.5-0.025 MG per tablet Take 2 tablets by mouth 4 (four) times daily as needed for diarrhea or loose stools. Patient not taking: Reported on 12/22/2014 09/26/14   Susanne Borders, NP  prochlorperazine (COMPAZINE) 10 MG tablet Take 1 tablet (10 mg total) by mouth every 6 (six) hours as needed for nausea or vomiting. Patient not taking: Reported on 12/22/2014 10/14/14   Susanne Borders, NP  sucralfate (CARAFATE) 1 GM/10ML suspension Take 10 mLs (1 g total) by mouth 4 (four) times daily -  with meals and at bedtime. Patient not taking: Reported on 12/27/2014 09/27/14   Gery Pray, MD  traMADol (ULTRAM) 50 MG tablet TAKE 1 TABLET BY MOUTH EVERY 6 HOURS AS NEEDED Patient not taking: Reported on 12/27/2014 02/18/14   Carlton Adam, PA-C   Allergies  Allergen Reactions  . Other     Opiates cause severe constipation. (to the point of being hospitalized)  . Ketoconazole Hives  . Lorazepam Hives    Broke out in hives at [facility]   . Morphine And Related Other (See Comments)    "Constipation to point of being hospitalized"  . Nystatin Hives  . Sporanox [Itraconazole] Hives  . Tylenol [Acetaminophen] Other (See Comments)    Rosanna Randy Syndrome   . Vitamin D Other (See Comments)    Gilbert's syndrome, elevated liver enzymes  . Zofran [Ondansetron Hcl] Hives    Hives at [facility]     FAMILY HISTORY:  Family History  Problem Relation Age of Onset  . Brain cancer Father   . Emphysema Father     smoked  . Pancreatic cancer Mother   . Colon cancer  Neg Hx   . Breast cancer Paternal Aunt 42    SOCIAL HISTORY:  reports that she quit smoking about 30 years ago. Her smoking use included Cigarettes. She has a 14 pack-year smoking history. She has never used smokeless tobacco. She reports that she does not drink alcohol or use illicit drugs.  REVIEW OF SYSTEMS:   All negative; except for those that are bolded, which indicate positives.  Constitutional: weight loss, weight gain, night sweats, fevers, chills, fatigue, weakness.  HEENT: headaches, sore throat, sneezing, nasal congestion, post nasal drip, difficulty swallowing, tooth/dental problems, visual complaints, visual changes, ear aches. Neuro: difficulty with speech, weakness, numbness, ataxia. CV:  chest pain, orthopnea, PND, swelling in lower extremities, dizziness, palpitations, syncope.  Resp: cough, hemoptysis, dyspnea, wheezing. GI  heartburn, indigestion, abdominal pain, nausea, vomiting, diarrhea, constipation, change in bowel habits, loss of appetite, hematemesis, melena, hematochezia.  GU: dysuria, change in  color of urine, urgency or frequency, flank pain, hematuria. MSK: joint pain or swelling, decreased range of motion. Psych: change in mood or affect, depression, anxiety, suicidal ideations, homicidal ideations. Skin: rash, itching, bruising.  SUBJECTIVE:  No major issues. Weaning down norepi. Lactate has normalized.  VITAL SIGNS: Temp:  [97.7 F (36.5 C)-98.6 F (37 C)] 98.6 F (37 C) (09/15 0829) Pulse Rate:  [77-102] 92 (09/15 1100) Resp:  [11-21] 20 (09/15 1100) BP: (75-118)/(43-68) 76/45 mmHg (09/15 1100) SpO2:  [98 %-100 %] 100 % (09/15 1100) Weight:  [108 lb 0.4 oz (49 kg)] 108 lb 0.4 oz (49 kg) (09/15 0500) HEMODYNAMICS:   VENTILATOR SETTINGS:   INTAKE / OUTPUT: Intake/Output      09/14 0701 - 09/15 0700 09/15 0701 - 09/16 0700   P.O. 250    I.V. (mL/kg) 2654.2 (54.2) 32 (0.7)   IV Piggyback 450 150   Total Intake(mL/kg) 3354.2 (68.5) 182 (3.7)    Urine (mL/kg/hr) 2100 (1.8) 450 (2.1)   Emesis/NG output 0 (0)    Stool  1 (0)   Blood  100 (0.5)   Total Output 2100 551   Net +1254.2 -369        Emesis Occurrence 2 x      PHYSICAL EXAMINATION: General: No apparent distress. Sitting in chair.  Neuro: No focal deficits. HEENT: PERRLA, moist mucus membranes Cardiovascular: S1, S2, No M/R/G.  Lungs: Clear antr Abdomen: Soft, + BS. Musculoskeletal: No gross deformities, no edema.  Skin: Intact, warm, no rashes.  LABS:  CBC  Recent Labs Lab 12/27/14 0244 12/28/14 0453 12/29/14 0550  WBC 4.9 8.3 6.2  HGB 7.7* 7.0* 6.2*  HCT 23.7* 21.6* 19.0*  PLT 100* 51* PENDING   Coag's  Recent Labs Lab 12/28/14 0830 12/28/14 1450 12/29/14 0550  APTT 84* 75* 62*   BMET  Recent Labs Lab 12/27/14 0244 12/28/14 0453 12/29/14 0550  NA 133* 137 135  K 2.8* 3.2* 3.2*  CL 105 108 107  CO2 21* 22 21*  BUN '15 10 7  '$ CREATININE 1.20* 1.07* 0.86  GLUCOSE 148* 92 83   Electrolytes  Recent Labs Lab 12/27/14 0244 12/28/14 0453 12/29/14 0550  CALCIUM 7.4* 7.0* 6.6*  MG 1.7 2.3  --   PHOS <1.0* 3.9  --    Sepsis Markers  Recent Labs Lab 12/27/14 0012 12/27/14 0245 12/27/14 0649  LATICACIDVEN 7.00* 2.2* 1.5   ABG No results for input(s): PHART, PCO2ART, PO2ART in the last 168 hours. Liver Enzymes  Recent Labs Lab 12/26/14 2354  AST 44*  ALT 20  ALKPHOS 107  BILITOT 1.3*  ALBUMIN 2.8*   Cardiac Enzymes  Recent Labs Lab 12/27/14 0244  TROPONINI 0.09*   Glucose  Recent Labs Lab 12/27/14 0232  GLUCAP 142*    Imaging Dg Chest Port 1 View  12/29/2014   CLINICAL DATA:  Respiratory failure.  EXAM: PORTABLE CHEST - 1 VIEW  COMPARISON:  12/28/2014.  CT 11/08/2014.  FINDINGS: Power port catheter noted in stable position. Heart size stable. Persistent left mid lung and left lower lobe infiltrate and pleural effusion. Left lower lobe mass and pulmonary metastatic nodules better demonstrated by prior recent  CT. No pneumothorax. No acute bony abnormality .  IMPRESSION: 1. Power port in stable position. 2. Persistent left mid lung and left lower lobe infiltrate and left pleural effusion. No interim improvement. 3. Left lower lobe pulmonary mass and bilateral metastatic pulmonary nodules better demonstrated by recent CT.   Electronically Signed   By:  [place]   On: 12/29/2014 07:22    ASSESSMENT / PLAN:  CARDIOVASCULAR R chest port >>> A:  Sepsis of unclear etiology; favor HCAP - of note, pt was hypotensive at last oncology appointment 12/22/14 was 102/67; and BP at prior 3 appointment were 91/65, 96/60, 95/51.  Also consider component of hypovolemia from decreased PO intake x 2 - [redacted] weeks along with chemotherapy induced nausea and vomiting. P:  Accept SBP > 90 with clear mental status. Bolus 500cc fluid.  NS at 75 cc/hr as PO intake is poor.   INFECTIOUS A:   Sepsis of unclear etiology - favor HCAP.  Also consider component of hypovolemia from decreased PO intake x 2 - [redacted] weeks along with chemotherapy induced nausea and vomiting. P:   BCx2 9/13 > UCx 9/13 > Sputum Cx 9/13 > Abx: Vanc, start date 9/13, day 3/x. Abx: Zosyn, start date 9/13, day 3/x. PCT algorithm not useful in this immunocompromised host.  PULMONARY A: Concern for HCAP. Left pleural effusion - most likely malignant. 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 P:   Continue empiric abx as per ID section. Follow cultures. Pulmonary hygiene. Continue heparin gtt in lieu of outpatient xarelto. Levalbuterol PRN. Outpatient follow up with oncology.  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 Known PE - on  xarelto. Pancytopenia - due to chemo. Anemia - due to chemo. P:  Follow up with Dr. Julien Nordmann as outpatient. Would consider obtaining repeat CT scan of head, chest, abdomen, pelvis while she is hospitalized (Dr. Julien Nordmann was going to have these repeated prior to her follow up appointment in 2 weeks time). SCD's / Heparin gtt. Hb low today. No evidence of bleed. Will transfuse 1 unit.   RENAL A:   Hyponatremia. Hypokalemia. Hypophosphatemia Mild AG + NAG acidosis - due to lactate + N / V. AKI. Pseudohypocalcemia - corrects to 8.86. P:   Replete lytes. BMP in AM.  GASTROINTESTINAL A:   Hepatic and splenic mets - due to stage IV NSCLC; currently undergoing chemo under the care of Dr. Julien Nordmann. GERD. Nutrition. P:   Famotidine. Start PO diet Phenergan for nausea.   ENDOCRINE A:   Hyperglycemia - no hx DM. At risk AI - on decadron PRN for nausea. P:   Check Hgb A1c. SSI if glucose consistently > 180. Cortisol is 48. No need for stress dose steroids  NEUROLOGIC A:   Confusion - improving. Bone pain - due to mets. P:   Fentanyl PRN.  Family updated: Pt updated. No family at bedside. Interdisciplinary Family Meeting v Palliative Care Meeting:  Due by: 9/19.  Critical care time 45 mins.  Marshell Garfinkel MD [facility] Pager [phone] If no answer or after 3pm call: [phone] 12/29/2014, 11:20 AM

## 2014-12-29 NOTE — Progress Notes (Addendum)
ANTICOAGULATION/ANTIBIOTIC CONSULT NOTE  Pharmacy Consult for Heparin Indication: h/o PE  Allergies  Allergen Reactions  . Other     Opiates cause severe constipation. (to the point of being hospitalized)  . Ketoconazole Hives  . Lorazepam Hives    Broke out in hives at [facility]   . Morphine And Related Other (See Comments)    "Constipation to point of being hospitalized"  . Nystatin Hives  . Sporanox [Itraconazole] Hives  . Tylenol [Acetaminophen] Other (See Comments)    Rosanna Randy Syndrome   . Vitamin D Other (See Comments)    Gilbert's syndrome, elevated liver enzymes  . Zofran [Ondansetron Hcl] Hives    Hives at [facility]     Patient Measurements: Height: '5\' 3"'$  (160 cm) Weight: 108 lb 0.4 oz (49 kg) IBW/kg (Calculated) : 52.4 Heparin Dosing Weight: 46 kg  Vital Signs: Temp: 98.3 F (36.8 C) (09/15 1938) Temp Source: Oral (09/15 1740) BP: 105/53 mmHg (09/15 2100) Pulse Rate: 84 (09/15 2100)  Labs:  Recent Labs  12/27/14 0244  12/28/14 0453  12/28/14 1450 12/29/14 0550 12/29/14 0551 12/29/14 1300 12/29/14 2231 12/29/14 2232  HGB 7.7*  --  7.0*  --   --  6.2*  --   --   --  7.8*  HCT 23.7*  --  21.6*  --   --  19.0*  --   --   --  23.9*  PLT 100*  --  51*  --   --  35*  --   --   --  29*  APTT 41*  < >  --   < > 75* 62*  --  67*  --   --   HEPARINUNFRC 1.02*  < >  --   < > 0.11*  --  <0.10* <0.10* <0.10*  --   CREATININE 1.20*  --  1.07*  --   --  0.86  --   --   --   --   TROPONINI 0.09*  --   --   --   --   --   --   --   --   --   < > = values in this interval not displayed.  Estimated Creatinine Clearance: 53.1 mL/min (by C-G formula based on Cr of 0.86). No results for input(s): VANCOTROUGH, VANCOPEAK, VANCORANDOM, GENTTROUGH, GENTPEAK, GENTRANDOM, TOBRATROUGH, TOBRAPEAK, TOBRARND, AMIKACINPEAK, AMIKACINTROU, AMIKACIN in the last 72 hours.   Microbiology: Recent Results (from the past 720 hour(s))  TECHNOLOGIST REVIEW     Status: None   Collection Time:  12/22/14 10:43 AM  Result Value Ref Range Status   Technologist Review   Final    Rare Metas and Myelocytes present, rare nRBC, moderate ovalocytes, few teardrops  Blood Culture (routine x 2)     Status: None (Preliminary result)   Collection Time: 12/26/14 11:45 PM  Result Value Ref Range Status   Specimen Description BLOOD RIGHT HAND  Final   Special Requests BOTTLES DRAWN AEROBIC ONLY 2CC  Final   Culture NO GROWTH 2 DAYS  Final   Report Status PENDING  Incomplete  Blood Culture (routine x 2)     Status: None (Preliminary result)   Collection Time: 12/26/14 11:54 PM  Result Value Ref Range Status   Specimen Description BLOOD PICC LINE  Final   Special Requests BOTTLES DRAWN AEROBIC ONLY 5CC  Final   Culture NO GROWTH 2 DAYS  Final   Report Status PENDING  Incomplete  Urine culture     Status:  None   Collection Time: 12/27/14 12:14 AM  Result Value Ref Range Status   Specimen Description URINE, CATHETERIZED  Final   Special Requests NONE  Final   Culture NO GROWTH 1 DAY  Final   Report Status 12/28/2014 FINAL  Final  MRSA PCR Screening     Status: None   Collection Time: 12/27/14  2:25 AM  Result Value Ref Range Status   MRSA by PCR NEGATIVE NEGATIVE Final    Comment:        The GeneXpert MRSA Assay (FDA approved for NASAL specimens only), is one component of a comprehensive MRSA colonization surveillance program. It is not intended to diagnose MRSA infection nor to guide or monitor treatment for MRSA infections.     Medical History: Past Medical History  Diagnosis Date  . Arthritis   . Bilateral ovarian cysts   . GERD (gastroesophageal reflux disease)   . Strain of hip flexor 06/2013    left side torn  . Radiation     at [facility] to left hip, sacrum and brain  . Radiation 02/10/14-03/06/14    left central chest 35 gray  . Encounter for antineoplastic chemotherapy 09/19/2014  . Cancer     lung ca  . Bone metastases     to left hip and spine  . Radiation  10/12/14-10/26/14    pelvis region 20 gray    Medications:  See EMR  Assessment: Anticoagulation: Xarelto PTA for h/o PE. Dose 20 mg daily - last taken 9/12 with supper. Holding Xarelto for now, pt has been transitioned to heparin gtt. Hgb 7 > 6.2, plt down to 35.  Myelosuppresion and plts likely due to chemotherapy.   aptt and HL are correlating, ptt is barely therapeutic, HL is subtherapeutic, will increase rate since Xarelto is likely out of system by now.  Pt received 1 unit of PRBC due to hgb, no signs of active bleeding.   HL remains undetectable on heparin 1100 units/hr. Nurse reports no issues with infusion or bleeding.  Goal of Therapy:  Heparin level 0.3-0.7 Will monitor platelets according to anticoagulation protocol  Plan:  Bolus heparin 1500 units then increase heparin to 1250 units/hr 6h HL Daily heparin level, CBC while pt on heparin Watch hgb, plts, monitor s/sx bleeding   Andrey Cota. Diona Foley, PharmD Clinical Pharmacist Pager [phone]  12/29/2014 11:07 PM    ADDN: F/u HL remains subtherapeutic at 0.18 on heparin 1250 units/hr. Nurse reports no issues with infusion or bleeding.  Plan: Increase heparin to 1450 units/hr 6h HL  Andrey Cota. Diona Foley, PharmD Clinical Pharmacist Pager [phone]

## 2014-12-30 ENCOUNTER — Inpatient Hospital Stay (HOSPITAL_COMMUNITY): Payer: BC Managed Care – PPO

## 2014-12-30 DIAGNOSIS — E46 Unspecified protein-calorie malnutrition: Secondary | ICD-10-CM

## 2014-12-30 DIAGNOSIS — D6481 Anemia due to antineoplastic chemotherapy: Secondary | ICD-10-CM

## 2014-12-30 DIAGNOSIS — Z86711 Personal history of pulmonary embolism: Secondary | ICD-10-CM

## 2014-12-30 LAB — TYPE AND SCREEN
ABO/RH(D): O POS
Antibody Screen: NEGATIVE
Unit division: 0

## 2014-12-30 LAB — BASIC METABOLIC PANEL
ANION GAP: 7 (ref 5–15)
BUN: <5 mg/dL — ABNORMAL LOW (ref 6–20)
CALCIUM: 6.7 mg/dL — AB (ref 8.9–10.3)
CO2: 19 mmol/L — AB (ref 22–32)
Chloride: 111 mmol/L (ref 101–111)
Creatinine, Ser: 0.76 mg/dL (ref 0.44–1.00)
Glucose, Bld: 77 mg/dL (ref 65–99)
POTASSIUM: 3.6 mmol/L (ref 3.5–5.1)
Sodium: 137 mmol/L (ref 135–145)

## 2014-12-30 LAB — CBC
HEMATOCRIT: 23.1 % — AB (ref 36.0–46.0)
Hemoglobin: 7.7 g/dL — ABNORMAL LOW (ref 12.0–15.0)
MCH: 28.1 pg (ref 26.0–34.0)
MCHC: 33.3 g/dL (ref 30.0–36.0)
MCV: 84.3 fL (ref 78.0–100.0)
Platelets: 31 10*3/uL — ABNORMAL LOW (ref 150–400)
RBC: 2.74 MIL/uL — AB (ref 3.87–5.11)
RDW: 20.3 % — AB (ref 11.5–15.5)
WBC: 7 10*3/uL (ref 4.0–10.5)

## 2014-12-30 LAB — HEPARIN LEVEL (UNFRACTIONATED): Heparin Unfractionated: 0.18 IU/mL — ABNORMAL LOW (ref 0.30–0.70)

## 2014-12-30 MED ORDER — DRONABINOL 2.5 MG PO CAPS
5.0000 mg | ORAL_CAPSULE | Freq: Two times a day (BID) | ORAL | Status: DC
  Administered 2014-12-31 – 2015-01-03 (×7): 5 mg via ORAL
  Filled 2014-12-30 (×8): qty 2

## 2014-12-30 MED ORDER — RIVAROXABAN 20 MG PO TABS
20.0000 mg | ORAL_TABLET | Freq: Every day | ORAL | Status: DC
  Filled 2014-12-30: qty 1

## 2014-12-30 MED ORDER — FAMOTIDINE 20 MG PO TABS
20.0000 mg | ORAL_TABLET | Freq: Every day | ORAL | Status: DC
  Filled 2014-12-30: qty 1

## 2014-12-30 NOTE — Progress Notes (Addendum)
PULMONARY / CRITICAL CARE MEDICINE   Name: Sherri Stafford MRN: [id number] DOB: August 05, 1950    ADMISSION DATE:  12/26/2014 CONSULTATION DATE:  12/30/2014  REFERRING MD :  EDP  CHIEF COMPLAINT: Confusion  INITIAL PRESENTATION:  65 y.o. F with metastatic NSCLC brought to [facility] ED 9/13 with confusion.  Found to be hypotensive with lactate of 7 and temp of 102.  PCCM called for admission of sepsis of unclear etiology; favor HCAP.    STUDIES:  MRI 09/09/14 >>> decreased size of the left cerebellar lesion with no new lesions. MRI L hip 09/22/14 >>> multiple metastatic bone lesions involving the pelvis. CT Chest / A / P 11/09/14 >>> stable pulmonary mets, stable left lower lobe mass within atelectatic lung, stable left effusion, decrease in size of left supraclavicular lymph node.  Dramatic increase in volume of splenic mets with enlargement of spleen and multiple new lesions.  Multiple new hepatic mets primarily in right hepatic lobe.  Mild increase in periportal adenopathy and stable retroperitoneal adenopathy.  Stable skeletal mets, no evidence of fx.  SIGNIFICANT EVENTS: 9/13 - admit. 9/15 Weaned off nor epi. Transfused 1 unit PRBC for low Hb.    HISTORY OF PRESENT ILLNESS:  Sherri Stafford is a 64 y.o. 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  MRI in May 2016 showed decreased size of the left cerebellar lesion with no new lesions.  She is currently undergoing chemotherapy under the care of Dr. Julien Nordmann.  On evening of 9/12 while watching TV, she began to act "strange" per her husband.  He states she wanted to use the restroom but went and stood besides the fire place.  He also states she was not making much sense in what she was saying.  She was in her USOH prior to this with the exception of decreased PO intake x 2 - 3 weeks.  She did have chills starting  after her chemo treatment on 9/8 and pt and husband report that these have continued up through this evening.  She has not had any fevers/sweats, chest pain, SOB, abdominal pain, diarrhea, myalgias.  She has had N / V but this is common due to her chemotherapy.  In ED, she was found to be hypotensive with a lactate of 7.  CXR revealed a left sided pleural effusion with left lung base infiltrate suspicious for ATX vs PNA.  Of note, CT scan from 11/09/14 showed stable LLL mass within the lower left atelectatic lung.   PAST MEDICAL HISTORY :   has a past medical history of Arthritis; Bilateral ovarian cysts; GERD (gastroesophageal reflux disease); Strain of hip flexor (06/2013); Radiation; Radiation (02/10/14-03/06/14); Encounter for antineoplastic chemotherapy (09/19/2014); Cancer; Bone metastases; and Radiation (10/12/14-10/26/14).  has past surgical history that includes Dilation and curettage of uterus (2004); Tubal ligation; and Colonoscopy (04/05/2004). Prior to Admission medications   Medication Sig Start Date End Date Taking? Authorizing Provider  dexamethasone (DECADRON) 4 MG tablet May take 1/2 to 1 tab PO BID PRN nausea. 11/14/14  Yes Susanne Borders, NP  dronabinol (MARINOL) 2.5 MG capsule Take 1 capsule (2.5 mg total) by mouth 2 (two) times daily before a meal. 12/01/14  Yes Owens Shark, NP  fentaNYL (DURAGESIC - DOSED MCG/HR) 50 MCG/HR Place 1 patch (50 mcg total) onto the skin every 3 (three) days. 12/15/14  Yes Susanne Borders, NP  OLANZapine zydis (ZYPREXA ZYDIS) 10 MG disintegrating tablet For nausea Patient taking differently: Take 10 mg by mouth at bedtime as needed (nasea).  11/01/14  Yes Curt Bears, MD  rivaroxaban (XARELTO) 20 MG TABS tablet Take 1 tablet (20 mg total) by mouth daily with supper. 06/08/14  Yes Adrena E Johnson, PA-C  albuterol (PROVENTIL HFA;VENTOLIN HFA) 108 (90 BASE) MCG/ACT inhaler Inhale 1-2 puffs into the lungs every 6 (six) hours as needed for wheezing or  shortness of breath. Patient not taking: Reported on 12/27/2014 01/28/14   Susanne Borders, NP  bisacodyl (DULCOLAX) 10 MG suppository Place 1 suppository (10 mg total) rectally daily as needed for moderate constipation. Patient not taking: Reported on 12/22/2014 01/31/14   Curt Bears, MD  clindamycin (CLEOCIN-T) 1 % external solution Apply topically 2 (two) times daily. Patient not taking: Reported on 12/22/2014 09/15/14   Carlton Adam, PA-C  diphenoxylate-atropine (LOMOTIL) 2.5-0.025 MG per tablet Take 2 tablets by mouth 4 (four) times daily as needed for diarrhea or loose stools. Patient not taking: Reported on 12/22/2014 09/26/14   Susanne Borders, NP  prochlorperazine (COMPAZINE) 10 MG tablet Take 1 tablet (10 mg total) by mouth every 6 (six) hours as needed for nausea or vomiting. Patient not taking: Reported on 12/22/2014 10/14/14   Susanne Borders, NP  sucralfate (CARAFATE) 1 GM/10ML suspension Take 10 mLs (1 g total) by mouth 4 (four) times daily -  with meals and at bedtime. Patient not taking: Reported on 12/27/2014 09/27/14   Gery Pray, MD  traMADol (ULTRAM) 50 MG tablet TAKE 1 TABLET BY MOUTH EVERY 6 HOURS AS NEEDED Patient not taking: Reported on 12/27/2014 02/18/14   Carlton Adam, PA-C   Allergies  Allergen Reactions  . Other     Opiates cause severe constipation. (to the point of being hospitalized)  . Ketoconazole Hives  . Lorazepam Hives    Broke out in hives at [facility]   . Morphine And Related Other (See Comments)    "Constipation to point of being hospitalized"  . Nystatin Hives  . Sporanox [Itraconazole] Hives  . Tylenol [Acetaminophen] Other (See Comments)    Rosanna Randy Syndrome   . Vitamin D Other (See Comments)    Gilbert's syndrome, elevated liver enzymes  . Zofran [Ondansetron Hcl] Hives    Hives at [facility]     FAMILY HISTORY:  Family History  Problem Relation Age of Onset  . Brain cancer Father   . Emphysema Father     smoked  . Pancreatic cancer Mother   .  Colon cancer Neg Hx   . Breast cancer Paternal Aunt 65    SOCIAL HISTORY:  reports that she quit smoking about 30 years ago. Her smoking use included Cigarettes. She has a 14 pack-year smoking history. She has never used smokeless tobacco. She reports that she does not drink alcohol or use illicit drugs.  REVIEW OF SYSTEMS:   All negative; except for those that are bolded, which indicate positives.  Constitutional: weight loss, weight gain, night sweats, fevers, chills, fatigue, weakness.  HEENT: headaches, sore throat, sneezing, nasal congestion, post nasal drip, difficulty swallowing, tooth/dental problems, visual complaints, visual changes, ear aches. Neuro: difficulty with speech, weakness, numbness, ataxia. CV:  chest pain, orthopnea, PND, swelling in lower extremities, dizziness, palpitations, syncope.  Resp: cough, hemoptysis, dyspnea, wheezing. GI  heartburn, indigestion, abdominal pain, nausea, vomiting, diarrhea, constipation, change in bowel habits, loss of appetite, hematemesis, melena, hematochezia.  GU:  dysuria, change in color of urine, urgency or frequency, flank pain, hematuria. MSK: joint pain or swelling, decreased range of motion. Psych: change in mood or affect, depression, anxiety, suicidal ideations, homicidal ideations. Skin: rash, itching, bruising.  SUBJECTIVE:  No major issues. Weaning down norepi. Lactate has normalized.  VITAL SIGNS: Temp:  [97.5 F (36.4 C)-98.7 F (37.1 C)] 98.7 F (37.1 C) (09/16 0758) Pulse Rate:  [76-93] 86 (09/16 0900) Resp:  [12-21] 13 (09/16 0900) BP: (71-113)/(38-59) 113/56 mmHg (09/16 0900) SpO2:  [97 %-100 %] 100 % (09/16 0900) Weight:  [109 lb 9.1 oz (49.7 kg)] 109 lb 9.1 oz (49.7 kg) (09/16 0416) HEMODYNAMICS:   VENTILATOR SETTINGS:   INTAKE / OUTPUT: Intake/Output      09/15 0701 - 09/16 0700 09/16 0701 - 09/17 0700   P.O. 200    I.V. (mL/kg) 1568.7 (31.6) 49 (1)   Blood 365    IV Piggyback 350    Total  Intake(mL/kg) 2483.7 (50) 49 (1)   Urine (mL/kg/hr) 2175 (1.8) 250 (1.6)   Emesis/NG output     Stool 1 (0)    Blood 100 (0.1)    Total Output 2276 250   Net +207.7 -201          PHYSICAL EXAMINATION: General: No apparent distress. Sitting in chair.  Neuro: No focal deficits. HEENT: PERRLA, moist mucus membranes Cardiovascular: S1, S2, No M/R/G.  Lungs: Clear antr Abdomen: Soft, + BS. Musculoskeletal: No gross deformities, no edema.  Skin: Intact, warm, no rashes.  LABS:  CBC  Recent Labs Lab 12/29/14 0550 12/29/14 2232 12/30/14 0519  WBC 6.2 7.5 7.0  HGB 6.2* 7.8* 7.7*  HCT 19.0* 23.9* 23.1*  PLT 35* 29* 31*   Coag's  Recent Labs Lab 12/28/14 1450 12/29/14 0550 12/29/14 1300  APTT 75* 62* 67*   BMET  Recent Labs Lab 12/28/14 0453 12/29/14 0550 12/30/14 0519  NA 137 135 137  K 3.2* 3.2* 3.6  CL 108 107 111  CO2 22 21* 19*  BUN 10 7 <5*  CREATININE 1.07* 0.86 0.76  GLUCOSE 92 83 77   Electrolytes  Recent Labs Lab 12/27/14 0244 12/28/14 0453 12/29/14 0550 12/30/14 0519  CALCIUM 7.4* 7.0* 6.6* 6.7*  MG 1.7 2.3  --   --   PHOS <1.0* 3.9  --   --    Sepsis Markers  Recent Labs Lab 12/27/14 0012 12/27/14 0245 12/27/14 0649  LATICACIDVEN 7.00* 2.2* 1.5   ABG No results for input(s): PHART, PCO2ART, PO2ART in the last 168 hours. Liver Enzymes  Recent Labs Lab 12/26/14 2354  AST 44*  ALT 20  ALKPHOS 107  BILITOT 1.3*  ALBUMIN 2.8*   Cardiac Enzymes  Recent Labs Lab 12/27/14 0244  TROPONINI 0.09*   Glucose  Recent Labs Lab 12/27/14 0232  GLUCAP 142*    Imaging Dg Chest Port 1 View  12/30/2014   CLINICAL DATA:  Respiratory failure  EXAM: PORTABLE CHEST - 1 VIEW  COMPARISON:  December 29, 2014 chest radiograph and chest CT November 09, 2014  FINDINGS: Power Port-A-Cath present with tip just beyond the cavoatrial junction. No pneumothorax. Extensive consolidation throughout the left lower lobe and lingula remains. There is a  questionable superimposed left effusion. Pulmonary nodular lesions are present, primarily on the left, better seen on CT than radiography. Heart size is within normal limits. Pulmonary vascularity is normal. No adenopathy.  IMPRESSION: Extensive consolidation throughout the lingula and left lower lobe. Suspect left effusion. Pulmonary nodular lesions consistent with  metastatic foci present but better seen on CT. There is overall no appreciable change compared to 1 day prior.   Electronically Signed   By: Lowella Grip III M.D.   On: 12/30/2014 07:55    ASSESSMENT / PLAN:  CARDIOVASCULAR R chest port >>> A:  Sepsis of unclear etiology; favor HCAP - of note, pt was hypotensive at last oncology appointment 12/22/14 was 102/67; and BP at prior 3 appointment were 91/65, 96/60, 95/51.  Also consider component of hypovolemia from decreased PO intake x 2 - [redacted] weeks along with chemotherapy induced nausea and vomiting. P:  Accept SBP > 90 with clear mental status.   INFECTIOUS A:   Sepsis of unclear etiology - favor HCAP.  Also consider component of hypovolemia from decreased PO intake x 2 - [redacted] weeks along with chemotherapy induced nausea and vomiting. P:   BCx2 9/13 > UCx 9/13 > Sputum Cx 9/13 > Abx: Vanc, start date 9/13, day 4/x. Abx: Zosyn, start date 9/13, day 4/x. PCT algorithm not useful in this immunocompromised host.  PULMONARY A: Concern for HCAP. Left pleural effusion - most likely malignant. 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 P:   Continue empiric abx as per ID section. Follow cultures. Pulmonary hygiene. Levalbuterol PRN. Outpatient follow up with oncology.  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 Pancytopenia - due to chemo. Anemia - due to chemo. P:  Consult oncology to see the patient while she is inhouse.  Would consider obtaining repeat CT scan of head, chest, abdomen, pelvis while she is hospitalized (Dr. Julien Nordmann was going to have these repeated prior to her follow up appointment in 2 weeks time). SCD's.  Hb stable today. S/p 1 unit PRBC. D/C the heparin drip as platelets are low. Continue to monitor and resume Xarelto when the platelets are better.   RENAL A:   Hyponatremia. Hypokalemia. Hypophosphatemia Mild AG + NAG acidosis - due to lactate + N / V. AKI. Pseudohypocalcemia - corrects to 8.86. P:   Replete lytes. BMP in AM.  GASTROINTESTINAL A:   Hepatic and splenic mets - due to stage IV NSCLC; currently undergoing chemo under the care of Dr. Julien Nordmann. GERD. Nutrition. P:   Famotidine. PO diet Phenergan for nausea.   ENDOCRINE A:   Hyperglycemia - no hx DM. At risk AI - on decadron PRN for nausea. P:   Check Hgb A1c. SSI if glucose consistently > 180. Cortisol is 48. No need for stress dose steroids  NEUROLOGIC A:   Confusion - improving. Bone pain - due to mets. P:   Fentanyl PRN.  Family updated: Pt and husband updated at bedside. He is upset that she is dying from cancer. I offered to get the Oncology team to see her while she is admitted to the hospital.  Interdisciplinary Family Meeting v Palliative Care Meeting:  Due by: 9/19.  Marshell Garfinkel MD [place] [facility] Pager [phone] If no answer or after 3pm call: [phone] 12/30/2014, 10:11 AM

## 2014-12-30 NOTE — Care Management Note (Signed)
Case Management Note  Patient Details  Name: TIEGAN JAMBOR MRN: [id number] Date of Birth: 1950-08-29  Subjective/Objective:                    Action/Plan:  Patient just transferred to 6 N . Spoke to patient at bedside and patient's sister via phone . Patient and sister want patient to discharge to SNF. Patient's sister works at [employer] , preference is [employer] . Explained PT just ordered today and SW would submit for authorization . Georgina Quint in PT office asked if patient could be seen today . SW Raquel Sarna aware will have weekend SW check on patient .   Explained to patient , who stated she could private pay for SNF. SW aware. Expected Discharge Date:                  Expected Discharge Plan:  [place]  In-House Referral:     Discharge planning Services  CM Consult  Post Acute Care Choice:  Durable Medical Equipment, Home Health Choice offered to:  Patient  DME Arranged:  Wheelchair manual DME Agency:  [place]:  RN, PT, OT, Nurse's Aide, Social Work [employer] Agency:  [place]  Status of Service:  In process, will continue to follow  Medicare Important Message Given:    Date Medicare IM Given:    Medicare IM give by:    Date Additional Medicare IM Given:    Additional Medicare Important Message give by:     If discussed at [place] of Stay Meetings, dates discussed:    Additional Comments:  Marilu Favre, RN 12/30/2014, 2:44 PM

## 2014-12-30 NOTE — Care Management Note (Addendum)
Case Management Note  Patient Details  Name: Sherri Stafford MRN: [id number] Date of Birth: [birth year]  Subjective/Objective:   Lives at home with her husband - independent at this point, sister in room.  Patient states would like to get a W/C on discharge.  Form on chart on what documentation needs to be present in epic.                    Action/Plan:   Expected Discharge Date:                  Expected Discharge Plan:  Home/Self Care  In-House Referral:     Discharge planning Services     Post Acute Care Choice:    Choice offered to:     DME Arranged:    DME Agency:     HH Arranged:    HH Agency:     Status of Service:  In process, will continue to follow  Medicare Important Message Given:    Date Medicare IM Given:    Medicare IM give by:    Date Additional Medicare IM Given:    Additional Medicare Important Message give by:     If discussed at [place] of Stay Meetings, dates discussed:    Additional Comments: Per nurse patient wanting to talk to case manager about assistance.  When talked with patient appears anxious, wanting to know who is going to care for her at home.  Now states she will need assistance getting to the bathroom, fixing meals for her and general caring for her needs.  From our previous conversation stated her husband would be caring for her and asked if this had changed, she states no, but she is not sure if could or how much he would do.  Continues with chemo and has no intention of stopping at this point.  Has had HH PT with [facility] in past and is agreeable to there assistance, but also informed her that as before they do visits but do not do meals or any other assisance around the house, but would assist her.  I would recommend HH RN, PT, OT, NA and SW at this time as I am not sure why the change with her husband.  Physician states he was very angry today.  Patient would also like 3-n-1.  Order for w/c has been placed and alerted [facility].  [facility] has called back  and states she will need a 14x16 which they do not carry, so will need to get from a different agency.  In process of finding someone else who carries smaller w/c and will deliver.  Will need orders for [facility] and for 3-n-1.  Luz Lex, RNBSN [phone]  Talked with patient and gave update.  Does not want smaller w/c, would rather have regular size.  Also now wants shower chair along with BSC.  Explained how the 3-n-1 works but not sure if she could do the adjustments and moves from [facility] to shower.  Will need PT's input on that one.  CM will continue to follow.   Vergie Living, RN 12/30/2014, 1:15 PM  [phone]

## 2014-12-30 NOTE — Progress Notes (Signed)
ANTICOAGULATION/ANTIBIOTIC CONSULT NOTE - Initial Consult  Pharmacy Consult for Vancomycin and Zosyn and Xarelto Indication: sepsis (likely source PNA) and h/o PE  Allergies  Allergen Reactions  . Other     Opiates cause severe constipation. (to the point of being hospitalized)  . Ketoconazole Hives  . Lorazepam Hives    Broke out in hives at [facility]   . Morphine And Related Other (See Comments)    "Constipation to point of being hospitalized"  . Nystatin Hives  . Sporanox [Itraconazole] Hives  . Tylenol [Acetaminophen] Other (See Comments)    Rosanna Randy Syndrome   . Vitamin D Other (See Comments)    Gilbert's syndrome, elevated liver enzymes  . Zofran [Ondansetron Hcl] Hives    Hives at [facility]     Patient Measurements: Height: '5\' 3"'$  (160 cm) Weight: 109 lb 9.1 oz (49.7 kg) IBW/kg (Calculated) : 52.4 Heparin Dosing Weight: 46 kg  Vital Signs: Temp: 98.7 F (37.1 C) (09/16 0758) Temp Source: Oral (09/16 0758) BP: 93/60 mmHg (09/16 1025) Pulse Rate: 92 (09/16 1025)  Labs:  Recent Labs  12/28/14 0453  12/28/14 1450 12/29/14 0550  12/29/14 1300 12/29/14 2231 12/29/14 2232 12/30/14 0519  HGB 7.0*  --   --  6.2*  --   --   --  7.8* 7.7*  HCT 21.6*  --   --  19.0*  --   --   --  23.9* 23.1*  PLT 51*  --   --  35*  --   --   --  29* 31*  APTT  --   < > 75* 62*  --  67*  --   --   --   HEPARINUNFRC  --   < > 0.11*  --   < > <0.10* <0.10*  --  0.18*  CREATININE 1.07*  --   --  0.86  --   --   --   --  0.76  < > = values in this interval not displayed.  Estimated Creatinine Clearance: 57.9 mL/min (by C-G formula based on Cr of 0.76). No results for input(s): VANCOTROUGH, VANCOPEAK, VANCORANDOM, GENTTROUGH, GENTPEAK, GENTRANDOM, TOBRATROUGH, TOBRAPEAK, TOBRARND, AMIKACINPEAK, AMIKACINTROU, AMIKACIN in the last 72 hours.    Medical History: Past Medical History  Diagnosis Date  . Arthritis   . Bilateral ovarian cysts   . GERD (gastroesophageal reflux disease)   . Strain  of hip flexor 06/2013    left side torn  . Radiation     at [facility] to left hip, sacrum and brain  . Radiation 02/10/14-03/06/14    left central chest 35 gray  . Encounter for antineoplastic chemotherapy 09/19/2014  . Cancer     lung ca  . Bone metastases     to left hip and spine  . Radiation 10/12/14-10/26/14    pelvis region 20 gray    Medications:  See electronic med rec  Assessment: 64 yo female with Stage IV lung cancer. On xarelto PTA for hx of PE. Pt was transitioned to heparin gtt but now all anticoaulation will be held given low platelets. Hgb dropped to 6.2 yesterday, back up to 7.7 after 1 unit prbc. Myelosuppression likely due to chemotherapy.  Infectious Disease: Vanc/Zosyn day #4 initially for sepsis, source thought to be possibly pneumonia. All cultures are ngtd. Renal fx stable, eCrCl 50-55 ml/min.   Goal of Therapy:  Vancomycin trough level 15-20 mcg/ml  Will monitor platelets according to anticoagulation protocol  Plan:  Zosyn 3.375gm IV q8h  Vancomycin  $'750mg'j$  IV q24h Will f/u micro data, renal function, and pt's clinical condition Vanc trough prn Stop heparin Resume Xarelto as soon as platelets increase F/u duration of abx   Hughes Better, PharmD, BCPS Clinical Pharmacist Pager: [phone] 12/30/2014 1:51 PM

## 2014-12-30 NOTE — Care Management Note (Signed)
Case Management Note  Patient Details  Name: Sherri Stafford MRN: [id number] Date of Birth: 1950/11/24  Subjective/Objective:                    Action/Plan:   Expected Discharge Date:                  Expected Discharge Plan:  [place]  In-House Referral:     Discharge planning Services  CM Consult  Post Acute Care Choice:  Durable Medical Equipment, Home Health Choice offered to:  Patient  DME Arranged:  Wheelchair manual DME Agency:  [place]:  RN, PT, OT, Nurse's Aide, Social Work [employer] Agency:  [place]  Status of Service:  In process, will continue to follow  Medicare Important Message Given:    Date Medicare IM Given:    Medicare IM give by:    Date Additional Medicare IM Given:    Additional Medicare Important Message give by:     If discussed at [place] of Stay Meetings, dates discussed:    Additional Comments: Home Health orders placed.  Left message for [facility] for regular sized w/c.  Will still need 3-n-1 or shower chair and BSC ordered but will defer to PT input Vergie Living, RN 12/30/2014, 1:41 PM

## 2014-12-30 NOTE — Consult Note (Signed)
[place]  [phone]   Patient Care Team: [name], MD as PCP - General (Family Medicine) Lynden Ang, MD as Attending Physician (Radiology) Paralee Cancel, MD as Consulting Physician (Orthopedic Surgery)  HOSPITAL CONSULT  NOTE  HPI: Sherri Stafford is a 64 year old woman with a history of  Stage 4 lung cancer as described below, admitted on 9/12 with sepsis. She was last seen at the [facility] by Dr. Julien Nordmann on 9/8 for  Day 8, cycle 2 of chemo with single agent gemcitabine. On admission, she was confused, had decreased oral intake and was very weak. She had some chills prior to admission, without any fever. She did not have any worsening dyspnea. According to husband, she did not have any seizures, headaches, or urine or bowel incontinence. She had some nausea due to recent chemo. She was found to be hypotensive.  CXR showed left pleural effusion with left lung atelectasis versus pnenumonia. Cultures were drawn. She was started on IV antibiotics, and given supportive respiratory therapy with good response. Her confusion is improving. No staging CTs were performed to date. Oncology was requested to see the patient while in hospital. Husband request to talk to Oncologist. Dr. Julien Nordmann is out of office, but Dr. Marin Stafford to see today.   DIAGNOSIS: stage IV (T2a, N3, M1b) non-small cell lung cancer, adenocarcinoma presented with large left lower lobe lung mass in addition to bilateral pulmonary nodules and mediastinal and supraclavicular lymphadenopathy diagnosed in May of 2015.  Genomic Alterations Identified? ERBB3 amplification CDK4 amplification IDH2 R172S KRAS G12D MDM2 amplification RBM10 G447f*36 TERC amplification - equivocal? Additional Disease-relevant Genes with No Reportable Alterations Identified? RET ALK BRAF ERBB2 MET EGFR  PRIOR THERAPY:  1) status post palliative radiotherapy to the left [facility]12 [facility]  status post stereotactic [facility] [place] [place] [place] 2) Systemic chemotherapy with carboplatin for AUC of 5, Alimta 500 mg/M2 and Avastin 15 mg/KG every 3 weeks, status post 5 cycles. [facility] 3) immunotherapy with Nivolmab at 3 mg/kg given every 3 weeks. Status post 16 cycles discontinued today secondary to disease progression. 4) Gilotrif 40 mg by mouth daily for a patient with positive ERBB3 amplification. Therapy starting 08/29/2014. The dose was reduced to 30 mg every other day secondary to interval recurrence. Status post approximately 2 month of therapy. Discontinued today secondary to disease progression.  CURRENT THERAPY:  1) systemic chemotherapy with single agent gemcitabine 1000 MG/M2 on days 1 and 8 every 3 weeks status post 1 cycle and she is currently undergoing cycle #2. 2) Xgeva 120 mcg subcutaneously every 2 months.  MEDICATIONS: Scheduled Meds: . sodium chloride   Intravenous Once  . [START ON 12/31/2014] famotidine  20 mg Oral Daily  . piperacillin-tazobactam (ZOSYN)  IV  3.375 g Intravenous 3 times per day  . senna-docusate  1 tablet Oral BID  . vancomycin  750 mg Intravenous Q24H   Continuous Infusions:  PRN Meds:.sodium chloride, fentaNYL (SUBLIMAZE) injection, levalbuterol, promethazine, sodium chloride ALLERGIES:   Allergies  Allergen Reactions  . Other     Opiates cause severe constipation. (to the point of being hospitalized)  . Ketoconazole Hives  . Lorazepam Hives    [facility]  . Morphine And Related Other (  See Comments)    "Constipation to point of being hospitalized"  . Nystatin Hives  . Sporanox [Itraconazole] Hives  . Tylenol [Acetaminophen] Other (See Comments)    Rosanna Randy Syndrome   . Vitamin D Other (See Comments)      Gilbert's syndrome, elevated liver enzymes  . Zofran [Ondansetron Hcl] Hives    Hives at [facility]      PHYSICAL EXAMINATION:  Filed Vitals:   12/30/14 1025  BP: 93/60  Pulse: 92  Temp:   Resp: 14   Filed Weights   12/28/14 0445 12/29/14 0500 12/30/14 0416  Weight: 111 lb 8.8 oz (50.6 kg) 108 lb 0.4 oz (49 kg) 109 lb 9.1 oz (49.7 kg)    GENERAL:alert, no distress and comfortable SKIN: skin color, texture, turgor are normal, no rashes or significant lesions EYES: normal, conjunctiva are pink and non-injected, sclera clear OROPHARYNX:no exudate, no erythema and lips, buccal mucosa, and tongue normal  NECK: supple, thyroid normal size, non-tender, without nodularity LYMPH:  no palpable lymphadenopathy in the cervical, axillary or inguinal LUNGS: clear to auscultation and percussion with normal breathing effort HEART: regular rate & rhythm and no murmurs and no lower extremity edema ABDOMEN: soft, non-tender and normal bowel sounds Musculoskeletal:no cyanosis of digits and no clubbing  PSYCH: alert & oriented x 3 with fluent speech NEURO: no focal motor/sensory deficits   LABORATORY/RADIOLOGY DATA:   Recent Labs Lab 12/26/14 2354 12/27/14 0244 12/28/14 0453 12/29/14 0550 12/29/14 2232 12/30/14 0519  WBC 3.0* 4.9 8.3 6.2 7.5 7.0  HGB 8.1* 7.7* 7.0* 6.2* 7.8* 7.7*  HCT 24.7* 23.7* 21.6* 19.0* 23.9* 23.1*  PLT 105* 100* 51* 35* 29* 31*  MCV 84.0 83.2 82.8 82.6 84.2 84.3  MCH 27.6 27.0 26.8 27.0 27.5 28.1  MCHC 32.8 32.5 32.4 32.6 32.6 33.3  RDW 20.5* 20.5* 21.0* 21.4* 20.0* 20.3*  LYMPHSABS 0.1*  --   --   --   --   --   MONOABS 0.1  --   --   --   --   --   EOSABS 0.1  --   --   --   --   --   BASOSABS 0.0  --   --   --   --   --     CMP    Recent Labs Lab 12/26/14 2354 12/27/14 0244 12/28/14 0453 12/29/14 0550 12/30/14 0519  NA 134* 133* 137 135 137  K 3.1* 2.8* 3.2* 3.2* 3.6  CL 105 105 108 107 111  CO2 16* 21* 22 21* 19*  GLUCOSE 175* 148* 92 83 77   BUN '14 15 10 7 ' <5*  CREATININE 1.22* 1.20* 1.07* 0.86 0.76  CALCIUM 7.9* 7.4* 7.0* 6.6* 6.7*  MG  --  1.7 2.3  --   --   AST 44*  --   --   --   --   ALT 20  --   --   --   --   ALKPHOS 107  --   --   --   --   BILITOT 1.3*  --   --   --   --         Component Value Date/Time   BILITOT 1.3* 12/26/2014 2354   BILITOT 0.99 12/22/2014 1043        Component Value Date/Time   ESRSEDRATE 31* 08/10/2013 1548     Urinalysis    Component Value Date/Time   COLORURINE YELLOW 12/27/2014 0014   APPEARANCEUR CLEAR 12/27/2014 0014  LABSPEC 1.009 12/27/2014 0014   LABSPEC 1.020 01/26/2014 1417   PHURINE 7.0 12/27/2014 0014   PHURINE 6.5 01/26/2014 1417   GLUCOSEU NEGATIVE 12/27/2014 0014   GLUCOSEU Negative 01/26/2014 1417   HGBUR MODERATE* 12/27/2014 0014   HGBUR Negative 01/26/2014 1417   BILIRUBINUR NEGATIVE 12/27/2014 0014   BILIRUBINUR Negative 01/26/2014 1417   BILIRUBINUR neg 07/12/2013 0948   KETONESUR NEGATIVE 12/27/2014 0014   KETONESUR 15 01/26/2014 1417   PROTEINUR 100* 12/27/2014 0014   PROTEINUR 100 01/26/2014 1417   PROTEINUR neg 07/12/2013 0948   UROBILINOGEN 1.0 12/27/2014 0014   UROBILINOGEN 0.2 01/26/2014 1417   UROBILINOGEN negative 07/12/2013 0948   NITRITE NEGATIVE 12/27/2014 0014   NITRITE Negative 01/26/2014 1417   NITRITE neg 07/12/2013 0948   LEUKOCYTESUR NEGATIVE 12/27/2014 0014   LEUKOCYTESUR Negative 01/26/2014 1417    Drugs of Abuse  No results found for: LABOPIA, COCAINSCRNUR, LABBENZ, AMPHETMU, THCU, LABBARB   Liver Function Tests:  Recent Labs Lab 12/26/14 2354  AST 44*  ALT 20  ALKPHOS 107  BILITOT 1.3*  PROT 5.0*  ALBUMIN 2.8*   No results for input(s): LIPASE, AMYLASE in the last 168 hours. No results for input(s): AMMONIA in the last 168 hours.  CBG:  Recent Labs Lab 12/27/14 0232  GLUCAP 142*     Radiology Studies:  Dg Chest Port 1 View  12/30/2014   CLINICAL DATA:  Respiratory failure  EXAM: PORTABLE CHEST  - 1 VIEW  COMPARISON:  December 29, 2014 chest radiograph and chest CT November 09, 2014  FINDINGS: Power Port-A-Cath present with tip just beyond the cavoatrial junction. No pneumothorax. Extensive consolidation throughout the left lower lobe and lingula remains. There is a questionable superimposed left effusion. Pulmonary nodular lesions are present, primarily on the left, better seen on CT than radiography. Heart size is within normal limits. Pulmonary vascularity is normal. No adenopathy.  IMPRESSION: Extensive consolidation throughout the lingula and left lower lobe. Suspect left effusion. Pulmonary nodular lesions consistent with metastatic foci present but better seen on CT. There is overall no appreciable change compared to 1 day prior.   Electronically Signed   By: Lowella Grip III M.D.   On: 12/30/2014 07:55   Dg Chest Port 1 View  12/29/2014   CLINICAL DATA:  Respiratory failure.  EXAM: PORTABLE CHEST - 1 VIEW  COMPARISON:  12/28/2014.  CT 11/08/2014.  FINDINGS: Power port catheter noted in stable position. Heart size stable. Persistent left mid lung and left lower lobe infiltrate and pleural effusion. Left lower lobe mass and pulmonary metastatic nodules better demonstrated by prior recent CT. No pneumothorax. No acute bony abnormality .  IMPRESSION: 1. Power port in stable position. 2. Persistent left mid lung and left lower lobe infiltrate and left pleural effusion. No interim improvement. 3. Left lower lobe pulmonary mass and bilateral metastatic pulmonary nodules better demonstrated by recent CT.   Electronically Signed   By: Marcello Moores  Register   On: 12/29/2014 07:22   Dg Chest Port 1 View  12/28/2014   CLINICAL DATA:  Acute respiratory failure.  Shortness of breath.  EXAM: PORTABLE CHEST - 1 VIEW  COMPARISON:  04/26/2014.  FINDINGS: Cardiopericardial silhouette appears unchanged. RIGHT IJ power port is also unchanged. LEFT lower lobe collapse/ consider set LEFT basilar collapse/ consolidation  remains present, without interval change. There is a LEFT pleural effusion. Pulmonary nodules are suboptimally visualized due to portable technique.  IMPRESSION:  No interval change with LEFT pleural effusion and LEFT mid and lower  lung collapse/consolidation.   Electronically Signed   By: Dereck Ligas M.D.   On: 12/28/2014 07:01   Dg Chest Portable 1 View  12/27/2014   CLINICAL DATA:  64 year old female with sepsis  EXAM: PORTABLE CHEST - 1 VIEW  COMPARISON:  CT dated 11/09/2014  FINDINGS: Right pectoral Port-A-Cath with tip at the cavoatrial junction. There is a large area of opacity involving the left lower lung Chevalier compatible with combination of pleural effusion and associated compressive atelectasis/ pneumonia. The right lung is clear. There is no pneumothorax with the cardiac silhouette is within normal limits. The osseous structures are grossly unremarkable.  IMPRESSION: Left-sided pleural effusion with left lung base atelectasis/pneumonia. Clinical correlation and follow-up recommended.   Electronically Signed   By: Anner Crete M.D.   On: 12/27/2014 00:21       ASSESSMENT AND PLAN:   stage IV (T2a, N3, M1b) non-small cell lung cancer, adenocarcinoma  Currently s/p C2 Gemzar Agree with staging CTs while in hospital  Confusion, improved In the setting of sepsis Cultures pending Would recommend CT head to rule out any central involvement  Sepsis Of unknown etiology, likely HCAP Cultures pending Continue IV antibiotics and supportive therapy  History of PE On Xarelto  Anemia Thrombocytopenia In the setting of recent chemo, dilution and sepsis No transfusions indicated at this time unless platelets drop to less than 10,000, or 20,000 if acutely bleeding Hb is 7.7 post 1 unit of blood Transfuse to keep Hb above 7   Full Code Husband wants to discuss his wife current oncological status. Will defer to Dr. Julien Nordmann next week. Dr. Marin Stafford to see in order to answer most  immediate questions  DVT prophylaxis On Xarelto until admission, now on hold due to low platelets On SCDs  Anemia Due to recent chemotherapy malnutrition dilution infection antibiotics acute blood loss No bleeding issues are reported No transfusion is indicated at this time Monitor counts closely Transfuse blood to maintain a Hb of 8 g or if the patient is acutely bleeding   Other medical issues as per admitting team   [facility], PA-C 12/30/2014, 1:05 PM  ADDENDUM:  I saw and examined the patient.  I think the main issue is supportive care during her chemotherapy. Her boyfriend says that she gets very dehydrated. He really wants her to have IV fluids.  I really don't see any problems with given her IV fluids in the office.  I also think that her nutritional support is important. I think she had been on Marinol. I will go ahead and restart that.  It is apparent that her prognosis just will not be all that great. She's been through multiple lines of therapy. His very few options that could be considered outside of experimental.  She actually looks better than I thought she would look. She has responded well to supportive measures while inpatient in the ICU.  I suspect that she probably is going to need to get back on blood thinner. Despite the thrombocytopenia, she is clearly hypercoagulable.  We will follow along and help out any way that we can.  I very much appreciate the great care that she is already gotten by all the staff at [facility].

## 2014-12-30 NOTE — Evaluation (Signed)
Physical Therapy Evaluation Patient Details Name: Sherri Stafford MRN: [id number] DOB: 16-Jan-1951 Today's Date: 12/30/2014   History of Present Illness  Sherri Stafford is a 64 year old woman with a history of Stage 4 lung cancer, admitted on 9/12 with sepsis.  Clinical Impression  Pt admitted with the above complication. Pt currently with functional limitations due to the deficits listed below (see PT Problem List). PTA patient ambulating household distances with a rollator, but was becoming progressively weak since Monday 9/12. Today fatigues with minimal exertion, only tolerating 12 feet of ambulation. Questionable support at home from husband who was aggressive towards staff during this evaluation. Pt will benefit from skilled PT to increase their independence and safety with mobility to allow discharge to the venue listed below.       Follow Up Recommendations SNF    Equipment Recommendations  None recommended by PT    Recommendations for Other Services       Precautions / Restrictions Precautions Precautions: Fall Restrictions Weight Bearing Restrictions: No      Mobility  Bed Mobility               General bed mobility comments: In chair  Transfers Overall transfer level: Needs assistance Equipment used: Rolling walker (2 wheeled) Transfers: Sit to/from Stand Sit to Stand: Min assist         General transfer comment: Min assist for boost to stand. VC for hand placement on stable surface to rise prior to reaching for RW.  Ambulation/Gait Ambulation/Gait assistance: Min guard Ambulation Distance (Feet): 12 Feet Assistive device: Rolling walker (2 wheeled) Gait Pattern/deviations: Step-through pattern;Decreased stride length;Trunk flexed Gait velocity: slow Gait velocity interpretation: <1.8 ft/sec, indicative of risk for recurrent falls General Gait Details: Very small steps, slow but stable with use of a rolling walker for support. Close guard for assistance. Able  to step backwards several feet without loss of balance. Reports feeling fatigued and required to sit.  Stairs            Wheelchair Mobility    Modified Rankin (Stroke Patients Only)       Balance Overall balance assessment: Needs assistance Sitting-balance support: No upper extremity supported;Feet supported Sitting balance-Leahy Scale: Good     Standing balance support: Bilateral upper extremity supported Standing balance-Leahy Scale: Poor                               Pertinent Vitals/Pain Pain Assessment: No/denies pain    Home Living Family/patient expects to be discharged to:: Skilled nursing facility Living Arrangements: Spouse/significant other Available Help at Discharge: Family (questionable assistance) Type of Home: House Home Access: Level entry     Home Layout: One level Home Equipment: Walker - 4 wheels;Bedside commode;Wheelchair - manual      Prior Function Level of Independence: Needs assistance   Gait / Transfers Assistance Needed: Using rollator PTA but became very weak beginning monday and has needed assistance with mobility since then.  ADL's / Homemaking Assistance Needed: Needs assist with ADLs        Hand Dominance   Dominant Hand: Right    Extremity/Trunk Assessment   Upper Extremity Assessment: Defer to OT evaluation           Lower Extremity Assessment: Generalized weakness         Communication   Communication: No difficulties  Cognition Arousal/Alertness: Awake/alert Behavior During Therapy: WFL for tasks assessed/performed Overall Cognitive Status: Within  Functional Limits for tasks assessed                      General Comments General comments (skin integrity, edema, etc.): Husband present, aggressive towards staff, angered that physical thearpy was consulted. Patient requests PT input because she is unsure of level of assist pt can provide at home.    Exercises         Assessment/Plan    PT Assessment Patient needs continued PT services  PT Diagnosis Difficulty walking;Abnormality of gait;Generalized weakness   PT Problem List Decreased strength;Decreased range of motion;Decreased activity tolerance;Decreased balance;Decreased mobility;Decreased knowledge of use of DME  PT Treatment Interventions DME instruction;Gait training;Functional mobility training;Therapeutic activities;Therapeutic exercise;Balance training;Neuromuscular re-education;Patient/family education   PT Goals (Current goals can be found in the Care Plan section) Acute Rehab PT Goals Patient Stated Goal: Pt unsure PT Goal Formulation: With patient Time For Goal Achievement: 01/13/15 Potential to Achieve Goals: Fair    Frequency Min 3X/week   Barriers to discharge Decreased caregiver support Questionable assistance at home. Pt does not feel husband can assist her adequately.    Co-evaluation               End of Session   Activity Tolerance: Patient limited by fatigue Patient left: in chair;with call bell/phone within reach;with family/visitor present Nurse Communication: Mobility status;Other (comment) (Husband in room, aggressive towards staff)         Time: [phone] PT Time Calculation (min) (ACUTE ONLY): 15 min   Charges:   PT Evaluation $Initial PT Evaluation Tier I: 1 Procedure     PT G CodesEllouise Newer 12/30/2014, 4:32 PM Camille Bal Gross, [place]

## 2014-12-31 DIAGNOSIS — A408 Other streptococcal sepsis: Secondary | ICD-10-CM

## 2014-12-31 LAB — MAGNESIUM: Magnesium: 1.6 mg/dL — ABNORMAL LOW (ref 1.7–2.4)

## 2014-12-31 LAB — PHOSPHORUS: Phosphorus: 2 mg/dL — ABNORMAL LOW (ref 2.5–4.6)

## 2014-12-31 LAB — BASIC METABOLIC PANEL
ANION GAP: 5 (ref 5–15)
CALCIUM: 7 mg/dL — AB (ref 8.9–10.3)
CO2: 22 mmol/L (ref 22–32)
CREATININE: 0.8 mg/dL (ref 0.44–1.00)
Chloride: 110 mmol/L (ref 101–111)
GLUCOSE: 96 mg/dL (ref 65–99)
POTASSIUM: 3.3 mmol/L — AB (ref 3.5–5.1)
SODIUM: 137 mmol/L (ref 135–145)

## 2014-12-31 LAB — CBC
HEMATOCRIT: 24 % — AB (ref 36.0–46.0)
HEMOGLOBIN: 7.9 g/dL — AB (ref 12.0–15.0)
MCH: 27.8 pg (ref 26.0–34.0)
MCHC: 32.9 g/dL (ref 30.0–36.0)
MCV: 84.5 fL (ref 78.0–100.0)
Platelets: 32 10*3/uL — ABNORMAL LOW (ref 150–400)
RBC: 2.84 MIL/uL — AB (ref 3.87–5.11)
RDW: 20.6 % — ABNORMAL HIGH (ref 11.5–15.5)
WBC: 8.2 10*3/uL (ref 4.0–10.5)

## 2014-12-31 MED ORDER — NAPROXEN 250 MG PO TABS
500.0000 mg | ORAL_TABLET | Freq: Once | ORAL | Status: AC
  Administered 2014-12-31: 500 mg via ORAL
  Filled 2014-12-31: qty 2

## 2014-12-31 MED ORDER — DOXYCYCLINE HYCLATE 100 MG PO TABS
100.0000 mg | ORAL_TABLET | Freq: Two times a day (BID) | ORAL | Status: DC
  Administered 2014-12-31 – 2015-01-03 (×7): 100 mg via ORAL
  Filled 2014-12-31 (×7): qty 1

## 2014-12-31 MED ORDER — SODIUM CHLORIDE 0.9 % IV SOLN
INTRAVENOUS | Status: DC
  Administered 2014-12-31 – 2015-01-01 (×2): via INTRAVENOUS
  Administered 2015-01-02: 1 mL via INTRAVENOUS

## 2014-12-31 MED ORDER — POTASSIUM CHLORIDE CRYS ER 20 MEQ PO TBCR
20.0000 meq | EXTENDED_RELEASE_TABLET | Freq: Two times a day (BID) | ORAL | Status: DC
  Administered 2014-12-31 – 2015-01-01 (×2): 20 meq via ORAL
  Filled 2014-12-31 (×2): qty 1

## 2014-12-31 MED ORDER — MAGNESIUM OXIDE 400 (241.3 MG) MG PO TABS
400.0000 mg | ORAL_TABLET | Freq: Every day | ORAL | Status: DC
  Administered 2014-12-31: 400 mg via ORAL
  Filled 2014-12-31: qty 1

## 2014-12-31 MED ORDER — HEPARIN SOD (PORK) LOCK FLUSH 100 UNIT/ML IV SOLN
500.0000 [IU] | INTRAVENOUS | Status: AC | PRN
  Administered 2014-12-31: 500 [IU]

## 2014-12-31 MED ORDER — RIVAROXABAN 20 MG PO TABS: 20.0000 mg | ORAL_TABLET | Freq: Every day | ORAL | Status: DC

## 2014-12-31 NOTE — Progress Notes (Signed)
Sherri Stafford [id number] DOB: 1951/03/27 DOA: 12/26/2014 PCP: [name], MD  Brief narrative: 64 year old female Metastatic NSCLC diagnosed 08/2013 status post palliative radiotherapy, systemic chemotherapy -previously under care of Dr. Oretha Caprice at [facility] -Status post stereotactic radiotherapy to the brain lesions Palliative radiotherapy to left hip 09/2013- -recently discontinued off of medication for this treatment after disease progression with spread to spleen and liver and in process getting restaging History of pulmonary embolism 01/2014 and placed on Lovenox Primary history sarcoid disease 2004 Reflux disease  Admitted to the hospital 12/27/58 with confusion found to be hypotensive lactic acid 7 MAXIMUM TEMPERATURE 102 and admitted by pulmonary critical care  Head MRI showing decreased size of cerebellar lesions with multiple metastases on left hip CT chest did not show any other specific issues but Hepatic metastases in the right liver lobe  Past medical history-As per Problem list Chart reviewed as below-   Consultants:  ONcology  Procedures:    Antibiotics:  Vancomyin 9/12  Zosyn 9/12   Subjective   Doing fair Denies pain Has some discomfort in the left hip Not really feeling short of breath Tolerating diet No chest pain   Objective    Interim History:   Telemetry:    Objective: Filed Vitals:   12/30/14 1830 12/30/14 2105 12/31/14 0500 12/31/14 0531  BP: 119/71 131/76  101/62  Pulse: 88 91  82  Temp: 98.5 F (36.9 C) 98.6 F (37 C)  98.8 F (37.1 C)  TempSrc: Oral Oral  Oral  Resp: '16 16  16  '$ Height:      Weight:   45.314 kg (99 lb 14.4 oz)   SpO2: 100% 100%  98%    Intake/Output Summary (Last 24 hours) at 12/31/14 1143 Last data filed at 12/31/14 1002  Gross per 24 hour  Intake   1200 ml  Output    500 ml  Net    700 ml    Exam:  General: EOMI NCAT frail Cardiovascular: S1-S2 no murmur rub or gallop Respiratory:  Clinically has diffuse rales but dullness to percussion on the left posterior aspect and decreased air entry there as well Abdomen: Soft nontender nondistended no rebound Skin no lower extremity edema Neuro intact  Data Reviewed: Basic Metabolic Panel:  Recent Labs Lab 12/27/14 0244 12/28/14 0453 12/29/14 0550 12/30/14 0519 12/31/14 0530  NA 133* 137 135 137 137  K 2.8* 3.2* 3.2* 3.6 3.3*  CL 105 108 107 111 110  CO2 21* 22 21* 19* 22  GLUCOSE 148* 92 83 77 96  BUN '15 10 7 '$ <5* <5*  CREATININE 1.20* 1.07* 0.86 0.76 0.80  CALCIUM 7.4* 7.0* 6.6* 6.7* 7.0*  MG 1.7 2.3  --   --  1.6*  PHOS <1.0* 3.9  --   --  2.0*   Liver Function Tests:  Recent Labs Lab 12/26/14 2354  AST 44*  ALT 20  ALKPHOS 107  BILITOT 1.3*  PROT 5.0*  ALBUMIN 2.8*   No results for input(s): LIPASE, AMYLASE in the last 168 hours. No results for input(s): AMMONIA in the last 168 hours. CBC:  Recent Labs Lab 12/26/14 2354  12/28/14 0453 12/29/14 0550 12/29/14 2232 12/30/14 0519 12/31/14 0530  WBC 3.0*  < > 8.3 6.2 7.5 7.0 8.2  NEUTROABS 2.7  --   --   --   --   --   --   HGB 8.1*  < > 7.0* 6.2* 7.8* 7.7* 7.9*  HCT 24.7*  < > 21.6* 19.0*  23.9* 23.1* 24.0*  MCV 84.0  < > 82.8 82.6 84.2 84.3 84.5  PLT 105*  < > 51* 35* 29* 31* 32*  < > = values in this interval not displayed. Cardiac Enzymes:  Recent Labs Lab 12/27/14 0244  TROPONINI 0.09*   BNP: Invalid input(s): POCBNP CBG:  Recent Labs Lab 12/27/14 0232  GLUCAP 142*    Recent Results (from the past 240 hour(s))  TECHNOLOGIST REVIEW     Status: None   Collection Time: 12/22/14 10:43 AM  Result Value Ref Range Status   Technologist Review   Final    Rare Metas and Myelocytes present, rare nRBC, moderate ovalocytes, few teardrops  Blood Culture (routine x 2)     Status: None (Preliminary result)   Collection Time: 12/26/14 11:45 PM  Result Value Ref Range Status   Specimen Description BLOOD RIGHT HAND  Final   Special  Requests BOTTLES DRAWN AEROBIC ONLY 2CC  Final   Culture NO GROWTH 3 DAYS  Final   Report Status PENDING  Incomplete  Blood Culture (routine x 2)     Status: None (Preliminary result)   Collection Time: 12/26/14 11:54 PM  Result Value Ref Range Status   Specimen Description BLOOD PICC LINE  Final   Special Requests BOTTLES DRAWN AEROBIC ONLY 5CC  Final   Culture NO GROWTH 3 DAYS  Final   Report Status PENDING  Incomplete  Urine culture     Status: None   Collection Time: 12/27/14 12:14 AM  Result Value Ref Range Status   Specimen Description URINE, CATHETERIZED  Final   Special Requests NONE  Final   Culture NO GROWTH 1 DAY  Final   Report Status 12/28/2014 FINAL  Final  MRSA PCR Screening     Status: None   Collection Time: 12/27/14  2:25 AM  Result Value Ref Range Status   MRSA by PCR NEGATIVE NEGATIVE Final    Comment:        The GeneXpert MRSA Assay (FDA approved for NASAL specimens only), is one component of a comprehensive MRSA colonization surveillance program. It is not intended to diagnose MRSA infection nor to guide or monitor treatment for MRSA infections.      Studies:              All Imaging reviewed and is as per above notation   Scheduled Meds: . sodium chloride   Intravenous Once  . dronabinol  5 mg Oral BID AC  . famotidine  20 mg Oral Daily  . piperacillin-tazobactam (ZOSYN)  IV  3.375 g Intravenous 3 times per day  . senna-docusate  1 tablet Oral BID  . vancomycin  750 mg Intravenous Q24H   Continuous Infusions:    Assessment/Plan:  Sepsis initial lactate 7 temp 102 ? HCAP consider transitioning off of vancomycin and Zosyn to oral Augmentin and 24 hours CBC plus differential a.m. 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 Earlie Server next Thursday Repeat chest x-ray 2 views  Metastatic lung cancer NSCLC Will  need further discussions about her care as an outpatient Poor prognosis and likely will need to have goals of care discussion with her oncologist once discharged  She tells me that she's been getting IV saline infusions every weekly/every 3 weekly and we will keep her on IV saline  thrombocytopenia Note that counts have dropped from the 50 range to the 30 range since  the 14th This may be representation of her infectious state It could also be secondary to antibiotics given temporal relationship?  -There is evidence that beta lactams and vancomycin can cause this although critical illness can also cause this I will transition to doxycycline as patient appears to defervesced   we will follow counts in the morning   Pulmonary embolism 01/2014 Needs lifeline to cognition once counts come up Relative risk for DVT therefore placed on SCDs  Anemia of malignancy Monitor counts Transfusion thresholds 7   Hypernatremia and metabolic disturbances are resolved Magnesium is low so we will replace with mag ox orally    Appt with PCP: Requested Code Status: Full code Family Communication: discussed with  family + Disposition Plan: Likely place in snf DVT prophylaxis: SCD Consultants:   Verneita Griffes, MD  [facility] Pager [phone] 12/31/2014, 11:43 AM    LOS: 4 days

## 2015-01-01 ENCOUNTER — Inpatient Hospital Stay (HOSPITAL_COMMUNITY): Payer: BC Managed Care – PPO

## 2015-01-01 DIAGNOSIS — A021 Salmonella sepsis: Secondary | ICD-10-CM

## 2015-01-01 LAB — CULTURE, BLOOD (ROUTINE X 2)
Culture: NO GROWTH
Culture: NO GROWTH

## 2015-01-01 LAB — CBC WITH DIFFERENTIAL/PLATELET
BASOS ABS: 0 10*3/uL (ref 0.0–0.1)
Basophils Relative: 0 %
EOS ABS: 0.2 10*3/uL (ref 0.0–0.7)
Eosinophils Relative: 2 %
HCT: 24.6 % — ABNORMAL LOW (ref 36.0–46.0)
Hemoglobin: 7.9 g/dL — ABNORMAL LOW (ref 12.0–15.0)
LYMPHS ABS: 0.7 10*3/uL (ref 0.7–4.0)
LYMPHS PCT: 9 %
MCH: 27.6 pg (ref 26.0–34.0)
MCHC: 32.1 g/dL (ref 30.0–36.0)
MCV: 86 fL (ref 78.0–100.0)
Monocytes Absolute: 1.1 10*3/uL — ABNORMAL HIGH (ref 0.1–1.0)
Monocytes Relative: 14 %
NEUTROS ABS: 6.2 10*3/uL (ref 1.7–7.7)
Neutrophils Relative %: 75 %
Platelets: 51 10*3/uL — ABNORMAL LOW (ref 150–400)
RBC: 2.86 MIL/uL — ABNORMAL LOW (ref 3.87–5.11)
RDW: 21.4 % — AB (ref 11.5–15.5)
WBC: 8.2 10*3/uL (ref 4.0–10.5)

## 2015-01-01 MED ORDER — FENTANYL 50 MCG/HR TD PT72
50.0000 ug | MEDICATED_PATCH | TRANSDERMAL | Status: DC
  Administered 2015-01-01: 50 ug via TRANSDERMAL
  Filled 2015-01-01: qty 1

## 2015-01-01 MED ORDER — TRAMADOL HCL 50 MG PO TABS
50.0000 mg | ORAL_TABLET | Freq: Three times a day (TID) | ORAL | Status: DC | PRN
Start: 2015-01-01 — End: 2015-01-03
  Administered 2015-01-01 – 2015-01-03 (×4): 50 mg via ORAL
  Filled 2015-01-01 (×4): qty 1

## 2015-01-01 NOTE — Progress Notes (Signed)
Sherri Stafford [id number] DOB: 24-Jan-1951 DOA: 12/26/2014 PCP: [name], MD  Brief narrative: 64 year old female Metastatic NSCLC diagnosed 08/2013 status post palliative radiotherapy, systemic chemotherapy -previously under care of Dr. Oretha Caprice at [facility] -Status post stereotactic radiotherapy to the brain lesions Palliative radiotherapy to left hip 09/2013- -recently discontinued off of medication for this treatment after disease progression with spread to spleen and liver and in process getting restaging History of pulmonary embolism 01/2014 and placed on Lovenox Primary history sarcoid disease 2004 Reflux disease  Admitted to the hospital 12/27/58 with confusion found to be hypotensive lactic acid 7 MAXIMUM TEMPERATURE 102 and admitted by pulmonary critical care  Head MRI showing decreased size of cerebellar lesions with multiple metastases on left hip CT chest did not show any other specific issues but Hepatic metastases in the right liver lobe  Past medical history-As per Problem list Chart reviewed as below-   Consultants:  ONcology  Procedures:    Antibiotics:  Vancomyin 9/12 9/17  Zosyn 9/12 9/17  Doxycycline 9/70   Subjective    doing fair. Asking "am I getting better" Some mild nausea today Hip pain noted in the left hip No fever no chills   Objective    Interim History:   Telemetry:    Objective: Filed Vitals:   12/31/14 1422 12/31/14 2126 01/01/15 0500 01/01/15 0543  BP: 91/52 114/62  107/53  Pulse: 90 90  95  Temp: 97.9 F (36.6 C) 99.3 F (37.4 C)  98.4 F (36.9 C)  TempSrc: Oral Oral  Oral  Resp: '16 18  19  '$ Height:      Weight:   45.3 kg (99 lb 13.9 oz)   SpO2: 98% 100%  100%    Intake/Output Summary (Last 24 hours) at 01/01/15 1129 Last data filed at 01/01/15 0648  Gross per 24 hour  Intake 1508.34 ml  Output      0 ml  Net 1508.34 ml    Exam:  General: EOMI NCAT frail Cardiovascular: S1-S2 no murmur rub or  gallop Respiratory: Clinically has diffuse rales but dullness to percussion on the left posterior aspect and decreased air entry there as well Abdomen: Soft nontender nondistended no rebound Skin no lower extremity edema  Data Reviewed: Basic Metabolic Panel:  Recent Labs Lab 12/27/14 0244 12/28/14 0453 12/29/14 0550 12/30/14 0519 12/31/14 0530  NA 133* 137 135 137 137  K 2.8* 3.2* 3.2* 3.6 3.3*  CL 105 108 107 111 110  CO2 21* 22 21* 19* 22  GLUCOSE 148* 92 83 77 96  BUN '15 10 7 '$ <5* <5*  CREATININE 1.20* 1.07* 0.86 0.76 0.80  CALCIUM 7.4* 7.0* 6.6* 6.7* 7.0*  MG 1.7 2.3  --   --  1.6*  PHOS <1.0* 3.9  --   --  2.0*   Liver Function Tests:  Recent Labs Lab 12/26/14 2354  AST 44*  ALT 20  ALKPHOS 107  BILITOT 1.3*  PROT 5.0*  ALBUMIN 2.8*   No results for input(s): LIPASE, AMYLASE in the last 168 hours. No results for input(s): AMMONIA in the last 168 hours. CBC:  Recent Labs Lab 12/26/14 2354  12/29/14 0550 12/29/14 2232 12/30/14 0519 12/31/14 0530 01/01/15 0704  WBC 3.0*  < > 6.2 7.5 7.0 8.2 8.2  NEUTROABS 2.7  --   --   --   --   --  6.2  HGB 8.1*  < > 6.2* 7.8* 7.7* 7.9* 7.9*  HCT 24.7*  < > 19.0* 23.9* 23.1*  24.0* 24.6*  MCV 84.0  < > 82.6 84.2 84.3 84.5 86.0  PLT 105*  < > 35* 29* 31* 32* 51*  < > = values in this interval not displayed. Cardiac Enzymes:  Recent Labs Lab 12/27/14 0244  TROPONINI 0.09*   BNP: Invalid input(s): POCBNP CBG:  Recent Labs Lab 12/27/14 0232  GLUCAP 142*    Recent Results (from the past 240 hour(s))  Blood Culture (routine x 2)     Status: None (Preliminary result)   Collection Time: 12/26/14 11:45 PM  Result Value Ref Range Status   Specimen Description BLOOD RIGHT HAND  Final   Special Requests BOTTLES DRAWN AEROBIC ONLY 2CC  Final   Culture NO GROWTH 4 DAYS  Final   Report Status PENDING  Incomplete  Blood Culture (routine x 2)     Status: None (Preliminary result)   Collection Time: 12/26/14 11:54  PM  Result Value Ref Range Status   Specimen Description BLOOD PICC LINE  Final   Special Requests BOTTLES DRAWN AEROBIC ONLY 5CC  Final   Culture NO GROWTH 4 DAYS  Final   Report Status PENDING  Incomplete  Urine culture     Status: None   Collection Time: 12/27/14 12:14 AM  Result Value Ref Range Status   Specimen Description URINE, CATHETERIZED  Final   Special Requests NONE  Final   Culture NO GROWTH 1 DAY  Final   Report Status 12/28/2014 FINAL  Final  MRSA PCR Screening     Status: None   Collection Time: 12/27/14  2:25 AM  Result Value Ref Range Status   MRSA by PCR NEGATIVE NEGATIVE Final    Comment:        The GeneXpert MRSA Assay (FDA approved for NASAL specimens only), is one component of a comprehensive MRSA colonization surveillance program. It is not intended to diagnose MRSA infection nor to guide or monitor treatment for MRSA infections.      Studies:              All Imaging reviewed and is as per above notation   Scheduled Meds: . sodium chloride   Intravenous Once  . doxycycline  100 mg Oral Q12H  . dronabinol  5 mg Oral BID AC  . famotidine  20 mg Oral Daily  . magnesium oxide  400 mg Oral Daily  . potassium chloride  20 mEq Oral BID  . senna-docusate  1 tablet Oral BID   Continuous Infusions: . sodium chloride 50 mL/hr at 12/31/14 1944     Assessment/Plan:  Sepsis initial lactate 7 temp 102 ? HCAP consider transitioning off of vancomycin and Zosyn to oral Augmentin and 24 hours CBC plus differential a.m. 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 Earlie Server next Thursday Repeat chest x-ray 2 views as above  Metastatic lung cancer NSCLC Will need further discussions about her care as an outpatient Poor  prognosis and likely will need to have goals of care discussion with her oncologist once discharged  She tells me that she's been getting IV saline infusions every weekly/every 3 weekly and we will keep her on IV saline 50 cc an hour until discharge  thrombocytopenia Note that counts have dropped from the 50 range to the 30 range since the 14th This may be representation  of her infectious state Once  discontinued antibiotics her counts have rebounded -May be able to restart Elliquis once counts are above 100?  Pulmonary embolism 01/2014 Needs lifeline to cognition once counts come up Relative risk for DVT therefore placed on SCDs  Anemia of malignancy Monitor counts Transfusion thresholds 7  Hypernatremia and metabolic disturbances are resolved Magnesium was low on 9/17 so we will recheck on 9/18 with labs  Appt with PCP: Requested Code Status: Full code Family Communication: No family + on 9/18 Disposition Plan: Likely place in snf DVT prophylaxis: SCD Consultants:   Verneita Griffes, MD  [facility] Pager [phone] 01/01/2015, 11:29 AM    LOS: 5 days

## 2015-01-02 ENCOUNTER — Encounter (HOSPITAL_COMMUNITY): Payer: Self-pay | Admitting: Radiology

## 2015-01-02 ENCOUNTER — Inpatient Hospital Stay (HOSPITAL_COMMUNITY): Payer: BC Managed Care – PPO

## 2015-01-02 DIAGNOSIS — C3432 Malignant neoplasm of lower lobe, left bronchus or lung: Secondary | ICD-10-CM

## 2015-01-02 DIAGNOSIS — D696 Thrombocytopenia, unspecified: Secondary | ICD-10-CM

## 2015-01-02 DIAGNOSIS — C7889 Secondary malignant neoplasm of other digestive organs: Secondary | ICD-10-CM

## 2015-01-02 DIAGNOSIS — J90 Pleural effusion, not elsewhere classified: Secondary | ICD-10-CM

## 2015-01-02 DIAGNOSIS — A4151 Sepsis due to Escherichia coli [E. coli]: Secondary | ICD-10-CM

## 2015-01-02 DIAGNOSIS — E860 Dehydration: Secondary | ICD-10-CM

## 2015-01-02 DIAGNOSIS — R410 Disorientation, unspecified: Secondary | ICD-10-CM

## 2015-01-02 DIAGNOSIS — D649 Anemia, unspecified: Secondary | ICD-10-CM

## 2015-01-02 DIAGNOSIS — A419 Sepsis, unspecified organism: Principal | ICD-10-CM

## 2015-01-02 DIAGNOSIS — C7931 Secondary malignant neoplasm of brain: Secondary | ICD-10-CM

## 2015-01-02 DIAGNOSIS — J189 Pneumonia, unspecified organism: Secondary | ICD-10-CM

## 2015-01-02 LAB — MAGNESIUM: Magnesium: 1.6 mg/dL — ABNORMAL LOW (ref 1.7–2.4)

## 2015-01-02 LAB — CBC
HEMATOCRIT: 24.8 % — AB (ref 36.0–46.0)
HEMOGLOBIN: 8 g/dL — AB (ref 12.0–15.0)
MCH: 28.1 pg (ref 26.0–34.0)
MCHC: 32.3 g/dL (ref 30.0–36.0)
MCV: 87 fL (ref 78.0–100.0)
Platelets: 89 10*3/uL — ABNORMAL LOW (ref 150–400)
RBC: 2.85 MIL/uL — AB (ref 3.87–5.11)
RDW: 22.1 % — ABNORMAL HIGH (ref 11.5–15.5)
WBC: 19.2 10*3/uL — ABNORMAL HIGH (ref 4.0–10.5)

## 2015-01-02 MED ORDER — MAGNESIUM OXIDE 400 (241.3 MG) MG PO TABS
400.0000 mg | ORAL_TABLET | Freq: Two times a day (BID) | ORAL | Status: DC
  Administered 2015-01-02 – 2015-01-03 (×3): 400 mg via ORAL
  Filled 2015-01-02 (×3): qty 1

## 2015-01-02 MED ORDER — RIVAROXABAN 20 MG PO TABS
20.0000 mg | ORAL_TABLET | Freq: Every day | ORAL | Status: DC
  Administered 2015-01-02 – 2015-01-03 (×2): 20 mg via ORAL
  Filled 2015-01-02 (×2): qty 1

## 2015-01-02 MED ORDER — IOHEXOL 300 MG/ML  SOLN
100.0000 mL | Freq: Once | INTRAMUSCULAR | Status: AC | PRN
  Administered 2015-01-02: 100 mL via INTRAVENOUS

## 2015-01-02 MED ORDER — IOHEXOL 300 MG/ML  SOLN
25.0000 mL | INTRAMUSCULAR | Status: AC
  Administered 2015-01-02: 25 mL via ORAL

## 2015-01-02 MED ORDER — SODIUM CHLORIDE 0.9 % IV BOLUS (SEPSIS)
500.0000 mL | Freq: Once | INTRAVENOUS | Status: AC
  Administered 2015-01-02: 500 mL via INTRAVENOUS

## 2015-01-02 NOTE — Clinical Social Work Placement (Signed)
   CLINICAL SOCIAL WORK PLACEMENT  NOTE  Date:  01/02/2015  Patient Details  Name: JUNELLA DOMKE MRN: [id number] Date of Birth: 10-12-1950  Clinical Social Work is seeking post-discharge placement for this patient at the [place] level of care (*CSW will initial, date and re-position this form in  chart as items are completed):  Yes   Patient/family provided with [place] Work Department's list of facilities offering this level of care within the geographic area requested by the patient (or if unable, by the patient's family).  Yes   Patient/family informed of their freedom to choose among providers that offer the needed level of care, that participate in Medicare, Medicaid or managed care program needed by the patient, have an available bed and are willing to accept the patient.  Yes   Patient/family informed of [facility]'s ownership interest in [facility] and [facility], as well as of the fact that they are under no obligation to receive care at these facilities.  PASRR submitted to EDS on 01/02/15     PASRR number received on 01/02/15     Existing PASRR number confirmed on  (n/a)     FL2 transmitted to all facilities in geographic area requested by pt/family on 01/02/15     FL2 transmitted to all facilities within larger geographic area on  (n/a)     Patient informed that his/her managed care company has contracts with or will negotiate with certain facilities, including the following:   (yes, [facility])     Yes   Patient/family informed of bed offers received.  Patient chooses bed at [facility] at [place] recommends and patient chooses bed at  (n/a)    Patient to be transferred to [facility] [place] on  .  Patient to be transferred to facility by [facility]     Patient family notified on   of transfer.  Name of family member notified:  Tandy Gaw     PHYSICIAN Please sign FL2     Additional Comment:     _______________________________________________ Caroline Sauger, LCSW 01/02/2015, 3:27 PM
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
## 2015-01-02 NOTE — Progress Notes (Signed)
RN received a call from CT that patient had an allergy to contrast per MD order. RN paged MD, per MD he does not know what allergies that could be. RN told to ask patient. Per patient she has no allergies to contrast. CT made aware.
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
## 2015-01-02 NOTE — Progress Notes (Signed)
Sherri Stafford:[id number] DOB: Sep 02, 1950 DOA: 12/26/2014 PCP: [name], MD  Brief narrative: 64 year old female Metastatic NSCLC diagnosed 08/2013 status post palliative radiotherapy, systemic chemotherapy -previously under care of Dr. Oretha Caprice at [facility] -Status post stereotactic radiotherapy to the brain lesions Palliative radiotherapy to left hip 09/2013- -recently discontinued off of medication for this treatment after disease progression with spread to spleen and liver and in process getting restaging History of pulmonary embolism 01/2014 and placed on Lovenox Primary history sarcoid disease 2004 Reflux disease  Admitted to the hospital 12/27/58 with confusion found to be hypotensive lactic acid 7 MAXIMUM TEMPERATURE 102 and admitted by pulmonary critical care  Head MRI showing decreased size of cerebellar lesions with multiple metastases on left hip CT chest did not show any other specific issues but Hepatic metastases in the right liver lobe  Past medical history-As per Problem list Chart reviewed as below-   Consultants:  ONcology  Procedures:    Antibiotics:  Vancomyin 9/12 9/17  Zosyn 9/12 9/17  Doxycycline 9/17   Subjective   Fair tol diet poorly Couple of episodes of vomit No cp No sob No blurred or double vision "i feel tired"    Objective    Interim History:   Telemetry:    Objective: Filed Vitals:   01/01/15 0543 01/01/15 1436 01/01/15 2158 01/02/15 0529  BP: 107/53 110/61 128/58 101/45  Pulse: 95 86 96 94  Temp: 98.4 F (36.9 C) 97.7 F (36.5 C) 98 F (36.7 C) 98.1 F (36.7 C)  TempSrc: Oral Oral Oral Oral  Resp: '19 19 21 19  '$ Height:      Weight:    45.3 kg (99 lb 13.9 oz)  SpO2: 100% 100% 100% 100%    Intake/Output Summary (Last 24 hours) at 01/02/15 1258 Last data filed at 01/02/15 0600  Gross per 24 hour  Intake   1880 ml  Output      0 ml  Net   1880 ml    Exam:  General: EOMI NCAT frail Cardiovascular:  S1-S2 no murmur rub or gallop Respiratory: Clinically has diffuse rales but dullness to percussion on the left posterior aspect and decreased air entry there as well Abdomen: Soft nontender nondistended no rebound Skin no lower extremity edema  Data Reviewed: Basic Metabolic Panel:  Recent Labs Lab 12/27/14 0244 12/28/14 0453 12/29/14 0550 12/30/14 0519 12/31/14 0530 01/02/15 0518  NA 133* 137 135 137 137  --   K 2.8* 3.2* 3.2* 3.6 3.3*  --   CL 105 108 107 111 110  --   CO2 21* 22 21* 19* 22  --   GLUCOSE 148* 92 83 77 96  --   BUN '15 10 7 '$ <5* <5*  --   CREATININE 1.20* 1.07* 0.86 0.76 0.80  --   CALCIUM 7.4* 7.0* 6.6* 6.7* 7.0*  --   MG 1.7 2.3  --   --  1.6* 1.6*  PHOS <1.0* 3.9  --   --  2.0*  --    Liver Function Tests:  Recent Labs Lab 12/26/14 2354  AST 44*  ALT 20  ALKPHOS 107  BILITOT 1.3*  PROT 5.0*  ALBUMIN 2.8*   No results for input(s): LIPASE, AMYLASE in the last 168 hours. No results for input(s): AMMONIA in the last 168 hours. CBC:  Recent Labs Lab 12/26/14 2354  12/29/14 2232 12/30/14 0519 12/31/14 0530 01/01/15 0704 01/02/15 0518  WBC 3.0*  < > 7.5 7.0 8.2 8.2 19.2*  NEUTROABS 2.7  --   --   --   --  6.2  --   HGB 8.1*  < > 7.8* 7.7* 7.9* 7.9* 8.0*  HCT 24.7*  < > 23.9* 23.1* 24.0* 24.6* 24.8*  MCV 84.0  < > 84.2 84.3 84.5 86.0 87.0  PLT 105*  < > 29* 31* 32* 51* 89*  < > = values in this interval not displayed. Cardiac Enzymes:  Recent Labs Lab 12/27/14 0244  TROPONINI 0.09*   BNP: Invalid input(s): POCBNP CBG:  Recent Labs Lab 12/27/14 0232  GLUCAP 142*    Recent Results (from the past 240 hour(s))  Blood Culture (routine x 2)     Status: None   Collection Time: 12/26/14 11:45 PM  Result Value Ref Range Status   Specimen Description BLOOD RIGHT HAND  Final   Special Requests BOTTLES DRAWN AEROBIC ONLY 2CC  Final   Culture NO GROWTH 5 DAYS  Final   Report Status 01/01/2015 FINAL  Final  Blood Culture (routine x 2)      Status: None   Collection Time: 12/26/14 11:54 PM  Result Value Ref Range Status   Specimen Description BLOOD PICC LINE  Final   Special Requests BOTTLES DRAWN AEROBIC ONLY 5CC  Final   Culture NO GROWTH 5 DAYS  Final   Report Status 01/01/2015 FINAL  Final  Urine culture     Status: None   Collection Time: 12/27/14 12:14 AM  Result Value Ref Range Status   Specimen Description URINE, CATHETERIZED  Final   Special Requests NONE  Final   Culture NO GROWTH 1 DAY  Final   Report Status 12/28/2014 FINAL  Final  MRSA PCR Screening     Status: None   Collection Time: 12/27/14  2:25 AM  Result Value Ref Range Status   MRSA by PCR NEGATIVE NEGATIVE Final    Comment:        The GeneXpert MRSA Assay (FDA approved for NASAL specimens only), is one component of a comprehensive MRSA colonization surveillance program. It is not intended to diagnose MRSA infection nor to guide or monitor treatment for MRSA infections.      Studies:              All Imaging reviewed and is as per above notation   Scheduled Meds: . sodium chloride   Intravenous Once  . doxycycline  100 mg Oral Q12H  . dronabinol  5 mg Oral BID AC  . famotidine  20 mg Oral Daily  . fentaNYL  50 mcg Transdermal Q72H  . magnesium oxide  400 mg Oral BID  . potassium chloride  20 mEq Oral BID  . senna-docusate  1 tablet Oral BID   Continuous Infusions:     Assessment/Plan:  Sepsis initial lactate 7 temp 102 ? HCAP transitioned off of vancomycin and Zosyn to oral doxy 9/18 Monitor for 1 more day CBC plus differential a.m. 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 Earlie Server next Thursday CT abd/chest pelvis ordered at Dr. Earlie Server request  for staging  Metastatic lung cancer NSCLC Will need further discussions about her  care as an outpatient Poor prognosis and likely will need to have goals of care discussion with her oncologist once discharged  She tells me that she's been getting IV saline infusions every weekly/every 3 weekly Saline locked 9/18 as fluid in lungs  thrombocytopenia Note that counts have dropped  from the 50 range to the 30 range since the 14th This may be representation of her infectious state Once  discontinued antibiotics her counts have rebounded -Restarted Xarelto 9/19 as PLT count above 50,000  Pulmonary embolism 01/2014 Needs lifeline [facility]  Anemia of malignancy Monitor counts Transfusion thresholds 7   Hypernatremia and metabolic disturbances are resolved Magnesium was low on 9/17 so we will recheck on 9/18 with labs Replacing aggressively with Magnesium oxide 400 bid  Appt with PCP: Requested Code Status: Full code Family Communication:d/w family bedside who understand Disposition Plan: Likely place in snf DVT prophylaxis: SCD Consultants:   Verneita Griffes, MD  [facility] Pager [phone] 01/02/2015, 12:58 PM    LOS: 6 days

## 2015-01-02 NOTE — Progress Notes (Signed)
Sherri Stafford   DOB:07-28-1950   WL#:[id number]   RDE#:[id number]  Subjective: Events since 9/16 noted. Feeling better from prior day, but still tired. Denies fevers, chills, night sweats, vision changes, or mucositis. Denies shortness of breath or cough. Denies any chest pain or palpitations. Denies lower extremity swelling. Reports some nausea. Appetite is normal. Denies any dysuria. Denies abnormal skin rashes, or neuropathy. Denies any bleeding issues such as epistaxis, hematemesis, hematuria or hematochezia. He continues to have left hip discomfort, controlled with meds.   Scheduled Meds: . sodium chloride   Intravenous Once  . doxycycline  100 mg Oral Q12H  . dronabinol  5 mg Oral BID AC  . famotidine  20 mg Oral Daily  . fentaNYL  50 mcg Transdermal Q72H  . magnesium oxide  400 mg Oral BID  . potassium chloride  20 mEq Oral BID  . senna-docusate  1 tablet Oral BID   Continuous Infusions:  PRN Meds:.levalbuterol, promethazine, sodium chloride, traMADol  Objective:  Filed Vitals:   01/02/15 0529  BP: 101/45  Pulse: 94  Temp: 98.1 F (36.7 C)  Resp: 19    Body mass index is 17.7 kg/(m^2).  Intake/Output Summary (Last 24 hours) at 01/02/15 1104 Last data filed at 01/02/15 0600  Gross per 24 hour  Intake   1880 ml  Output      0 ml  Net   1880 ml     Sclerae unicteric  Oropharynx clear  No peripheral adenopathy  Lungs with bibasilar rales, with decreased left base sounds, no rhonchi or wheezing.   Heart regular rate and rhythm  Abdomen benign  MSK no focal spinal tenderness, no peripheral edema  Neuro nonfocal      Labs:   Recent Labs Lab 12/26/14 2354  12/29/14 2232 12/30/14 0519 12/31/14 0530 01/01/15 0704 01/02/15 0518  WBC 3.0*  < > 7.5 7.0 8.2 8.2 19.2*  HGB 8.1*  < > 7.8* 7.7* 7.9* 7.9* 8.0*  HCT 24.7*  < > 23.9* 23.1* 24.0* 24.6* 24.8*  PLT 105*  < > 29* 31* 32* 51* 89*  MCV 84.0  < > 84.2 84.3 84.5 86.0 87.0  MCH 27.6  < > 27.5 28.1 27.8 27.6 28.1   MCHC 32.8  < > 32.6 33.3 32.9 32.1 32.3  RDW 20.5*  < > 20.0* 20.3* 20.6* 21.4* 22.1*  LYMPHSABS 0.1*  --   --   --   --  0.7  --   MONOABS 0.1  --   --   --   --  1.1*  --   EOSABS 0.1  --   --   --   --  0.2  --   BASOSABS 0.0  --   --   --   --  0.0  --   < > = values in this interval not displayed.   Chemistries:    Recent Labs Lab 12/26/14 2354 12/27/14 0244 12/28/14 0453 12/29/14 0550 12/30/14 0519 12/31/14 0530 01/02/15 0518  NA 134* 133* 137 135 137 137  --   K 3.1* 2.8* 3.2* 3.2* 3.6 3.3*  --   CL 105 105 108 107 111 110  --   CO2 16* 21* 22 21* 19* 22  --   GLUCOSE 175* 148* 92 83 77 96  --   BUN '14 15 10 7 '$ <5* <5*  --   CREATININE 1.22* 1.20* 1.07* 0.86 0.76 0.80  --   CALCIUM 7.9* 7.4* 7.0* 6.6* 6.7* 7.0*  --  MG  --  1.7 2.3  --   --  1.6* 1.6*  AST 44*  --   --   --   --   --   --   ALT 20  --   --   --   --   --   --   ALKPHOS 107  --   --   --   --   --   --   BILITOT 1.3*  --   --   --   --   --   --    Liver Function Tests:  Recent Labs Lab 12/26/14 2354  AST 44*  ALT 20  ALKPHOS 107  BILITOT 1.3*  PROT 5.0*  ALBUMIN 2.8*    Microbiology Cultures negative to date.   Studies:  Dg Chest 2 View  01/01/2015   CLINICAL DATA:  Pneumonia, former smoker, current history of left lung cancer  EXAM: CHEST  2 VIEW  COMPARISON:  12/30/2014  FINDINGS: Trace pleural effusion on the right. Right Port-A-Cath in unchanged position. Mild cardiac enlargement stable.  Large left pleural effusion with extensive underlying consolidation. When compared to prior study this is not significantly different.  IMPRESSION: Stable large left pleural effusion with underlying consolidation.   Electronically Signed   By: Skipper Cliche M.D.   On: 01/01/2015 14:22     Assessment / Plan:   stage IV (T2a, N3, M1b) non-small cell lung cancer, adenocarcinoma  Likely malignant pleural effusion  s/p C2 Gemzar, recently discontinued off of medication for this treatment after  disease progression with spread to spleen and liver  cxr on 9/18 shows stable large pleural effusion. No thoracentesis is indicated at this time   Confusion, improved In the setting of sepsis Head MRI showing decreased size of cerebellar lesions with multiple metastases. She also has known right lobe liver lesion These findings may contribute to the mental status changes.  Sepsis, improving Of unknown etiology, likely HCAP Cultures negative to date. Continue IV antibiotics and supportive therapy  History of PE On SCDs, due to thrombocytopenia  Anemia Thrombocytopenia In the setting of recent chemo, dilution and sepsis No transfusions indicated at this time unless platelets drop to less than 10,000, or 20,000 if acutely bleeding Hb was 7.7 post 1 unit of blood, currently at 8 Transfuse to keep Hb above 7  Thrombocytopenia In the setting of sepsis, dilution, antibiotics and malignancy May restart Eliquis if platelets rise to 100k, unless bleeding issues occur  DVT prophylaxis On Xarelto until admission, now on hold due to low platelets On SCDs   Full Code Husband wants to discuss his wife current oncological status. Will defer to Dr. Julien Nordmann.   Sherri Stafford 62/19/2016  11:04 AM Medical Oncology and Hematology [facility]  Hematology/Oncology Attending: The patient is seen and examined today. I agree with the above note. This is a very pleasant 64 years old white female with a stage IV non-small cell lung cancer status post several chemotherapy regimens and currently undergoing treatment with single agent gemcitabine status post 2 cycles. The patient was admitted to [facility] with generalized weakness and fatigue, dehydration and chills. Chest x-ray on the day of admission showed left-sided pleural effusion with left lung base atelectasis/pneumonia. The patient was started on IV anti-Patrick's and currently on doxycycline. She is feeling a  little bit better. She received IV hydration during her hospitalization. She also received 1 unit of PRBCs transfusion. She is expected to be  discharged to a skilled nursing facility. I recommended for the patient to have repeat CT scan of the chest, abdomen and pelvis for restaging of her disease before discharge. She is scheduled to see me in the clinic and few days for discussion of her scan results and further recommendation regarding treatment of her condition. Her platelets count had improved to 89,000. The patient can resume her anticoagulation. The patient agreed to the current plan. Thank you so much for taking good care of Ms. fields, I will continue to follow up the patient with you and assist in her management an as-needed basis.  Disclaimer: This note was dictated with voice recognition software. Similar sounding words can inadvertently be transcribed and may be missed upon review.

## 2015-01-02 NOTE — Progress Notes (Signed)
Physical Therapy Treatment Patient Details Name: Sherri Stafford MRN: [id number] DOB: 25-Sep-1950 Today's Date: 01/02/2015    History of Present Illness [name] is a 64 year old woman with a history of Stage 4 lung cancer, admitted on 9/12 with sepsis.    PT Comments    Patient progressing slowly towards PT goals. Pt is self limiting with ambulation distance and mobility secondary to pain and fatigue. Reports nausea. Improved ambulation distance from prior session with encouragement. Plans to d/c to [facility] SNF today. Will follow acutely per current POC.   Follow Up Recommendations  SNF     Equipment Recommendations  None recommended by PT    Recommendations for Other Services       Precautions / Restrictions Precautions Precautions: Fall Restrictions Weight Bearing Restrictions: No    Mobility  Bed Mobility Overal bed mobility: Needs Assistance Bed Mobility: Supine to Sit     Supine to sit: Min assist;HOB elevated     General bed mobility comments: Pt reaching for therapist's hand to assist pulling up to EOB. + emesis prior to getting to EOB. Rn notified and present in room.  Transfers Overall transfer level: Needs assistance Equipment used: Rolling walker (2 wheeled) Transfers: Sit to/from Stand Sit to Stand: Min assist         General transfer comment: Min assist for boost to stand. VC for hand placement on stable surface to rise prior to reaching for RW. Stood from [employer], from toilet x1.   Ambulation/Gait Ambulation/Gait assistance: Min guard Ambulation Distance (Feet): 20 Feet (+32') Assistive device: Rolling walker (2 wheeled) Gait Pattern/deviations: Step-through pattern;Decreased stride length;Trunk flexed Gait velocity: slow Gait velocity interpretation: <1.8 ft/sec, indicative of risk for recurrent falls General Gait Details: Slow, unsteady gait. Fatigued. Cues for RW management. 1 seated rest break.    Stairs            Wheelchair Mobility     Modified Rankin (Stroke Patients Only)       Balance Overall balance assessment: Needs assistance Sitting-balance support: Feet supported;No upper extremity supported Sitting balance-Leahy Scale: Good     Standing balance support: During functional activity Standing balance-Leahy Scale: Poor                      Cognition Arousal/Alertness: Awake/alert Behavior During Therapy: WFL for tasks assessed/performed Overall Cognitive Status: Within Functional Limits for tasks assessed                      Exercises      General Comments        Pertinent Vitals/Pain Pain Assessment: Faces Faces Pain Scale: Hurts little more Pain Location: side Pain Descriptors / Indicators: Sore Pain Intervention(s): Monitored during session;Repositioned    Home Living                      Prior Function            PT Goals (current goals can now be found in the care plan section) Progress towards PT goals: Progressing toward goals (slowly)    Frequency  Min 3X/week    PT Plan Current plan remains appropriate    Co-evaluation             End of Session Equipment Utilized During Treatment: Gait belt Activity Tolerance: Patient limited by fatigue Patient left: in chair;with call bell/phone within reach     Time: 0951-1016 PT Time Calculation (min) (ACUTE  ONLY): 25 min  Charges:  $Gait Training: 8-22 mins $Therapeutic Activity: 8-22 mins                    G Codes:      [place] 01/02/2015, 10:47 AM Wray Kearns, PT, DPT [phone]

## 2015-01-03 LAB — CBC
HCT: 23.7 % — ABNORMAL LOW (ref 36.0–46.0)
HEMOGLOBIN: 7.5 g/dL — AB (ref 12.0–15.0)
MCH: 27.5 pg (ref 26.0–34.0)
MCHC: 31.6 g/dL (ref 30.0–36.0)
MCV: 86.8 fL (ref 78.0–100.0)
PLATELETS: 149 10*3/uL — AB (ref 150–400)
RBC: 2.73 MIL/uL — AB (ref 3.87–5.11)
RDW: 22.6 % — ABNORMAL HIGH (ref 11.5–15.5)
WBC: 11.2 10*3/uL — AB (ref 4.0–10.5)

## 2015-01-03 LAB — COMPREHENSIVE METABOLIC PANEL
ALT: 15 U/L (ref 14–54)
AST: 18 U/L (ref 15–41)
Albumin: 2.1 g/dL — ABNORMAL LOW (ref 3.5–5.0)
Alkaline Phosphatase: 72 U/L (ref 38–126)
Anion gap: 9 (ref 5–15)
BUN: 5 mg/dL — AB (ref 6–20)
CHLORIDE: 107 mmol/L (ref 101–111)
CO2: 19 mmol/L — AB (ref 22–32)
CREATININE: 0.86 mg/dL (ref 0.44–1.00)
Calcium: 6.5 mg/dL — ABNORMAL LOW (ref 8.9–10.3)
GFR calc Af Amer: >60 mL/min (ref >60)
GFR calc non Af Amer: >60 mL/min (ref >60)
Glucose, Bld: 69 mg/dL (ref 65–99)
Potassium: 3.1 mmol/L — ABNORMAL LOW (ref 3.5–5.1)
SODIUM: 135 mmol/L (ref 135–145)
Total Bilirubin: 1.5 mg/dL — ABNORMAL HIGH (ref 0.3–1.2)
Total Protein: 4.4 g/dL — ABNORMAL LOW (ref 6.5–8.1)

## 2015-01-03 MED ORDER — DOXYCYCLINE HYCLATE 100 MG PO TABS: 100.0000 mg | ORAL_TABLET | Freq: Two times a day (BID) | ORAL | Status: AC

## 2015-01-03 MED ORDER — FENTANYL 50 MCG/HR TD PT72: 50.0000 ug | MEDICATED_PATCH | TRANSDERMAL | Status: AC

## 2015-01-03 MED ORDER — POTASSIUM CHLORIDE CRYS ER 20 MEQ PO TBCR: 20.0000 meq | EXTENDED_RELEASE_TABLET | Freq: Two times a day (BID) | ORAL | Status: AC

## 2015-01-03 MED ORDER — SODIUM CHLORIDE 0.9 % IV SOLN: 1000.0000 mL | INTRAVENOUS | Status: AC

## 2015-01-03 MED ORDER — DRONABINOL 2.5 MG PO CAPS: 2.5000 mg | ORAL_CAPSULE | Freq: Two times a day (BID) | ORAL | Status: AC

## 2015-01-03 MED ORDER — TRAMADOL HCL 50 MG PO TABS
50.0000 mg | ORAL_TABLET | Freq: Four times a day (QID) | ORAL | Status: AC | PRN
Start: 2015-01-03 — End: ?

## 2015-01-03 MED ORDER — MAGNESIUM OXIDE 400 (241.3 MG) MG PO TABS: 400.0000 mg | ORAL_TABLET | Freq: Two times a day (BID) | ORAL | Status: AC

## 2015-01-03 MED ORDER — HEPARIN SOD (PORK) LOCK FLUSH 100 UNIT/ML IV SOLN
500.0000 [IU] | INTRAVENOUS | Status: AC | PRN
  Administered 2015-01-03: 500 [IU]

## 2015-01-03 NOTE — Discharge Summary (Signed)
Physician Discharge Summary  Sherri Stafford KZS:[id number] DOB: 1950/11/21 DOA: 12/26/2014  PCP: [name], MD  Admit date: 12/26/2014 Discharge date: 01/03/2015  Time spent: 35 minutes  Recommendations for Outpatient Follow-up:  1. Patient will need IV fluid 1000 cc 3 times a week at the nursing facility through port to prevent dehydration 2. Please provide transportation to [facility] for outpatient oncology follow-up 01/05/15 with Dr. Earlie Server who will prognosticate what further options as patient may have at that time 3. Patient is to continue blood thinner Xarelto for dual indication of pulmonary embolism secondary prophylaxis as well as new diagnosis of splenic vein thrombosis 4. Unclear if patient would benefit from further radiation therapy to lung? 5. Recommend 2 more days of oral doxycycline for presumed pneumonia of left lung although this is unlikely 6. Recommend further imaging studies to be guided by oncology 7. We've refilled all controlled substances 8. Patient to be discharged later today to [facility] facility  Discharge Diagnoses:  Active Problems:   Sepsis   Discharge Condition:  guarded with high risk for hospital readmission  Diet Recommendation: liberalize  Filed Weights   01/01/15 0500 01/02/15 0529 01/03/15 0500  Weight: 45.3 kg (99 lb 13.9 oz) 45.3 kg (99 lb 13.9 oz) 45.3 kg (99 lb 13.9 oz)    History of present illness:  64 year old female Metastatic NSCLC diagnosed 08/2013 status post palliative radiotherapy, systemic chemotherapy -previously under care of Dr. Oretha Caprice at [facility] post stereotactic radiotherapy to the brain lesions Palliative radiotherapy to left hip 09/2013- -recently discontinued off of medication for this treatment after disease progression with spread to spleen and liver and in process getting restaging History of pulmonary embolism 01/2014 and placed on Lovenox Primary history sarcoid disease 2004 Reflux  disease  Admitted to the hospital 12/27/58 with confusion found to be hypotensive lactic acid 7 MAXIMUM TEMPERATURE 102 and admitted by pulmonary critical care  Head MRI showing decreased size of cerebellar lesions with multiple metastases on left hip CT chest did not show any other specific issues but Hepatic metastases in the right liver lobe Please see below for further input   Hospital Course:    Sepsis initial lactate 7 temp 102 ? 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 Earlie Server next Thursday CT abd/chest pelvis ordered at Dr. Earlie Server request for staging  Metastatic lung cancer NSCLC Will need further discussions about her care as an outpatient Poor prognosis and likely will need to have goals of care discussion with her oncologist once discharged  She tells me that she's been getting IV saline infusions every weekly/every 3 weekly Saline locked 9/18 as fluid in lungs Staging CT scan abdomen pelvis and chest 9/19 showed progression of metastatic lesions in liver largest mass 4. 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 L3  thrombocytopenia Note that counts have dropped from the 50 range to the 30 range since the 14th This may be representation of her infectious state Once discontinued antibiotics her counts have rebounded -Restarted Xarelto 9/19 as PLT count above 50,000 -On discharge her platelet  count was 149  Pulmonary embolism 01/2014 Needs [facility]  Anemia of  malignancy Monitor counts Transfusion thresholds 7 on discharge hemoglobin was 7.5 -On discharge patient's hemoglobin was 7.5 which is around baseline that she's been at   Hypernatremia and metabolic disturbances are resolved Magnesium was low on 9/17 so we will recheck on 9/18 with labs Replacing aggressively with Magnesium oxide 400 bid    Consultations  oncology   Discharge Exam: Filed Vitals:   01/03/15 0453  BP: 98/51  Pulse: 99  Temp: 98.4 F (36.9 C)  Resp: 18    General:patient looks and feels about the same without any further change Cardiovascular:  S1-S2 no murmur rub or gallop no shortness of breath no nausea no vomiting Respiratory:  Clinically clear no added sound but decreased air entry on the left side with increasing fremitus and no rales no rhonchi   Discharge Instructions   Discharge Instructions    Diet - low sodium heart healthy    Complete by:  As directed      Discharge instructions    Complete by:  As directed      Increase activity slowly    Complete by:  As directed           Current Discharge Medication List    START taking these medications   Details  doxycycline (VIBRA-TABS) 100 MG tablet Take 1 tablet (100 mg total) by mouth every 12 (twelve) hours. Qty: 4 tablet, Refills: 0    magnesium oxide (MAG-OX) 400 (241.3 MG) MG tablet Take 1 tablet (400 mg total) by mouth 2 (two) times daily. Qty: 40 tablet, Refills: 0    potassium chloride SA (K-DUR,KLOR-CON) 20 MEQ tablet Take 1 tablet (20 mEq total) by mouth 2 (two) times daily. Qty: 60 tablet, Refills: 0    sodium chloride 0.9 % infusion Inject 1,000 mLs into the vein 3 (three) times a week. Qty: 1000 mL, Refills: 0      CONTINUE these medications which have CHANGED   Details  dronabinol (MARINOL) 2.5 MG capsule Take 1 capsule (2.5 mg total) by mouth 2 (two) times daily before a meal. Qty: 60 capsule, Refills: 0   Associated Diagnoses: Adenocarcinoma of lung, unspecified  laterality; Bone metastases; Liver metastases; Anorexia    fentaNYL (DURAGESIC - DOSED MCG/HR) 50 MCG/HR Place 1 patch (50 mcg total) onto the skin every 3 (three) days. Qty: 10 patch, Refills: 0   Associated Diagnoses: Adenocarcinoma of lung, unspecified laterality    traMADol (ULTRAM) 50 MG tablet Take 1 tablet (50 mg total) by mouth every 6 (six) hours as needed. Qty: 60 tablet, Refills: 0   Associated Diagnoses: Adenocarcinoma of lung, unspecified laterality      CONTINUE these medications which have NOT CHANGED   Details  dexamethasone (DECADRON) 4 MG tablet May take 1/2 to 1 tab PO BID PRN nausea. Qty: 30 tablet, Refills: 1   Associated Diagnoses: Adenocarcinoma of lung, unspecified laterality    OLANZapine zydis (ZYPREXA ZYDIS) 10 MG disintegrating tablet For nausea Qty: 30 tablet, Refills: 0   Associated Diagnoses: Nausea and vomiting, vomiting of unspecified type    rivaroxaban (XARELTO) 20 MG TABS tablet Take 1 tablet (20 mg total) by mouth daily with supper. Qty: 30 tablet, Refills: 5    albuterol (PROVENTIL HFA;VENTOLIN HFA) 108 (90 BASE) MCG/ACT inhaler Inhale 1-2 puffs into the lungs every 6 (six) hours as needed for wheezing or shortness of breath. Qty: 1 Inhaler, Refills: 2   Associated Diagnoses: Acute  bronchitis, unspecified organism    bisacodyl (DULCOLAX) 10 MG suppository Place 1 suppository (10 mg total) rectally daily as needed for moderate constipation. Qty: 10 suppository, Refills: 0   Associated Diagnoses: Adenocarcinoma of lung, unspecified laterality    clindamycin (CLEOCIN-T) 1 % external solution Apply topically 2 (two) times daily. Qty: 30 mL, Refills: 0    diphenoxylate-atropine (LOMOTIL) 2.5-0.025 MG per tablet Take 2 tablets by mouth 4 (four) times daily as needed for diarrhea or loose stools. Qty: 30 tablet, Refills: 0   Associated Diagnoses: Adenocarcinoma of lung, unspecified laterality    prochlorperazine (COMPAZINE) 10 MG tablet Take 1  tablet (10 mg total) by mouth every 6 (six) hours as needed for nausea or vomiting. Qty: 45 tablet, Refills: 2   Associated Diagnoses: Nausea    sucralfate (CARAFATE) 1 GM/10ML suspension Take 10 mLs (1 g total) by mouth 4 (four) times daily -  with meals and at bedtime. Qty: 420 mL, Refills: 0       Allergies  Allergen Reactions  . Other     Opiates cause severe constipation. (to the point of being hospitalized)  . Ketoconazole Hives  . Lorazepam Hives    Broke out in hives at [facility]   . Morphine And Related Other (See Comments)    "Constipation to point of being hospitalized"  . Nystatin Hives  . Sporanox [Itraconazole] Hives  . Tylenol [Acetaminophen] Other (See Comments)    Rosanna Randy Syndrome   . Vitamin D Other (See Comments)    Gilbert's syndrome, elevated liver enzymes  . Zofran [Ondansetron Hcl] Hives    Hives at [facility]       The results of significant diagnostics from this hospitalization (including imaging, microbiology, ancillary and laboratory) are listed below for reference.    Significant Diagnostic Studies: Dg Chest 2 View  01/01/2015   CLINICAL DATA:  Pneumonia, former smoker, current history of left lung cancer  EXAM: CHEST  2 VIEW  COMPARISON:  12/30/2014  FINDINGS: Trace pleural effusion on the right. Right Port-A-Cath in unchanged position. Mild cardiac enlargement stable.  Large left pleural effusion with extensive underlying consolidation. When compared to prior study this is not significantly different.  IMPRESSION: Stable large left pleural effusion with underlying consolidation.   Electronically Signed   By: Skipper Cliche M.D.   On: 01/01/2015 14:22   Ct Chest W Contrast  01/03/2015   CLINICAL DATA:  History of lung cancer diagnosed in May 2015, metastatic to bone, status post radiation therapy completed in 2015, and chemotherapy. Patient complains of nausea.  EXAM: CT CHEST, ABDOMEN, AND PELVIS WITH CONTRAST  TECHNIQUE: Multidetector CT imaging of the chest,  abdomen and pelvis was performed following the standard protocol during bolus administration of intravenous contrast.  CONTRAST:  119m OMNIPAQUE IOHEXOL 300 MG/ML  SOLN  COMPARISON:  11/09/2014  FINDINGS: CT CHEST FINDINGS  Mediastinum/Lymph Nodes: There is a right pretracheal and subcarinal lymphadenopathy. Previously noted left supraclavicular lymph node is stable to decreased in size. There is no evidence of axillary lymphadenopathy. The heart is normal in size. There is no evidence of pericardial effusion. Right-sided injectable port terminates within the right atrium.  Lungs/Pleura: The left pleural effusion has increased in size. Again seen is atelectatic left lower lobe with several hypoattenuated lesions, not significantly changed. There are bilateral pulmonary masses, consistent with metastatic disease, which are not significantly changed in size, the largest mass in the left upper lobe measures 1.5 x 1.0 cm.  CT ABDOMEN PELVIS FINDINGS  Hepatobiliary:  There is progression of metastatic disease to liver, with the largest mass in the inferior portion of the right lobe of the liver measuring 4.8 x 3.8 cm. There is interval enlargement in several masses in the dome of the liver as well some of which are now confluent.  Pancreas: No mass, inflammatory changes, or other significant abnormality. Bulky retroperitoneal adenopathy is again seen compressing the posterior border of the pancreas.  Spleen: There is worsening of the metastatic disease to the spleen as well, with confluent metastatic masses occupying most of the volume of the spleen. There is tumor thrombus extending into the splenic vein. There are geometric area of hypoattenuation within the superior and inferior aspects of the spleen, which may represent areas of hypoperfusion.  Adrenals/Urinary Tract: There is a 9 mm nodule within the lateral leaflet of the right adrenal gland.  Stomach/Bowel: No evidence of obstruction, inflammatory process, or  abnormal fluid collections.  Vascular/Lymphatic: Mild atherosclerotic disease of the aorta.  Reproductive: No mass or other significant abnormality.  Other: Dependent soft tissue edema.  Musculoskeletal: Again noted is skeletal metastatic disease within the spine and pelvis. There is progression of abnormal mixed lytic sclerotic appearance of L3 vertebral body. No evidence of compression fracture.  IMPRESSION: Interval increase of left pleural effusion, with left lower lobe collapse.  Stable pulmonary metastases.  Interval worsening of mediastinal lymphadenopathy.  Interval worsening of liver metastatic disease.  Interval worsening of splenic metastatic disease with bulky splenic vein tumor thrombosis, and secondary hypoperfusion changes within the spleen.  Stable retroperitoneal lymphadenopathy.  Stable skeletal metastatic disease. No evidence of compression fractures.  These results were called by telephone at the time of interpretation on 01/03/2015 at 7:48 am to Gearldine Bienenstock, RN, answering on behalf of Dr. Nita Sells , who verbally acknowledged these results.   Electronically Signed   By: Fidela Salisbury M.D.   On: 01/03/2015 07:58   Ct Abdomen Pelvis W Contrast  01/03/2015   CLINICAL DATA:  History of lung cancer diagnosed in May 2015, metastatic to bone, status post radiation therapy completed in 2015, and chemotherapy. Patient complains of nausea.  EXAM: CT CHEST, ABDOMEN, AND PELVIS WITH CONTRAST  TECHNIQUE: Multidetector CT imaging of the chest, abdomen and pelvis was performed following the standard protocol during bolus administration of intravenous contrast.  CONTRAST:  19m OMNIPAQUE IOHEXOL 300 MG/ML  SOLN  COMPARISON:  11/09/2014  FINDINGS: CT CHEST FINDINGS  Mediastinum/Lymph Nodes: There is a right pretracheal and subcarinal lymphadenopathy. Previously noted left supraclavicular lymph node is stable to decreased in size. There is no evidence of axillary lymphadenopathy. The heart  is normal in size. There is no evidence of pericardial effusion. Right-sided injectable port terminates within the right atrium.  Lungs/Pleura: The left pleural effusion has increased in size. Again seen is atelectatic left lower lobe with several hypoattenuated lesions, not significantly changed. There are bilateral pulmonary masses, consistent with metastatic disease, which are not significantly changed in size, the largest mass in the left upper lobe measures 1.5 x 1.0 cm.  CT ABDOMEN PELVIS FINDINGS  Hepatobiliary: There is progression of metastatic disease to liver, with the largest mass in the inferior portion of the right lobe of the liver measuring 4.8 x 3.8 cm. There is interval enlargement in several masses in the dome of the liver as well some of which are now confluent.  Pancreas: No mass, inflammatory changes, or other significant abnormality. Bulky retroperitoneal adenopathy is again seen compressing the posterior border of the pancreas.  Spleen: There is worsening of the metastatic disease to the spleen as well, with confluent metastatic masses occupying most of the volume of the spleen. There is tumor thrombus extending into the splenic vein. There are geometric area of hypoattenuation within the superior and inferior aspects of the spleen, which may represent areas of hypoperfusion.  Adrenals/Urinary Tract: There is a 9 mm nodule within the lateral leaflet of the right adrenal gland.  Stomach/Bowel: No evidence of obstruction, inflammatory process, or abnormal fluid collections.  Vascular/Lymphatic: Mild atherosclerotic disease of the aorta.  Reproductive: No mass or other significant abnormality.  Other: Dependent soft tissue edema.  Musculoskeletal: Again noted is skeletal metastatic disease within the spine and pelvis. There is progression of abnormal mixed lytic sclerotic appearance of L3 vertebral body. No evidence of compression fracture.  IMPRESSION: Interval increase of left pleural  effusion, with left lower lobe collapse.  Stable pulmonary metastases.  Interval worsening of mediastinal lymphadenopathy.  Interval worsening of liver metastatic disease.  Interval worsening of splenic metastatic disease with bulky splenic vein tumor thrombosis, and secondary hypoperfusion changes within the spleen.  Stable retroperitoneal lymphadenopathy.  Stable skeletal metastatic disease. No evidence of compression fractures.  These results were called by telephone at the time of interpretation on 01/03/2015 at 7:48 am to Gearldine Bienenstock, RN, answering on behalf of Dr. Nita Sells , who verbally acknowledged these results.   Electronically Signed   By: Fidela Salisbury M.D.   On: 01/03/2015 07:58   Dg Chest Port 1 View  12/30/2014   CLINICAL DATA:  Respiratory failure  EXAM: PORTABLE CHEST - 1 VIEW  COMPARISON:  December 29, 2014 chest radiograph and chest CT November 09, 2014  FINDINGS: Power Port-A-Cath present with tip just beyond the cavoatrial junction. No pneumothorax. Extensive consolidation throughout the left lower lobe and lingula remains. There is a questionable superimposed left effusion. Pulmonary nodular lesions are present, primarily on the left, better seen on CT than radiography. Heart size is within normal limits. Pulmonary vascularity is normal. No adenopathy.  IMPRESSION: Extensive consolidation throughout the lingula and left lower lobe. Suspect left effusion. Pulmonary nodular lesions consistent with metastatic foci present but better seen on CT. There is overall no appreciable change compared to 1 day prior.   Electronically Signed   By: Lowella Grip III M.D.   On: 12/30/2014 07:55   Dg Chest Port 1 View  12/29/2014   CLINICAL DATA:  Respiratory failure.  EXAM: PORTABLE CHEST - 1 VIEW  COMPARISON:  12/28/2014.  CT 11/08/2014.  FINDINGS: Power port catheter noted in stable position. Heart size stable. Persistent left mid lung and left lower lobe infiltrate and pleural  effusion. Left lower lobe mass and pulmonary metastatic nodules better demonstrated by prior recent CT. No pneumothorax. No acute bony abnormality .  IMPRESSION: 1. Power port in stable position. 2. Persistent left mid lung and left lower lobe infiltrate and left pleural effusion. No interim improvement. 3. Left lower lobe pulmonary mass and bilateral metastatic pulmonary nodules better demonstrated by recent CT.   Electronically Signed   By: Marcello Moores  Register   On: 12/29/2014 07:22   Dg Chest Port 1 View  12/28/2014   CLINICAL DATA:  Acute respiratory failure.  Shortness of breath.  EXAM: PORTABLE CHEST - 1 VIEW  COMPARISON:  04/26/2014.  FINDINGS: Cardiopericardial silhouette appears unchanged. RIGHT IJ power port is also unchanged. LEFT lower lobe collapse/ consider set LEFT basilar collapse/ consolidation remains present, without interval change. There is a LEFT pleural  effusion. Pulmonary nodules are suboptimally visualized due to portable technique.  IMPRESSION:  No interval change with LEFT pleural effusion and LEFT mid and lower lung collapse/consolidation.   Electronically Signed   By: Dereck Ligas M.D.   On: 12/28/2014 07:01   Dg Chest Portable 1 View  12/27/2014   CLINICAL DATA:  64 year old female with sepsis  EXAM: PORTABLE CHEST - 1 VIEW  COMPARISON:  CT dated 11/09/2014  FINDINGS: Right pectoral Port-A-Cath with tip at the cavoatrial junction. There is a large area of opacity involving the left lower lung [place] compatible with combination of pleural effusion and associated compressive atelectasis/ pneumonia. The right lung is clear. There is no pneumothorax with the cardiac silhouette is within normal limits. The osseous structures are grossly unremarkable.  IMPRESSION: Left-sided pleural effusion with left lung base atelectasis/pneumonia. Clinical correlation and follow-up recommended.   Electronically Signed   By: Anner Crete M.D.   On: 12/27/2014 00:21    Microbiology: Recent  Results (from the past 240 hour(s))  Blood Culture (routine x 2)     Status: None   Collection Time: 12/26/14 11:45 PM  Result Value Ref Range Status   Specimen Description BLOOD RIGHT HAND  Final   Special Requests BOTTLES DRAWN AEROBIC ONLY 2CC  Final   Culture NO GROWTH 5 DAYS  Final   Report Status 01/01/2015 FINAL  Final  Blood Culture (routine x 2)     Status: None   Collection Time: 12/26/14 11:54 PM  Result Value Ref Range Status   Specimen Description BLOOD PICC LINE  Final   Special Requests BOTTLES DRAWN AEROBIC ONLY 5CC  Final   Culture NO GROWTH 5 DAYS  Final   Report Status 01/01/2015 FINAL  Final  Urine culture     Status: None   Collection Time: 12/27/14 12:14 AM  Result Value Ref Range Status   Specimen Description URINE, CATHETERIZED  Final   Special Requests NONE  Final   Culture NO GROWTH 1 DAY  Final   Report Status 12/28/2014 FINAL  Final  MRSA PCR Screening     Status: None   Collection Time: 12/27/14  2:25 AM  Result Value Ref Range Status   MRSA by PCR NEGATIVE NEGATIVE Final    Comment:        The GeneXpert MRSA Assay (FDA approved for NASAL specimens only), is one component of a comprehensive MRSA colonization surveillance program. It is not intended to diagnose MRSA infection nor to guide or monitor treatment for MRSA infections.      Labs: Basic Metabolic Panel:  Recent Labs Lab 12/28/14 0453 12/29/14 0550 12/30/14 0519 12/31/14 0530 01/02/15 0518 01/03/15 0445  NA 137 135 137 137  --  135  K 3.2* 3.2* 3.6 3.3*  --  3.1*  CL 108 107 111 110  --  107  CO2 22 21* 19* 22  --  19*  GLUCOSE 92 83 77 96  --  69  BUN 10 7 <5* <5*  --  5*  CREATININE 1.07* 0.86 0.76 0.80  --  0.86  CALCIUM 7.0* 6.6* 6.7* 7.0*  --  6.5*  MG 2.3  --   --  1.6* 1.6*  --   PHOS 3.9  --   --  2.0*  --   --    Liver Function Tests:  Recent Labs Lab 01/03/15 0445  AST 18  ALT 15  ALKPHOS 72  BILITOT 1.5*  PROT 4.4*  ALBUMIN 2.1*   No  results for  input(s): LIPASE, AMYLASE in the last 168 hours. No results for input(s): AMMONIA in the last 168 hours. CBC:  Recent Labs Lab 12/30/14 0519 12/31/14 0530 01/01/15 0704 01/02/15 0518 01/03/15 0445  WBC 7.0 8.2 8.2 19.2* 11.2*  NEUTROABS  --   --  6.2  --   --   HGB 7.7* 7.9* 7.9* 8.0* 7.5*  HCT 23.1* 24.0* 24.6* 24.8* 23.7*  MCV 84.3 84.5 86.0 87.0 86.8  PLT 31* 32* 51* 89* 149*   Cardiac Enzymes: No results for input(s): CKTOTAL, CKMB, CKMBINDEX, TROPONINI in the last 168 hours. BNP: BNP (last 3 results) No results for input(s): BNP in the last 8760 hours.  ProBNP (last 3 results) No results for input(s): PROBNP in the last 8760 hours.  CBG: No results for input(s): GLUCAP in the last 168 hours.     SignedNita Sells  [facility] 01/03/2015, 9:57 AM

## 2015-01-03 NOTE — Discharge Planning (Signed)
Patient to be discharged to [facility]. Patient's sister, Alyse Low, updated regarding discharge.  Facility: [facility] RN report number: [phone] Transportation: EMS  Lubertha Sayres, [place] [phone]) and Surgical [phone])

## 2015-01-03 NOTE — Clinical Social Work Placement (Signed)
   CLINICAL SOCIAL WORK PLACEMENT  NOTE  Date:  01/03/2015  Patient Details  Name: Sherri Stafford MRN: [id number] Date of Birth: 05-01-1950  Clinical Social Work is seeking post-discharge placement for this patient at the [place] level of care (*CSW will initial, date and re-position this form in  chart as items are completed):  Yes   Patient/family provided with [facility] Work Department's list of facilities offering this level of care within the geographic area requested by the patient (or if unable, by the patient's family).  Yes   Patient/family informed of their freedom to choose among providers that offer the needed level of care, that participate in Medicare, Medicaid or managed care program needed by the patient, have an available bed and are willing to accept the patient.  Yes   Patient/family informed of [facility]'s ownership interest in [facility] and [facility], as well as of the fact that they are under no obligation to receive care at these facilities.  PASRR submitted to EDS on 01/02/15     PASRR number received on 01/02/15     Existing PASRR number confirmed on  (n/a)     FL2 transmitted to all facilities in geographic area requested by pt/family on 01/02/15     FL2 transmitted to all facilities within larger geographic area on  (n/a)     Patient informed that his/her managed care company has contracts with or will negotiate with certain facilities, including the following:   (yes, [facility])     Yes   Patient/family informed of bed offers received.  Patient chooses bed at [facility] recommends and patient chooses bed at  (n/a)    Patient to be transferred to [facility] [place] on 01/03/15.  Patient to be transferred to facility by [facility]     Patient family notified on 01/03/15 of transfer.  Name of family member notified:  [name]     PHYSICIAN       Additional Comment:     _______________________________________________ Caroline Sauger, LCSW 01/03/2015, 11:38 AM

## 2015-01-03 NOTE — Progress Notes (Signed)
Pt's BP at 01/02/2015 2146=88/50 (dynamap); rechecked manually BP=92/58; paged on call K.Schorr,NP; Ordered 551m of NS bolus; rechecked BP manually at 2346= 102/62. Will continue to monitor pt.

## 2015-01-03 NOTE — Progress Notes (Signed)
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    Nurse who received alert:  Earley Brooke   MD notified (1st page):  Samtani  Time of first page:  0800  MD notified (2nd page):  Time of second page:  Responding MD:  Verlon Au  Time MD responded:  [phone]

## 2015-01-03 NOTE — Progress Notes (Signed)
Porta cath deaccessed. Flushed with 10cc NS followed by Heparin 5ml (100u/ml). No bleeding to site, band aid to site for comfort. Timmons, Christina M 

## 2015-01-03 NOTE — Discharge Planning (Signed)
Patient discharged to SNF in stable condition.  

## 2015-01-04 ENCOUNTER — Ambulatory Visit (HOSPITAL_COMMUNITY): Payer: BC Managed Care – PPO

## 2015-01-05 ENCOUNTER — Telehealth: Payer: Self-pay | Admitting: Internal Medicine

## 2015-01-05 ENCOUNTER — Telehealth: Payer: Self-pay | Admitting: Medical Oncology

## 2015-01-05 ENCOUNTER — Ambulatory Visit: Payer: BC Managed Care – PPO | Admitting: Internal Medicine

## 2015-01-05 ENCOUNTER — Other Ambulatory Visit: Payer: BC Managed Care – PPO

## 2015-01-05 ENCOUNTER — Ambulatory Visit: Payer: BC Managed Care – PPO

## 2015-01-05 NOTE — Telephone Encounter (Signed)
Pt called -"i am to sick to come in weak, vomiting, I don't think I can get in care". She is at [facility]. I instructed pt to call [phone] to go to ED and she said she will call nurse at at [facility] facility to call 911 for her.

## 2015-01-05 NOTE — Telephone Encounter (Signed)
s.w. pt and cx todays appt she is sick

## 2015-01-06 ENCOUNTER — Telehealth: Payer: Self-pay | Admitting: Medical Oncology

## 2015-01-06 NOTE — Telephone Encounter (Signed)
Pt did not go to ED yesterday . She stated she received fluids at [facility]. "When does Mohamed want to see me back to go over CT scan. Note to [place].

## 2015-01-09 ENCOUNTER — Telehealth: Payer: Self-pay | Admitting: *Deleted

## 2015-01-09 ENCOUNTER — Telehealth: Payer: Self-pay | Admitting: Internal Medicine

## 2015-01-09 NOTE — Telephone Encounter (Signed)
Called pt discussed  P[t has a f/u appt with MD on Oct 13 at 8 labs with MD, Infusion to follow. Pt advised she cannot get here that early ( 8am) as she is at [employer]. Pt transferred to scheduling per pt request to discuss other appt times.

## 2015-01-09 NOTE — Telephone Encounter (Signed)
returned call and s.w. pt and r/s appt to later time....pt ok and aware of new appts

## 2015-01-10 ENCOUNTER — Telehealth: Payer: Self-pay | Admitting: *Deleted

## 2015-01-10 NOTE — Telephone Encounter (Signed)
Pt states she is now living at [facility] and it is difficult to get in for appts. Is requesting Dr Julien Nordmann call her with the results of the CT scan once that is complete, rather than face to face.

## 2015-01-11 NOTE — Telephone Encounter (Signed)
Per MD, pt CT scan showing progression she will need to come in and discuss options and treatment going forward. Pt re-verbalized " my scan is worse? Okay I'll come in to see him."  Gave appt date time of 10/13 11am. Pt confirmed she will be here to discuss options.

## 2015-01-11 NOTE — Telephone Encounter (Signed)
Call received from patient's "sister Sherri Stafford calling on patient's behalf for CT scan results.  She's called five times and this is riduculous."  Denies computer access for patient portal at [facility] assisted [facility].  Advised results will be discussed at next F/U 01-26-2015.  "She's too weak to come in there and too weak to keep making calls.  Scans were done two weeks ago in the hospital.  What do I need to do to get her these results.  I can put her on the phone.  She needs these results to make important care decisions.  Her insurance will not pay after tomorrow." Dr. Julien Nordmann with patients.  Called collaborative nurse who will discuss with him.  Explained to Sherri Stafford on phone at this time.  Sherri Stafford confirmed she has to decide to pay out of pocket for Rehab or go home pending scan results.  Return number for Sherri Stafford is [phone].

## 2015-01-12 ENCOUNTER — Other Ambulatory Visit: Payer: BC Managed Care – PPO

## 2015-01-12 ENCOUNTER — Ambulatory Visit: Payer: BC Managed Care – PPO

## 2015-01-18 ENCOUNTER — Telehealth: Payer: Self-pay | Admitting: Medical Oncology

## 2015-01-18 ENCOUNTER — Telehealth: Payer: Self-pay | Admitting: Internal Medicine

## 2015-01-18 NOTE — Telephone Encounter (Signed)
Sherri Stafford reports pt is feeling overwhelmed. Appts confirmed. Pt told her she is wearing diaper because staff is not getting her up. She resides at [facility].

## 2015-01-18 NOTE — Telephone Encounter (Signed)
returned call and s.w pt and confirmed appts....pt ok and aware °

## 2015-01-19 ENCOUNTER — Other Ambulatory Visit: Payer: BC Managed Care – PPO

## 2015-01-19 ENCOUNTER — Telehealth: Payer: Self-pay | Admitting: Medical Oncology

## 2015-01-19 NOTE — Telephone Encounter (Signed)
I spoke to nurse at [facility]. Pt is wearing diapers at her request because she said the staff don't answer her call Sherri Stafford in time to go to bathroom. Pt did shower at nurses urging but is refusing to get out of bed and get dressed. Pt confirmed appt for next week.

## 2015-01-26 ENCOUNTER — Ambulatory Visit (HOSPITAL_BASED_OUTPATIENT_CLINIC_OR_DEPARTMENT_OTHER): Payer: BC Managed Care – PPO

## 2015-01-26 ENCOUNTER — Other Ambulatory Visit: Payer: Self-pay | Admitting: *Deleted

## 2015-01-26 ENCOUNTER — Ambulatory Visit: Payer: BC Managed Care – PPO | Admitting: Internal Medicine

## 2015-01-26 ENCOUNTER — Ambulatory Visit: Payer: BC Managed Care – PPO

## 2015-01-26 ENCOUNTER — Other Ambulatory Visit: Payer: BC Managed Care – PPO

## 2015-01-26 ENCOUNTER — Encounter: Payer: Self-pay | Admitting: Internal Medicine

## 2015-01-26 ENCOUNTER — Ambulatory Visit (HOSPITAL_BASED_OUTPATIENT_CLINIC_OR_DEPARTMENT_OTHER): Payer: BC Managed Care – PPO | Admitting: Internal Medicine

## 2015-01-26 ENCOUNTER — Other Ambulatory Visit (HOSPITAL_BASED_OUTPATIENT_CLINIC_OR_DEPARTMENT_OTHER): Payer: BC Managed Care – PPO

## 2015-01-26 VITALS — BP 96/72 | HR 99 | Temp 98.3°F | Resp 18 | Ht 63.0 in | Wt 106.9 lb

## 2015-01-26 DIAGNOSIS — C787 Secondary malignant neoplasm of liver and intrahepatic bile duct: Secondary | ICD-10-CM

## 2015-01-26 DIAGNOSIS — C7951 Secondary malignant neoplasm of bone: Secondary | ICD-10-CM | POA: Diagnosis not present

## 2015-01-26 DIAGNOSIS — C3432 Malignant neoplasm of lower lobe, left bronchus or lung: Secondary | ICD-10-CM | POA: Diagnosis not present

## 2015-01-26 DIAGNOSIS — C3492 Malignant neoplasm of unspecified part of left bronchus or lung: Secondary | ICD-10-CM

## 2015-01-26 DIAGNOSIS — G893 Neoplasm related pain (acute) (chronic): Secondary | ICD-10-CM | POA: Diagnosis not present

## 2015-01-26 DIAGNOSIS — C3490 Malignant neoplasm of unspecified part of unspecified bronchus or lung: Secondary | ICD-10-CM

## 2015-01-26 DIAGNOSIS — C7989 Secondary malignant neoplasm of other specified sites: Secondary | ICD-10-CM | POA: Diagnosis not present

## 2015-01-26 DIAGNOSIS — Z95828 Presence of other vascular implants and grafts: Secondary | ICD-10-CM

## 2015-01-26 DIAGNOSIS — Z66 Do not resuscitate: Secondary | ICD-10-CM | POA: Insufficient documentation

## 2015-01-26 LAB — CBC WITH DIFFERENTIAL/PLATELET
BASO%: 0.1 % (ref 0.0–2.0)
BASOS ABS: 0 10*3/uL (ref 0.0–0.1)
EOS ABS: 0.4 10*3/uL (ref 0.0–0.5)
EOS%: 2.7 % (ref 0.0–7.0)
HEMATOCRIT: 27.7 % — AB (ref 34.8–46.6)
HEMOGLOBIN: 8.7 g/dL — AB (ref 11.6–15.9)
LYMPH#: 0.5 10*3/uL — AB (ref 0.9–3.3)
LYMPH%: 3.4 % — ABNORMAL LOW (ref 14.0–49.7)
MCH: 29 pg (ref 25.1–34.0)
MCHC: 31.4 g/dL — ABNORMAL LOW (ref 31.5–36.0)
MCV: 92.3 fL (ref 79.5–101.0)
MONO#: 0.6 10*3/uL (ref 0.1–0.9)
MONO%: 4.1 % (ref 0.0–14.0)
NEUT%: 89.7 % — ABNORMAL HIGH (ref 38.4–76.8)
NEUTROS ABS: 13.5 10*3/uL — AB (ref 1.5–6.5)
Platelets: 187 10*3/uL (ref 145–400)
RBC: 3 10*6/uL — ABNORMAL LOW (ref 3.70–5.45)
RDW: 24.8 % — AB (ref 11.2–14.5)
WBC: 15.1 10*3/uL — AB (ref 3.9–10.3)

## 2015-01-26 LAB — COMPREHENSIVE METABOLIC PANEL (CC13)
ALBUMIN: 2.5 g/dL — AB (ref 3.5–5.0)
ALK PHOS: 101 U/L (ref 40–150)
ALT: 12 U/L (ref 0–55)
AST: 9 U/L (ref 5–34)
Anion Gap: 6 mEq/L (ref 3–11)
BILIRUBIN TOTAL: 1.19 mg/dL (ref 0.20–1.20)
BUN: 9 mg/dL (ref 7.0–26.0)
CALCIUM: 7.8 mg/dL — AB (ref 8.4–10.4)
CO2: 22 mEq/L (ref 22–29)
CREATININE: 0.6 mg/dL (ref 0.6–1.1)
Chloride: 106 mEq/L (ref 98–109)
EGFR: >90 mL/min/{1.73_m2} (ref >90)
GLUCOSE: 90 mg/dL (ref 70–140)
POTASSIUM: 3.8 meq/L (ref 3.5–5.1)
SODIUM: 134 meq/L — AB (ref 136–145)
TOTAL PROTEIN: 5.5 g/dL — AB (ref 6.4–8.3)

## 2015-01-26 MED ORDER — MORPHINE SULFATE (PF) 4 MG/ML IV SOLN
INTRAVENOUS | Status: AC
  Filled 2015-01-26: qty 1

## 2015-01-26 MED ORDER — HEPARIN SOD (PORK) LOCK FLUSH 100 UNIT/ML IV SOLN
500.0000 [IU] | Freq: Once | INTRAVENOUS | Status: AC
  Administered 2015-01-26: 500 [IU] via INTRAVENOUS
  Filled 2015-01-26: qty 5

## 2015-01-26 MED ORDER — SODIUM CHLORIDE 0.9 % IJ SOLN
10.0000 mL | INTRAMUSCULAR | Status: DC | PRN
  Administered 2015-01-26: 10 mL via INTRAVENOUS
  Filled 2015-01-26: qty 10

## 2015-01-26 MED ORDER — MORPHINE SULFATE 4 MG/ML IJ SOLN
2.0000 mg | Freq: Once | INTRAMUSCULAR | Status: AC
  Administered 2015-01-26: 2 mg via INTRAMUSCULAR
  Filled 2015-01-26: qty 1

## 2015-01-26 NOTE — Progress Notes (Signed)
[place] [phone]   [phone]  OFFICE PROGRESS NOTE  [name], MD [place] [address]  DIAGNOSIS: stage IV (T2a, N3, M1b) non-small cell lung cancer, adenocarcinoma presented with large left lower lobe lung mass in addition to bilateral pulmonary nodules and mediastinal and supraclavicular lymphadenopathy diagnosed in May of 2015.  Genomic Alterations Identified? ERBB3 amplification CDK4 amplification IDH2 R172S KRAS G12D MDM2 amplification RBM10 [id number] TERC amplification - equivocal? Additional Disease-relevant Genes with No Reportable Alterations Identified? RET ALK BRAF ERBB2 MET EGFR  PRIOR THERAPY:  1) status post palliative [facility] to the left [facility]  status post stereotactic radiotherapy to brain lesions [place] the [place] 2) Systemic chemotherapy with carboplatin for AUC of 5, Alimta 500 mg/M2 and Avastin 15 mg/KG every 3 weeks, status post 5 cycles. [facility] 3) immunotherapy with Nivolmab at 3 mg/kg given every 3 weeks. Status post 16 cycles discontinued today secondary to disease progression. 4)  Gilotrif 40 mg by mouth daily for a patient with positive ERBB3 amplification. Therapy starting 08/29/2014. The dose was reduced to 30 mg every other day secondary to interval recurrence. Status post approximately 2 month of therapy. Discontinued today secondary to disease progression. 5) systemic chemotherapy with single agent gemcitabine 1000 MG/M2 on days 1 and 8 every 3 weeks status post 2 cycles, discontinued secondary to disease progression. 6) Xgeva 120 mcg subcutaneously every 2 months.   CURRENT THERAPY: Palliative and hospice care.   INTERVAL  HISTORY: Sherri KUTSCH64y.o. female returns to the clinic today for follow-up visit accompanied by her boyfriend and one of her friends. The patient completed 2 cycles of systemic chemotherapy with single agent gemcitabine and tolerated it fairly well except for fatigue and pancytopenia. [facility] pneumonia as well as severe dehydration and deconditioning. The patient was discharged to a skilled nursing facility. She continues to complain of increasing fatigue and weakness as well as pain in the left upper quadrant of the abdomen. She also has lack of appetite. She has no significant weight loss recently. She denied having any significant chest pain, shortness of breath, cough or hemoptysis. The patient denied having any current fever or chills. During her hospitalization she had repeat CT scan of the chest, abdomen and pelvis and she is here today for evaluation and discussion of her scan results and treatment options..Marland Kitchen MEDICAL HISTORY: Past Medical History  Diagnosis Date  . Arthritis   . Bilateral ovarian cysts   . GERD (gastroesophageal reflux disease)   . Strain of hip flexor 06/2013    left side torn  . Radiation     at [facility] to left hip, sacrum and brain  . Radiation 02/10/14-03/06/14    left central chest 35 gray  . Encounter for antineoplastic chemotherapy 09/19/2014  . [place]  . [place]     to [place] hip and spine  . Radiation 10/12/14-10/26/14    pelvis region 20 gray    ALLERGIES:  is allergic to other; ketoconazole; lorazepam; morphine and related; nystatin; sporanox; tylenol; vitamin d; and zofran.  MEDICATIONS:  Current Outpatient Prescriptions  Medication Sig Dispense Refill  .  albuterol (PROVENTIL HFA;VENTOLIN HFA) 108 (90 BASE) MCG/ACT inhaler Inhale 1-2 puffs into the lungs every 6 (six) hours as needed for wheezing or shortness of breath. 1 Inhaler 2  . bisacodyl (DULCOLAX)  10 MG suppository Place 1 suppository (10 mg total) rectally daily as needed for moderate constipation. 10 suppository 0  . clindamycin (CLEOCIN-T) 1 % external solution Apply topically 2 (two) times daily. 30 mL 0  . dexamethasone (DECADRON) 2 MG tablet     . dexamethasone (DECADRON) 4 MG tablet May take 1/2 to 1 tab PO BID PRN nausea. 30 tablet 1  . diphenoxylate-atropine (LOMOTIL) 2.5-0.025 MG per tablet Take 2 tablets by mouth 4 (four) times daily as needed for diarrhea or loose stools. 30 tablet 0  . doxycycline (VIBRA-TABS) 100 MG tablet Take 1 tablet (100 mg total) by mouth every 12 (twelve) hours. 4 tablet 0  . dronabinol (MARINOL) 2.5 MG capsule Take 1 capsule (2.5 mg total) by mouth 2 (two) times daily before a meal. 60 capsule 0  . esomeprazole (NEXIUM) 20 MG capsule     . fentaNYL (DURAGESIC - DOSED MCG/HR) 50 MCG/HR Place 1 patch (50 mcg total) onto the skin every 3 (three) days. 10 patch 0  . levothyroxine (SYNTHROID, LEVOTHROID) 25 MCG tablet     . magnesium oxide (MAG-OX) 400 (241.3 MG) MG tablet Take 1 tablet (400 mg total) by mouth 2 (two) times daily. 40 tablet 0  . OLANZapine zydis (ZYPREXA ZYDIS) 10 MG disintegrating tablet For nausea (Patient taking differently: Take 10 mg by mouth at bedtime as needed ([facility]). ) 30 tablet 0  . potassium chloride 20 MEQ/15ML (10%) SOLN     . potassium chloride SA (K-DUR,KLOR-CON) 20 MEQ tablet Take 1 tablet (20 mEq total) by mouth 2 (two) times daily. 60 tablet 0  . prochlorperazine (COMPAZINE) 10 MG tablet Take 1 tablet (10 mg total) by mouth every 6 (six) hours as needed for nausea or vomiting. 45 tablet 2  . rivaroxaban (XARELTO) 20 MG TABS tablet Take 1 tablet (20 mg total) by mouth daily with supper. 30 tablet 5  . sodium chloride 0.9 % infusion Inject 1,000 mLs into the vein 3 (three) times a week. 1000 mL 0  . sucralfate (CARAFATE) 1 GM/10ML suspension Take 10 mLs (1 g total) by mouth 4 (four) times daily -  with meals and at bedtime.  420 mL 0  . traMADol (ULTRAM) 50 MG tablet Take 1 tablet (50 mg total) by mouth every 6 (six) hours as needed. 60 tablet 0   No current facility-administered medications for this visit.   Facility-Administered Medications Ordered in Other Visits  Medication Dose Route Frequency Provider Last Rate Last Dose  . sodium chloride 0.9 % injection 10 mL  10 mL Intracatheter PRN Curt Bears, MD   10 mL at 04/07/14 1200  . sodium chloride 0.9 % injection 10 mL  10 mL Intravenous PRN Curt Bears, MD   10 mL at 11/16/14 0850  . sodium chloride 0.9 % injection 10 mL  10 mL Intravenous PRN Curt Bears, MD   10 mL at 01/26/15 1106    SURGICAL HISTORY:  Past Surgical History  Procedure Laterality Date  . Dilation and curettage of uterus  2004    x 3   . Tubal ligation    . Colonoscopy  04/05/2004    normal     REVIEW OF SYSTEMS:  Constitutional: positive for anorexia and fatigue Eyes: negative Ears, nose, mouth, throat, and face: negative  Respiratory: positive for dyspnea on exertion Cardiovascular: negative Gastrointestinal: positive for abdominal pain Genitourinary:negative Integument/breast: negative Hematologic/lymphatic: negative Musculoskeletal:positive for muscle weakness Neurological: negative Behavioral/Psych: negative Endocrine: negative Allergic/Immunologic: negative   PHYSICAL EXAMINATION: General appearance: alert, cooperative, fatigued and no distress Head: Normocephalic, without obvious abnormality, atraumatic Neck: no adenopathy, no JVD, supple, symmetrical, trachea midline and thyroid not enlarged, symmetric, no tenderness/mass/nodules Lymph nodes: Cervical, supraclavicular, and axillary nodes normal. Resp: clear to auscultation bilaterally Back: symmetric, no curvature. ROM normal. No CVA tenderness. Cardio: regular rate and rhythm, S1, S2 normal, no murmur, click, rub or gallop GI: soft, non-tender; bowel sounds normal; no masses,  no  organomegaly Extremities: extremities normal, atraumatic, no cyanosis or edema Neurologic: Alert and oriented X 3, normal strength and tone. Normal symmetric reflexes. Normal coordination and gait  ECOG PERFORMANCE STATUS: 3 - Symptomatic, >50% confined to bed  Blood pressure 96/72, pulse 99, temperature 98.3 F (36.8 C), temperature source Oral, resp. rate 18, height '5\' 3"'  (1.6 m), weight 106 lb 14.4 oz (48.49 kg), last menstrual period 01/14/2008, SpO2 100 %.  LABORATORY DATA: Lab Results  Component Value Date   WBC 15.1* 01/26/2015   HGB 8.7* 01/26/2015   HCT 27.7* 01/26/2015   MCV 92.3 01/26/2015   PLT 187 01/26/2015      Chemistry      Component Value Date/Time   NA 134* 01/26/2015 1050   NA 135 01/03/2015 0445   K 3.8 01/26/2015 1050   K 3.1* 01/03/2015 0445   CL 107 01/03/2015 0445   CO2 22 01/26/2015 1050   CO2 19* 01/03/2015 0445   BUN 9.0 01/26/2015 1050   BUN 5* 01/03/2015 0445   CREATININE 0.6 01/26/2015 1050   CREATININE 0.86 01/03/2015 0445   CREATININE 0.83 09/07/2013 1506      Component Value Date/Time   CALCIUM 7.8* 01/26/2015 1050   CALCIUM 6.5* 01/03/2015 0445   ALKPHOS 101 01/26/2015 1050   ALKPHOS 72 01/03/2015 0445   AST 9 01/26/2015 1050   AST 18 01/03/2015 0445   ALT 12 01/26/2015 1050   ALT 15 01/03/2015 0445   BILITOT 1.19 01/26/2015 1050   BILITOT 1.5* 01/03/2015 0445       RADIOGRAPHIC STUDIES: Dg Chest 2 View  01/01/2015  CLINICAL DATA:  Pneumonia, former smoker, current history of left lung cancer EXAM: CHEST  2 VIEW COMPARISON:  12/30/2014 FINDINGS: Trace pleural effusion on the right. Right Port-A-Cath in unchanged position. Mild cardiac enlargement stable. Large left pleural effusion with extensive underlying consolidation. When compared to prior study this is not significantly different. IMPRESSION: Stable large left pleural effusion with underlying consolidation. Electronically Signed   By: Skipper Cliche M.D.   On: 01/01/2015  14:22   Ct Chest W Contrast  01/03/2015  CLINICAL DATA:  History of lung cancer diagnosed in May 2015, metastatic to bone, status post radiation therapy completed in 2015, and chemotherapy. Patient complains of nausea. EXAM: CT CHEST, ABDOMEN, AND PELVIS WITH CONTRAST TECHNIQUE: Multidetector CT imaging of the chest, abdomen and pelvis was performed following the standard protocol during bolus administration of intravenous contrast. CONTRAST:  126m OMNIPAQUE IOHEXOL 300 MG/ML  SOLN COMPARISON:  11/09/2014 FINDINGS: CT CHEST FINDINGS Mediastinum/Lymph Nodes: There is a right pretracheal and subcarinal lymphadenopathy. Previously noted left supraclavicular lymph node is stable to decreased in size. There is no evidence of axillary lymphadenopathy. The heart is normal in size. There is no evidence of pericardial effusion. Right-sided injectable port terminates within the right atrium. Lungs/Pleura: The left  pleural effusion has increased in size. Again seen is atelectatic left lower lobe with several hypoattenuated lesions, not significantly changed. There are bilateral pulmonary masses, consistent with metastatic disease, which are not significantly changed in size, the largest mass in the left upper lobe measures 1.5 x 1.0 cm. CT ABDOMEN PELVIS FINDINGS Hepatobiliary: There is progression of metastatic disease to liver, with the largest mass in the inferior portion of the right lobe of the liver measuring 4.8 x 3.8 cm. There is interval enlargement in several masses in the dome of the liver as well some of which are now confluent. Pancreas: No mass, inflammatory changes, or other significant abnormality. Bulky retroperitoneal adenopathy is again seen compressing the posterior border of the pancreas. Spleen: There is worsening of the metastatic disease to the spleen as well, with confluent metastatic masses occupying most of the volume of the spleen. There is tumor thrombus extending into the splenic vein. There  are geometric area of hypoattenuation within the superior and inferior aspects of the spleen, which may represent areas of hypoperfusion. Adrenals/Urinary Tract: There is a 9 mm nodule within the lateral leaflet of the right adrenal gland. Stomach/Bowel: No evidence of obstruction, inflammatory process, or abnormal fluid collections. Vascular/Lymphatic: Mild atherosclerotic disease of the aorta. Reproductive: No mass or other significant abnormality. Other: Dependent soft tissue edema. Musculoskeletal: Again noted is skeletal metastatic disease within the spine and pelvis. There is progression of abnormal mixed lytic sclerotic appearance of L3 vertebral body. No evidence of compression fracture. IMPRESSION: Interval increase of left pleural effusion, with left lower lobe collapse. Stable pulmonary metastases. Interval worsening of mediastinal lymphadenopathy. Interval worsening of liver metastatic disease. Interval worsening of splenic metastatic disease with bulky splenic vein tumor thrombosis, and secondary hypoperfusion changes within the spleen. Stable retroperitoneal lymphadenopathy. Stable skeletal metastatic disease. No evidence of compression fractures. These results were called by telephone at the time of interpretation on 01/03/2015 at 7:48 am to Gearldine Bienenstock, RN, answering on behalf of Dr. Nita Sells , who verbally acknowledged these results. Electronically Signed   By: Fidela Salisbury M.D.   On: 01/03/2015 07:58   Ct Abdomen Pelvis W Contrast  01/03/2015  CLINICAL DATA:  History of lung cancer diagnosed in May 2015, metastatic to bone, status post radiation therapy completed in 2015, and chemotherapy. Patient complains of nausea. EXAM: CT CHEST, ABDOMEN, AND PELVIS WITH CONTRAST TECHNIQUE: Multidetector CT imaging of the chest, abdomen and pelvis was performed following the standard protocol during bolus administration of intravenous contrast. CONTRAST:  141m OMNIPAQUE IOHEXOL 300  MG/ML  SOLN COMPARISON:  11/09/2014 FINDINGS: CT CHEST FINDINGS Mediastinum/Lymph Nodes: There is a right pretracheal and subcarinal lymphadenopathy. Previously noted left supraclavicular lymph node is stable to decreased in size. There is no evidence of axillary lymphadenopathy. The heart is normal in size. There is no evidence of pericardial effusion. Right-sided injectable port terminates within the right atrium. Lungs/Pleura: The left pleural effusion has increased in size. Again seen is atelectatic left lower lobe with several hypoattenuated lesions, not significantly changed. There are bilateral pulmonary masses, consistent with metastatic disease, which are not significantly changed in size, the largest mass in the left upper lobe measures 1.5 x 1.0 cm. CT ABDOMEN PELVIS FINDINGS Hepatobiliary: There is progression of metastatic disease to liver, with the largest mass in the inferior portion of the right lobe of the liver measuring 4.8 x 3.8 cm. There is interval enlargement in several masses in the dome of the liver as well some of which are  now confluent. Pancreas: No mass, inflammatory changes, or other significant abnormality. Bulky retroperitoneal adenopathy is again seen compressing the posterior border of the pancreas. Spleen: There is worsening of the metastatic disease to the spleen as well, with confluent metastatic masses occupying most of the volume of the spleen. There is tumor thrombus extending into the splenic vein. There are geometric area of hypoattenuation within the superior and inferior aspects of the spleen, which may represent areas of hypoperfusion. Adrenals/Urinary Tract: There is a 9 mm nodule within the lateral leaflet of the right adrenal gland. Stomach/Bowel: No evidence of obstruction, inflammatory process, or abnormal fluid collections. Vascular/Lymphatic: Mild atherosclerotic disease of the aorta. Reproductive: No mass or other significant abnormality. Other: Dependent soft  tissue edema. Musculoskeletal: Again noted is skeletal metastatic disease within the spine and pelvis. There is progression of abnormal mixed lytic sclerotic appearance of L3 vertebral body. No evidence of compression fracture. IMPRESSION: Interval increase of left pleural effusion, with left lower lobe collapse. Stable pulmonary metastases. Interval worsening of mediastinal lymphadenopathy. Interval worsening of liver metastatic disease. Interval worsening of splenic metastatic disease with bulky splenic vein tumor thrombosis, and secondary hypoperfusion changes within the spleen. Stable retroperitoneal lymphadenopathy. Stable skeletal metastatic disease. No evidence of compression fractures. These results were called by telephone at the time of interpretation on 01/03/2015 at 7:48 am to Gearldine Bienenstock, RN, answering on behalf of Dr. Nita Sells , who verbally acknowledged these results. Electronically Signed   By: Fidela Salisbury M.D.   On: 01/03/2015 07:58   Dg Chest Port 1 View  12/30/2014  CLINICAL DATA:  Respiratory failure EXAM: PORTABLE CHEST - 1 VIEW COMPARISON:  December 29, 2014 chest radiograph and chest CT November 09, 2014 FINDINGS: Power Port-A-Cath present with tip just beyond the cavoatrial junction. No pneumothorax. Extensive consolidation throughout the left lower lobe and lingula remains. There is a questionable superimposed left effusion. Pulmonary nodular lesions are present, primarily on the left, better seen on CT than radiography. Heart size is within normal limits. Pulmonary vascularity is normal. No adenopathy. IMPRESSION: Extensive consolidation throughout the lingula and left lower lobe. Suspect left effusion. Pulmonary nodular lesions consistent with metastatic foci present but better seen on CT. There is overall no appreciable change compared to 1 day prior. Electronically Signed   By: Lowella Grip III M.D.   On: 12/30/2014 07:55   Dg Chest Port 1 View  12/29/2014   CLINICAL DATA:  Respiratory failure. EXAM: PORTABLE CHEST - 1 VIEW COMPARISON:  12/28/2014.  CT 11/08/2014. FINDINGS: Power port catheter noted in stable position. Heart size stable. Persistent left mid lung and left lower lobe infiltrate and pleural effusion. Left lower lobe mass and pulmonary metastatic nodules better demonstrated by prior recent CT. No pneumothorax. No acute bony abnormality . IMPRESSION: 1. Power port in stable position. 2. Persistent left mid lung and left lower lobe infiltrate and left pleural effusion. No interim improvement. 3. Left lower lobe pulmonary mass and bilateral metastatic pulmonary nodules better demonstrated by recent CT. Electronically Signed   By: Marcello Moores  Register   On: 12/29/2014 07:22   Dg Chest Port 1 View  12/28/2014  CLINICAL DATA:  Acute respiratory failure.  Shortness of breath. EXAM: PORTABLE CHEST - 1 VIEW COMPARISON:  04/26/2014. FINDINGS: Cardiopericardial silhouette appears unchanged. RIGHT IJ power port is also unchanged. LEFT lower lobe collapse/ consider set LEFT basilar collapse/ consolidation remains present, without interval change. There is a LEFT pleural effusion. Pulmonary nodules are suboptimally visualized due to portable technique.  IMPRESSION: No interval change with LEFT pleural effusion and LEFT mid and lower lung collapse/consolidation. Electronically Signed   By: Dereck Ligas M.D.   On: 12/28/2014 07:01    ASSESSMENT AND PLAN: This is a very pleasant 64 years old white female with metastatic non-small cell lung cancer, adenocarcinoma status post several treatment regimen including carboplatin, Alimta and Avastin followed by treatment with Nivolumab for 16 cycles and most recently treated with oral Gilotrif but unfortunately has evidence for disease progression especially in the spleen and liver. The patient underwent systemic chemotherapy with single agent gemcitabine status post 2 cycle but this was discontinued today secondary to  significant disease progression involving the mediastinal lymph nodes, liver and spleen.. I had a lengthy discussion with the patient and her family today about her current condition and treatment options. Unfortunately her condition has declined significantly. I strongly recommended for the patient to discontinue all forms of treatment at this point and to consider palliative care and hospice for end-of-life care. The patient her family agreed to this option. She may benefit from admission to [facility] for end-of-life care. For pain management the patient will continue on fentanyl patch 50 g/hour every 3 days. She was also given a dose of morphine sulfate 2 mg intramuscular in the clinic today. CODE STATUS was discussed with the patient and her family and she is no CODE BLUE. I will see the patient on as-needed basis at this point. The patient was advised to call immediately if she has any concerning symptoms.  The patient voices understanding of current disease status and treatment options and is in agreement with the current care plan.  All questions were answered. The patient knows to call the clinic with any problems, questions or concerns. We can certainly see the patient much sooner if necessary.  Disclaimer: This note was dictated with voice recognition software. Similar sounding words can inadvertently be transcribed and may not be corrected upon review.

## 2015-01-30 ENCOUNTER — Encounter: Payer: Self-pay | Admitting: *Deleted

## 2015-01-30 NOTE — Progress Notes (Signed)
Oncology Nurse Navigator Documentation  Oncology Nurse Navigator Flowsheets 01/30/2015  Navigator Encounter Type Telephone/Ms. Cathy Deal called and would like updated information on patient.  She is on HIPPA form noted on 12/18/14.  I called and updated her and referred to Dr. Worthy Flank note on 01/26/15.  She was thankful for the help.    Time Spent with Patient 30

## 2015-02-01 ENCOUNTER — Telehealth: Payer: Self-pay | Admitting: Internal Medicine

## 2015-02-01 ENCOUNTER — Other Ambulatory Visit: Payer: Self-pay | Admitting: Medical Oncology

## 2015-02-01 NOTE — Telephone Encounter (Signed)
Called and left a message with flush appointment

## 2015-02-02 ENCOUNTER — Other Ambulatory Visit: Payer: BC Managed Care – PPO

## 2015-02-02 ENCOUNTER — Ambulatory Visit: Payer: BC Managed Care – PPO

## 2015-02-09 ENCOUNTER — Other Ambulatory Visit: Payer: BC Managed Care – PPO

## 2015-02-14 DEATH — deceased

## 2015-02-16 ENCOUNTER — Other Ambulatory Visit: Payer: BC Managed Care – PPO

## 2015-02-16 ENCOUNTER — Ambulatory Visit: Payer: BC Managed Care – PPO

## 2015-02-16 ENCOUNTER — Ambulatory Visit: Payer: BC Managed Care – PPO | Admitting: Internal Medicine

## 2015-02-23 ENCOUNTER — Ambulatory Visit: Payer: BC Managed Care – PPO

## 2015-03-08 ENCOUNTER — Other Ambulatory Visit: Payer: BC Managed Care – PPO

## 2015-03-08 ENCOUNTER — Ambulatory Visit: Payer: BC Managed Care – PPO | Admitting: Internal Medicine

## 2015-03-08 ENCOUNTER — Ambulatory Visit: Payer: BC Managed Care – PPO

## 2015-03-15 ENCOUNTER — Ambulatory Visit: Payer: BC Managed Care – PPO

## 2015-07-19 ENCOUNTER — Telehealth: Payer: Self-pay | Admitting: Nurse Practitioner

## 2015-07-19 NOTE — Telephone Encounter (Signed)
Per staff message:   From: Salome Arnt   Sent: 07/14/2015 11:48 AM    To: Kem Boroughs, FNP   Patient's husband Latanya Presser came to office after receiving an AEX recall letter for patient to let us know that patient passed away in 01-26-23 from lung cancer. No appointments in system and she has been taken out of recall. The number above is Mr.Faulk's number. Chart on your desk.   07/19/15--I further confirmed patient is deceased by viewing her obituary. Will send for scanning. Patient flag changed to deceased as of 02/10/2015.   Routing to Kem Boroughs, FNP for review.

## 2015-10-02 ENCOUNTER — Other Ambulatory Visit: Payer: Self-pay | Admitting: Nurse Practitioner

## 2016-06-11 IMAGING — CT CT ABD-PELV W/ CM
2 of 5 series · 14 of 46 positions shown, 16 images · IV contrast (OMNIPAQUE)
Comparison: CT of abdomen and pelvis 12/31/2013. PET-CT 09/09/2013.

CLINICAL DATA: 63-year-old female with history of left-sided lung
cancer and bone metastasis diagnosed in 3145. Chemotherapy in
progress.

EXAM:
CT CHEST, ABDOMEN, AND PELVIS WITH CONTRAST
TECHNIQUE: Multidetector CT imaging of the chest, abdomen and pelvis was
performed following the standard protocol during bolus
administration of intravenous contrast.
CONTRAST:  100mL OMNIPAQUE IOHEXOL 300 MG/ML  SOLN

[Series 2: cap with st · axial · 0.66mm/px · z∈[-488,+68]mm · 11 of 125 slices shown, 13 images]
[im 7/125  soft-tissue]
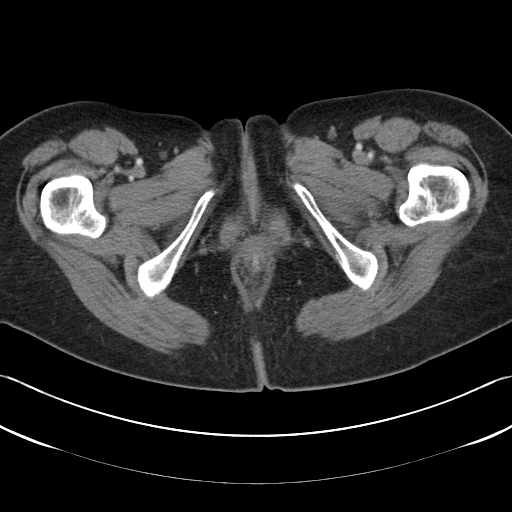
[im 7/125  bone]
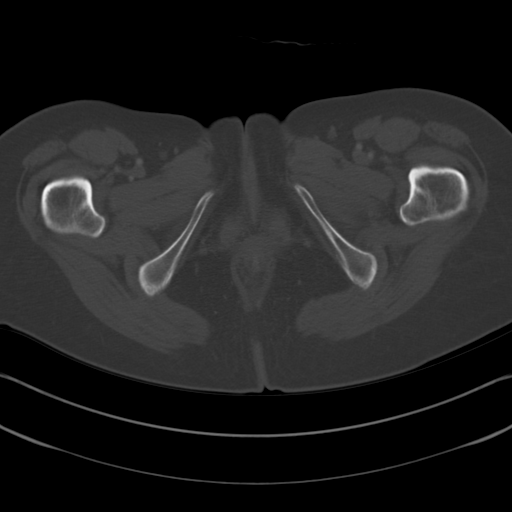
[im 21/125  soft-tissue]
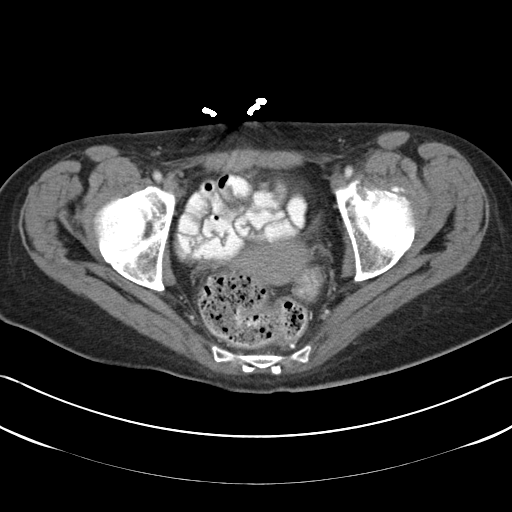
[im 28/125  soft-tissue]
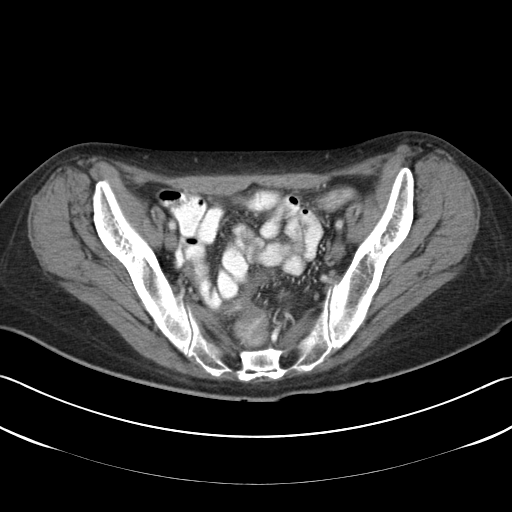
[im 42/125  soft-tissue]
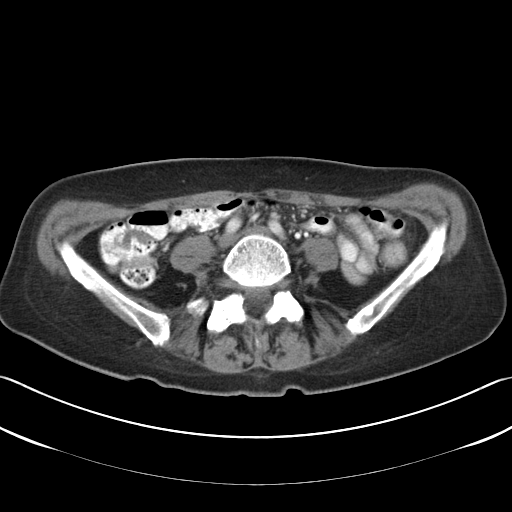
[im 49/125  soft-tissue]
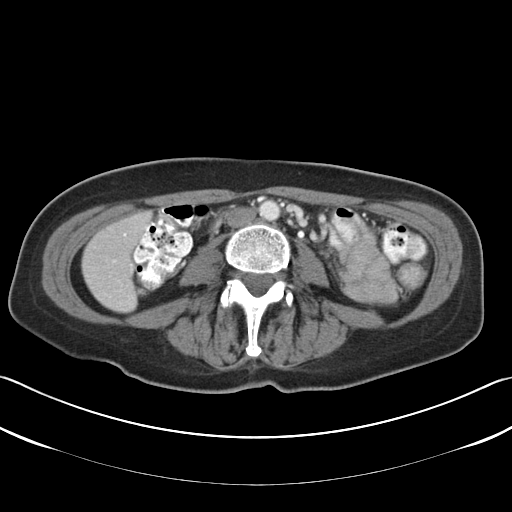
[im 63/125  soft-tissue]
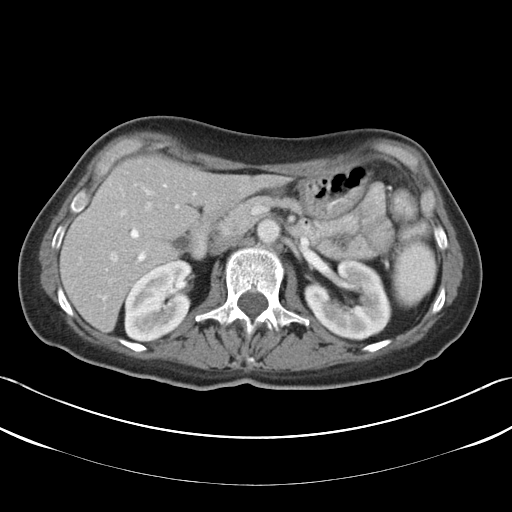
[im 76/125  soft-tissue]
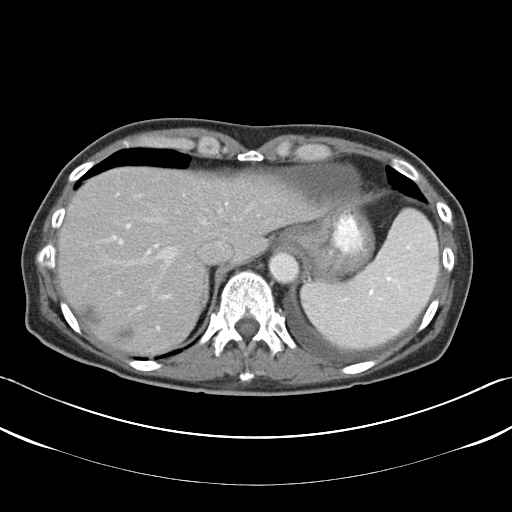
[im 83/125  soft-tissue]
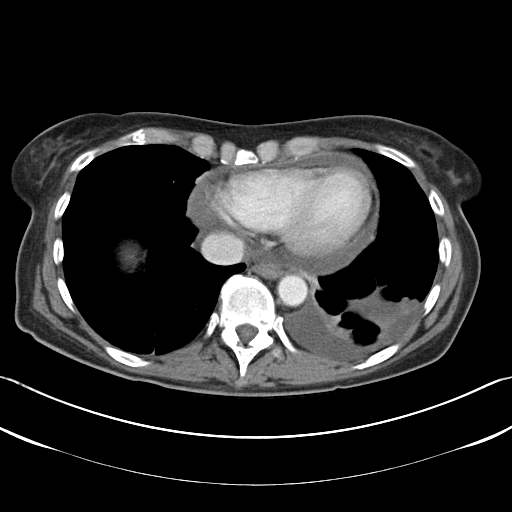
[im 97/125  soft-tissue]
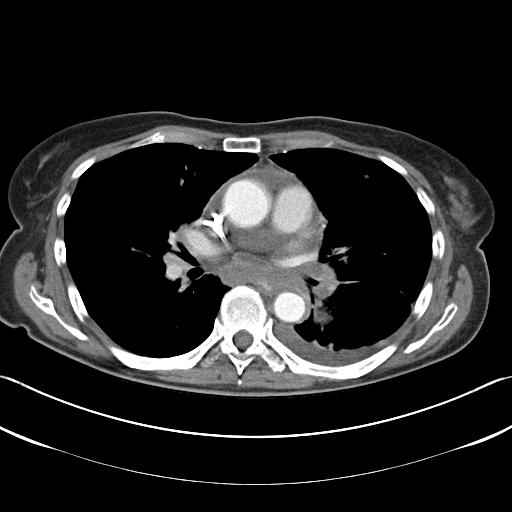
[im 97/125  bone]
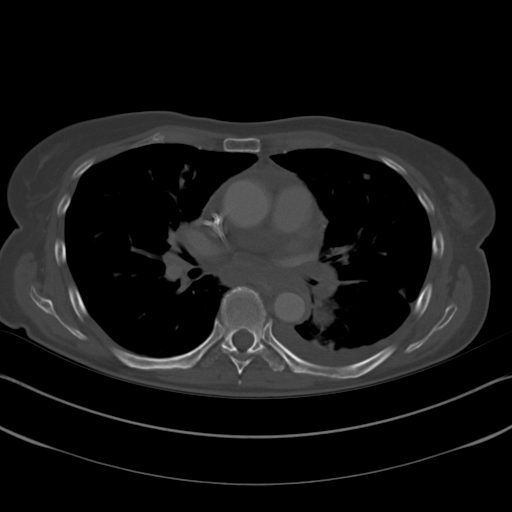
[im 104/125  soft-tissue]
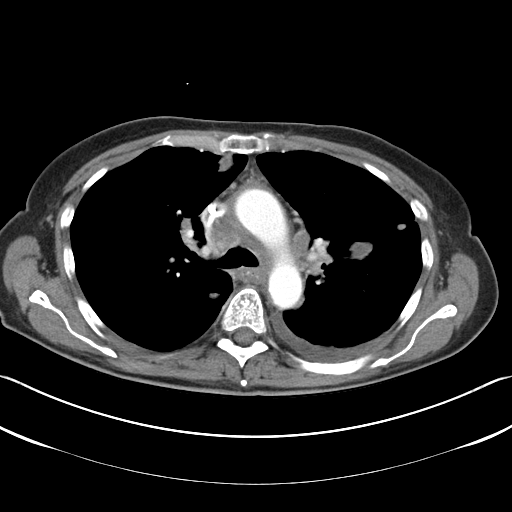
[im 118/125  soft-tissue]
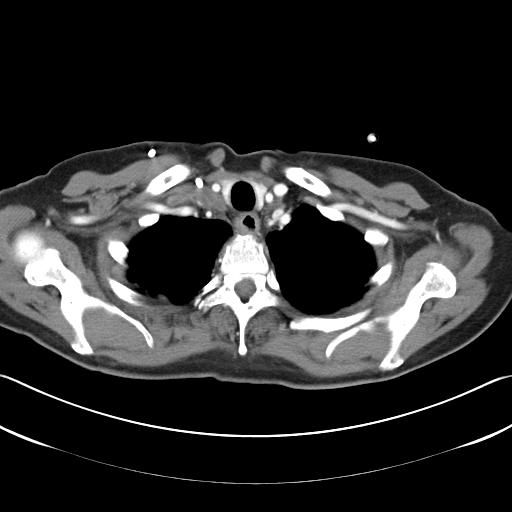

[Series 602: <mpr thick range> · coronal · 1.22mm/px · 3 of 61 slices shown]
[im 21/61  soft-tissue]
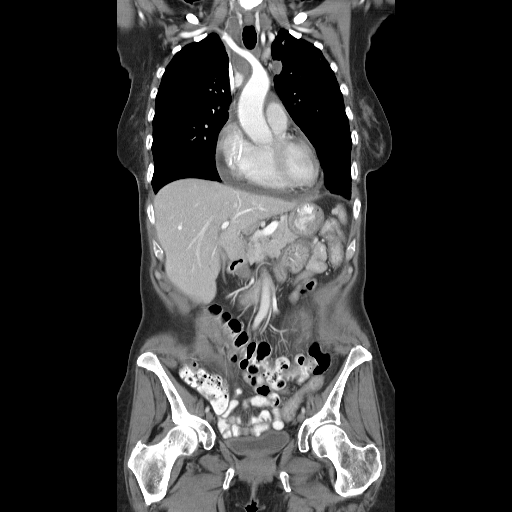
[im 27/61  soft-tissue]
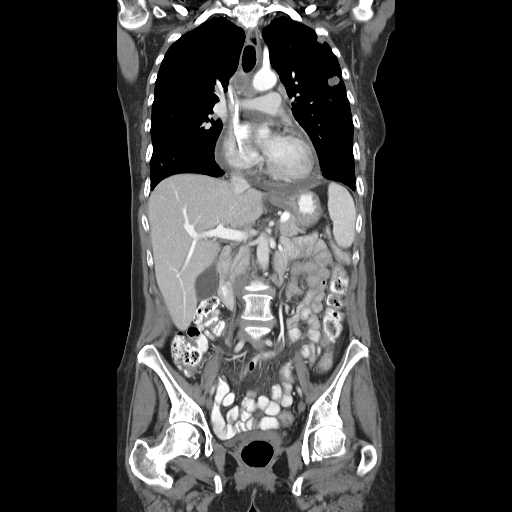
[im 34/61  soft-tissue]
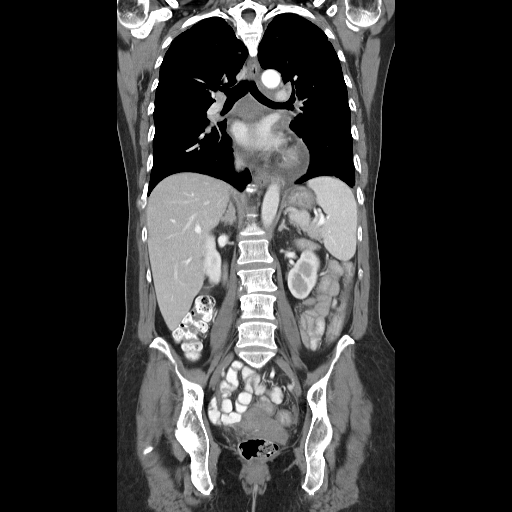

[14 of 46 positions shown; findings below may reference images not displayed]

FINDINGS: CT CHEST FINDINGS

Mediastinum: Compared to prior examinations, the extensive
mediastinal and hilar lymphadenopathy noted on the prior study has
decreased. Specific examples include a 2 cm short axis low right
paratracheal lymph node (previously 2.4 cm), and a 3.5 x 2.0 cm
subcarinal nodal mass (previously 3.9 x 2.4 cm). Bulky left hilar
and infrahilar lymphadenopathy has also significantly decreased,
best appreciated on image 30 of series 2, but is irregular in shape
and therefore difficult to measure. Heart size is normal. Small
volume of pericardial fluid and new pericardial enhancement. This is
unlikely to be of hemodynamic significance at this time. Right-sided
double-lumen internal jugular porta cath with tip terminating in the
right atrium. Esophagus is unremarkable in appearance.

Lungs/Pleura: Innumerable pulmonary nodules and masses are again
noted, generally decreased in size compared to the prior
examination. A notable exception of this is an enlarging 1.5 x
cm nodule in the periphery of the right lower lobe (image 37 of
series 4). Additionally, the largest of the masses in the left lower
lobe appears essentially unchanged compared to the prior
examination, currently measuring 3.9 x 4.0 cm (image 38 of series
4), previously 4.3 x 3.6 cm). Nodules that have decreased in size
compared to the prior examination include a 1.2 x 1.8 cm left upper
lobe nodule (image 22 of series 4), which previously measured 1.9 x
2.2 cm, and a 1.5 x 0.9 cm nodule in the medial aspect of the
anterior right upper lobe (image 22 of series 4), which previously
measured 1.6 x 1.5 cm. Some nodules appear partially cavitary,
including a 1.7 x 1.6 cm nodule in the periphery of the left upper
lobe (image 20 of series 4). Small left pleural effusion appears
slightly increased compared to the prior study, presumably
malignant.

Musculoskeletal: There are no aggressive appearing lytic or blastic
lesions noted in the visualized portions of the skeleton.

CT ABDOMEN AND PELVIS FINDINGS

Hepatobiliary: Previously noted metastatic lesions in the right lobe
of the liver appear slightly less well-defined than the prior study,
with the largest of these measuring 16 x 11 mm in segment 7 on
today's examination (image 50 of series 2), slightly decreased
compared to the prior study. No new hepatic lesions are noted. No
intra or extrahepatic biliary ductal dilatation. Gallbladder is
normal in appearance.

Pancreas: Unremarkable.

Spleen: New 1.0 x 1.3 cm low-attenuation lesion in the medial aspect
of the inferior spleen (image 59 of series 2), concerning for a new
splenic metastasis.

Adrenals/Urinary Tract: Bilateral adrenal glands are normal in
appearance. Bilateral kidneys are normal in appearance. No
hydroureteronephrosis. Urinary bladder is normal in appearance.

Stomach/Bowel: Normal appearance of the stomach. No pathologic
dilatation of small bowel or colon.

Vascular/Lymphatic: Mild atherosclerosis throughout the abdominal
and pelvic vasculature, without evidence of aneurysm or dissection.

Reproductive: Uterus and ovaries are unremarkable in appearance.

Other: No significant volume of ascites.  No pneumoperitoneum.

Musculoskeletal: Again noted are multiple mixed lytic and sclerotic
lesions in the visualized axial and appendicular skeleton. The
largest of these is centered in the sacrum at the level of S1,
currently measuring 3.7 x 2.8 cm.
IMPRESSION: 1. Today's study demonstrates a mixed response to therapy. Overall,
in the chest, the majority of the pulmonary nodules appear smaller
than the prior examination, with some exceptions which are stable to
slightly increased in size, as detailed above. Mediastinal and left
hilar adenopathy has significantly decreased. Metastatic lesions in
the liver are slightly less prominent, and numerous osseous lesions
are generally similar to the prior study, with some interval changes
in size and appearance, which are presumably treatment related. No
definite new osseous lesions noted. There is 1 new lesion in the
spleen measuring 1 x 1.3 cm, which is nonspecific, but concerning
for a new metastatic lesion.
2. Additional incidental findings, as above.
